# Patient Record
Sex: Male | Born: 1970 | Race: White | Hispanic: No | Marital: Married | Smoking: Current every day smoker
Health system: Southern US, Community
[De-identification: ages and names within clinical notes are randomized; demographics above are authoritative.]

## PROBLEM LIST (undated history)

## (undated) DIAGNOSIS — F32A Depression, unspecified: Secondary | ICD-10-CM

## (undated) DIAGNOSIS — T1491XA Suicide attempt, initial encounter: Secondary | ICD-10-CM

## (undated) DIAGNOSIS — Z789 Other specified health status: Secondary | ICD-10-CM

## (undated) DIAGNOSIS — M25569 Pain in unspecified knee: Secondary | ICD-10-CM

## (undated) DIAGNOSIS — J449 Chronic obstructive pulmonary disease, unspecified: Secondary | ICD-10-CM

## (undated) DIAGNOSIS — I499 Cardiac arrhythmia, unspecified: Secondary | ICD-10-CM

## (undated) DIAGNOSIS — M4800 Spinal stenosis, site unspecified: Secondary | ICD-10-CM

## (undated) DIAGNOSIS — F319 Bipolar disorder, unspecified: Secondary | ICD-10-CM

## (undated) DIAGNOSIS — J45909 Unspecified asthma, uncomplicated: Secondary | ICD-10-CM

## (undated) DIAGNOSIS — R001 Bradycardia, unspecified: Secondary | ICD-10-CM

## (undated) DIAGNOSIS — F419 Anxiety disorder, unspecified: Secondary | ICD-10-CM

## (undated) DIAGNOSIS — I82409 Acute embolism and thrombosis of unspecified deep veins of unspecified lower extremity: Secondary | ICD-10-CM

## (undated) DIAGNOSIS — M1990 Unspecified osteoarthritis, unspecified site: Secondary | ICD-10-CM

## (undated) DIAGNOSIS — G4730 Sleep apnea, unspecified: Secondary | ICD-10-CM

## (undated) DIAGNOSIS — I77810 Thoracic aortic ectasia: Secondary | ICD-10-CM

## (undated) DIAGNOSIS — I10 Essential (primary) hypertension: Secondary | ICD-10-CM

## (undated) DIAGNOSIS — G4700 Insomnia, unspecified: Secondary | ICD-10-CM

## (undated) DIAGNOSIS — G8929 Other chronic pain: Secondary | ICD-10-CM

## (undated) DIAGNOSIS — Z87442 Personal history of urinary calculi: Secondary | ICD-10-CM

## (undated) HISTORY — DX: Bradycardia, unspecified: R00.1

## (undated) HISTORY — PX: MULTIPLE TOOTH EXTRACTIONS: SHX2053

## (undated) HISTORY — DX: Insomnia, unspecified: G47.00

## (undated) HISTORY — DX: Thoracic aortic ectasia: I77.810

## (undated) HISTORY — PX: FOOT SURGERY: SHX648

---

## 1999-02-14 ENCOUNTER — Emergency Department (HOSPITAL_COMMUNITY): Admission: EM | Admit: 1999-02-14 | Discharge: 1999-02-14 | Payer: Self-pay | Admitting: Emergency Medicine

## 1999-03-02 ENCOUNTER — Emergency Department (HOSPITAL_COMMUNITY): Admission: EM | Admit: 1999-03-02 | Discharge: 1999-03-02 | Payer: Self-pay | Admitting: Internal Medicine

## 1999-03-02 ENCOUNTER — Encounter: Payer: Self-pay | Admitting: Internal Medicine

## 1999-03-04 ENCOUNTER — Emergency Department (HOSPITAL_COMMUNITY): Admission: EM | Admit: 1999-03-04 | Discharge: 1999-03-04 | Payer: Self-pay | Admitting: Emergency Medicine

## 1999-03-07 ENCOUNTER — Emergency Department (HOSPITAL_COMMUNITY): Admission: EM | Admit: 1999-03-07 | Discharge: 1999-03-07 | Payer: Self-pay | Admitting: Emergency Medicine

## 2001-09-08 ENCOUNTER — Emergency Department (HOSPITAL_COMMUNITY): Admission: EM | Admit: 2001-09-08 | Discharge: 2001-09-08 | Payer: Self-pay | Admitting: Emergency Medicine

## 2002-03-27 ENCOUNTER — Encounter: Payer: Self-pay | Admitting: *Deleted

## 2002-03-27 ENCOUNTER — Emergency Department (HOSPITAL_COMMUNITY): Admission: EM | Admit: 2002-03-27 | Discharge: 2002-03-27 | Payer: Self-pay | Admitting: *Deleted

## 2002-04-27 ENCOUNTER — Emergency Department (HOSPITAL_COMMUNITY): Admission: EM | Admit: 2002-04-27 | Discharge: 2002-04-27 | Payer: Self-pay | Admitting: Emergency Medicine

## 2002-12-03 ENCOUNTER — Emergency Department (HOSPITAL_COMMUNITY): Admission: EM | Admit: 2002-12-03 | Discharge: 2002-12-03 | Payer: Self-pay | Admitting: Emergency Medicine

## 2002-12-22 ENCOUNTER — Emergency Department (HOSPITAL_COMMUNITY): Admission: EM | Admit: 2002-12-22 | Discharge: 2002-12-22 | Payer: Self-pay | Admitting: Emergency Medicine

## 2003-01-05 ENCOUNTER — Encounter: Payer: Self-pay | Admitting: *Deleted

## 2003-01-05 ENCOUNTER — Emergency Department (HOSPITAL_COMMUNITY): Admission: EM | Admit: 2003-01-05 | Discharge: 2003-01-06 | Payer: Self-pay | Admitting: *Deleted

## 2003-03-27 ENCOUNTER — Emergency Department (HOSPITAL_COMMUNITY): Admission: EM | Admit: 2003-03-27 | Discharge: 2003-03-27 | Payer: Self-pay | Admitting: Emergency Medicine

## 2003-04-26 ENCOUNTER — Encounter: Payer: Self-pay | Admitting: *Deleted

## 2003-04-26 ENCOUNTER — Emergency Department (HOSPITAL_COMMUNITY): Admission: EM | Admit: 2003-04-26 | Discharge: 2003-04-26 | Payer: Self-pay | Admitting: *Deleted

## 2003-08-04 ENCOUNTER — Emergency Department (HOSPITAL_COMMUNITY): Admission: EM | Admit: 2003-08-04 | Discharge: 2003-08-04 | Payer: Self-pay | Admitting: Emergency Medicine

## 2003-10-27 ENCOUNTER — Emergency Department (HOSPITAL_COMMUNITY): Admission: EM | Admit: 2003-10-27 | Discharge: 2003-10-27 | Payer: Self-pay | Admitting: Emergency Medicine

## 2003-10-31 ENCOUNTER — Emergency Department (HOSPITAL_COMMUNITY): Admission: EM | Admit: 2003-10-31 | Discharge: 2003-10-31 | Payer: Self-pay | Admitting: Emergency Medicine

## 2004-09-03 ENCOUNTER — Emergency Department (HOSPITAL_COMMUNITY): Admission: EM | Admit: 2004-09-03 | Discharge: 2004-09-03 | Payer: Self-pay | Admitting: Emergency Medicine

## 2005-06-26 ENCOUNTER — Emergency Department (HOSPITAL_COMMUNITY): Admission: EM | Admit: 2005-06-26 | Discharge: 2005-06-26 | Payer: Self-pay | Admitting: Emergency Medicine

## 2005-09-30 ENCOUNTER — Emergency Department (HOSPITAL_COMMUNITY): Admission: EM | Admit: 2005-09-30 | Discharge: 2005-09-30 | Payer: Self-pay | Admitting: Emergency Medicine

## 2005-10-06 ENCOUNTER — Emergency Department (HOSPITAL_COMMUNITY): Admission: EM | Admit: 2005-10-06 | Discharge: 2005-10-06 | Payer: Self-pay | Admitting: Emergency Medicine

## 2008-06-03 ENCOUNTER — Emergency Department (HOSPITAL_COMMUNITY): Admission: EM | Admit: 2008-06-03 | Discharge: 2008-06-03 | Payer: Self-pay | Admitting: Emergency Medicine

## 2008-07-13 ENCOUNTER — Emergency Department (HOSPITAL_COMMUNITY): Admission: EM | Admit: 2008-07-13 | Discharge: 2008-07-13 | Payer: Self-pay | Admitting: Internal Medicine

## 2008-10-28 ENCOUNTER — Emergency Department (HOSPITAL_COMMUNITY): Admission: EM | Admit: 2008-10-28 | Discharge: 2008-10-28 | Payer: Self-pay | Admitting: Emergency Medicine

## 2008-10-29 ENCOUNTER — Inpatient Hospital Stay (HOSPITAL_COMMUNITY): Admission: EM | Admit: 2008-10-29 | Discharge: 2008-11-09 | Payer: Self-pay | Admitting: Emergency Medicine

## 2009-03-17 ENCOUNTER — Emergency Department (HOSPITAL_COMMUNITY): Admission: EM | Admit: 2009-03-17 | Discharge: 2009-03-17 | Payer: Self-pay | Admitting: Emergency Medicine

## 2009-07-16 ENCOUNTER — Emergency Department (HOSPITAL_COMMUNITY): Admission: EM | Admit: 2009-07-16 | Discharge: 2009-07-16 | Payer: Self-pay | Admitting: Emergency Medicine

## 2009-08-01 ENCOUNTER — Encounter (HOSPITAL_COMMUNITY): Admission: RE | Admit: 2009-08-01 | Discharge: 2009-08-31 | Payer: Self-pay | Admitting: Orthopaedic Surgery

## 2009-10-03 ENCOUNTER — Encounter (HOSPITAL_COMMUNITY): Admission: RE | Admit: 2009-10-03 | Discharge: 2009-10-18 | Payer: Self-pay | Admitting: Orthopaedic Surgery

## 2009-12-31 ENCOUNTER — Emergency Department (HOSPITAL_COMMUNITY): Admission: EM | Admit: 2009-12-31 | Discharge: 2009-12-31 | Payer: Self-pay | Admitting: Emergency Medicine

## 2010-01-16 ENCOUNTER — Encounter (HOSPITAL_COMMUNITY): Admission: RE | Admit: 2010-01-16 | Discharge: 2010-02-15 | Payer: Self-pay | Admitting: Orthopaedic Surgery

## 2010-04-05 ENCOUNTER — Ambulatory Visit: Payer: Self-pay | Admitting: Orthopedic Surgery

## 2010-04-05 ENCOUNTER — Ambulatory Visit (HOSPITAL_COMMUNITY): Admission: EM | Admit: 2010-04-05 | Discharge: 2010-04-05 | Payer: Self-pay | Admitting: Emergency Medicine

## 2010-04-17 ENCOUNTER — Encounter (HOSPITAL_COMMUNITY): Admission: RE | Admit: 2010-04-17 | Discharge: 2010-05-17 | Payer: Self-pay | Admitting: Orthopaedic Surgery

## 2010-04-17 ENCOUNTER — Ambulatory Visit: Payer: Self-pay | Admitting: Orthopedic Surgery

## 2010-04-17 DIAGNOSIS — IMO0002 Reserved for concepts with insufficient information to code with codable children: Secondary | ICD-10-CM | POA: Insufficient documentation

## 2010-05-01 ENCOUNTER — Emergency Department (HOSPITAL_COMMUNITY): Admission: EM | Admit: 2010-05-01 | Discharge: 2010-05-01 | Payer: Self-pay | Admitting: Emergency Medicine

## 2010-05-07 ENCOUNTER — Ambulatory Visit: Payer: Self-pay | Admitting: Orthopedic Surgery

## 2010-05-28 ENCOUNTER — Emergency Department (HOSPITAL_COMMUNITY): Admission: EM | Admit: 2010-05-28 | Discharge: 2010-05-28 | Payer: Self-pay | Admitting: Emergency Medicine

## 2010-05-30 ENCOUNTER — Emergency Department (HOSPITAL_COMMUNITY)
Admission: EM | Admit: 2010-05-30 | Discharge: 2010-05-30 | Payer: Self-pay | Source: Home / Self Care | Admitting: Emergency Medicine

## 2010-05-31 ENCOUNTER — Encounter: Payer: Self-pay | Admitting: Orthopedic Surgery

## 2010-05-31 ENCOUNTER — Emergency Department (HOSPITAL_COMMUNITY): Admission: EM | Admit: 2010-05-31 | Discharge: 2010-05-31 | Payer: Self-pay | Admitting: Internal Medicine

## 2010-06-05 ENCOUNTER — Ambulatory Visit: Payer: Self-pay | Admitting: Orthopedic Surgery

## 2010-06-05 DIAGNOSIS — S9030XA Contusion of unspecified foot, initial encounter: Secondary | ICD-10-CM | POA: Insufficient documentation

## 2010-06-07 ENCOUNTER — Telehealth (INDEPENDENT_AMBULATORY_CARE_PROVIDER_SITE_OTHER): Payer: Self-pay | Admitting: *Deleted

## 2010-06-08 ENCOUNTER — Telehealth: Payer: Self-pay | Admitting: Orthopedic Surgery

## 2010-06-08 ENCOUNTER — Emergency Department (HOSPITAL_COMMUNITY): Admission: EM | Admit: 2010-06-08 | Discharge: 2010-06-08 | Payer: Self-pay | Admitting: Emergency Medicine

## 2010-06-21 ENCOUNTER — Telehealth: Payer: Self-pay | Admitting: Orthopedic Surgery

## 2010-06-21 ENCOUNTER — Ambulatory Visit: Payer: Self-pay | Admitting: Orthopedic Surgery

## 2010-06-21 DIAGNOSIS — G5600 Carpal tunnel syndrome, unspecified upper limb: Secondary | ICD-10-CM | POA: Insufficient documentation

## 2010-06-28 ENCOUNTER — Emergency Department (HOSPITAL_COMMUNITY): Admission: EM | Admit: 2010-06-28 | Discharge: 2010-06-28 | Payer: Self-pay | Admitting: Emergency Medicine

## 2010-06-28 ENCOUNTER — Telehealth: Payer: Self-pay | Admitting: Orthopedic Surgery

## 2010-09-07 ENCOUNTER — Emergency Department (HOSPITAL_COMMUNITY): Admission: EM | Admit: 2010-09-07 | Discharge: 2010-09-07 | Payer: Self-pay | Admitting: Emergency Medicine

## 2010-11-22 NOTE — Assessment & Plan Note (Signed)
Summary: left foot still hurting some/post op 04/05/10/medicaid/bsf   Visit Type:  post op Referring Provider:  ap er fu  CC:  post op left foot.  History of Present Illness: I saw Lance Cohen in the office today for a followup visit.  He is a 40 years old man with the complaint of:  continued pain in his LEFT foot after a foreign body was removed.  post op  removal of foreign body left foot, a pin.  DOS 04/05/10.  Clindamycin 300mg  q 6 hrs , he has finished the meds.  Today is in for recheck.  He is still having pain.  Physical Exam  Extremities:  Charod has tenderness over the incision with a positive Tinel's and numbness and tingling in the first webspace.  There no signs of infection.   Impression & Recommendations:  Problem # 1:  AFTERCARE FOLLOW SURGERY MUSCULOSKEL SYSTEM NEC (ICD-V58.78) Assessment Comment Only I think he has a neuroma I put him on gabapentin and gave him Norco 7.5 for pain to take for 6 weeks.  I next step if he continues to have symptoms with the injection  Problem # 2:  FOREIGN BODY, FOOT (ICD-917.6) Assessment: Improved  Orders: Est. Patient Level II (14782)  Medications Added to Medication List This Visit: 1)  Gabapentin 100 Mg Caps (Gabapentin) .Marland Kitchen.. 1 po tid 2)  Norco 7.5-325 Mg Tabs (Hydrocodone-acetaminophen) .Marland Kitchen.. 1 q 4 as needed pain  Patient Instructions: 1)  Take the 2 medication for 6 weeks  2)  Follow as needed  Prescriptions: NORCO 7.5-325 MG TABS (HYDROCODONE-ACETAMINOPHEN) 1 q 4 as needed pain  #84 x 2   Entered and Authorized by:   Fuller Canada MD   Signed by:   Fuller Canada MD on 05/07/2010   Method used:   Print then Give to Patient   RxID:   [id number] GABAPENTIN 100 MG CAPS (GABAPENTIN) 1 po tid  #60 x 1   Entered and Authorized by:   Fuller Canada MD   Signed by:   Fuller Canada MD on 05/07/2010   Method used:   Print then Give to Patient   RxID:   [id number]

## 2010-11-22 NOTE — Assessment & Plan Note (Signed)
Summary: POST OP 1 SUTURE REMOVAL LEFT FOOT/BSF   Visit Type:  Follow-up Referring Provider:  ap er fu  CC:  post op 1.  History of Present Illness: I saw Lance Cohen in the office today for a followup visit.  He is a 40 years old man with the complaint of:  post op 1 from er removal of foreign body left foot, a pin.  DOS 04/05/10.  POD 12.  Clindamycin 300mg  q 6 hrs for 10 days, has not finished yet, should have been finished.  Today is in for recheck.  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  See no need for any long-term problems should resolve over a month mostly likely he has a neuropraxia        Impression & Recommendations:  Problem # 1:  AFTERCARE FOLLOW SURGERY MUSCULOSKEL SYSTEM NEC (ICD-V58.78) Assessment Improved  Orders: Post-Op Check (91478)  Problem # 2:  FOREIGN BODY, FOOT (ICD-917.6) Assessment: Improved  Orders: Post-Op Check (29562)  Medications Added to Medication List This Visit: 1)  Percocet 5-325 Mg Tabs (Oxycodone-acetaminophen) .Marland Kitchen.. 1 q 4 as needed pain  Patient Instructions: 1)  take antibiotic until finished  2)  take percocet for pain  3)  follow up as needed  4)  numbness should stop in a month  Prescriptions: PERCOCET 5-325 MG TABS (OXYCODONE-ACETAMINOPHEN) 1 q 4 as needed pain  #90 x 0   Entered and Authorized by:   Fuller Canada MD   Signed by:   Fuller Canada MD on 04/17/2010   Method used:   Print then Give to Patient   RxID:   [id number]

## 2010-11-22 NOTE — Progress Notes (Signed)
Summary: call from patient   Phone Note Call from Patient   Caller: Patient Summary of Call: Patient has requested copy of his records, per phone 06/27/10, "for own purposes, due to someone shutting his foot in a door."  Said he still is following up here.  Came in 06/27/10 and signed authorization for record pickup. Called back today to check on referral to Dr Gerilyn Pilgrim.  Advised referral has been faxed. He gave his best contact ph again, Cell [phone]. Initial call taken by: Cammie Sickle,  June 28, 2010 8:49 AM

## 2010-11-22 NOTE — Assessment & Plan Note (Signed)
Summary: ER/foot injury/ xrays ap/frs   Visit Type:  Follow-up Referring Provider:  ap er fu  CC:  left foot pain.  History of Present Illness: I saw Lance Cohen in the office today for a followup visit.  He is a 40 years old man with the complaint of:  Severe left foot pain.  recently had a foreign body removed from his LEFT foot with a dorsal sensory nerve palsy  Presented back today because of a foot injury same foot sustained on August 10.  X-rays were negative.  Complains of dorsal and plantar foot pain.  X-rays show some soft tissue swelling dorsally no fracture  He now complains of severe pain which is unrelieved by Norco 5 mg.  He is in a postop shoe on crutches.  There is some bruising in the plantar portion of the midfoot   he denies any neurologic symptoms at this time other than the residual dorsal sensory hyperesthesias noted after the foreign body removal  Physical Exam  Additional Exam:  Lance Cohen comes in slightly with poor hygiene but that is his usual state, has normal perfusion to the foot area his skin excision from his farm body removal has healed.  He has hyperesthesias there and tenderness.  Has swelling in the midfoot plantarly with tenderness there.  Is not ambulating except with crutches.  I'll see her bruise other than the soft tissue swelling in the plantar portion of the foot.     Impression & Recommendations:  Problem # 1:  CONTUSION, LEFT FOOT (ICD-924.20) Assessment New reviewed x-ray report and x-ray and ER note for data reviewed.   application posterior splint patient advising her be nonweightbearing for 2 weeks and come back for recheck because he was in such severe pain we gave him Percocet for 2 week  Orders: Est. Patient Level III (04540)  Patient Instructions: 1)  return in 2 weeks 2)  no weight on left foot Prescriptions: PERCOCET 5-325 MG TABS (OXYCODONE-ACETAMINOPHEN) 1 q 4 as needed pain  #84 x 0   Entered and Authorized by:    Fuller Canada MD   Signed by:   Fuller Canada MD on 06/05/2010   Method used:   Print then Give to Patient   RxID:   [id number]

## 2010-11-22 NOTE — Progress Notes (Signed)
Summary: questions about patient's instructions  Phone Note Call from Patient   Caller: Patient Summary of Call: Patient called to re-check on instructions from his visit 06/05/10 re: weight-bearing.  Is there another option to crutches or "anything that will make it stop hurting?"  cell ph# [phone] Initial call taken by: Cammie Sickle,  June 07, 2010 8:49 AM  Follow-up for Phone Call        called and advises Percocet to last for 2 weeks, add ibuprofen 800mg  three times a day and no weightbearing, use crutches or walker. Follow-up by: Ether Griffins,  June 07, 2010 9:31 AM

## 2010-11-22 NOTE — Progress Notes (Signed)
Summary: NCS referral.  Phone Note Outgoing Call   Call placed by: Waldon Reining,  June 21, 2010 2:50 PM Call placed to: Specialist Action Taken: Information Sent Summary of Call: I faxed a referral for this patient to Dr. Gerilyn Pilgrim for a NCS of his upper extremities.

## 2010-11-22 NOTE — Assessment & Plan Note (Signed)
Summary: 2 WK RE-CK WOUND/FOOT/[place] MED[place]/CAF   Visit Type:  Follow-up Referring Provider:  ap er fu  CC:  recheck foot.  History of Present Illness: I saw Lance Cohen in the office today for a followup visit.  He is a 40 years old man with the complaint of:  Severe left foot pain.  recently had a foreign body removed from his LEFT foot with a dorsal sensory nerve palsy  Foot feels better, had splint on, has been walking on it.   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  recommend carpal tunnel testing RIGHT upper extremity for carpal tunnel syndrome.  As far as his foot goes still has a Tinel's over the wound. The dorsum of the foot. He has decreased swelling in the foot, but her range of motion decrease tenderness.  He can ambulate as tolerated. He can stop using, crutches. We'll see him after he has his carpal tunnel test.  Allergies (verified): 1)  ! Penicillin   Other Orders: Est. Patient Level III (16109)  Patient Instructions: 1)  Nerve conduction study [MCAID] THEN RETURN 2 WEEKS AFTER THE STUDY IS DONE WITHT HE REPORT ON THE CHART

## 2010-11-22 NOTE — Progress Notes (Signed)
Summary: call from Lance Cohen  Phone Note Call from Patient   Summary of Call: Lance Cohen called again asking  how he is supposed to get around, says he cannot use crutches or a walker because he has carpal tunnel and it hurts his hands.  Asked him if a wheel chair will help and he said he cannot use one due to where he lives.  [place] Spaniel he is not worried about "pill's"  just wants to know how he is supposed to get around with no weightbearing.  Before hanging up he said he is just going to the ER because his stomach and foot is killing him. Initial call taken by: Jacklynn Ganong,  June 08, 2010 8:41 AM

## 2011-01-06 LAB — COMPREHENSIVE METABOLIC PANEL
ALT: 14 U/L (ref 0–53)
AST: 18 U/L (ref 0–37)
Albumin: 3.8 g/dL (ref 3.5–5.2)
Alkaline Phosphatase: 82 U/L (ref 39–117)
GFR calc Af Amer: >60 mL/min (ref >60)
Potassium: 4 mEq/L (ref 3.5–5.1)
Sodium: 138 mEq/L (ref 135–145)
Total Protein: 7 g/dL (ref 6.0–8.3)

## 2011-01-06 LAB — DIFFERENTIAL
Basophils Relative: 1 % (ref 0–1)
Eosinophils Absolute: 0.1 10*3/uL (ref 0.0–0.7)
Lymphs Abs: 2 10*3/uL (ref 0.7–4.0)
Monocytes Absolute: 0.5 10*3/uL (ref 0.1–1.0)
Monocytes Relative: 5 % (ref 3–12)

## 2011-01-06 LAB — CBC
Hemoglobin: 16 g/dL (ref 13.0–17.0)
Platelets: 221 10*3/uL (ref 150–400)
RDW: 14.1 % (ref 11.5–15.5)

## 2011-01-25 LAB — RAPID URINE DRUG SCREEN, HOSP PERFORMED
Amphetamines: NOT DETECTED
Tetrahydrocannabinol: POSITIVE — AB

## 2011-01-25 LAB — URINALYSIS, ROUTINE W REFLEX MICROSCOPIC
Ketones, ur: NEGATIVE mg/dL
Nitrite: NEGATIVE
Specific Gravity, Urine: >1.03 — ABNORMAL HIGH (ref 1.005–1.030)
Urobilinogen, UA: 0.2 mg/dL (ref 0.0–1.0)
pH: 6 (ref 5.0–8.0)

## 2011-01-29 LAB — BASIC METABOLIC PANEL
BUN: 9 mg/dL (ref 6–23)
Calcium: 8.6 mg/dL (ref 8.4–10.5)
GFR calc non Af Amer: 50 mL/min — ABNORMAL LOW (ref >60)
Glucose, Bld: 91 mg/dL (ref 70–99)
Potassium: 4.7 mEq/L (ref 3.5–5.1)
Sodium: 140 mEq/L (ref 135–145)

## 2011-01-29 LAB — DIFFERENTIAL
Basophils Absolute: 0 10*3/uL (ref 0.0–0.1)
Eosinophils Relative: 2 % (ref 0–5)
Lymphocytes Relative: 28 % (ref 12–46)
Lymphs Abs: 1.7 10*3/uL (ref 0.7–4.0)
Neutro Abs: 3.9 10*3/uL (ref 1.7–7.7)

## 2011-01-29 LAB — CBC
HCT: 41.6 % (ref 39.0–52.0)
Platelets: 234 10*3/uL (ref 150–400)
RDW: 12.9 % (ref 11.5–15.5)
WBC: 6.3 10*3/uL (ref 4.0–10.5)

## 2011-01-29 LAB — RAPID URINE DRUG SCREEN, HOSP PERFORMED
Amphetamines: NOT DETECTED
Barbiturates: NOT DETECTED
Benzodiazepines: NOT DETECTED
Cocaine: POSITIVE — AB
Opiates: NOT DETECTED

## 2011-01-29 LAB — LITHIUM LEVEL: Lithium Lvl: 0.31 mEq/L — ABNORMAL LOW (ref 0.80–1.40)

## 2011-01-29 LAB — POCT CARDIAC MARKERS: CKMB, poc: <1 ng/mL — ABNORMAL LOW (ref 1.0–8.0)

## 2011-02-04 LAB — DIFFERENTIAL
Basophils Relative: 0 % (ref 0–1)
Eosinophils Absolute: 0 10*3/uL (ref 0.0–0.7)
Eosinophils Absolute: 0.5 10*3/uL (ref 0.0–0.7)
Eosinophils Relative: 6 % — ABNORMAL HIGH (ref 0–5)
Lymphocytes Relative: 23 % (ref 12–46)
Lymphs Abs: 1.9 10*3/uL (ref 0.7–4.0)
Monocytes Relative: 2 % — ABNORMAL LOW (ref 3–12)
Monocytes Relative: 8 % (ref 3–12)
Neutrophils Relative %: 73 % (ref 43–77)

## 2011-02-04 LAB — CBC
MCHC: 34.4 g/dL (ref 30.0–36.0)
MCHC: 34.8 g/dL (ref 30.0–36.0)
MCV: 100.7 fL — ABNORMAL HIGH (ref 78.0–100.0)
MCV: 101 fL — ABNORMAL HIGH (ref 78.0–100.0)
Platelets: 191 10*3/uL (ref 150–400)
RBC: 3.86 MIL/uL — ABNORMAL LOW (ref 4.22–5.81)
RBC: 4.42 MIL/uL (ref 4.22–5.81)
RDW: 13.3 % (ref 11.5–15.5)

## 2011-02-04 LAB — CK TOTAL AND CKMB (NOT AT ARMC)
Relative Index: 0.8 (ref 0.0–2.5)
Total CK: 134 U/L (ref 7–232)

## 2011-02-04 LAB — HEPATIC FUNCTION PANEL
ALT: 20 U/L (ref 0–53)
AST: 30 U/L (ref 0–37)
Albumin: 3.7 g/dL (ref 3.5–5.2)
Albumin: 3.7 g/dL (ref 3.5–5.2)
Alkaline Phosphatase: 59 U/L (ref 39–117)
Bilirubin, Direct: <0.1 mg/dL (ref 0.0–0.3)
Indirect Bilirubin: 0.7 mg/dL (ref 0.3–0.9)
Total Bilirubin: 0.5 mg/dL (ref 0.3–1.2)
Total Bilirubin: 0.8 mg/dL (ref 0.3–1.2)
Total Protein: 6.9 g/dL (ref 6.0–8.3)

## 2011-02-04 LAB — RAPID URINE DRUG SCREEN, HOSP PERFORMED
Amphetamines: NOT DETECTED
Benzodiazepines: POSITIVE — AB
Cocaine: NOT DETECTED
Cocaine: NOT DETECTED
Opiates: NOT DETECTED
Tetrahydrocannabinol: POSITIVE — AB
Tetrahydrocannabinol: POSITIVE — AB

## 2011-02-04 LAB — BASIC METABOLIC PANEL
BUN: 10 mg/dL (ref 6–23)
BUN: 20 mg/dL (ref 6–23)
CO2: 24 mEq/L (ref 19–32)
CO2: 29 mEq/L (ref 19–32)
Calcium: 9.7 mg/dL (ref 8.4–10.5)
Chloride: 110 mEq/L (ref 96–112)
Creatinine, Ser: 1.16 mg/dL (ref 0.4–1.5)
GFR calc Af Amer: >60 mL/min (ref >60)
GFR calc non Af Amer: >60 mL/min (ref >60)
Glucose, Bld: 93 mg/dL (ref 70–99)
Potassium: 3.4 mEq/L — ABNORMAL LOW (ref 3.5–5.1)
Sodium: 139 mEq/L (ref 135–145)

## 2011-02-04 LAB — COMPREHENSIVE METABOLIC PANEL
ALT: 14 U/L (ref 0–53)
AST: 20 U/L (ref 0–37)
Albumin: 3.7 g/dL (ref 3.5–5.2)
CO2: 28 mEq/L (ref 19–32)
Calcium: 9.5 mg/dL (ref 8.4–10.5)
Creatinine, Ser: 1.37 mg/dL (ref 0.4–1.5)
GFR calc Af Amer: >60 mL/min (ref >60)
Sodium: 137 mEq/L (ref 135–145)
Total Protein: 6.8 g/dL (ref 6.0–8.3)

## 2011-02-04 LAB — VALPROIC ACID LEVEL
Valproic Acid Lvl: 127.5 ug/mL — ABNORMAL HIGH (ref 50.0–100.0)
Valproic Acid Lvl: 214 ug/mL (ref 50.0–100.0)
Valproic Acid Lvl: 596 ug/mL (ref 50.0–100.0)
Valproic Acid Lvl: 606 ug/mL (ref 50.0–100.0)

## 2011-02-04 LAB — TROPONIN I: Troponin I: 0.01 ng/mL (ref 0.00–0.06)

## 2011-02-04 LAB — URINALYSIS, ROUTINE W REFLEX MICROSCOPIC
Bilirubin Urine: NEGATIVE
Glucose, UA: NEGATIVE mg/dL
Ketones, ur: 15 mg/dL — AB
Leukocytes, UA: NEGATIVE

## 2011-02-04 LAB — AMMONIA: Ammonia: 45 umol/L — ABNORMAL HIGH (ref 11–35)

## 2011-02-04 LAB — ACETAMINOPHEN LEVEL
Acetaminophen (Tylenol), Serum: <10 ug/mL — ABNORMAL LOW (ref 10–30)
Acetaminophen (Tylenol), Serum: <10 ug/mL — ABNORMAL LOW (ref 10–30)

## 2011-02-04 LAB — CARDIAC PANEL(CRET KIN+CKTOT+MB+TROPI)
CK, MB: 1.1 ng/mL (ref 0.3–4.0)
Relative Index: 0.9 (ref 0.0–2.5)
Total CK: 119 U/L (ref 7–232)
Troponin I: 0.01 ng/mL (ref 0.00–0.06)

## 2011-02-04 LAB — SALICYLATE LEVEL: Salicylate Lvl: <4 mg/dL (ref 2.8–20.0)

## 2011-03-05 NOTE — Discharge Summary (Signed)
NAMESAVON, [name] NO.:  [id number]   MEDICAL RECORD NO.:  [id number]          PATIENT TYPE:  INP   LOCATION:  A317                          FACILITY:  [facility]   PHYSICIAN:  Margaretmary Dys, M.D.DATE OF BIRTH:  [birth year]   DATE OF ADMISSION:  10/29/2008  DATE OF DISCHARGE:  01/14/2010LH                               DISCHARGE SUMMARY   DISCHARGE DIAGNOSES:  1. Intentional drug overdose with toxic levels of valproic acid.  2. History of severe bipolar disorder.  3. Major depressive episode.  4. History of chronic tobacco abuse.  5. History of severe aggressiveness and behavioral problems.  6. Parasuicide process.  7. History of polysubstance abuse.   DISPOSITION:  The patient is being transferred to a psychiatric facility  for involuntary commitment for reasons mentioned above.   DISCHARGE MEDICATIONS:  The patient is currently not taking his  medications but he is chronically on:  1. Lamictal 100 mg two tablets every morning.  2. Diazepam 10 mg two tablets at bedtime.  3. Depakote 500 mg every morning and then 1000 mg at bedtime.   ACTIVITIES:  As recommended by the accepting facility.   DIET:  Regular diet.   SPECIAL PRECAUTIONS:  The patient is very aggressive and belligerent and  may need significant restraining to be able to restrain him.   PERTINENT LABORATORY DATA:  On the day of admission white blood cell  count 8.3, hemoglobin 15.3, hematocrit 44, platelet count was 191.  Sodium 139, potassium 3.4, chloride 104, CO2 was 29, glucose was 93, BUN  of 10, creatinine was 1.288.  Valproic acid level was 214.  His urine  drug screen was positive for benzodiazepines and for cannabis.  His  valproic acid level during the course of hospitalization however went as  high as 606, last level checked 2 days ago was 49.  Urinalysis was  unremarkable.   HOSPITAL COURSE:  Lance Cohen is a 40 year old male who was brought into  the hospital on October 29, 2008,  for medical clearance.  The patient had  reported taking a nap early and he woke up and his supply of Valium was  missing.  The patient called the police to file a report.  They advised  him to come to the emergency room.  The patient goes to Dr. Betti Cruz who  had given him the prescription.  The patient denies overdosing on the  medication.  The patient was asked if he was suicidal or homicidal but  the patient became very angry and difficult.  It was then determined  that the patient had apparently been taking the medications on his own.  The patient had previously been admitted with an overdose in the past.  The patient's family noted multiples were missing and he had taken 90 of  his Depakote pills which was consistent with his drug level.  The  patient had been depressed and angry prior to this.   Physical examination was really unremarkable.  The patient was somewhat  drowsy, although arousable during exam.  His blood pressure was 122/74  with a pulse of 85,  respiration was 18, temperature 97.3.  The patient  was very belligerent and very difficult.  The patient was noted to have  multiple tattoos over the body.  The patient was subsequently admitted  to the hospital, was given IV fluid hydration, was monitored on  telemetry.  The patient did not have any significant problems including  hypertension or arrhythmias and his mild renal insufficiency improved  with hydration.  The patient is now being transferred to facility for  involuntary commitment.      Margaretmary Dys, M.D.  Electronically Signed     AM/MEDQ  D:  11/03/2008  T:  11/03/2008  Job:  [id number]

## 2011-03-05 NOTE — Group Therapy Note (Signed)
NAMEDONYE, CAMPANELLI NO.:  [id number]   MEDICAL RECORD NO.:  [id number]          PATIENT TYPE:  INP   LOCATION:  A317                          FACILITY:  [facility]   PHYSICIAN:  Margaretmary Dys, M.D.DATE OF BIRTH:  12-May-1971   DATE OF PROCEDURE:  11/05/2008  DATE OF DISCHARGE:                                 PROGRESS NOTE   SUBJECTIVE:  Patient remains about the same.  We are currently awaiting  transfer to the institution for involuntary commitment.  Patient has  been somewhat calm and appropriate thus far.  We will remove his Foley  catheter today.   OBJECTIVE:  Conscious, alert, well oriented in time, place, and person.  VITAL SIGNS:  Blood pressure is 104/59.  Pulse 37.  Respirations 20.  Temperature 98 degrees Fahrenheit.  Oxygen saturation was 97% on room  air.  HEENT EXAM:  Normocephalic, atraumatic.  Oral mucous membranes moist.  No exudates.  NECK:  Supple.  No JVD.  LUNGS:  Clear.  HEART:  S1 and S2 regular.  ABDOMEN:  Soft.  EXTREMITIES:  Show some bruising around his handcuffs.   ASSESSMENT ON LIMB MOVEMENT:  1. Intentional drug overdose with toxic level of valproic acid.  2. History of severe bipolar disorder.  3. Major depressive disorder.  4. History of polysubstance abuse.  5. History of severe aggressiveness and behavioral problems.  6. Parasuicide.   PLAN:  1. We will discontinue his Foley catheter today.  2. We will restart his Depakote at 500 mg every morning and 1000 mg at      bedtime.  3. We are waiting transfer to an institution for placement and      counseling.      Margaretmary Dys, M.D.  Electronically Signed     AM/MEDQ  D:  11/05/2008  T:  11/05/2008  Job:  [id number]

## 2011-03-05 NOTE — Group Therapy Note (Signed)
NAMESECUNDINO, ELLITHORPE NO.:  [id number]   MEDICAL RECORD NO.:  [id number]          PATIENT TYPE:  INP   LOCATION:  A317                          FACILITY:  [facility]   PHYSICIAN:  Margaretmary Dys, M.D.DATE OF BIRTH:  July 13, 1971   DATE OF PROCEDURE:  11/02/2008  DATE OF DISCHARGE:                                 PROGRESS NOTE   SUBJECTIVE:  The patient remains comfortable at this time.  The patient  was sleeping and was sedated when arrived in his room.  The patient has  been very combative.  The patient was admitted with a history of  overdose with Depakote.  His valproic acid level was greater than 600,  near-toxic range.  The patient's level was down to 49 yesterday.  The  patient has been medically cleared for transfer to a psychiatric  hospital once a bed is available.   OBJECTIVE:  The patient was sleepy, was comfortable.  VITAL SIGNS:  Blood pressure is 144/85, pulse of 86, respirations 20,  temperature 97.8, oxygen saturation was 95% on room air.  HEENT:  Normocephalic, atraumatic.  Oral mucosa was dry.  NECK:  Supple.  No JVD.  LUNGS:  Clear.  HEART:  S1-S2.  ABDOMEN:  Soft.  EXTREMITIES: No edema.   ASSESSMENT/PLAN:  1. Drug overdose with valproic acid in the toxic range.  2. History of severe bipolar disorder.  3. Major depressive episode.  4. History of chronic tobacco abuse.  5. History of severe aggressiveness and behavior problems.  6. Parasuicide  7. History of polysubstance abuse.   PLAN:  1. We are awaiting transfer to a facility for psychiatric evaluation      and counseling.  2. Will see if we need to restart the patient's Depakote by tomorrow      if he does not go to this psychiatric center.  The patient remains      under guard by the Police.      Margaretmary Dys, M.D.  Electronically Signed    AM/MEDQ  D:  11/02/2008  T:  11/02/2008  Job:  [id number]

## 2011-03-05 NOTE — Discharge Summary (Signed)
NAMEZACHARIAS, Lance Cohen NO.:  [id number]   MEDICAL RECORD NO.:  [id number]          PATIENT TYPE:  INP   LOCATION:  A317                          FACILITY:  [facility]   PHYSICIAN:  Osvaldo Shipper, MD     DATE OF BIRTH:  18-Oct-1971   DATE OF ADMISSION:  10/29/2008  DATE OF DISCHARGE:  LH                               DISCHARGE SUMMARY   Please see H and P dictated by Dr. Dorris Singh for details regarding  patient's presenting illness.   DISCHARGE DIAGNOSES:  1. Depakote toxicity.  2. Intentional drug overdose.  3. Suicide attempt.  4. History of bipolar disorder.   BRIEF HOSPITAL COURSE:  Briefly, this is a 40 year old white male who  presented to the ED after he was sent over by EMS and police for medical  clearance.  He appeared to be agitated in the ED and he admitted to  having taken 90 tablets of Depakote.  When he was brought in and  evaluated, his initial Depakote level was found to be 31 and then it was  25 and then rose to 214 with a peak of 606, and it came down to  therapeutic level a couple of days ago.  The patient was admitted to the  hospital for medical clearance.  He remained stable and was finally  thought to be medically stable for discharge.  However, a bed at [facility] where he will need psychiatric care has not opened up.  I was  told that a bed might be available tomorrow so he will be sent over  there tomorrow.  In the meantime, the patient continues to be medically  stable.  He denies any complaints today but he is just frustrated that  he has not been able to go to that facility.   His vital signs are all stable.  Examination is benign.   DISCHARGE MEDICATIONS:  1. He is currently on Depakote 500 mg in the morning and 1000 at      night.  2. Lamictal 200 mg daily.  3. Valium 20 mg q.h.s.   DIET:  Regular.   PHYSICAL ACTIVITY:  No restrictions.   Once a bed is available at [facility], he can be discharged.  He  is  medically stable.      Osvaldo Shipper, MD  Electronically Signed     GK/MEDQ  D:  11/07/2008  T:  11/07/2008  Job:  [id number]

## 2011-03-05 NOTE — Discharge Summary (Signed)
NAMEJAVARI, BUFKIN NO.:  [id number]   MEDICAL RECORD NO.:  [id number]          PATIENT TYPE:  INP   LOCATION:  A317                          FACILITY:  [facility]   PHYSICIAN:  Skeet Latch, DO    DATE OF BIRTH:  [birth year]   DATE OF ADMISSION:  10/29/2008  DATE OF DISCHARGE:  LH                               DISCHARGE SUMMARY   ADDENDUM TO DISCHARGE SUMMARY November 07, 2008:   Patient continues to be stable.   DISCHARGE DIAGNOSES:  Remain same:   1. Depakote toxicity.  2. Intentional drug overdose.  3. Suicide attempt.  4. History of bipolar disorder.   DISCHARGE MEDICATIONS:  Remain the same:   1. Depakote 500 mg in the morning, 1000 at night.  2. Lamictal 200 mg daily.  3. Valium 20 mg q.h.s.   ACTIVITY:  No restrictions.   HOSPITAL COURSE:  This was a 40 year old male, who presented to the ED  by EMS and Police for medical clearance.  He was seen in the emergency  room, he admitted to having taken 90 tablets of Depakote.  He was  brought in and evaluated.  Initial Depakote level was found to be 31 and  has slowly decreased.  Patient was admitted for medical clearance.  Patient has remained in the hospital since date of admission, which was  October 29, 2008.  However, he has remained at [facility]  because they have not found a bed for him at [facility].  A bed is  now open.  Patient will be discharged at this time.  Patient continues  to have slight agitation during his hospital stay.  He is medically  stable and, again, ready to be discharged.      Skeet Latch, DO  Electronically Signed     SM/MEDQ  D:  11/09/2008  T:  11/09/2008  Job:  [id number]

## 2011-03-05 NOTE — Group Therapy Note (Signed)
Lance Cohen, BIALAS NO.:  [id number]   MEDICAL RECORD NO.:  [id number]          PATIENT TYPE:  INP   LOCATION:  A317                          FACILITY:  [facility]   PHYSICIAN:  Margaretmary Dys, M.D.DATE OF BIRTH:  1971/04/20   DATE OF PROCEDURE:  11/06/2008  DATE OF DISCHARGE:                                 PROGRESS NOTE   SUBJECTIVE:  Patient remains about the same.  He is doing okay.  He calm  and appropriate now.  Patient's Foley catheter has been removed.  I will  discontinue his IV fluids.   OBJECTIVE:  Conscious, alert, comfortable, not in acute distress.  Well-  oriented in time, place and person.  VITAL SIGNS:  Blood pressure is 94/48, pulse 59, respirations 16,  temperature 98.2 degrees Fahrenheit.  Oxygen saturation was 98% on room  air.  HEENT EXAM:  Normocephalic, atraumatic.  Oral mucosa was moist with no  exudates.  NECK:  Supple.  No JVD, no lymphadenopathy.  LUNGS:  Clear.  HEART:  S1-S2 regular.  ABDOMEN:  Soft.  EXTREMITIES:  Show some bruising around his left forearm from handcuff  trauma.   ASSESSMENT AND PLAN:  1. Intentional drug overdose with toxic level of valproic acid.  2. History of severe bipolar disorder.  3. Major depressive disorder.  4. History of polysubstance abuse.  5. History of severe aggressiveness and behavioral problems.  6. Parasuicide.   Plan:  1. Will discontinue the patient's IV fluids.  Will continue patient's      Depakote and Lamictal.  2. We are still awaiting transfer to an institution for involuntary      commitment and counseling.      Margaretmary Dys, M.D.  Electronically Signed     AM/MEDQ  D:  11/06/2008  T:  11/06/2008  Job:  [id number]

## 2011-03-05 NOTE — H&P (Signed)
[name], Cohen NO.:  [id number]   MEDICAL RECORD NO.:  [id number]          PATIENT TYPE:  INP   LOCATION:  IC01                          FACILITY:  [facility]   PHYSICIAN:  Dorris Singh, DO    DATE OF BIRTH:  [birth year]   DATE OF ADMISSION:  10/29/2008  DATE OF DISCHARGE:  LH                              HISTORY & PHYSICAL   HISTORY OF PRESENT ILLNESS:  The patient is a 40 year old male who  apparently was seen in the emergency room on October 28, 2008.  He came  for medical clearance.  He states that he had taken a nap earlier and he  woke up and his bottle of Valium was missing.  He called the police to  file a report and they advised him to come to the emergency room.  The  patient apparently goes to see Dr. Betti Cruz and he was given this  prescription.  He reported that he had been taking them as prescribed  and denies overdosing on the medication.  When he was asked if he was  suicidal or homicidal,  he stated I am pissed and I will take care of  this.  That is when it was determined that medical clearance would  start on that day.  It was determined that the patient was seen by the  ACT team and he was discharged to home.  He  was brought in again today,  on October 29, 2008, with an overdose of Depakote.  He was brought in via  EMS.  He has a level 5 caveat because of uncooperativeness and sedation.  The family had noticed that he had multiple pills missing and believe  that he had taken up to 90 of his Depakote pills.  The ingestion took  place prior to the family finding him and it was a known suicide attempt  per family.  The patient had been depressed and angry prior to this.   REVIEW OF SYSTEMS:  Are unable to achieve because of his level  5  caveat.   PAST MEDICAL HISTORY:  Apparently is significant for bipolar disorder.   FAMILY HISTORY:  He has cancer and CAD in his family history.   PAST SURGICAL HISTORY:  He denies any surgical history.   SOCIAL HISTORY:  He is a smoker; however, unsure of how much he smokes.   ALLERGIES:  He does have allergies to PENICILLIN.   CURRENT MEDICATIONS:  He is on:  1. Lamictal 100 mg 2 tablets every morning.  2. Diazepam 10 mg 2 tablets at bedtime.  3. Depakote 500 mg every morning and then 1000 mg at bedtime.   PHYSICAL EXAM:  GENERAL:  the patient was seen sleeping, however, he was  arousable during exam.  VITALS:  Were as follows:  Blood pressure 122/74, pulse rate 85,  respirations 18, temperature 97.3.  HEAD:  Normocephalic, atraumatic.  SKIN:  Multiple tattoos all over his body.  HEART:  Regular rate and rhythm.  LUNGS:  Clear to auscultation bilaterally.  ABDOMEN:  Soft, nontender, distended.  EXTREMITIES:  Positive pulses.  During interview, the patient is waking up.  He is not following  commands and belligerent.   LABS:  Are as follows:  His white count is 8.3, hemoglobin 15.3,  hematocrit 44.0 and platelet count of 191.  His sodium is 139, potassium  3.4, chloride 104, CO2 of 29 and glucose 93, BUN 10 and creatinine  1.288.  His Valproic acid is 214.  He is  positive for benzodiazepines  and for cannabis as well.   ASSESSMENT/PLAN:  1. Drug overdose with Depakote.  The plan with that is he will not      peak until 18 hours.  However, during the meantime he is      belligerent and combative so we will keep him as sedated as      possible.  We have also started involuntary commitment papers on      him and have law enforcement here to help [place].  We will recheck his      Valproic level as well over the next several hours to monitor him      and will continue with IV fluid hydration and deep venous      thrombosis prophylaxis.  2. Major depression bipolar episode.  Once he is alert and oriented,      we will have the ACT team come see him and assess him as to which      facility he will go to.  3. Tobacco abuse.  We will place a nicotine patch when the patient is      alert  and oriented.      Dorris Singh, DO  Electronically Signed     CB/MEDQ  D:  10/29/2008  T:  10/29/2008  Job:  [id number]   cc:   Daine Floras, M.D.  Fax: [phone]

## 2011-06-22 ENCOUNTER — Emergency Department (HOSPITAL_COMMUNITY)
Admission: EM | Admit: 2011-06-22 | Discharge: 2011-06-22 | Disposition: A | Discharge status: Home / Self Care | Payer: Medicaid Other | Attending: Emergency Medicine | Admitting: Emergency Medicine

## 2011-06-22 ENCOUNTER — Encounter: Payer: Self-pay | Admitting: *Deleted

## 2011-06-22 ENCOUNTER — Emergency Department (HOSPITAL_COMMUNITY): Payer: Medicaid Other

## 2011-06-22 DIAGNOSIS — F17200 Nicotine dependence, unspecified, uncomplicated: Secondary | ICD-10-CM | POA: Insufficient documentation

## 2011-06-22 DIAGNOSIS — L02619 Cutaneous abscess of unspecified foot: Secondary | ICD-10-CM | POA: Insufficient documentation

## 2011-06-22 DIAGNOSIS — L03119 Cellulitis of unspecified part of limb: Secondary | ICD-10-CM | POA: Insufficient documentation

## 2011-06-22 DIAGNOSIS — L0390 Cellulitis, unspecified: Secondary | ICD-10-CM

## 2011-06-22 DIAGNOSIS — F319 Bipolar disorder, unspecified: Secondary | ICD-10-CM | POA: Insufficient documentation

## 2011-06-22 DIAGNOSIS — L0291 Cutaneous abscess, unspecified: Secondary | ICD-10-CM

## 2011-06-22 HISTORY — DX: Bipolar disorder, unspecified: F31.9

## 2011-06-22 LAB — CBC
HCT: 45.7 % (ref 39.0–52.0)
Hemoglobin: 16.1 g/dL (ref 13.0–17.0)
MCV: 99.3 fL (ref 78.0–100.0)
RBC: 4.6 MIL/uL (ref 4.22–5.81)
WBC: 9.7 10*3/uL (ref 4.0–10.5)

## 2011-06-22 LAB — DIFFERENTIAL
Eosinophils Relative: 2 % (ref 0–5)
Lymphocytes Relative: 27 % (ref 12–46)
Lymphs Abs: 2.6 10*3/uL (ref 0.7–4.0)
Monocytes Absolute: 0.8 10*3/uL (ref 0.1–1.0)

## 2011-06-22 MED ORDER — OXYCODONE-ACETAMINOPHEN 5-325 MG PO TABS: 1.0000 | ORAL_TABLET | ORAL | Status: AC | PRN

## 2011-06-22 MED ORDER — OXYCODONE-ACETAMINOPHEN 5-325 MG PO TABS
1.0000 | ORAL_TABLET | Freq: Once | ORAL | Status: AC
  Administered 2011-06-22: 1 via ORAL
  Filled 2011-06-22: qty 1

## 2011-06-22 MED ORDER — CLINDAMYCIN HCL 300 MG PO CAPS: 300.0000 mg | ORAL_CAPSULE | Freq: Three times a day (TID) | ORAL | Status: DC

## 2011-06-22 MED ORDER — CLINDAMYCIN HCL 150 MG PO CAPS: 300.0000 mg | ORAL_CAPSULE | Freq: Once | ORAL | Status: DC

## 2011-06-22 MED ORDER — CLINDAMYCIN HCL 150 MG PO CAPS
300.0000 mg | ORAL_CAPSULE | Freq: Once | ORAL | Status: AC
  Administered 2011-06-22: 300 mg via ORAL
  Filled 2011-06-22: qty 2

## 2011-06-22 NOTE — ED Notes (Signed)
Pt c/o pain, redness and swelling to his right foot since Thursday.

## 2011-06-23 NOTE — ED Provider Notes (Signed)
History     CSN: [id number] Arrival date & time: 06/22/2011  1:00 PM  Chief Complaint  Patient presents with  . Foot Pain   HPI Comments: Patient developed foot pain,  Redness and swelling 2 days ago.  He reports having a lesion between great and 2nd toe on his right foot,  Thought to be an insect bite.  Has developed pain,  Swelling and redness to the foot over the past 2 days.  Denies injury.  Patient is a 40 y.o. male presenting with lower extremity pain. The history is provided by the patient.  Foot Pain This is a new problem. The current episode started in the past 7 days. The problem occurs constantly. Pertinent negatives include no abdominal pain, arthralgias, chest pain, chills, congestion, fever, headaches, joint swelling, nausea, neck pain, numbness, rash, sore throat or weakness. The symptoms are aggravated by bending and walking (applying pressure). He has tried acetaminophen and ice for the symptoms. The treatment provided no relief.    Past Medical History  Diagnosis Date  . Bipolar 1 disorder     Past Surgical History  Procedure Date  . Foot surgery     had peice of wire removed    History reviewed. No pertinent family history.  History  Substance Use Topics  . Smoking status: Current Everyday Smoker -- 0.5 packs/day  . Smokeless tobacco: Not on file  . Alcohol Use: Yes     occasionally      Review of Systems  Constitutional: Negative for fever and chills.  HENT: Negative for congestion, sore throat and neck pain.   Eyes: Negative.   Respiratory: Negative for chest tightness and shortness of breath.   Cardiovascular: Negative for chest pain.  Gastrointestinal: Negative for nausea and abdominal pain.  Genitourinary: Negative.   Musculoskeletal: Negative for joint swelling and arthralgias.  Skin: Positive for color change and wound. Negative for rash.  Neurological: Negative for dizziness, weakness, light-headedness, numbness and headaches.  Hematological:  Negative.   Psychiatric/Behavioral: Negative.     Physical Exam  BP 113/66  Pulse 64  Temp(Src) 97.8 F (36.6 C) (Oral)  Resp 16  Ht 5\' 11"  (1.803 m)  Wt 205 lb (92.987 kg)  BMI 28.59 kg/m2  SpO2 100%  Physical Exam  Nursing note and vitals reviewed. Constitutional: He is oriented to person, place, and time. He appears well-developed and well-nourished. No distress.  HENT:  Head: Normocephalic and atraumatic.  Eyes: Conjunctivae are normal.  Neck: Normal range of motion.  Cardiovascular: Normal rate, regular rhythm, normal heart sounds and intact distal pulses.   Pulmonary/Chest: Effort normal and breath sounds normal. He has no wheezes.  Abdominal: Soft. Bowel sounds are normal. There is no tenderness.  Musculoskeletal: Normal range of motion.  Neurological: He is alert and oriented to person, place, and time.  Skin: Skin is warm and dry. There is erythema.     Psychiatric: He has a normal mood and affect.    ED Course  Procedures  MDM Cellulitis.  Started doxy in ed.  Patient to return in 2 days for recheck,  Sooner if sx worsen.  Area of erythema marked with skin marker.      Candis Musa, PA 06/23/11 [phone]

## 2011-06-24 ENCOUNTER — Inpatient Hospital Stay (HOSPITAL_COMMUNITY)
Admission: EM | Admit: 2011-06-24 | Discharge: 2011-06-25 | DRG: 603 | Disposition: A | Discharge status: Home / Self Care | Payer: Medicaid Other | Attending: Internal Medicine | Admitting: Internal Medicine

## 2011-06-24 ENCOUNTER — Encounter (HOSPITAL_COMMUNITY): Payer: Self-pay

## 2011-06-24 DIAGNOSIS — D751 Secondary polycythemia: Secondary | ICD-10-CM | POA: Diagnosis present

## 2011-06-24 DIAGNOSIS — L03119 Cellulitis of unspecified part of limb: Secondary | ICD-10-CM | POA: Diagnosis present

## 2011-06-24 DIAGNOSIS — F319 Bipolar disorder, unspecified: Secondary | ICD-10-CM | POA: Diagnosis present

## 2011-06-24 DIAGNOSIS — L02619 Cutaneous abscess of unspecified foot: Principal | ICD-10-CM | POA: Diagnosis present

## 2011-06-24 DIAGNOSIS — B353 Tinea pedis: Secondary | ICD-10-CM | POA: Diagnosis present

## 2011-06-24 DIAGNOSIS — Z720 Tobacco use: Secondary | ICD-10-CM | POA: Diagnosis present

## 2011-06-24 DIAGNOSIS — F17200 Nicotine dependence, unspecified, uncomplicated: Secondary | ICD-10-CM | POA: Diagnosis present

## 2011-06-24 DIAGNOSIS — D45 Polycythemia vera: Secondary | ICD-10-CM | POA: Diagnosis present

## 2011-06-24 DIAGNOSIS — L0390 Cellulitis, unspecified: Secondary | ICD-10-CM

## 2011-06-24 HISTORY — DX: Suicide attempt, initial encounter: T14.91XA

## 2011-06-24 LAB — CBC
HCT: 48.3 % (ref 39.0–52.0)
MCH: 35.4 pg — ABNORMAL HIGH (ref 26.0–34.0)
MCV: 100 fL (ref 78.0–100.0)
Platelets: 251 10*3/uL (ref 150–400)
RBC: 4.83 MIL/uL (ref 4.22–5.81)
RDW: 12.9 % (ref 11.5–15.5)

## 2011-06-24 LAB — DIFFERENTIAL
Eosinophils Absolute: 0.3 10*3/uL (ref 0.0–0.7)
Eosinophils Relative: 3 % (ref 0–5)
Lymphs Abs: 2.2 10*3/uL (ref 0.7–4.0)
Monocytes Absolute: 0.9 10*3/uL (ref 0.1–1.0)

## 2011-06-24 LAB — BASIC METABOLIC PANEL
CO2: 30 mEq/L (ref 19–32)
Calcium: 9.5 mg/dL (ref 8.4–10.5)
Creatinine, Ser: 1.33 mg/dL (ref 0.50–1.35)
Glucose, Bld: 87 mg/dL (ref 70–99)

## 2011-06-24 MED ORDER — VANCOMYCIN HCL IN DEXTROSE 1-5 GM/200ML-% IV SOLN
INTRAVENOUS | Status: AC
  Filled 2011-06-24: qty 200

## 2011-06-24 MED ORDER — MORPHINE SULFATE 2 MG/ML IJ SOLN: 4.0000 mg | INTRAMUSCULAR | Status: DC | PRN

## 2011-06-24 MED ORDER — ONDANSETRON HCL 4 MG/2ML IJ SOLN
4.0000 mg | Freq: Once | INTRAMUSCULAR | Status: AC
  Administered 2011-06-24: 4 mg via INTRAVENOUS
  Filled 2011-06-24: qty 2

## 2011-06-24 MED ORDER — ALUM & MAG HYDROXIDE-SIMETH 200-200-20 MG/5ML PO SUSP: 30.0000 mL | Freq: Four times a day (QID) | ORAL | Status: DC | PRN

## 2011-06-24 MED ORDER — SODIUM CHLORIDE 0.9 % IJ SOLN
INTRAMUSCULAR | Status: AC
  Filled 2011-06-24: qty 10

## 2011-06-24 MED ORDER — LITHIUM CARBONATE 150 MG PO CAPS
300.0000 mg | ORAL_CAPSULE | Freq: Two times a day (BID) | ORAL | Status: DC
Start: 2011-06-24 — End: 2011-06-25
  Administered 2011-06-24 – 2011-06-25 (×2): 300 mg via ORAL
  Filled 2011-06-24 (×2): qty 2

## 2011-06-24 MED ORDER — POTASSIUM CHLORIDE IN NACL 20-0.9 MEQ/L-% IV SOLN
INTRAVENOUS | Status: DC
  Administered 2011-06-24 – 2011-06-25 (×2): via INTRAVENOUS

## 2011-06-24 MED ORDER — VANCOMYCIN HCL 1000 MG IV SOLR
1250.0000 mg | Freq: Two times a day (BID) | INTRAVENOUS | Status: DC
  Administered 2011-06-24 – 2011-06-25 (×2): 1250 mg via INTRAVENOUS
  Filled 2011-06-24 (×4): qty 1250

## 2011-06-24 MED ORDER — LEVOFLOXACIN IN D5W 500 MG/100ML IV SOLN
INTRAVENOUS | Status: AC
  Filled 2011-06-24: qty 100

## 2011-06-24 MED ORDER — HEPARIN SODIUM (PORCINE) 5000 UNIT/ML IJ SOLN
5000.0000 [IU] | Freq: Three times a day (TID) | INTRAMUSCULAR | Status: DC
  Administered 2011-06-24 – 2011-06-25 (×3): 5000 [IU] via SUBCUTANEOUS
  Filled 2011-06-24 (×3): qty 1

## 2011-06-24 MED ORDER — MORPHINE SULFATE 4 MG/ML IJ SOLN
4.0000 mg | Freq: Once | INTRAMUSCULAR | Status: AC
  Administered 2011-06-24: 4 mg via INTRAVENOUS
  Filled 2011-06-24: qty 1

## 2011-06-24 MED ORDER — TERBINAFINE HCL 1 % EX CREA
TOPICAL_CREAM | Freq: Every day | CUTANEOUS | Status: DC
  Filled 2011-06-24: qty 12

## 2011-06-24 MED ORDER — ONDANSETRON HCL 4 MG PO TABS: 4.0000 mg | ORAL_TABLET | Freq: Four times a day (QID) | ORAL | Status: DC | PRN

## 2011-06-24 MED ORDER — TERBINAFINE HCL 1 % EX CREA
TOPICAL_CREAM | Freq: Two times a day (BID) | CUTANEOUS | Status: DC
  Administered 2011-06-24 – 2011-06-25 (×2): via TOPICAL
  Filled 2011-06-24: qty 12

## 2011-06-24 MED ORDER — OXYCODONE HCL 5 MG PO TABS
5.0000 mg | ORAL_TABLET | ORAL | Status: DC | PRN
  Administered 2011-06-24 – 2011-06-25 (×4): 5 mg via ORAL
  Filled 2011-06-24 (×4): qty 1

## 2011-06-24 MED ORDER — LEVOFLOXACIN IN D5W 500 MG/100ML IV SOLN
500.0000 mg | INTRAVENOUS | Status: DC
  Administered 2011-06-24: 500 mg via INTRAVENOUS
  Filled 2011-06-24: qty 100

## 2011-06-24 MED ORDER — SODIUM CHLORIDE 0.9 % IV SOLN
INTRAVENOUS | Status: DC
  Filled 2011-06-24 (×3): qty 1000

## 2011-06-24 MED ORDER — LURASIDONE HCL 80 MG PO TABS: 80.0000 mg | ORAL_TABLET | Freq: Every day | ORAL | Status: DC

## 2011-06-24 MED ORDER — DOXYCYCLINE HYCLATE 100 MG PO TABS
100.0000 mg | ORAL_TABLET | Freq: Two times a day (BID) | ORAL | Status: DC
  Administered 2011-06-24 – 2011-06-25 (×2): 100 mg via ORAL
  Filled 2011-06-24 (×2): qty 1

## 2011-06-24 MED ORDER — ONDANSETRON HCL 4 MG/2ML IJ SOLN: 4.0000 mg | Freq: Four times a day (QID) | INTRAMUSCULAR | Status: DC | PRN

## 2011-06-24 MED ORDER — DOCUSATE SODIUM 100 MG PO CAPS
100.0000 mg | ORAL_CAPSULE | Freq: Every day | ORAL | Status: DC
  Administered 2011-06-24 – 2011-06-25 (×2): 100 mg via ORAL
  Filled 2011-06-24 (×2): qty 1

## 2011-06-24 MED ORDER — VANCOMYCIN HCL IN DEXTROSE 1-5 GM/200ML-% IV SOLN
1000.0000 mg | Freq: Once | INTRAVENOUS | Status: AC
  Administered 2011-06-24: 1000 mg via INTRAVENOUS
  Filled 2011-06-24: qty 200

## 2011-06-24 NOTE — ED Notes (Signed)
Pt here for re-check for infection in his rt foot.  Pt foot is red, and swollen.  Pt reports severe pain to the area.

## 2011-06-24 NOTE — Progress Notes (Signed)
ANTIBIOTIC CONSULT NOTE - INITIAL  Pharmacy Consult for Vancomycin Indication: Cellulitis  Patient Measurements: Height: 5\' 11"  (180.3 cm) Weight: 205 lb 0.1 oz (92.989 kg) IBW/kg (Calculated) : 75.3    Vital Signs: Temp: 97.2 F (36.2 C) (09/03 1400) Temp src: Oral (09/03 1400) BP: 129/78 mmHg (09/03 1400) Pulse Rate: 65  (09/03 1400) Intake/Output from previous day:   Intake/Output from this shift:    Labs:  Basename 06/24/11 1120 06/22/11 1444  WBC 8.7 9.7  HGB 17.1* 16.1  PLT 251 238  LABCREA -- --  CREATININE 1.33 --  CRCLEARANCE -- --   No results found for this basename: VANCOTROUGH:2,VANCOPEAK:2,VANCORANDOM:2,GENTTROUGH:2,GENTPEAK:2,GENTRANDOM:2,TOBRATROUGH:2,TOBRAPEAK:2,TOBRARND:2,AMIKACINPEAK:2,AMIKACINTROU:2,AMIKACIN:2, in the last 72 hours   Microbiology: No results found for this or any previous visit (from the past 720 hour(s)).  Medical History: Past Medical History  Diagnosis Date  . Bipolar 1 disorder     Medications:  Scheduled:    . docusate sodium  100 mg Oral Daily  . heparin  5,000 Units Subcutaneous Q8H  . levofloxacin (LEVAQUIN) IV  500 mg Intravenous Q24H  . lithium carbonate  300 mg Oral BID WC  . lurasidone  80 mg Oral Q breakfast  . morphine  4 mg Intravenous Once  . ondansetron  4 mg Intravenous Once  . terbinafine   Topical BID  . vancomycin  1,250 mg Intravenous Q12H  . vancomycin  1,000 mg Intravenous Once  . DISCONTD: terbinafine   Topical Daily   Assessment: Ok for protocol   Goal of Therapy:  Vancomycin trough level 10-15 mcg/ml  Plan:  Vancomycin 1250 mg IV every 12 hours Measure antibiotic drug levels at steady state  [address], Etienne Mowers [place] 06/24/2011,4:37 PM

## 2011-06-24 NOTE — ED Notes (Addendum)
Report given to [place], RN on unit 300. Patient in NAD. Respirations even and unlabored. Skin warm/dry. Patient transported to Unit 300 via wheelchair by Fredna Dow, ED tech.

## 2011-06-24 NOTE — H&P (Signed)
Lance Cohen MRN: [id number] DOB/AGE: 05-19-1971 40 y.o. Primary Care Physician:No primary provider on file. Admit date: 06/24/2011 Chief Complaint: Swollen, painful, and red right foot. HPI: The patient is a 40 year old man with a past medical history significant for bipolar disorder, who presents to the hospital today with a chief complaint of a painful, swollen, and red right foot. He presented initially to the emergency department on 10/21/2010 when his symptoms began. He was evaluated in the emergency department. He was given doxycycline and discharged from the ED on clindamycin. He states that he had been taking clindamycin as prescribed, however it did make him nauseated, but he continued to take it. He was advised to come back to the emergency department today for a recheck. He states that his foot is more painful, more swollen, and the redness has increased on the dorsum of his foot. Three days ago, when he first noticed the redness and swelling on the dorsum of his right foot, he does not recall being bitten by an insect. He has not had trauma. He acknowledges chronic "athletes feet". He had particular itching between his fourth and fifth toes. He tried to cleanse the area with alcohol. It burned a little. Otherwise, there has been no drainage. He denies subjective fever or chills. He denies diarrhea.  Past Medical History  Diagnosis Date  . Bipolar 1 disorder   . Suicide attempt     2010 Intentional overdose attempt with Depakote.    Past Surgical History  Procedure Date  . Foot surgery     had peice of wire removed    Prior to Admission medications   Medication Sig Start Date End Date Taking? Authorizing Provider  clindamycin (CLEOCIN) 300 MG capsule Take 1 capsule (300 mg total) by mouth 3 (three) times daily. 06/22/11 07/02/11 Yes Jola Baptist Idol, PA  lithium carbonate 300 MG capsule Take 300 mg by mouth 2 (two) times daily with a meal.     Yes Historical Provider, MD  lurasidone  (LATUDA) 80 MG TABS Take 80 mg by mouth daily with breakfast.     Yes Historical Provider, MD  oxyCODONE-acetaminophen (PERCOCET) 5-325 MG per tablet Take 1 tablet by mouth every 4 (four) hours as needed for pain. 06/22/11 07/02/11 Yes Candis Musa, PA    Allergies:  Allergies  Allergen Reactions  . Penicillins Other (See Comments)     chest pain.     Family history: His mother died of an unknown cancer, but it had metastasized. His father died of what was believed to be a heart attack.  Social History: He is married. He lives in [place]. He has no children. He is disabled from his bipolar disorder. He smokes a half a pack of cigarettes per day. He drinks alcohol either whiskey or beer twice weekly if he has it. He denies illicit drug use.     ROS: Otherwise negative.  PHYSICAL EXAM: Blood pressure 129/78, pulse 65, temperature 97.2 F (36.2 C), temperature source Oral, resp. rate 19, height 5\' 11"  (1.803 m), weight 92.989 kg (205 lb 0.1 oz), SpO2 97.00%. General: The patient is sitting up in bed, in no acute distress. HEENT: Head is normocephalic, nontraumatic. Pupils are equal, round, and reactive to light. Extraocular movements are intact. Conjunctivae are clear. Sclerae are white. Oropharynx reveals multiple missing teeth. Mucous membranes are mildly dry. No posterior exudates or erythema. Neck: Supple, no adenopathy, no thyromegaly, no JVD. Lungs: Clear to auscultation bilaterally. Heart: S1, S2, no murmurs  rubs or gallops. Abdomen: Positive bowel sounds, nontender, nondistended. GU and rectal deferred. Extremities: The right foot is globally edematous, 2+ nonpitting edema, the dorsum is erythematous, with more erythema over the fourth and fifth metatarsal area. There is some streakiness at the lower pretibial area. The fifth toe is more swollen than the other toes. There is scaly erythema on the soles of both feet, consistent with a fungal infection. There is a  silvery coloration and erythema between the fourth and fifth digits on the right foot. Moderately tender over the right foot, no calf tenderness. Left foot without edema. Pedal pulses 2+ bilaterally.  Neurologic/psychological: Alert and oriented x3. Cranial nerves II through XII are intact. Speech is clear. Affect is pleasant. He is cooperative.     Basic Metabolic Panel:  Basename 06/24/11 1120  NA 140  K 4.6  CL 102  CO2 30  GLUCOSE 87  BUN 14  CREATININE 1.33  CALCIUM 9.5  MG --  PHOS --   Liver Function Tests: No results found for this basename: AST:2,ALT:2,ALKPHOS:2,BILITOT:2,PROT:2,ALBUMIN:2 in the last 72 hours No results found for this basename: LIPASE:2,AMYLASE:2 in the last 72 hours No results found for this basename: AMMONIA:2 in the last 72 hours CBC:  Basename 06/24/11 1120 06/22/11 1444  WBC 8.7 9.7  NEUTROABS 5.3 6.0  HGB 17.1* 16.1  HCT 48.3 45.7  MCV 100.0 99.3  PLT 251 238   Cardiac Enzymes: No results found for this basename: CKTOTAL:3,CKMB:3,CKMBINDEX:3,TROPONINI:3 in the last 72 hours BNP: No results found for this basename: POCBNP:3 in the last 72 hours D-Dimer: No results found for this basename: DDIMER:2 in the last 72 hours CBG: No results found for this basename: GLUCAP:6 in the last 72 hours Hemoglobin A1C: No results found for this basename: HGBA1C in the last 72 hours Fasting Lipid Panel: No results found for this basename: CHOL,HDL,LDLCALC,TRIG,CHOLHDL,LDLDIRECT in the last 72 hours Thyroid Function Tests: No results found for this basename: TSH,T4TOTAL,FREET4,T3FREE,THYROIDAB in the last 72 hours Anemia Panel: No results found for this basename: VITAMINB12,FOLATE,FERRITIN,TIBC,IRON,RETICCTPCT in the last 72 hours    No results found for this or any previous visit (from the past 240 hour(s)).   Results for orders placed during the hospital encounter of 06/24/11 (from the past 48 hour(s))  BASIC METABOLIC PANEL     Status: Abnormal     Collection Time   06/24/11 11:20 AM      Component Value Range Comment   Sodium 140  135 - 145 (mEq/L)    Potassium 4.6  3.5 - 5.1 (mEq/L)    Chloride 102  96 - 112 (mEq/L)    CO2 30  19 - 32 (mEq/L)    Glucose, Bld 87  70 - 99 (mg/dL)    BUN 14  6 - 23 (mg/dL)    Creatinine, Ser 9.60  0.50 - 1.35 (mg/dL)    Calcium 9.5  8.4 - 10.5 (mg/dL)    GFR calc non Af Amer 60 (*) >60 (mL/min)    GFR calc Af Amer >60  >60 (mL/min)   CBC     Status: Abnormal   Collection Time   06/24/11 11:20 AM      Component Value Range Comment   WBC 8.7  4.0 - 10.5 (K/uL)    RBC 4.83  4.22 - 5.81 (MIL/uL)    Hemoglobin 17.1 (*) 13.0 - 17.0 (g/dL)    HCT 45.4  09.8 - 11.9 (%)    MCV 100.0  78.0 - 100.0 (fL)  MCH 35.4 (*) 26.0 - 34.0 (pg)    MCHC 35.4  30.0 - 36.0 (g/dL)    RDW 91.4  78.2 - 95.6 (%)    Platelets 251  150 - 400 (K/uL)   DIFFERENTIAL     Status: Normal   Collection Time   06/24/11 11:20 AM      Component Value Range Comment   Neutrophils Relative 61  43 - 77 (%)    Neutro Abs 5.3  1.7 - 7.7 (K/uL)    Lymphocytes Relative 25  12 - 46 (%)    Lymphs Abs 2.2  0.7 - 4.0 (K/uL)    Monocytes Relative 10  3 - 12 (%)    Monocytes Absolute 0.9  0.1 - 1.0 (K/uL)    Eosinophils Relative 3  0 - 5 (%)    Eosinophils Absolute 0.3  0.0 - 0.7 (K/uL)    Basophils Relative 1  0 - 1 (%)    Basophils Absolute 0.1  0.0 - 0.1 (K/uL)     Dg Foot Complete Right  06/22/2011  *RADIOLOGY REPORT*  Clinical Data: Pain and swelling, no known injury  RIGHT FOOT COMPLETE - 3+ VIEW  Comparison: None.  Findings: Three views of the right foot submitted.  No acute fracture or subluxation.  No periosteal reaction or bony erosion. No radiopaque foreign body.  IMPRESSION: No acute fracture or subluxation.  Original Report Authenticated By: Natasha Mead, M.D.    Impression:  1. Right foot cellulitis, with failed outpatient treatment. 2. Concomitant tinea pedis. 3. Mild polycythemia with a hemoglobin of 17.1. Possibly  secondary to mild volume depletion. 4. Bipolar disorder, stable. 5. Tobacco abuse.    Plan:  1. Vancomycin was given in the emergency department. We will continue vancomycin per pharmacy. We will add intravenous Levaquin. 2.IV fluid hydration with 1 L of normal saline over the next 24 hours. 3. Tobacco cessation counseling. The patient was advised to stop smoking. 4. Continue chronic medications, except clindamycin and Percocet which were prescribed 2 days ago will be discontinued.       Areya Lemmerman 06/24/2011, 4:39 PM

## 2011-06-24 NOTE — ED Provider Notes (Addendum)
History     CSN: [id number] Arrival date & time: 06/24/2011 10:26 AM  Chief Complaint  Patient presents with  . Cellulitis   Patient is a 40 y.o. male presenting with wound check. The history is provided by the patient.  Wound Check  He was treated in the ED 2 to 3 days ago. Prior ED Treatment: Patient with no known injury to right foot,  but redness,  swelling and increased pain diagnosed with cellulitis. Treatments since wound repair include oral antibiotics (He was treated with clindamycin PO and has had 4 doses to date,  with increased pain and redness of his right foot.). His temperature was unmeasured prior to arrival. There has been no drainage from the wound. The redness has worsened. The swelling has worsened. The pain has worsened. There is difficulty moving the extremity or digit due to pain.    Past Medical History  Diagnosis Date  . Bipolar 1 disorder     Past Surgical History  Procedure Date  . Foot surgery     had peice of wire removed    No family history on file.  History  Substance Use Topics  . Smoking status: Current Everyday Smoker -- 0.5 packs/day  . Smokeless tobacco: Not on file  . Alcohol Use: Yes     occasionally      Review of Systems  All other systems reviewed and are negative.    Physical Exam  BP 132/84  Pulse 60  Temp(Src) 97.8 F (36.6 C) (Oral)  Resp 18  Ht 5\' 11"  (1.803 m)  Wt 205 lb (92.987 kg)  BMI 28.59 kg/m2  SpO2 100%  Physical Exam  Nursing note and vitals reviewed. Constitutional: He is oriented to person, place, and time. He appears well-developed and well-nourished. No distress.       Looks uncomfortable.  HENT:  Head: Normocephalic and atraumatic.  Eyes: Conjunctivae are normal.  Neck: Normal range of motion.  Cardiovascular: Normal rate, regular rhythm, normal heart sounds and intact distal pulses.   Pulmonary/Chest: Effort normal and breath sounds normal. He has no wheezes.  Abdominal: Soft. Bowel sounds are  normal. There is no tenderness.  Musculoskeletal: Normal range of motion.  Neurological: He is alert and oriented to person, place, and time.  Skin: Skin is warm and dry. There is erythema.     Psychiatric: He has a normal mood and affect.    ED Course  Procedures  MDM Discussed with Dr. Sherrie Mustache who will admit for IV abx.  Orders given - cellulitis not responding to outpt tx.    06/24/2011- 11:09 AM Benny Lennert, MD to bedside to evaluate patient.   Patient with significant area of erythema to right foot which has extended past marked borders previously drawn. Patient notes he was evaluated in ED 2 days ago for same complaint and dx with cellulitis and given prescription for Clindamycin. Patient reports he has been compliant with Clindamycin but infection continues to worsen.   Medical screening examination/treatment/procedure(s) were conducted as a shared visit with non-physician practitioner(s) and myself.  I personally evaluated the patient during the encounter Benny Lennert, MD     Candis Musa, PA 06/24/11 1256                       pe swollen tender foot  Benny Lennert, MD 07/05/11 [phone]

## 2011-06-24 NOTE — Progress Notes (Signed)
The RN Arline Asp, reported that the patient c/o itching and burning at the IV site while Levaquin was infusing. There were no urticarial lesions or a rash or difficulty breathing. Will d/c Levaquin and start oral Doxycycline and continue IV Vancomycin.

## 2011-06-24 NOTE — ED Notes (Signed)
Patient ambulatory to restroom with steady gait. Returned to room without complications. Hospital socks provided to patient per request.

## 2011-06-25 LAB — COMPREHENSIVE METABOLIC PANEL
AST: 18 U/L (ref 0–37)
Albumin: 3 g/dL — ABNORMAL LOW (ref 3.5–5.2)
Calcium: 8.2 mg/dL — ABNORMAL LOW (ref 8.4–10.5)
Creatinine, Ser: 1.26 mg/dL (ref 0.50–1.35)
Sodium: 139 mEq/L (ref 135–145)
Total Protein: 6.1 g/dL (ref 6.0–8.3)

## 2011-06-25 LAB — CBC
HCT: 43.3 % (ref 39.0–52.0)
Hemoglobin: 15.2 g/dL (ref 13.0–17.0)
MCH: 34.7 pg — ABNORMAL HIGH (ref 26.0–34.0)
MCHC: 35.1 g/dL (ref 30.0–36.0)
MCV: 98.9 fL (ref 78.0–100.0)
Platelets: 214 10*3/uL (ref 150–400)
RBC: 4.38 MIL/uL (ref 4.22–5.81)
RDW: 12.8 % (ref 11.5–15.5)

## 2011-06-25 MED ORDER — TERBINAFINE HCL 1 % EX CREA: TOPICAL_CREAM | Freq: Two times a day (BID) | CUTANEOUS | Status: AC

## 2011-06-25 MED ORDER — DOXYCYCLINE HYCLATE 100 MG PO TABS: 100.0000 mg | ORAL_TABLET | Freq: Two times a day (BID) | ORAL | Status: AC

## 2011-06-25 NOTE — Progress Notes (Signed)
ANTIBIOTIC CONSULT NOTE -   Pharmacy Consult for Vancomycin Indication: Cellulitis  Patient Measurements: Height: 5\' 11"  (180.3 cm) Weight: 205 lb 0.1 oz (92.989 kg) IBW/kg (Calculated) : 75.3    Vital Signs:  Afebrile.    Labs:  [facility] 06/25/11 0612 06/24/11 1120 06/22/11 1444  WBC 6.8 8.7 9.7  HGB 15.2 17.1* 16.1  PLT 214 251 238  LABCREA -- -- --  CREATININE 1.26 1.33 --  CRCLEARANCE -- -- --      Microbiology:  None.    Medications:  Scheduled:     . docusate sodium  100 mg Oral Daily  . doxycycline  100 mg Oral Q12H  . heparin  5,000 Units Subcutaneous Q8H  . lithium carbonate  300 mg Oral BID WC  . lurasidone  80 mg Oral Q breakfast  . morphine  4 mg Intravenous Once  . ondansetron  4 mg Intravenous Once  . sodium chloride      . terbinafine   Topical BID  . vancomycin  1,250 mg Intravenous Q12H  . vancomycin  1,000 mg Intravenous Once  . DISCONTD: levofloxacin (LEVAQUIN) IV  500 mg Intravenous Q24H  . DISCONTD: terbinafine   Topical Daily   Assessment: Ok for protocol   Goal of Therapy:  Vancomycin trough level 10-15 mcg/ml  Plan:  Vancomycin 1250 mg IV every 12 hours Vancomycin trough on 06/26/11 at 900am  [place], [place] J 06/25/2011,8:00 AM

## 2011-06-25 NOTE — ED Provider Notes (Signed)
Medical screening examination/treatment/procedure(s) were performed by non-physician practitioner and as supervising physician I was immediately available for consultation/collaboration.   Luke Falero L Shaniquia Brafford, MD 06/25/11 1313 

## 2011-06-25 NOTE — Progress Notes (Signed)
Pt discharged home via family; Pt and family given and explained all discharge instructions, carenotes, and prescriptions; pt and family stated understanding and denied questions/concerns; all f/u appointments in place; IV removed without complicaitons; pt stable at time of discharge  

## 2011-06-25 NOTE — ED Provider Notes (Deleted)
Medical screening examination/treatment/procedure(s) were performed by non-physician practitioner and as supervising physician I was immediately available for consultation/collaboration.   Rebecah Dangerfield L Wanita Derenzo, MD 06/25/11 1319 

## 2011-06-25 NOTE — Discharge Summary (Signed)
Physician Discharge Summary  Patient ID: Lance Cohen MRN: [id number] DOB/AGE: 1971-01-22 40 y.o.  Admit date: 06/24/2011 Discharge date: 06/25/2011  Discharge Diagnoses:  Active Problems:  Cellulitis of foot  Bipolar 1 disorder  Tobacco abuse  Polycythemia  Tinea pedis   Current Discharge Medication List    START taking these medications   Details  doxycycline (VIBRA-TABS) 100 MG tablet Take 1 tablet (100 mg total) by mouth 2 (two) times daily. Qty: 14 tablet, Refills: 0    terbinafine (LAMISIL) 1 % cream Apply topically 2 (two) times daily. Qty: 30 g      CONTINUE these medications which have NOT CHANGED   Details  lithium carbonate 300 MG capsule Take 300 mg by mouth 2 (two) times daily with a meal.      lurasidone (LATUDA) 80 MG TABS Take 80 mg by mouth daily with breakfast.      oxyCODONE-acetaminophen (PERCOCET) 5-325 MG per tablet Take 1 tablet by mouth every 4 (four) hours as needed for pain. Qty: 15 tablet, Refills: 0      STOP taking these medications     clindamycin (CLEOCIN) 300 MG capsule         Discharge Orders    Future Orders Please Complete By Expires   Increase activity slowly      Discharge instructions      Comments:   Keep foot elevated while at rest or in bed      Follow-up Information    Follow up with [facility] health Department. (If symptoms worsen)          Disposition: Home or Self Care  Discharged Condition: Stable  Consults:   none  Labs:   Results for orders placed during the hospital encounter of 06/24/11 (from the past 48 hour(s))  BASIC METABOLIC PANEL     Status: Abnormal   Collection Time   06/24/11 11:20 AM      Component Value Range Comment   Sodium 140  135 - 145 (mEq/L)    Potassium 4.6  3.5 - 5.1 (mEq/L)    Chloride 102  96 - 112 (mEq/L)    CO2 30  19 - 32 (mEq/L)    Glucose, Bld 87  70 - 99 (mg/dL)    BUN 14  6 - 23 (mg/dL)    Creatinine, Ser 4.09  0.50 - 1.35 (mg/dL)    Calcium 9.5  8.4 -  10.5 (mg/dL)    GFR calc non Af Amer 60 (*) >60 (mL/min)    GFR calc Af Amer >60  >60 (mL/min)   CBC     Status: Abnormal   Collection Time   06/24/11 11:20 AM      Component Value Range Comment   WBC 8.7  4.0 - 10.5 (K/uL)    RBC 4.83  4.22 - 5.81 (MIL/uL)    Hemoglobin 17.1 (*) 13.0 - 17.0 (g/dL)    HCT 81.1  91.4 - 78.2 (%)    MCV 100.0  78.0 - 100.0 (fL)    MCH 35.4 (*) 26.0 - 34.0 (pg)    MCHC 35.4  30.0 - 36.0 (g/dL)    RDW 95.6  21.3 - 08.6 (%)    Platelets 251  150 - 400 (K/uL)   DIFFERENTIAL     Status: Normal   Collection Time   06/24/11 11:20 AM      Component Value Range Comment   Neutrophils Relative 61  43 - 77 (%)    Neutro Abs 5.3  1.7 - 7.7 (K/uL)    Lymphocytes Relative 25  12 - 46 (%)    Lymphs Abs 2.2  0.7 - 4.0 (K/uL)    Monocytes Relative 10  3 - 12 (%)    Monocytes Absolute 0.9  0.1 - 1.0 (K/uL)    Eosinophils Relative 3  0 - 5 (%)    Eosinophils Absolute 0.3  0.0 - 0.7 (K/uL)    Basophils Relative 1  0 - 1 (%)    Basophils Absolute 0.1  0.0 - 0.1 (K/uL)   COMPREHENSIVE METABOLIC PANEL     Status: Abnormal   Collection Time   06/25/11  6:12 AM      Component Value Range Comment   Sodium 139  135 - 145 (mEq/L)    Potassium 3.9  3.5 - 5.1 (mEq/L)    Chloride 102  96 - 112 (mEq/L)    CO2 30  19 - 32 (mEq/L)    Glucose, Bld 109 (*) 70 - 99 (mg/dL)    BUN 11  6 - 23 (mg/dL)    Creatinine, Ser 1.61  0.50 - 1.35 (mg/dL)    Calcium 8.2 (*) 8.4 - 10.5 (mg/dL)    Total Protein 6.1  6.0 - 8.3 (g/dL)    Albumin 3.0 (*) 3.5 - 5.2 (g/dL)    AST 18  0 - 37 (U/L)    ALT 16  0 - 53 (U/L)    Alkaline Phosphatase 96  39 - 117 (U/L)    Total Bilirubin 0.4  0.3 - 1.2 (mg/dL)    GFR calc non Af Amer >60  >60 (mL/min)    GFR calc Af Amer >60  >60 (mL/min)   CBC     Status: Abnormal   Collection Time   06/25/11  6:12 AM      Component Value Range Comment   WBC 6.8  4.0 - 10.5 (K/uL)    RBC 4.38  4.22 - 5.81 (MIL/uL)    Hemoglobin 15.2  13.0 - 17.0 (g/dL)    HCT 09.6   04.5 - 40.9 (%)    MCV 98.9  78.0 - 100.0 (fL)    MCH 34.7 (*) 26.0 - 34.0 (pg)    MCHC 35.1  30.0 - 36.0 (g/dL)    RDW 81.1  91.4 - 78.2 (%)    Platelets 214  150 - 400 (K/uL)     Diagnostics:  Dg Foot Complete Right  06/22/2011  *RADIOLOGY REPORT*  Clinical Data: Pain and swelling, no known injury  RIGHT FOOT COMPLETE - 3+ VIEW  Comparison: None.  Findings: Three views of the right foot submitted.  No acute fracture or subluxation.  No periosteal reaction or bony erosion. No radiopaque foreign body.  IMPRESSION: No acute fracture or subluxation.  Original Report Authenticated By: Natasha Mead, M.D.    Full Code   Hospital Course: Please see H&P for complete admission details. The patient is a 40 year old white male who was being treated as an outpatient with clindamycin for cellulitis of the foot. He also has tinea pedis. He has recently had a lot of flea bites as well. His cellulitis was worsening and he thought he might be having an allergic reaction to the clindamycin. He reported severe itching of his face. On presentation, he was afebrile. He had a normal white blood cell count, but he had cellulitis involving the dorsum of the right foot from the toes to above the ankle. He was started on vancomycin and levofloxacin. He had itching  and pain around the infusion site when he received the levofloxacin. This was stopped. Today, his cellulitis is nearly resolved except for a 2 cm area between the fourth and fifth toes. His pain is much improved. He was started on doxycycline last night and has tolerated it well. He is not diabetic. He sometimes goes to the [facility] [employer]. He can return there if he runs into problems but at this point is stable for discharge. I will give him a last dose of vancomycin which is due right now then he can be discharged. Total time on the day of discharge is greater than 30 minutes.  Discharge Exam: Blood pressure 135/73, pulse 81, temperature 97.6  F (36.4 C), temperature source Oral, resp. rate 20, height 5\' 11"  (1.803 m), weight 92.989 kg (205 lb 0.1 oz), SpO2 92.00%.  General: Nontoxic comfortable Right foot with about a 2 cm area of intense erythema and tenderness between the fourth and fifth toes the rest of the cellulitis has essentially resolved  Signed: Wajiha Versteeg L 06/25/2011, 9:58 AM

## 2012-10-28 ENCOUNTER — Encounter (HOSPITAL_COMMUNITY): Payer: Self-pay

## 2012-10-28 ENCOUNTER — Other Ambulatory Visit (HOSPITAL_COMMUNITY): Payer: Self-pay | Admitting: Nurse Practitioner

## 2012-10-28 ENCOUNTER — Ambulatory Visit (HOSPITAL_COMMUNITY)
Admission: RE | Admit: 2012-10-28 | Discharge: 2012-10-28 | Disposition: A | Discharge status: Home / Self Care | Payer: Medicaid Other | Source: Ambulatory Visit | Attending: Nurse Practitioner | Admitting: Nurse Practitioner

## 2012-10-28 DIAGNOSIS — R05 Cough: Secondary | ICD-10-CM | POA: Insufficient documentation

## 2012-10-28 DIAGNOSIS — R059 Cough, unspecified: Secondary | ICD-10-CM | POA: Insufficient documentation

## 2012-10-28 HISTORY — DX: Unspecified asthma, uncomplicated: J45.909

## 2013-08-23 ENCOUNTER — Encounter (HOSPITAL_COMMUNITY): Payer: Self-pay | Admitting: Emergency Medicine

## 2013-08-23 ENCOUNTER — Emergency Department (HOSPITAL_COMMUNITY)
Admission: EM | Admit: 2013-08-23 | Discharge: 2013-08-23 | Disposition: A | Discharge status: Home / Self Care | Payer: Medicaid Other | Attending: Emergency Medicine | Admitting: Emergency Medicine

## 2013-08-23 ENCOUNTER — Emergency Department (HOSPITAL_COMMUNITY): Payer: Medicaid Other

## 2013-08-23 DIAGNOSIS — Z79899 Other long term (current) drug therapy: Secondary | ICD-10-CM | POA: Insufficient documentation

## 2013-08-23 DIAGNOSIS — G8929 Other chronic pain: Secondary | ICD-10-CM

## 2013-08-23 DIAGNOSIS — J45909 Unspecified asthma, uncomplicated: Secondary | ICD-10-CM | POA: Insufficient documentation

## 2013-08-23 DIAGNOSIS — M25569 Pain in unspecified knee: Secondary | ICD-10-CM | POA: Insufficient documentation

## 2013-08-23 DIAGNOSIS — F17200 Nicotine dependence, unspecified, uncomplicated: Secondary | ICD-10-CM | POA: Insufficient documentation

## 2013-08-23 DIAGNOSIS — F319 Bipolar disorder, unspecified: Secondary | ICD-10-CM | POA: Insufficient documentation

## 2013-08-23 DIAGNOSIS — Z880 Allergy status to penicillin: Secondary | ICD-10-CM | POA: Insufficient documentation

## 2013-08-23 HISTORY — DX: Pain in unspecified knee: M25.569

## 2013-08-23 HISTORY — DX: Other chronic pain: G89.29

## 2013-08-23 MED ORDER — DICLOFENAC SODIUM 75 MG PO TBEC: 75.0000 mg | DELAYED_RELEASE_TABLET | Freq: Two times a day (BID) | ORAL | Status: DC

## 2013-08-23 NOTE — ED Provider Notes (Signed)
CSN: [id number]     Arrival date & time 08/23/13  1345 History   First MD Initiated Contact with Patient 08/23/13 1426     Chief Complaint  Patient presents with  . Knee Pain   (Consider location/radiation/quality/duration/timing/severity/associated sxs/prior Treatment) Patient is a 42 y.o. male presenting with knee pain. The history is provided by the patient.  Knee Pain Location:  Knee Time since incident:  1 week Injury: no   Knee location:  R knee Pain details:    Quality:  Aching and throbbing   Radiates to:  Does not radiate   Severity:  Moderate   Onset quality:  Gradual   Duration: hx of chronic righ tknee pain but worsening pain for 1 week.   Timing:  Constant   Progression:  Worsening Chronicity:  Chronic Dislocation: no   Foreign body present:  No foreign bodies Prior injury to area:  No Relieved by:  Nothing Worsened by:  Activity, bearing weight and flexion Ineffective treatments:  None tried Associated symptoms: no back pain, no decreased ROM, no fever, no neck pain, no numbness, no swelling and no tingling     Past Medical History  Diagnosis Date  . Bipolar 1 disorder   . Suicide attempt     2010 Intentional overdose attempt with Depakote.  . Asthma   . Chronic knee pain    Past Surgical History  Procedure Laterality Date  . Foot surgery      had peice of wire removed   History reviewed. No pertinent family history. History  Substance Use Topics  . Smoking status: Current Every Day Smoker -- 0.50 packs/day  . Smokeless tobacco: Not on file  . Alcohol Use: Yes     Comment: occasionally    Review of Systems  Constitutional: Negative for fever and chills.  Genitourinary: Negative for dysuria and difficulty urinating.  Musculoskeletal: Positive for arthralgias. Negative for back pain, joint swelling and neck pain.       Right knee pain  Skin: Negative for color change and wound.  All other systems reviewed and are negative.    Allergies   Penicillins  Home Medications   Current Outpatient Rx  Name  Route  Sig  Dispense  Refill  . lurasidone (LATUDA) 80 MG TABS   Oral   Take 80 mg by mouth daily with breakfast.            BP 128/97  Pulse 87  Temp(Src) 98.4 F (36.9 C) (Oral)  Resp 19  SpO2 95% Physical Exam  Nursing note and vitals reviewed. Constitutional: He is oriented to person, place, and time. He appears well-developed and well-nourished. No distress.  Cardiovascular: Normal rate, regular rhythm, normal heart sounds and intact distal pulses.   Pulmonary/Chest: Effort normal and breath sounds normal. No respiratory distress.  Musculoskeletal: Normal range of motion. He exhibits tenderness. He exhibits no edema.  Diffuse ttp of the anterior right knee.  Mild crepitus.  No effusion, erythema or step off deformity.  DP pulse brisk, sensation intact. No calf pain or edema.    Neurological: He is alert and oriented to person, place, and time. He exhibits normal muscle tone. Coordination normal.  Skin: Skin is warm and dry. No erythema.    ED Course  Procedures (including critical care time) Labs Review Labs Reviewed - No data to display Imaging Review Dg Knee Complete 4 Views Right  08/23/2013   CLINICAL DATA:  Pain  EXAM: RIGHT KNEE - COMPLETE 4+ VIEW  COMPARISON:  July 16, 2009  FINDINGS: Frontal, lateral, and bilateral oblique views were obtained. There is no fracture, dislocation, or effusion. There is slight narrowing medially with spurring medially and arising posterior patella. No erosive change.  IMPRESSION: Mild osteoarthritic change. No fracture or effusion.   Electronically Signed   By: Bretta Bang M.D.   On: 08/23/2013 14:30    EKG Interpretation   None       MDM    X-ray results discussed with patient.  No concerning sx's for septic joint. Compartments are soft.    Pt has full ROM of the right knee.  Agrees to f/u with Dr. Hilda Lias.    Quantavis Obryant L. Trisha Mangle, PA-C 08/25/13 2130

## 2013-08-23 NOTE — ED Notes (Signed)
Pt states he fell and landed on right knee few days ago and has had pain since fall, able to ambulate but worse with steps and feels as if knee will give out

## 2013-08-23 NOTE — ED Notes (Signed)
Pt c/o chronic right knee pain. This episode started x 1 week. Nad. Ambulating well. Has taken nothing for the pain. No new injury

## 2013-08-26 NOTE — ED Provider Notes (Signed)
Medical screening examination/treatment/procedure(s) were performed by non-physician practitioner and as supervising physician I was immediately available for consultation/collaboration.  EKG Interpretation   None         Anihya Tuma W. Ajah Vanhoose, MD 08/26/13 0720 

## 2013-11-13 ENCOUNTER — Emergency Department (HOSPITAL_COMMUNITY)
Admission: EM | Admit: 2013-11-13 | Discharge: 2013-11-13 | Disposition: A | Discharge status: Home / Self Care | Payer: Medicaid Other | Attending: Emergency Medicine | Admitting: Emergency Medicine

## 2013-11-13 ENCOUNTER — Encounter (HOSPITAL_COMMUNITY): Payer: Self-pay | Admitting: Emergency Medicine

## 2013-11-13 DIAGNOSIS — R109 Unspecified abdominal pain: Secondary | ICD-10-CM | POA: Insufficient documentation

## 2013-11-13 DIAGNOSIS — N289 Disorder of kidney and ureter, unspecified: Secondary | ICD-10-CM

## 2013-11-13 DIAGNOSIS — R112 Nausea with vomiting, unspecified: Secondary | ICD-10-CM | POA: Insufficient documentation

## 2013-11-13 DIAGNOSIS — G8929 Other chronic pain: Secondary | ICD-10-CM | POA: Insufficient documentation

## 2013-11-13 DIAGNOSIS — N390 Urinary tract infection, site not specified: Secondary | ICD-10-CM | POA: Insufficient documentation

## 2013-11-13 DIAGNOSIS — Z79899 Other long term (current) drug therapy: Secondary | ICD-10-CM | POA: Insufficient documentation

## 2013-11-13 DIAGNOSIS — Z880 Allergy status to penicillin: Secondary | ICD-10-CM | POA: Insufficient documentation

## 2013-11-13 DIAGNOSIS — J45909 Unspecified asthma, uncomplicated: Secondary | ICD-10-CM | POA: Insufficient documentation

## 2013-11-13 DIAGNOSIS — F319 Bipolar disorder, unspecified: Secondary | ICD-10-CM | POA: Insufficient documentation

## 2013-11-13 DIAGNOSIS — R509 Fever, unspecified: Secondary | ICD-10-CM | POA: Insufficient documentation

## 2013-11-13 DIAGNOSIS — F17200 Nicotine dependence, unspecified, uncomplicated: Secondary | ICD-10-CM | POA: Insufficient documentation

## 2013-11-13 LAB — CBC WITH DIFFERENTIAL/PLATELET
BASOS PCT: 1 % (ref 0–1)
Basophils Absolute: 0.1 10*3/uL (ref 0.0–0.1)
EOS ABS: 0.1 10*3/uL (ref 0.0–0.7)
EOS PCT: 1 % (ref 0–5)
HCT: 50.8 % (ref 39.0–52.0)
Hemoglobin: 18.8 g/dL — ABNORMAL HIGH (ref 13.0–17.0)
Lymphocytes Relative: 27 % (ref 12–46)
Lymphs Abs: 2.7 10*3/uL (ref 0.7–4.0)
MCH: 36.2 pg — AB (ref 26.0–34.0)
MCHC: 37 g/dL — AB (ref 30.0–36.0)
MCV: 97.9 fL (ref 78.0–100.0)
Monocytes Absolute: 0.8 10*3/uL (ref 0.1–1.0)
Monocytes Relative: 7 % (ref 3–12)
NEUTROS PCT: 65 % (ref 43–77)
Neutro Abs: 6.6 10*3/uL (ref 1.7–7.7)
PLATELETS: 258 10*3/uL (ref 150–400)
RBC: 5.19 MIL/uL (ref 4.22–5.81)
RDW: 13.7 % (ref 11.5–15.5)
WBC: 10.2 10*3/uL (ref 4.0–10.5)

## 2013-11-13 LAB — URINALYSIS, ROUTINE W REFLEX MICROSCOPIC
Glucose, UA: 100 mg/dL — AB
Hgb urine dipstick: NEGATIVE
NITRITE: POSITIVE — AB
PROTEIN: 100 mg/dL — AB
SPECIFIC GRAVITY, URINE: 1.015 (ref 1.005–1.030)
pH: 7 (ref 5.0–8.0)

## 2013-11-13 LAB — BASIC METABOLIC PANEL
BUN: 14 mg/dL (ref 6–23)
CALCIUM: 9.3 mg/dL (ref 8.4–10.5)
CO2: 26 mEq/L (ref 19–32)
Chloride: 96 mEq/L (ref 96–112)
Creatinine, Ser: 1.6 mg/dL — ABNORMAL HIGH (ref 0.50–1.35)
GFR, EST AFRICAN AMERICAN: 60 mL/min — AB (ref >90)
GFR, EST NON AFRICAN AMERICAN: 52 mL/min — AB (ref >90)
Glucose, Bld: 106 mg/dL — ABNORMAL HIGH (ref 70–99)
POTASSIUM: 3.4 meq/L — AB (ref 3.7–5.3)
SODIUM: 135 meq/L — AB (ref 137–147)

## 2013-11-13 LAB — URINE MICROSCOPIC-ADD ON

## 2013-11-13 MED ORDER — DEXTROSE 5 % IV SOLN
1.0000 g | Freq: Once | INTRAVENOUS | Status: AC
  Administered 2013-11-13: 1 g via INTRAVENOUS
  Filled 2013-11-13: qty 10

## 2013-11-13 MED ORDER — CEPHALEXIN 500 MG PO CAPS: ORAL_CAPSULE | ORAL | Status: DC

## 2013-11-13 MED ORDER — OXYCODONE-ACETAMINOPHEN 5-325 MG PO TABS: 2.0000 | ORAL_TABLET | ORAL | Status: DC | PRN

## 2013-11-13 MED ORDER — ONDANSETRON HCL 4 MG/2ML IJ SOLN
4.0000 mg | Freq: Once | INTRAMUSCULAR | Status: AC
  Administered 2013-11-13: 4 mg via INTRAVENOUS
  Filled 2013-11-13: qty 2

## 2013-11-13 MED ORDER — ONDANSETRON 8 MG PO TBDP: ORAL_TABLET | ORAL | Status: DC

## 2013-11-13 NOTE — ED Notes (Signed)
Pt reports abd pain, n/v, and hematuria x 1 week.

## 2013-11-13 NOTE — ED Provider Notes (Signed)
CSN: [id number]     Arrival date & time 11/13/13  1048 History   First MD Initiated Contact with Patient 11/13/13 1205     Chief Complaint  Patient presents with  . Hematuria  . Emesis    The history is provided by the patient. No language interpreter was used.    HPI Comments: Lance Cohen is a 43 y.o. male with history of bipolar 1 disorder and chronic knee pain presents to the Emergency Department complaining of constant hematuria and mild constant lower abdominal pain onset 1 week ago. He has also had 2-3 episodes of vomiting daily with nausea for the past 1 week. There is associated fever and chills for the past 2-3 days. He denies dizziness, lightheadedness, groin pain, testicular pain, or back pain. He is currently treated for bipolar 1 disorder with Latuda, Zyprexa and Neurontin.    Past Medical History  Diagnosis Date  . Bipolar 1 disorder   . Suicide attempt     2010 Intentional overdose attempt with Depakote.  . Asthma   . Chronic knee pain    Past Surgical History  Procedure Laterality Date  . Foot surgery      had peice of wire removed   No family history on file. History  Substance Use Topics  . Smoking status: Current Every Day Smoker -- 0.50 packs/day  . Smokeless tobacco: Not on file  . Alcohol Use: Yes     Comment: occasionally    Review of Systems 10 Systems reviewed and all are negative for acute change except as noted in the HPI.  Allergies  Penicillins and Sulfa antibiotics  Home Medications   Current Outpatient Rx  Name  Route  Sig  Dispense  Refill  . gabapentin (NEURONTIN) 100 MG capsule   Oral   Take 100 mg by mouth 4 (four) times daily.         Marland Kitchen lurasidone (LATUDA) 80 MG TABS   Oral   Take 80 mg by mouth daily with breakfast.           . OLANZapine (ZYPREXA) 20 MG tablet   Oral   Take 20 mg by mouth daily.         . cephALEXin (KEFLEX) 500 MG capsule      2 caps po bid x 7 days   28 capsule   0   . ondansetron  (ZOFRAN ODT) 8 MG disintegrating tablet      8mg  ODT q4 hours prn nausea   4 tablet   0   . oxyCODONE-acetaminophen (PERCOCET) 5-325 MG per tablet   Oral   Take 2 tablets by mouth every 4 (four) hours as needed.   10 tablet   0    Triage Vitals: BP 130/92  Pulse 87  Temp(Src) 99.3 F (37.4 C) (Oral)  Resp 18  Ht 5\' 10"  (1.778 m)  Wt 270 lb (122.471 kg)  BMI 38.74 kg/m2  SpO2 97% Physical Exam  Nursing note and vitals reviewed. Constitutional:  Awake, alert, nontoxic appearance.  HENT:  Head: Atraumatic.  Eyes: Right eye exhibits no discharge. Left eye exhibits no discharge.  Neck: Neck supple.  Cardiovascular: Normal rate, regular rhythm and normal heart sounds.   No murmur heard. Pulmonary/Chest: Effort normal. He exhibits no tenderness.  Abdominal: Soft. Bowel sounds are normal. There is tenderness. There is no rebound.  No CVA tenderness. Mild diffuse tenderness.   Genitourinary:  Testicles non tender. No hernia palpated.  Musculoskeletal: He exhibits no tenderness.  Baseline ROM, no obvious new focal weakness. Back non tender.  Neurological:  Mental status and motor strength appears baseline for patient and situation.  Skin: No rash noted.  Psychiatric: He has a normal mood and affect.    ED Course  Procedures (including critical care time) DIAGNOSTIC STUDIES: Oxygen Saturation is 97% on room air, adequate by my interpretation.    COORDINATION OF CARE: 12:12 PM- Patient / Family understand and agree with initial ED impression and plan with expectations set for ED visit.     Labs Review Labs Reviewed  CBC WITH DIFFERENTIAL - Abnormal; Notable for the following:    Hemoglobin 18.8 (*)    MCH 36.2 (*)    MCHC 37.0 (*)    All other components within normal limits  BASIC METABOLIC PANEL - Abnormal; Notable for the following:    Sodium 135 (*)    Potassium 3.4 (*)    Glucose, Bld 106 (*)    Creatinine, Ser 1.60 (*)    GFR calc non Af Amer 52 (*)     GFR calc Af Amer 60 (*)    All other components within normal limits  URINALYSIS, ROUTINE W REFLEX MICROSCOPIC - Abnormal; Notable for the following:    Color, Urine ORANGE (*)    APPearance HAZY (*)    Glucose, UA 100 (*)    Bilirubin Urine LARGE (*)    Ketones, ur TRACE (*)    Protein, ur 100 (*)    Urobilinogen, UA >8.0 (*)    Nitrite POSITIVE (*)    Leukocytes, UA SMALL (*)    All other components within normal limits  URINE MICROSCOPIC-ADD ON - Abnormal; Notable for the following:    Squamous Epithelial / LPF FEW (*)    Bacteria, UA FEW (*)    All other components within normal limits  URINE CULTURE   Imaging Review No results found.  EKG Interpretation   None       MDM   1. UTI (urinary tract infection)   2. Renal insufficiency    I doubt any other EMC precluding discharge at this time including, but not necessarily limited to the following:sepsis, renal colic.  I personally performed the services described in this documentation, which was scribed in my presence. The recorded information has been reviewed and is accurate.   Babette Relic, MD 11/13/13 [phone]

## 2013-11-13 NOTE — ED Notes (Addendum)
Pt c/o lower abd pain with n/v, blood in his urine, chills, burning and pain with urination for the past week,

## 2013-11-14 LAB — URINE CULTURE

## 2013-12-08 ENCOUNTER — Emergency Department (HOSPITAL_COMMUNITY): Payer: Medicaid Other

## 2013-12-08 ENCOUNTER — Emergency Department (HOSPITAL_COMMUNITY)
Admission: EM | Admit: 2013-12-08 | Discharge: 2013-12-08 | Disposition: A | Discharge status: Home / Self Care | Payer: Medicaid Other | Attending: Emergency Medicine | Admitting: Emergency Medicine

## 2013-12-08 ENCOUNTER — Encounter (HOSPITAL_COMMUNITY): Payer: Self-pay | Admitting: Emergency Medicine

## 2013-12-08 DIAGNOSIS — J45909 Unspecified asthma, uncomplicated: Secondary | ICD-10-CM | POA: Insufficient documentation

## 2013-12-08 DIAGNOSIS — F319 Bipolar disorder, unspecified: Secondary | ICD-10-CM | POA: Insufficient documentation

## 2013-12-08 DIAGNOSIS — Z79899 Other long term (current) drug therapy: Secondary | ICD-10-CM | POA: Insufficient documentation

## 2013-12-08 DIAGNOSIS — G8929 Other chronic pain: Secondary | ICD-10-CM | POA: Insufficient documentation

## 2013-12-08 DIAGNOSIS — F17200 Nicotine dependence, unspecified, uncomplicated: Secondary | ICD-10-CM | POA: Insufficient documentation

## 2013-12-08 DIAGNOSIS — Z880 Allergy status to penicillin: Secondary | ICD-10-CM | POA: Insufficient documentation

## 2013-12-08 DIAGNOSIS — R110 Nausea: Secondary | ICD-10-CM | POA: Insufficient documentation

## 2013-12-08 DIAGNOSIS — R634 Abnormal weight loss: Secondary | ICD-10-CM

## 2013-12-08 LAB — URINALYSIS, ROUTINE W REFLEX MICROSCOPIC
Bilirubin Urine: NEGATIVE
Glucose, UA: NEGATIVE mg/dL
Hgb urine dipstick: NEGATIVE
Ketones, ur: NEGATIVE mg/dL
LEUKOCYTES UA: NEGATIVE
NITRITE: NEGATIVE
PROTEIN: NEGATIVE mg/dL
SPECIFIC GRAVITY, URINE: 1.01 (ref 1.005–1.030)
UROBILINOGEN UA: 0.2 mg/dL (ref 0.0–1.0)
pH: 5.5 (ref 5.0–8.0)

## 2013-12-08 LAB — CBC WITH DIFFERENTIAL/PLATELET
BASOS ABS: 0 10*3/uL (ref 0.0–0.1)
BASOS PCT: 0 % (ref 0–1)
EOS ABS: 0.1 10*3/uL (ref 0.0–0.7)
Eosinophils Relative: 1 % (ref 0–5)
HCT: 51.4 % (ref 39.0–52.0)
HEMOGLOBIN: 18.5 g/dL — AB (ref 13.0–17.0)
Lymphocytes Relative: 25 % (ref 12–46)
Lymphs Abs: 2.4 10*3/uL (ref 0.7–4.0)
MCH: 36.1 pg — AB (ref 26.0–34.0)
MCHC: 36 g/dL (ref 30.0–36.0)
MCV: 100.2 fL — ABNORMAL HIGH (ref 78.0–100.0)
MONOS PCT: 7 % (ref 3–12)
Monocytes Absolute: 0.6 10*3/uL (ref 0.1–1.0)
NEUTROS ABS: 6.5 10*3/uL (ref 1.7–7.7)
NEUTROS PCT: 68 % (ref 43–77)
PLATELETS: 240 10*3/uL (ref 150–400)
RBC: 5.13 MIL/uL (ref 4.22–5.81)
RDW: 13.8 % (ref 11.5–15.5)
WBC: 9.6 10*3/uL (ref 4.0–10.5)

## 2013-12-08 LAB — COMPREHENSIVE METABOLIC PANEL
ALBUMIN: 3.8 g/dL (ref 3.5–5.2)
ALK PHOS: 95 U/L (ref 39–117)
ALT: 27 U/L (ref 0–53)
AST: 23 U/L (ref 0–37)
BUN: 8 mg/dL (ref 6–23)
CALCIUM: 9.3 mg/dL (ref 8.4–10.5)
CO2: 25 mEq/L (ref 19–32)
Chloride: 99 mEq/L (ref 96–112)
Creatinine, Ser: 1.32 mg/dL (ref 0.50–1.35)
GFR calc non Af Amer: 66 mL/min — ABNORMAL LOW (ref >90)
GFR, EST AFRICAN AMERICAN: 76 mL/min — AB (ref >90)
GLUCOSE: 90 mg/dL (ref 70–99)
POTASSIUM: 4 meq/L (ref 3.7–5.3)
SODIUM: 137 meq/L (ref 137–147)
TOTAL PROTEIN: 7.5 g/dL (ref 6.0–8.3)
Total Bilirubin: 0.9 mg/dL (ref 0.3–1.2)

## 2013-12-08 LAB — LIPASE, BLOOD: Lipase: 20 U/L (ref 11–59)

## 2013-12-08 MED ORDER — IOHEXOL 300 MG/ML  SOLN
100.0000 mL | Freq: Once | INTRAMUSCULAR | Status: AC | PRN
  Administered 2013-12-08: 100 mL via INTRAVENOUS

## 2013-12-08 NOTE — Discharge Instructions (Signed)
Tests showed no obvious reason for your weight loss. Followup your primary care Dr.

## 2013-12-08 NOTE — ED Provider Notes (Signed)
CSN: [id number]     Arrival date & time 12/08/13  1339 History  This chart was scribed for Nat Christen, MD by Ludger Nutting, ED Scribe. This patient was seen in room APA03/APA03 and the patient's care was started 3:43 PM.    Chief Complaint  Patient presents with  . Weight Loss      The history is provided by the patient. No language interpreter was used.    HPI Comments: Lance Cohen is a 43 y.o. male who presents to the Emergency Department complaining of intermittent nausea and weight loss for the past few weeks. Pt states he has an appetite but becomes nauseous each time he eats. He was seen here on 11/13/13 for hematuria and abdominal pain. He was diagnosed with a UTI and discharged home with antibiotics. He reports losing 20 lbs in 1 week. He denies fever, chills, hematuria, dizziness.   PCP [facility]  Past Medical History  Diagnosis Date  . Bipolar 1 disorder   . Suicide attempt     2010 Intentional overdose attempt with Depakote.  . Asthma   . Chronic knee pain    Past Surgical History  Procedure Laterality Date  . Foot surgery      had peice of wire removed   No family history on file. History  Substance Use Topics  . Smoking status: Current Every Day Smoker -- 0.50 packs/day  . Smokeless tobacco: Not on file  . Alcohol Use: Yes     Comment: occasionally    Review of Systems  A complete 10 system review of systems was obtained and all systems are negative except as noted in the HPI and PMH.    Allergies  Bee venom; Penicillins; and Sulfa antibiotics  Home Medications   Current Outpatient Rx  Name  Route  Sig  Dispense  Refill  . gabapentin (NEURONTIN) 100 MG capsule   Oral   Take 100 mg by mouth 4 (four) times daily.         Marland Kitchen lurasidone (LATUDA) 80 MG TABS   Oral   Take 80 mg by mouth daily with breakfast.           . OLANZapine (ZYPREXA) 20 MG tablet   Oral   Take 20 mg by mouth daily.          BP 134/89  Pulse 69   Temp(Src) 98 F (36.7 C) (Oral)  Resp 18  Ht 5\' 10"  (1.778 m)  Wt 266 lb (120.657 kg)  BMI 38.17 kg/m2  SpO2 100% Physical Exam  Nursing note and vitals reviewed. Constitutional: He is oriented to person, place, and time. He appears well-developed and well-nourished.  HENT:  Head: Normocephalic and atraumatic.  Eyes: Conjunctivae and EOM are normal. Pupils are equal, round, and reactive to light.  Neck: Normal range of motion. Neck supple.  Cardiovascular: Normal rate, regular rhythm and normal heart sounds.   Pulmonary/Chest: Effort normal and breath sounds normal.  Abdominal: Soft. Bowel sounds are normal. There is no tenderness.  Slightly obese   Musculoskeletal: Normal range of motion.  Neurological: He is alert and oriented to person, place, and time.  Skin: Skin is warm and dry.  Psychiatric: He has a normal mood and affect. His behavior is normal.    ED Course  Procedures (including critical care time)  DIAGNOSTIC STUDIES: Oxygen Saturation is 98% on RA, normal by my interpretation.    COORDINATION OF CARE: 3:45 PM Will order abdominal CT, lab work,  and UA. Discussed treatment plan with pt at bedside and pt agreed to plan.   Labs Review Labs Reviewed  CBC WITH DIFFERENTIAL - Abnormal; Notable for the following:    Hemoglobin 18.5 (*)    MCV 100.2 (*)    MCH 36.1 (*)    All other components within normal limits  COMPREHENSIVE METABOLIC PANEL - Abnormal; Notable for the following:    GFR calc non Af Amer 66 (*)    GFR calc Af Amer 76 (*)    All other components within normal limits  URINALYSIS, ROUTINE W REFLEX MICROSCOPIC  LIPASE, BLOOD   Imaging Review Dg Chest 2 View  12/08/2013   CLINICAL DATA:  Weight loss, lack of appetite and intermittent nausea.  EXAM: CHEST - 2 VIEW  COMPARISON:  DG CHEST 2 VIEW dated 10/28/2012; DG CHEST 2 VIEW dated 09/07/2010  FINDINGS: The heart size and mediastinal contours are within normal limits. There is no evidence of pulmonary  edema, consolidation, pneumothorax, nodule or pleural fluid. The visualized skeletal structures are unremarkable.  IMPRESSION: No active disease.   Electronically Signed   By: Aletta Edouard M.D.   On: 12/08/2013 17:30   Ct Abdomen Pelvis W Contrast  12/08/2013   CLINICAL DATA:  Nausea, hematuria  EXAM: CT ABDOMEN AND PELVIS WITH CONTRAST  TECHNIQUE: Multidetector CT imaging of the abdomen and pelvis was performed using the standard protocol following bolus administration of intravenous contrast.  CONTRAST:  151mL OMNIPAQUE IOHEXOL 300 MG/ML  SOLN  COMPARISON:  None.  FINDINGS: Visualized lung bases clear. Unremarkable liver, nondilated gallbladder, spleen, adrenal glands, kidneys, pancreas, aorta, portal vein. Stomach, small bowel, and colon nondilated. Normal appendix. Urinary bladder physiologically distended. Left pelvic phleboliths. No ascites. No free air. No adenopathy. No hydronephrosis. Degenerative disc disease L4-5, L5-S1.  IMPRESSION: 1. No acute abdominal process. 2. Degenerative disc disease L4-5, L5-S1.   Electronically Signed   By: Arne Cleveland M.D.   On: 12/08/2013 17:23    EKG Interpretation   None       MDM   Final diagnoses:  Weight loss    Patient is in no acute distress. Screening labs and x-rays were negative for acute findings.  He is ambulatory  I personally performed the services described in this documentation, which was scribed in my presence. The recorded information has been reviewed and is accurate.   Nat Christen, MD 12/08/13 [place]

## 2013-12-08 NOTE — ED Notes (Signed)
Complain  Of unable to eat and loosing weight for a month

## 2014-05-28 ENCOUNTER — Emergency Department (HOSPITAL_COMMUNITY)
Admission: EM | Admit: 2014-05-28 | Discharge: 2014-05-29 | Disposition: A | Discharge status: Home / Self Care | Payer: Medicaid Other | Attending: Emergency Medicine | Admitting: Emergency Medicine

## 2014-05-28 ENCOUNTER — Encounter (HOSPITAL_COMMUNITY): Payer: Self-pay | Admitting: Emergency Medicine

## 2014-05-28 DIAGNOSIS — F17200 Nicotine dependence, unspecified, uncomplicated: Secondary | ICD-10-CM | POA: Insufficient documentation

## 2014-05-28 DIAGNOSIS — Z79899 Other long term (current) drug therapy: Secondary | ICD-10-CM | POA: Diagnosis not present

## 2014-05-28 DIAGNOSIS — Z880 Allergy status to penicillin: Secondary | ICD-10-CM | POA: Diagnosis not present

## 2014-05-28 DIAGNOSIS — F319 Bipolar disorder, unspecified: Secondary | ICD-10-CM | POA: Insufficient documentation

## 2014-05-28 DIAGNOSIS — G8929 Other chronic pain: Secondary | ICD-10-CM | POA: Diagnosis not present

## 2014-05-28 DIAGNOSIS — J45909 Unspecified asthma, uncomplicated: Secondary | ICD-10-CM | POA: Diagnosis not present

## 2014-05-28 DIAGNOSIS — M25561 Pain in right knee: Secondary | ICD-10-CM

## 2014-05-28 DIAGNOSIS — M1710 Unilateral primary osteoarthritis, unspecified knee: Secondary | ICD-10-CM | POA: Diagnosis not present

## 2014-05-28 DIAGNOSIS — M1711 Unilateral primary osteoarthritis, right knee: Secondary | ICD-10-CM

## 2014-05-28 DIAGNOSIS — M25569 Pain in unspecified knee: Secondary | ICD-10-CM | POA: Insufficient documentation

## 2014-05-28 NOTE — ED Notes (Signed)
Pt reports right knee pain & swelling. Bruising noted to knee but pt denies any injury.

## 2014-05-28 NOTE — ED Provider Notes (Signed)
CSN: [id number]     Arrival date & time 05/28/14  2201 History   First MD Initiated Contact with Patient 05/28/14 2337     Chief Complaint  Patient presents with  . Knee Pain     (Consider location/radiation/quality/duration/timing/severity/associated sxs/prior Treatment) HPI Comments: Patient is a 43 year old male who presents to the emergency department with complaint of right knee pain. The patient states that he has had problems with his knees for quite some time. He's been evaluated by orthopedics and told that he had" near bone on bone". He was supposed to go back to orthopedics for another evaluation, but has to go through the health department to get the referral. He presents to the emergency apartment tonight because he has severe pain involving the right knee. His been no recent injury or trauma to the knee. The patient has not had a hot knee reported. There's been no fever or chills reported. He has not measured any high temperature elevations.  The history is provided by the patient.    Past Medical History  Diagnosis Date  . Bipolar 1 disorder   . Suicide attempt     2010 Intentional overdose attempt with Depakote.  . Asthma   . Chronic knee pain    Past Surgical History  Procedure Laterality Date  . Foot surgery      had peice of wire removed   History reviewed. No pertinent family history. History  Substance Use Topics  . Smoking status: Current Every Day Smoker -- 0.50 packs/day  . Smokeless tobacco: Not on file  . Alcohol Use: Yes     Comment: occasionally    Review of Systems  Constitutional: Negative for activity change.       All ROS Neg except as noted in HPI  HENT: Negative for nosebleeds.   Eyes: Negative for photophobia and discharge.  Respiratory: Negative for cough, shortness of breath and wheezing.   Cardiovascular: Negative for chest pain and palpitations.  Gastrointestinal: Negative for abdominal pain and blood in stool.  Genitourinary:  Negative for dysuria, frequency and hematuria.  Musculoskeletal: Positive for arthralgias. Negative for back pain and neck pain.  Skin: Negative.   Neurological: Negative for dizziness, seizures and speech difficulty.  Psychiatric/Behavioral: Negative for hallucinations and confusion.      Allergies  Bee venom; Penicillins; and Sulfa antibiotics  Home Medications   Prior to Admission medications   Medication Sig Start Date End Date Taking? Authorizing Provider  gabapentin (NEURONTIN) 100 MG capsule Take 100 mg by mouth 4 (four) times daily.    Historical Provider, MD  lurasidone (LATUDA) 80 MG TABS Take 80 mg by mouth daily with breakfast.      Historical Provider, MD  OLANZapine (ZYPREXA) 20 MG tablet Take 20 mg by mouth daily.    Historical Provider, MD   BP 124/80  Pulse 78  Temp(Src) 98.2 F (36.8 C) (Oral)  Resp 18  Ht 5\' 11"  (1.803 m)  Wt 270 lb (122.471 kg)  BMI 37.67 kg/m2  SpO2 96% Physical Exam  Nursing note and vitals reviewed. Constitutional: He is oriented to person, place, and time. He appears well-developed and well-nourished.  Non-toxic appearance.  HENT:  Head: Normocephalic.  Right Ear: Tympanic membrane and external ear normal.  Left Ear: Tympanic membrane and external ear normal.  Eyes: EOM and lids are normal. Pupils are equal, round, and reactive to light.  Neck: Normal range of motion. Neck supple. Carotid bruit is not present.  Cardiovascular: Normal rate, regular rhythm,  normal heart sounds, intact distal pulses and normal pulses.   Pulmonary/Chest: Breath sounds normal. No respiratory distress.  Abdominal: Soft. Bowel sounds are normal. There is no tenderness. There is no guarding.  Musculoskeletal: Normal range of motion.  There is good range of motion of the right hip. There is mild effusion of the right knee. There is no deformity of the quadricep area, or the anterior tibial tuberosity. The knee is not hot or red. There is no posterior mass  appreciated. There is pain to palpation of the knee, and even more pain with attempted movement of the right knee. There's no deformity of the tibia area. The Achilles tendon is intact. The dorsalis pedis pulses 2+.  Lymphadenopathy:       Head (right side): No submandibular adenopathy present.       Head (left side): No submandibular adenopathy present.    He has no cervical adenopathy.  Neurological: He is alert and oriented to person, place, and time. He has normal strength. No cranial nerve deficit or sensory deficit.  Skin: Skin is warm and dry.  Psychiatric: He has a normal mood and affect. His speech is normal.    ED Course  Procedures (including critical care time) Labs Review Labs Reviewed - No data to display  Imaging Review No results found.   EKG Interpretation None      MDM Patient has history of chronic knee pain. I have reviewed the previous x-rays of the knee which revealed advancing degenerative joint disease changes. I find no evidence for septic joint at this time. There's certainly no dislocation.  Patient is fitted with a knee immobilizer. Prescription given for dexamethasone, and diclofenac. Patient also encouraged to use Tylenol extra strength in between doses if needed for pain. Patient encouraged to see the physicians at the health department to obtain the proper referral to orthopedics for additional evaluation of this knee problem.    Final diagnoses:  None    *I have reviewed nursing notes, vital signs, and all appropriate lab and imaging results for this patient.Lenox Ahr, PA-C 05/29/14 0009

## 2014-05-28 NOTE — ED Notes (Signed)
Right knee pain and swelling, no known injury per pt.

## 2014-05-29 MED ORDER — PREDNISONE 50 MG PO TABS
60.0000 mg | ORAL_TABLET | Freq: Once | ORAL | Status: AC
  Administered 2014-05-29: 60 mg via ORAL
  Filled 2014-05-29 (×2): qty 1

## 2014-05-29 MED ORDER — DEXAMETHASONE 6 MG PO TABS: ORAL_TABLET | ORAL | Status: DC

## 2014-05-29 MED ORDER — KETOROLAC TROMETHAMINE 10 MG PO TABS
10.0000 mg | ORAL_TABLET | Freq: Once | ORAL | Status: AC
  Administered 2014-05-29: 10 mg via ORAL
  Filled 2014-05-29: qty 1

## 2014-05-29 MED ORDER — DICLOFENAC SODIUM 75 MG PO TBEC: 75.0000 mg | DELAYED_RELEASE_TABLET | Freq: Two times a day (BID) | ORAL | Status: DC

## 2014-05-29 MED ORDER — ACETAMINOPHEN 325 MG PO TABS
650.0000 mg | ORAL_TABLET | Freq: Once | ORAL | Status: AC
  Administered 2014-05-29: 650 mg via ORAL
  Filled 2014-05-29: qty 2

## 2014-05-29 NOTE — Discharge Instructions (Signed)
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 Please use the knee immobilizer for assistance with getting around and your discomfort. Please use Decadron and dalteparin 2 times daily with food. May use Tylenol Extra Strength in between doses if needed for additional pain. It is important that you see the orthopedic specialist for evaluation and management of this particular problem. There is no evidence of a septic joint or emergent changes on tonight's examination. Arthritis, Nonspecific Arthritis is pain, redness, warmth, or puffiness (inflammation) of a joint. The joint may be stiff or hurt when you move it. One or more joints may be affected. There are many types of arthritis. Your doctor may not know what type you have right away. The most common cause of arthritis is wear and tear on the joint (osteoarthritis). HOME CARE   Only take medicine as told by your doctor.  Rest the joint as much as possible.  Raise (elevate) your joint if it is puffy.  Use crutches if the painful joint is in your leg.  Drink enough fluids to keep your pee (urine) clear or pale yellow.  Follow your doctor's diet instructions.  Use cold packs for very bad joint pain for 10 to 15 minutes every hour. Ask your doctor if it is okay for you to use hot packs.  Exercise as told by your doctor.  Take a warm shower if you have stiffness in the morning.  Move your sore joints throughout the day. GET HELP RIGHT AWAY IF:   You have a fever.  You have very bad joint pain, puffiness, or redness.  You have many joints that are painful and puffy.  You are not getting better with treatment.  You have very bad back pain or leg weakness.  You cannot control when you poop (bowel movement) or pee (urinate).  You do not feel better in 24 hours or are getting worse.  You are having side effects from your medicine. MAKE SURE YOU:   Understand these instructions.  Will  watch your condition.  Will get help right away if you are not doing well or get worse. Document Released: 01/01/2010 Document Revised: 04/07/2012 Document Reviewed: 01/01/2010 [facility] Patient Information 2015 [place], [place]. This information is not intended to replace advice given to you by your health care provider. Make sure you discuss any questions you have with your health care provider.

## 2014-05-29 NOTE — ED Provider Notes (Signed)
Medical screening examination/treatment/procedure(s) were performed by non-physician practitioner and as supervising physician I was immediately available for consultation/collaboration.   EKG Interpretation None      Rolland Porter, MD, Abram Sander   Janice Norrie, MD 05/29/14 [phone]

## 2015-04-26 ENCOUNTER — Encounter (HOSPITAL_COMMUNITY): Payer: Self-pay | Admitting: Emergency Medicine

## 2015-04-26 ENCOUNTER — Emergency Department (HOSPITAL_COMMUNITY)
Admission: EM | Admit: 2015-04-26 | Discharge: 2015-04-26 | Disposition: A | Discharge status: Home / Self Care | Payer: Medicaid Other | Attending: Emergency Medicine | Admitting: Emergency Medicine

## 2015-04-26 DIAGNOSIS — L03119 Cellulitis of unspecified part of limb: Secondary | ICD-10-CM

## 2015-04-26 DIAGNOSIS — Z880 Allergy status to penicillin: Secondary | ICD-10-CM | POA: Insufficient documentation

## 2015-04-26 DIAGNOSIS — Z720 Tobacco use: Secondary | ICD-10-CM | POA: Diagnosis not present

## 2015-04-26 DIAGNOSIS — Z79899 Other long term (current) drug therapy: Secondary | ICD-10-CM | POA: Insufficient documentation

## 2015-04-26 DIAGNOSIS — L03116 Cellulitis of left lower limb: Secondary | ICD-10-CM | POA: Diagnosis present

## 2015-04-26 DIAGNOSIS — Z9889 Other specified postprocedural states: Secondary | ICD-10-CM | POA: Insufficient documentation

## 2015-04-26 DIAGNOSIS — Z791 Long term (current) use of non-steroidal anti-inflammatories (NSAID): Secondary | ICD-10-CM | POA: Insufficient documentation

## 2015-04-26 DIAGNOSIS — J45909 Unspecified asthma, uncomplicated: Secondary | ICD-10-CM | POA: Insufficient documentation

## 2015-04-26 DIAGNOSIS — F319 Bipolar disorder, unspecified: Secondary | ICD-10-CM | POA: Insufficient documentation

## 2015-04-26 DIAGNOSIS — G8929 Other chronic pain: Secondary | ICD-10-CM | POA: Insufficient documentation

## 2015-04-26 MED ORDER — IBUPROFEN 800 MG PO TABS: 800.0000 mg | ORAL_TABLET | Freq: Three times a day (TID) | ORAL | Status: DC

## 2015-04-26 MED ORDER — CLINDAMYCIN HCL 150 MG PO CAPS: 300.0000 mg | ORAL_CAPSULE | Freq: Four times a day (QID) | ORAL | Status: DC

## 2015-04-26 NOTE — Discharge Instructions (Signed)

## 2015-04-26 NOTE — ED Provider Notes (Signed)
CSN: [id number]     Arrival date & time 04/26/15  1137 History  This chart was scribed for Lance Greek, MD by Starleen Arms, ED Scribe. This patient was seen in room APA16A/APA16A and the patient's care was started at 12:18 PM.   Chief Complaint  Patient presents with  . Cellulitis   The history is provided by the patient. No language interpreter was used.   HPI Comments: HUBER MATHERS is a 44 y.o. male who presents to the Emergency Department complaining of left foot pain, redness, and warmth onset 3 days ago.  He denies history of prior similar episodes.   He denies injury, abrasion, laceration, fever.  Patient is allergic to penicillins and sulfa antibiotics.    Past Medical History  Diagnosis Date  . Bipolar 1 disorder   . Suicide attempt     2010 Intentional overdose attempt with Depakote.  . Asthma   . Chronic knee pain    Past Surgical History  Procedure Laterality Date  . Foot surgery      had peice of wire removed   No family history on file. History  Substance Use Topics  . Smoking status: Current Every Day Smoker -- 0.50 packs/day  . Smokeless tobacco: Not on file  . Alcohol Use: Yes     Comment: occasionally    Review of Systems  Skin: Positive for color change.  All other systems reviewed and are negative.     Allergies  Bee venom; Penicillins; and Sulfa antibiotics  Home Medications   Prior to Admission medications   Medication Sig Start Date End Date Taking? Authorizing Provider  dexamethasone (DECADRON) 6 MG tablet 1 po bid with food 05/29/14   Lily Kocher, PA-C  diclofenac (VOLTAREN) 75 MG EC tablet Take 1 tablet (75 mg total) by mouth 2 (two) times daily. 05/29/14   Lily Kocher, PA-C  gabapentin (NEURONTIN) 100 MG capsule Take 100 mg by mouth 4 (four) times daily.    Historical Provider, MD  lurasidone (LATUDA) 80 MG TABS Take 80 mg by mouth daily with breakfast.      Historical Provider, MD  OLANZapine (ZYPREXA) 20 MG tablet Take 20 mg  by mouth daily.    Historical Provider, MD   BP 133/87 mmHg  Pulse 69  Temp(Src) 98.6 F (37 C) (Oral)  Resp 18  Ht 5\' 11"  (1.803 m)  Wt 245 lb (111.131 kg)  BMI 34.19 kg/m2  SpO2 99% Physical Exam  Constitutional: He is oriented to person, place, and time. He appears well-developed and well-nourished. No distress.  HENT:  Head: Normocephalic and atraumatic.  Right Ear: Hearing normal.  Left Ear: Hearing normal.  Nose: Nose normal.  Mouth/Throat: Oropharynx is clear and moist and mucous membranes are normal.  Eyes: Conjunctivae and EOM are normal. Pupils are equal, round, and reactive to light.  Neck: Normal range of motion. Neck supple.  Cardiovascular: Regular rhythm, S1 normal and S2 normal.  Exam reveals no gallop and no friction rub.   No murmur heard. Strong DP pulse.   Pulmonary/Chest: Effort normal and breath sounds normal. No respiratory distress. He exhibits no tenderness.  Abdominal: Soft. Normal appearance and bowel sounds are normal. There is no hepatosplenomegaly. There is no tenderness. There is no rebound, no guarding, no tenderness at McBurney's point and negative Murphy's sign. No hernia.  Musculoskeletal: Normal range of motion.  Neurological: He is alert and oriented to person, place, and time. He has normal strength. No cranial nerve deficit or  sensory deficit. Coordination normal. GCS eye subscore is 4. GCS verbal subscore is 5. GCS motor subscore is 6.  Skin: Skin is warm, dry and intact. No rash noted. No cyanosis.  Erythema and warmth on dorsal aspct of left foot.  No induration or fluctuance.    Psychiatric: He has a normal mood and affect. His speech is normal and behavior is normal. Thought content normal.  Nursing note and vitals reviewed.   ED Course  Procedures (including critical care time)  DIAGNOSTIC STUDIES: Oxygen Saturation is 99% on RA, normal by my interpretation.    COORDINATION OF CARE:  12:23 PM Discussed treatment plan with patient  at bedside.  Patient acknowledges and agrees with plan.    Labs Review Labs Reviewed - No data to display  Imaging Review No results found.   EKG Interpretation None      MDM   Final diagnoses:  None   cellulitis  Patient presents to the ER for evaluation of pain and swelling of the left foot. Examination reveals mild erythema with increased warmth. There is no abscess formation. Examination is consistent with cellulitis. Patient is otherwise well-appearing. There is no fever, vital signs are normal. No concern for sepsis. Patient will be initiated on clindamycin. Return if symptoms worsen.  I personally performed the services described in this documentation, which was scribed in my presence. The recorded information has been reviewed and is accurate.     Lance Greek, MD 04/26/15 [phone]

## 2015-04-26 NOTE — ED Notes (Signed)
Pt states he woke up 3 days ago with left foot pain, getting worse,, red swollen, warm to touch

## 2016-10-18 ENCOUNTER — Emergency Department: Payer: Medicaid Other

## 2016-10-18 ENCOUNTER — Inpatient Hospital Stay
Admission: EM | Admit: 2016-10-18 | Discharge: 2016-10-23 | DRG: 270 | Disposition: A | Discharge status: Home / Self Care | Payer: Medicaid Other | Attending: Internal Medicine | Admitting: Internal Medicine

## 2016-10-18 ENCOUNTER — Inpatient Hospital Stay: Payer: Medicaid Other

## 2016-10-18 ENCOUNTER — Encounter: Payer: Self-pay | Admitting: Emergency Medicine

## 2016-10-18 DIAGNOSIS — Z8249 Family history of ischemic heart disease and other diseases of the circulatory system: Secondary | ICD-10-CM

## 2016-10-18 DIAGNOSIS — I10 Essential (primary) hypertension: Secondary | ICD-10-CM | POA: Diagnosis present

## 2016-10-18 DIAGNOSIS — D751 Secondary polycythemia: Secondary | ICD-10-CM | POA: Diagnosis present

## 2016-10-18 DIAGNOSIS — R0602 Shortness of breath: Secondary | ICD-10-CM

## 2016-10-18 DIAGNOSIS — Z915 Personal history of self-harm: Secondary | ICD-10-CM | POA: Diagnosis not present

## 2016-10-18 DIAGNOSIS — I82401 Acute embolism and thrombosis of unspecified deep veins of right lower extremity: Secondary | ICD-10-CM | POA: Diagnosis not present

## 2016-10-18 DIAGNOSIS — Z716 Tobacco abuse counseling: Secondary | ICD-10-CM

## 2016-10-18 DIAGNOSIS — Z86711 Personal history of pulmonary embolism: Secondary | ICD-10-CM

## 2016-10-18 DIAGNOSIS — Z880 Allergy status to penicillin: Secondary | ICD-10-CM

## 2016-10-18 DIAGNOSIS — I82409 Acute embolism and thrombosis of unspecified deep veins of unspecified lower extremity: Secondary | ICD-10-CM | POA: Diagnosis not present

## 2016-10-18 DIAGNOSIS — I82411 Acute embolism and thrombosis of right femoral vein: Principal | ICD-10-CM | POA: Diagnosis present

## 2016-10-18 DIAGNOSIS — J45909 Unspecified asthma, uncomplicated: Secondary | ICD-10-CM | POA: Diagnosis present

## 2016-10-18 DIAGNOSIS — F17210 Nicotine dependence, cigarettes, uncomplicated: Secondary | ICD-10-CM | POA: Diagnosis present

## 2016-10-18 DIAGNOSIS — Z882 Allergy status to sulfonamides status: Secondary | ICD-10-CM

## 2016-10-18 DIAGNOSIS — F319 Bipolar disorder, unspecified: Secondary | ICD-10-CM | POA: Diagnosis present

## 2016-10-18 DIAGNOSIS — I2699 Other pulmonary embolism without acute cor pulmonale: Secondary | ICD-10-CM | POA: Diagnosis present

## 2016-10-18 DIAGNOSIS — Z91030 Bee allergy status: Secondary | ICD-10-CM | POA: Diagnosis not present

## 2016-10-18 DIAGNOSIS — Z79899 Other long term (current) drug therapy: Secondary | ICD-10-CM | POA: Diagnosis not present

## 2016-10-18 DIAGNOSIS — I82509 Chronic embolism and thrombosis of unspecified deep veins of unspecified lower extremity: Secondary | ICD-10-CM | POA: Diagnosis present

## 2016-10-18 DIAGNOSIS — R112 Nausea with vomiting, unspecified: Secondary | ICD-10-CM

## 2016-10-18 DIAGNOSIS — R0902 Hypoxemia: Secondary | ICD-10-CM | POA: Diagnosis present

## 2016-10-18 DIAGNOSIS — Z95828 Presence of other vascular implants and grafts: Secondary | ICD-10-CM

## 2016-10-18 HISTORY — DX: Essential (primary) hypertension: I10

## 2016-10-18 LAB — BASIC METABOLIC PANEL
Anion gap: 8 (ref 5–15)
BUN: 11 mg/dL (ref 6–20)
CO2: 27 mmol/L (ref 22–32)
Calcium: 9.1 mg/dL (ref 8.9–10.3)
Chloride: 98 mmol/L — ABNORMAL LOW (ref 101–111)
Creatinine, Ser: 1.12 mg/dL (ref 0.61–1.24)
GFR calc Af Amer: >60 mL/min (ref >60)
GFR calc non Af Amer: >60 mL/min (ref >60)
Glucose, Bld: 92 mg/dL (ref 65–99)
Potassium: 4.1 mmol/L (ref 3.5–5.1)
Sodium: 133 mmol/L — ABNORMAL LOW (ref 135–145)

## 2016-10-18 LAB — CBC WITH DIFFERENTIAL/PLATELET
Basophils Absolute: 0.1 10*3/uL (ref 0–0.1)
Basophils Relative: 1 %
Eosinophils Absolute: 0.2 10*3/uL (ref 0–0.7)
Eosinophils Relative: 1 %
HCT: 56 % — ABNORMAL HIGH (ref 40.0–52.0)
Hemoglobin: 19.1 g/dL — ABNORMAL HIGH (ref 13.0–18.0)
Lymphocytes Relative: 16 %
Lymphs Abs: 1.9 10*3/uL (ref 1.0–3.6)
MCH: 35.8 pg — ABNORMAL HIGH (ref 26.0–34.0)
MCHC: 34.1 g/dL (ref 32.0–36.0)
MCV: 105 fL — ABNORMAL HIGH (ref 80.0–100.0)
Monocytes Absolute: 0.9 10*3/uL (ref 0.2–1.0)
Monocytes Relative: 8 %
Neutro Abs: 8.8 10*3/uL — ABNORMAL HIGH (ref 1.4–6.5)
Neutrophils Relative %: 74 %
Platelets: 292 10*3/uL (ref 150–440)
RBC: 5.33 MIL/uL (ref 4.40–5.90)
RDW: 14.9 % — ABNORMAL HIGH (ref 11.5–14.5)
WBC: 11.8 10*3/uL — ABNORMAL HIGH (ref 3.8–10.6)

## 2016-10-18 LAB — HEPARIN LEVEL (UNFRACTIONATED): HEPARIN UNFRACTIONATED: 0.36 [IU]/mL (ref 0.30–0.70)

## 2016-10-18 LAB — PROTIME-INR
INR: 1.06
Prothrombin Time: 13.8 seconds (ref 11.4–15.2)

## 2016-10-18 LAB — ANTITHROMBIN III: ANTITHROMB III FUNC: 90 % (ref 75–120)

## 2016-10-18 LAB — APTT: aPTT: 32 seconds (ref 24–36)

## 2016-10-18 MED ORDER — SODIUM CHLORIDE 0.9% FLUSH: 3.0000 mL | INTRAVENOUS | Status: DC | PRN

## 2016-10-18 MED ORDER — SODIUM CHLORIDE 0.9 % IV SOLN
250.0000 mL | INTRAVENOUS | Status: DC | PRN
Start: 2016-10-18 — End: 2016-10-23

## 2016-10-18 MED ORDER — IBUPROFEN 400 MG PO TABS
400.0000 mg | ORAL_TABLET | Freq: Four times a day (QID) | ORAL | Status: DC | PRN
  Administered 2016-10-18 – 2016-10-21 (×7): 400 mg via ORAL
  Filled 2016-10-18 (×7): qty 1

## 2016-10-18 MED ORDER — ACETAMINOPHEN 650 MG RE SUPP: 650.0000 mg | Freq: Four times a day (QID) | RECTAL | Status: DC | PRN

## 2016-10-18 MED ORDER — POLYETHYLENE GLYCOL 3350 17 G PO PACK: 17.0000 g | PACK | Freq: Every day | ORAL | Status: DC | PRN

## 2016-10-18 MED ORDER — SODIUM CHLORIDE 0.9 % IV SOLN
INTRAVENOUS | Status: AC
  Administered 2016-10-18 – 2016-10-19 (×2): via INTRAVENOUS

## 2016-10-18 MED ORDER — IOPAMIDOL (ISOVUE-370) INJECTION 76%
75.0000 mL | Freq: Once | INTRAVENOUS | Status: AC | PRN
  Administered 2016-10-18: 75 mL via INTRAVENOUS
  Filled 2016-10-18: qty 75

## 2016-10-18 MED ORDER — ONDANSETRON HCL 4 MG/2ML IJ SOLN
4.0000 mg | Freq: Four times a day (QID) | INTRAMUSCULAR | Status: DC | PRN
  Administered 2016-10-19 – 2016-10-23 (×2): 4 mg via INTRAVENOUS
  Filled 2016-10-18 (×3): qty 2

## 2016-10-18 MED ORDER — HEPARIN BOLUS VIA INFUSION
5000.0000 [IU] | Freq: Once | INTRAVENOUS | Status: AC
  Administered 2016-10-18: 5000 [IU] via INTRAVENOUS
  Filled 2016-10-18: qty 5000

## 2016-10-18 MED ORDER — ONDANSETRON HCL 4 MG PO TABS: 4.0000 mg | ORAL_TABLET | Freq: Four times a day (QID) | ORAL | Status: DC | PRN

## 2016-10-18 MED ORDER — OXYCODONE-ACETAMINOPHEN 7.5-325 MG PO TABS
1.0000 | ORAL_TABLET | ORAL | Status: DC | PRN
  Administered 2016-10-18 – 2016-10-23 (×23): 1 via ORAL
  Filled 2016-10-18 (×23): qty 1

## 2016-10-18 MED ORDER — AMLODIPINE BESYLATE 5 MG PO TABS
5.0000 mg | ORAL_TABLET | Freq: Every day | ORAL | Status: DC
  Administered 2016-10-18 – 2016-10-22 (×5): 5 mg via ORAL
  Filled 2016-10-18 (×5): qty 1

## 2016-10-18 MED ORDER — DOCUSATE SODIUM 100 MG PO CAPS
100.0000 mg | ORAL_CAPSULE | Freq: Two times a day (BID) | ORAL | Status: DC
  Administered 2016-10-18 – 2016-10-23 (×10): 100 mg via ORAL
  Filled 2016-10-18 (×10): qty 1

## 2016-10-18 MED ORDER — ALBUTEROL SULFATE (2.5 MG/3ML) 0.083% IN NEBU: 2.5000 mg | INHALATION_SOLUTION | RESPIRATORY_TRACT | Status: DC | PRN

## 2016-10-18 MED ORDER — SODIUM CHLORIDE 0.9% FLUSH
3.0000 mL | Freq: Two times a day (BID) | INTRAVENOUS | Status: DC
  Administered 2016-10-18 – 2016-10-23 (×7): 3 mL via INTRAVENOUS

## 2016-10-18 MED ORDER — HEPARIN (PORCINE) IN NACL 100-0.45 UNIT/ML-% IJ SOLN
1700.0000 [IU]/h | INTRAMUSCULAR | Status: DC
  Administered 2016-10-18 – 2016-10-21 (×6): 1700 [IU]/h via INTRAVENOUS
  Filled 2016-10-18 (×12): qty 250

## 2016-10-18 MED ORDER — ACETAMINOPHEN 325 MG PO TABS
650.0000 mg | ORAL_TABLET | Freq: Four times a day (QID) | ORAL | Status: DC | PRN
  Administered 2016-10-22: 650 mg via ORAL
  Filled 2016-10-18: qty 2

## 2016-10-18 NOTE — ED Notes (Signed)
Admitting MD at bedside.

## 2016-10-18 NOTE — ED Triage Notes (Signed)
States he developed right lower leg pain which radiates posteriorly behid knee and into groin area  Denies any injury

## 2016-10-18 NOTE — H&P (Signed)
[facility] [place] NAME: Lance Cohen    MR#:  [id number]  DATE OF BIRTH:  September 04, 1971  DATE OF ADMISSION:  10/18/2016  PRIMARY CARE PHYSICIAN: No PCP Per Patient   REQUESTING/REFERRING PHYSICIAN: Dr. Corky Downs  CHIEF COMPLAINT:   Chief Complaint  Patient presents with  . Leg Pain    HISTORY OF PRESENT ILLNESS:  Lance Cohen  is a 45 y.o. male with a known history of Hypertension, bipolar disorder, tobacco use presents to the emergency room complaining of 2 weeks of right lower extremity pain extending from thigh to his toes. He has also noticed swelling of his right lower extremity. Today in the emergency room an ultrasound venous Doppler was checked which showed extensive DVT involving all the veins. He has not had any recent flight journey [place] trips. No recent hospitalization or surgeries. No history of cancer. He had left-sided upper back pain and shortness of breath 1 day back which has resolved at this time.  PAST MEDICAL HISTORY:   Past Medical History:  Diagnosis Date  . Asthma   . Bipolar 1 disorder ([place])   . Chronic knee pain   . Hypertension   . Suicide attempt    2010 Intentional overdose attempt with Depakote.    PAST SURGICAL HISTORY:   Past Surgical History:  Procedure Laterality Date  . FOOT SURGERY     had peice of wire removed    SOCIAL HISTORY:   Social History  Substance Use Topics  . Smoking status: Current Every Day Smoker    Packs/day: 0.50  . Smokeless tobacco: Never Used  . Alcohol use Yes     Comment: occasionally    FAMILY HISTORY:   Family History  Problem Relation Age of Onset  . Cancer Mother   . Heart attack Father     DRUG ALLERGIES:   Allergies  Allergen Reactions  . Bee Venom Swelling  . Penicillins Other (See Comments)    PT states that he had symptoms that felt like he was having a heart attack Has patient had a PCN reaction causing immediate rash, facial/tongue/throat  swelling, SOB or lightheadedness with hypotension: No Has patient had a PCN reaction causing severe rash involving mucus membranes or skin necrosis: No Has patient had a PCN reaction that required hospitalization No Has patient had a PCN reaction occurring within the last 10 years: No If all of the above answers are "NO", then may proceed with Cephal  . Sulfa Antibiotics Itching    REVIEW OF SYSTEMS:   Review of Systems  Constitutional: Positive for malaise/fatigue. Negative for chills, fever and weight loss.  HENT: Negative for hearing loss and nosebleeds.   Eyes: Negative for blurred vision, double vision and pain.  Respiratory: Positive for shortness of breath. Negative for cough, hemoptysis, sputum production and wheezing.   Cardiovascular: Positive for chest pain. Negative for palpitations, orthopnea and leg swelling.  Gastrointestinal: Negative for abdominal pain, constipation, diarrhea, nausea and vomiting.  Genitourinary: Negative for dysuria and hematuria.  Musculoskeletal: Positive for joint pain. Negative for back pain, falls and myalgias.  Skin: Negative for rash.  Neurological: Negative for dizziness, tremors, sensory change, speech change, focal weakness, seizures and headaches.  Endo/Heme/Allergies: Does not bruise/bleed easily.  Psychiatric/Behavioral: Negative for depression and memory loss. The patient is not nervous/anxious.     MEDICATIONS AT HOME:   Prior to Admission medications   Medication Sig Start Date End Date Taking? Authorizing Provider  clindamycin (CLEOCIN)  150 MG capsule Take 2 capsules (300 mg total) by mouth 4 (four) times daily. 04/26/15   Orpah Greek, MD  ibuprofen (ADVIL,MOTRIN) 800 MG tablet Take 1 tablet (800 mg total) by mouth 3 (three) times daily. 04/26/15   Orpah Greek, MD     VITAL SIGNS:  Blood pressure 140/80, pulse 80, temperature 97.6 F (36.4 C), temperature source Oral, resp. rate 16, height 5\' 11"  (1.803 m), weight  108.9 kg (240 lb), SpO2 97 %.  PHYSICAL EXAMINATION:  Physical Exam  GENERAL:  45 y.o.-year-old patient lying in the bed with no acute distress.  EYES: Pupils equal, round, reactive to light and accommodation. No scleral icterus. Extraocular muscles intact.  HEENT: Head atraumatic, normocephalic. Oropharynx and nasopharynx clear. No oropharyngeal erythema, moist oral mucosa  NECK:  Supple, no jugular venous distention. No thyroid enlargement, no tenderness.  LUNGS: Normal breath sounds bilaterally, no wheezing, rales, rhonchi. No use of accessory muscles of respiration.  CARDIOVASCULAR: S1, S2 normal. No murmurs, rubs, or gallops.  ABDOMEN: Soft, nontender, nondistended. Bowel sounds present. No organomegaly or mass.  EXTREMITIES: Right lower extremity edema and tenderness. NEUROLOGIC: Cranial nerves II through XII are intact. No focal Motor or sensory deficits appreciated b/l PSYCHIATRIC: The patient is alert and oriented x 3. Good affect.  SKIN: No obvious rash, lesion, or ulcer.   LABORATORY PANEL:   CBC  Recent Labs Lab 10/18/16 1444  WBC 11.8*  HGB 19.1*  HCT 56.0*  PLT 292   ------------------------------------------------------------------------------------------------------------------  Chemistries   Recent Labs Lab 10/18/16 1444  NA 133*  K 4.1  CL 98*  CO2 27  GLUCOSE 92  BUN 11  CREATININE 1.12  CALCIUM 9.1   ------------------------------------------------------------------------------------------------------------------  Cardiac Enzymes No results for input(s): TROPONINI in the last 168 hours. ------------------------------------------------------------------------------------------------------------------  RADIOLOGY:  US Venous Img Lower Unilateral Right  Result Date: 10/18/2016 CLINICAL DATA:  Right calf pain and edema for the past 3 weeks. History of smoking. Evaluate for DVT. EXAM: RIGHT LOWER EXTREMITY VENOUS DOPPLER ULTRASOUND TECHNIQUE:  Gray-scale sonography with graded compression, as well as color Doppler and duplex ultrasound were performed to evaluate the lower extremity deep venous systems from the level of the common femoral vein and including the common femoral, femoral, profunda femoral, popliteal and calf veins including the posterior tibial, peroneal and gastrocnemius veins when visible. The superficial great saphenous vein was also interrogated. Spectral Doppler was utilized to evaluate flow at rest and with distal augmentation maneuvers in the common femoral, femoral and popliteal veins. COMPARISON:  None. FINDINGS: Contralateral Common Femoral Vein: Respiratory phasicity is normal and symmetric with the symptomatic side. No evidence of thrombus. Normal compressibility. There is mixed echogenic near occlusive thrombus within the right common femoral vein (images 9 and 10). The saphenofemoral junction appears patent. There is mixed echogenic occlusive thrombus within the imaged portions of the deep femoral vein (image 17) extending from the proximal superficial femoral vein (image 19) through its distal most aspect (image 28), and extending through the right popliteal vein (images 20 and 32) into the imaged portions of the right posterior tibial and peroneal veins. Other Findings:  None. IMPRESSION: Examination is positive for extensive, largely occlusive mixed echogenic thrombus extending from the right common femoral vein through all the imaged venous segments of the right lower extremity. Critical Value/emergent results were called by telephone at the time of interpretation on 10/18/2016 at 2:38 pm to Dr. Corky Downs, who verbally acknowledged these results. Electronically Signed   By: Sandi Mariscal  M.D.   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 Start heparin drip. Pharmacy to dose. Patient will be nothing by  mouth. Ordered hypercoagulable panel. He did have upper back pain with shortness of breath 1 day back that seems to have resolved at this time. Due to extensive DVT will get a CTA of the chest to rule out pulmonary embolism.  * Hypertension Not on medications in many years. We'll start him on low-dose Norvasc.   All the records are reviewed and case discussed with ED provider. Management plans discussed with the patient, family and they are in agreement.  CODE STATUS: FULL CODE  TOTAL TIME TAKING CARE OF THIS PATIENT: 40 minutes.   Hillary Bow R M.D on 10/18/2016 at 3:38 PM  Between 7am to 6pm - Pager - [phone]  After 6pm go to [email or web address] - password [email or web address] [facility]  Office  [phone]  CC: Primary care physician; No PCP Per Patient  Note: This dictation was prepared with Dragon dictation along with smaller phrase technology. Any transcriptional errors that result from this process are unintentional.

## 2016-10-18 NOTE — ED Provider Notes (Signed)
[facility] Emergency Department Provider Note  ____________________________________________  Time seen: Approximately 12:58 PM  I have reviewed the triage vital signs and the nursing notes.   HISTORY  Chief Complaint Leg Pain    HPI Lance Cohen is a 45 y.o. male presenting to the emergency department with right calf pain for the past 3 weeks. Patient describes pain as 10 out of 10 in intensity and sharp. Pain radiates to patient's posterior knee. Patient is unable to source an identifiable trigger for pain. He denies falls or traumas of any kind.  Patient ambulates at baseline.  He denies recreational drug use. He is an every day smoker. He denies recent travel, prolonged immobilization or recent surgery. No prior surgeries to the right lower extremity. Patient has used heat, which has provided minimal relief.   Past Medical History:  Diagnosis Date  . Asthma   . Bipolar 1 disorder ([place])   . Chronic knee pain   . Suicide attempt    2010 Intentional overdose attempt with Depakote.    Patient Active Problem List   Diagnosis Date Noted  . Cellulitis of foot 06/24/2011  . Bipolar 1 disorder ([place]) 06/24/2011  . Tobacco abuse 06/24/2011  . Polycythemia 06/24/2011  . Tinea pedis 06/24/2011  . CARPAL TUNNEL SYNDROME, RIGHT 06/21/2010  . CONTUSION, LEFT FOOT 06/05/2010  . FOREIGN BODY, FOOT 04/17/2010    Past Surgical History:  Procedure Laterality Date  . FOOT SURGERY     had peice of wire removed    Prior to Admission medications   Medication Sig Start Date End Date Taking? Authorizing Provider  clindamycin (CLEOCIN) 150 MG capsule Take 2 capsules (300 mg total) by mouth 4 (four) times daily. 04/26/15   Orpah Greek, MD  gabapentin (NEURONTIN) 100 MG capsule Take 100 mg by mouth 4 (four) times daily.    Historical Provider, MD  ibuprofen (ADVIL,MOTRIN) 800 MG tablet Take 1 tablet (800 mg total) by mouth 3 (three) times daily. 04/26/15    Orpah Greek, MD  lurasidone (LATUDA) 80 MG TABS Take 80 mg by mouth daily with breakfast.      Historical Provider, MD  OLANZapine (ZYPREXA) 20 MG tablet Take 20 mg by mouth daily.    Historical Provider, MD    Allergies Bee venom; Penicillins; and Sulfa antibiotics  No family history on file.  Social History Social History  Substance Use Topics  . Smoking status: Current Every Day Smoker    Packs/day: 0.50  . Smokeless tobacco: Never Used  . Alcohol use Yes     Comment: occasionally     Review of Systems  Constitutional: No fever/chills ENT: No upper respiratory complaints. Cardiovascular: no chest pain. Respiratory: no cough. No SOB. Gastrointestinal: No abdominal pain.   Musculoskeletal: Patient has right calf pain.  Skin: No erythema of the skin overlying the right calf. Neurological: Negative for focal weakness or numbness. 10-point ROS otherwise negative.  ____________________________________________   PHYSICAL EXAM:  VITAL SIGNS: ED Triage Vitals [10/18/16 1235]  Enc Vitals Group     BP (!) 147/86     Pulse Rate 89     Resp 16     Temp 97.6 F (36.4 C)     Temp Source Oral     SpO2 97 %     Weight 240 lb (108.9 kg)     Height 5\' 11"  (1.803 m)     Head Circumference      Peak Flow  Pain Score 10     Pain Loc      Pain Edu?      Excl. in [place]?      Constitutional: Alert and oriented. Patient is in a wheelchair. Head: Atraumatic. Neck: FROM.  Cardiovascular: Normal rate, regular rhythm. Normal S1 and S2.  Good peripheral circulation. Respiratory: Normal respiratory effort without tachypnea or retractions. Lungs CTAB. Good air entry to the bases with no decreased or absent breath sounds. Musculoskeletal: Patient has 5 out of 5 strength in the lower extremities bilaterally. Patient has full range of motion at the right hip, right knee and right ankle. However, pain is elicited with dorsiflexion of the right ankle. Patient has tenderness to  palpation diffusely over right calf. Palpable dorsalis pedis pulse, right.  Neurologic:  Normal speech and language. No gross focal neurologic deficits are appreciated. Reflexes are 2+ and symmetric in the lower extremities bilaterally. Skin:  No erythema of the skin overlying the right lower extremity. Psychiatric: Mood and affect are normal. Speech and behavior are normal. Patient exhibits appropriate insight and judgement.   ____________________________________________   LABS (all labs ordered are listed, but only abnormal results are displayed)  Labs Reviewed  CBC WITH DIFFERENTIAL/PLATELET - Abnormal; Notable for the following:       Result Value   WBC 11.8 (*)    Hemoglobin 19.1 (*)    HCT 56.0 (*)    MCV 105.0 (*)    MCH 35.8 (*)    RDW 14.9 (*)    Neutro Abs 8.8 (*)    All other components within normal limits  BASIC METABOLIC PANEL  APTT  PROTIME-INR   ____________________________________________  EKG   ____________________________________________  RADIOLOGY Unk Pinto, personally viewed and evaluated these images as part of my medical decision making, as well as reviewing the written report by the radiologist.  US Venous Img Lower Unilateral Right  Result Date: 10/18/2016 CLINICAL DATA:  Right calf pain and edema for the past 3 weeks. History of smoking. Evaluate for DVT. EXAM: RIGHT LOWER EXTREMITY VENOUS DOPPLER ULTRASOUND TECHNIQUE: Gray-scale sonography with graded compression, as well as color Doppler and duplex ultrasound were performed to evaluate the lower extremity deep venous systems from the level of the common femoral vein and including the common femoral, femoral, profunda femoral, popliteal and calf veins including the posterior tibial, peroneal and gastrocnemius veins when visible. The superficial great saphenous vein was also interrogated. Spectral Doppler was utilized to evaluate flow at rest and with distal augmentation maneuvers in the common  femoral, femoral and popliteal veins. COMPARISON:  None. FINDINGS: Contralateral Common Femoral Vein: Respiratory phasicity is normal and symmetric with the symptomatic side. No evidence of thrombus. Normal compressibility. There is mixed echogenic near occlusive thrombus within the right common femoral vein (images 9 and 10). The saphenofemoral junction appears patent. There is mixed echogenic occlusive thrombus within the imaged portions of the deep femoral vein (image 17) extending from the proximal superficial femoral vein (image 19) through its distal most aspect (image 28), and extending through the right popliteal vein (images 20 and 32) into the imaged portions of the right posterior tibial and peroneal veins. Other Findings:  None. IMPRESSION: Examination is positive for extensive, largely occlusive mixed echogenic thrombus extending from the right common femoral vein through all the imaged venous segments of the right lower extremity. Critical Value/emergent results were called by telephone at the time of interpretation on 10/18/2016 at 2:38 pm to Dr. Corky Downs, who verbally acknowledged these  results. Electronically Signed   By: Sandi Mariscal M.D.   On: 10/18/2016 14:39   ____________________________________________    PROCEDURES  Procedure(s) performed:    Procedures   Medications  heparin bolus via infusion 5,000 Units (not administered)  heparin ADULT infusion 100 units/mL (25000 units/256mL sodium chloride 0.45%) (not administered)     ____________________________________________   INITIAL IMPRESSION / ASSESSMENT AND PLAN / ED COURSE  Pertinent labs & imaging results that were available during my care of the patient were reviewed by me and considered in my medical decision making (see chart for details).  Review of the [place] CSRS was performed in accordance of the [place] prior to dispensing any controlled drugs.  Clinical Course     Assessment and plan:  Patient presents with a  10/10 right calf pain for the past 3 weeks. Patient is a daily smoker. He denies recent travel, recent surgery, history of DVT or prolonged immobilization. Patient ambulates at baseline. No erythema of the skin overlying the right calf. However, patient's right calf is diffusely tender to palpation. Ultrasound of the right lower extremity reveals large thrombus extending from the right common femoral vein. Patient was started on heparin per pharmacy. My supervising physician, Dr. Lavonia Drafts was consulted and he agrees with current plan of care. Hospitalist team was consulted for possible admission and further workup.   ____________________________________________  FINAL CLINICAL IMPRESSION(S) / ED DIAGNOSES  Final diagnoses:  Acute deep vein thrombosis (DVT) of femoral vein of right lower extremity ([place])      NEW MEDICATIONS STARTED DURING THIS VISIT:  New Prescriptions   No medications on file        This chart was dictated using voice recognition software/Dragon. Despite best efforts to proofread, errors can occur which can change the meaning. Any change was purely unintentional.    Lannie Fields, PA-C 10/18/16 1522    Lavonia Drafts, MD 10/18/16 [phone]

## 2016-10-18 NOTE — Progress Notes (Signed)
ANTICOAGULATION CONSULT NOTE - Initial Consult  Pharmacy Consult for Heparin drip Indication: DVT  Allergies  Allergen Reactions  . Bee Venom Swelling  . Penicillins Other (See Comments)    Heart attack symptoms  . Sulfa Antibiotics Itching    Patient Measurements: Height: 5\' 11"  (180.3 cm) Weight: 240 lb (108.9 kg) IBW/kg (Calculated) : 75.3 Heparin Dosing Weight: 98.5 kg  Vital Signs: Temp: 97.6 F (36.4 C) (12/29 1235) Temp Source: Oral (12/29 1235) BP: 140/80 (12/29 1507) Pulse Rate: 80 (12/29 1507)  Labs:  Recent Labs  10/18/16 1444  HGB 19.1*  HCT 56.0*  PLT 292    CrCl cannot be calculated (Patient's most recent lab result is older than the maximum 21 days allowed.).   Medical History: Past Medical History:  Diagnosis Date  . Asthma   . Bipolar 1 disorder ([place])   . Chronic knee pain   . Suicide attempt    2010 Intentional overdose attempt with Depakote.    Assessment: 45 y/o M with a h/o asthma and bipolar disorder with calf pain x 3 weeks to begin IV heparin infusion for DVT. Per patient, no anticoagulants PTA.   Goal of Therapy:  Heparin level 0.3-0.7 units/ml Monitor platelets by anticoagulation protocol: Yes   Plan:  Give 5000 units bolus x 1 Start heparin infusion at 1700 units/hr Check anti-Xa level in 6 hours and daily while on heparin Continue to monitor H&H and platelets  Ulice Dash D 10/18/2016,3:12 PM

## 2016-10-18 NOTE — Progress Notes (Addendum)
ANTICOAGULATION CONSULT NOTE - Initial Consult  Pharmacy Consult for Heparin drip Indication: DVT  Allergies  Allergen Reactions  . Bee Venom Swelling  . Penicillins Other (See Comments)    PT states that he had symptoms that felt like he was having a heart attack Has patient had a PCN reaction causing immediate rash, facial/tongue/throat swelling, SOB or lightheadedness with hypotension: No Has patient had a PCN reaction causing severe rash involving mucus membranes or skin necrosis: No Has patient had a PCN reaction that required hospitalization No Has patient had a PCN reaction occurring within the last 10 years: No If all of the above answers are "NO", then may proceed with Cephal  . Sulfa Antibiotics Itching    Patient Measurements: Height: 5\' 11"  (180.3 cm) Weight: 240 lb (108.9 kg) IBW/kg (Calculated) : 75.3 Heparin Dosing Weight: 98.5 kg  Vital Signs: Temp: 98.2 F (36.8 C) (12/29 2046) Temp Source: Oral (12/29 2046) BP: 119/86 (12/29 2046) Pulse Rate: 81 (12/29 2046)  Labs:  Recent Labs  10/18/16 1444 10/18/16 2141  HGB 19.1*  --   HCT 56.0*  --   PLT 292  --   APTT 32  --   LABPROT 13.8  --   INR 1.06  --   HEPARINUNFRC  --  0.36  CREATININE 1.12  --     Estimated Creatinine Clearance: 105.6 mL/min (by C-G formula based on SCr of 1.12 mg/dL).   Medical History: Past Medical History:  Diagnosis Date  . Asthma   . Bipolar 1 disorder ([place])   . Chronic knee pain   . Hypertension   . Suicide attempt    2010 Intentional overdose attempt with Depakote.    Assessment: 45 y/o M with a h/o asthma and bipolar disorder with calf pain x 3 weeks to begin IV heparin infusion for DVT. Per patient, no anticoagulants PTA.   Goal of Therapy:  Heparin level 0.3-0.7 units/ml Monitor platelets by anticoagulation protocol: Yes   Plan:  Give 5000 units bolus x 1 Start heparin infusion at 1700 units/hr Check anti-Xa level in 6 hours and daily while on  heparin Continue to monitor H&H and platelets   12/29:  HL @ 22:00 = 0.36 Will continue this pt on current rate of 1700 units/hr and recheck HL on 12/30 @ 0400.   12/30 AM heparin level 0.44. Continue current regimen. Recheck heparin level and CBC tomorrow AM.  Violeta Gelinas D 10/18/2016,10:32 PM

## 2016-10-18 NOTE — Care Management (Signed)
Patient has Medicaid which covers most Brand Name Medications including Lovenox and Xarelto.

## 2016-10-19 DIAGNOSIS — I82409 Acute embolism and thrombosis of unspecified deep veins of unspecified lower extremity: Secondary | ICD-10-CM

## 2016-10-19 LAB — CBC
HCT: 49.6 % (ref 40.0–52.0)
Hemoglobin: 17.2 g/dL (ref 13.0–18.0)
MCH: 36.5 pg — ABNORMAL HIGH (ref 26.0–34.0)
MCHC: 34.6 g/dL (ref 32.0–36.0)
MCV: 105.5 fL — AB (ref 80.0–100.0)
PLATELETS: 269 10*3/uL (ref 150–440)
RBC: 4.71 MIL/uL (ref 4.40–5.90)
RDW: 15.1 % — ABNORMAL HIGH (ref 11.5–14.5)
WBC: 9.4 10*3/uL (ref 3.8–10.6)

## 2016-10-19 LAB — PROTEIN C ACTIVITY: Protein C Activity: 69 % — ABNORMAL LOW (ref 73–180)

## 2016-10-19 LAB — PROTEIN S ACTIVITY: Protein S Activity: 141 % — ABNORMAL HIGH (ref 63–140)

## 2016-10-19 LAB — BASIC METABOLIC PANEL
Anion gap: 6 (ref 5–15)
BUN: 12 mg/dL (ref 6–20)
CHLORIDE: 105 mmol/L (ref 101–111)
CO2: 23 mmol/L (ref 22–32)
CREATININE: 1.07 mg/dL (ref 0.61–1.24)
Calcium: 7.9 mg/dL — ABNORMAL LOW (ref 8.9–10.3)
GFR calc non Af Amer: >60 mL/min (ref >60)
Glucose, Bld: 108 mg/dL — ABNORMAL HIGH (ref 65–99)
POTASSIUM: 3.5 mmol/L (ref 3.5–5.1)
SODIUM: 134 mmol/L — AB (ref 135–145)

## 2016-10-19 LAB — LUPUS ANTICOAGULANT PANEL
DRVVT: 62.7 s — ABNORMAL HIGH (ref 0.0–47.0)
PTT LA: 45.4 s (ref 0.0–51.9)

## 2016-10-19 LAB — PROTEIN S, TOTAL: Protein S Ag, Total: 91 % (ref 60–150)

## 2016-10-19 LAB — HEPARIN LEVEL (UNFRACTIONATED): Heparin Unfractionated: 0.44 IU/mL (ref 0.30–0.70)

## 2016-10-19 LAB — DRVVT MIX: DRVVT MIX: 45.3 s (ref 0.0–47.0)

## 2016-10-19 MED ORDER — ALUM & MAG HYDROXIDE-SIMETH 200-200-20 MG/5ML PO SUSP
30.0000 mL | Freq: Four times a day (QID) | ORAL | Status: DC | PRN
  Administered 2016-10-19: 30 mL via ORAL
  Filled 2016-10-19: qty 30

## 2016-10-19 MED ORDER — NICOTINE 21 MG/24HR TD PT24
21.0000 mg | MEDICATED_PATCH | Freq: Every day | TRANSDERMAL | Status: DC
  Administered 2016-10-19 – 2016-10-20 (×2): 21 mg via TRANSDERMAL
  Filled 2016-10-19 (×2): qty 1

## 2016-10-19 NOTE — Progress Notes (Signed)
[facility] [place] NAME: Lance Cohen    MRN#:  [id number]  DATE OF BIRTH:  1971/09/22  SUBJECTIVE:  Hospital Day: 1 day Emerik Cohen is a 45 y.o. male presenting with Leg Pain .   Overnight events: No overnight events Interval Events: No complaints other than "I want to smoke"  REVIEW OF SYSTEMS:  CONSTITUTIONAL: No fever, fatigue or weakness.  EYES: No blurred or double vision.  EARS, NOSE, AND THROAT: No tinnitus or ear pain.  RESPIRATORY: No cough, shortness of breath, wheezing or hemoptysis.  CARDIOVASCULAR: No chest pain, orthopnea, edema.  GASTROINTESTINAL: No nausea, vomiting, diarrhea or abdominal pain.  GENITOURINARY: No dysuria, hematuria.  ENDOCRINE: No polyuria, nocturia,  HEMATOLOGY: No anemia, easy bruising or bleeding SKIN: No rash or lesion. MUSCULOSKELETAL: No joint pain or arthritis.   NEUROLOGIC: No tingling, numbness, weakness.  PSYCHIATRY: No anxiety or depression.   DRUG ALLERGIES:   Allergies  Allergen Reactions  . Bee Venom Swelling  . Penicillins Other (See Comments)    PT states that he had symptoms that felt like he was having a heart attack Has patient had a PCN reaction causing immediate rash, facial/tongue/throat swelling, SOB or lightheadedness with hypotension: No Has patient had a PCN reaction causing severe rash involving mucus membranes or skin necrosis: No Has patient had a PCN reaction that required hospitalization No Has patient had a PCN reaction occurring within the last 10 years: No If all of the above answers are "NO", then may proceed with Cephal  . Sulfa Antibiotics Itching    VITALS:  Blood pressure 116/77, pulse 73, temperature 97.7 F (36.5 C), temperature source Oral, resp. rate 18, height 5\' 11"  (1.803 m), weight 108.9 kg (240 lb 1.6 oz), SpO2 95 %.  PHYSICAL EXAMINATION:  VITAL SIGNS: Vitals:   10/19/16 0842 10/19/16 1234  BP: 121/79 116/77  Pulse: 72 73   Resp: 18   Temp: 97.4 F (36.3 C) 97.7 F (36.5 C)   GENERAL:45 y.o.male currently in no acute distress.  HEAD: Normocephalic, atraumatic.  EYES: Pupils equal, round, reactive to light. Extraocular muscles intact. No scleral icterus.  MOUTH: Moist mucosal membrane. Dentition intact. No abscess noted.  EAR, NOSE, THROAT: Clear without exudates. No external lesions.  NECK: Supple. No thyromegaly. No nodules. No JVD.  PULMONARY: Clear to ascultation, without wheeze rails or rhonci. No use of accessory muscles, Good respiratory effort. good air entry bilaterally CHEST: Nontender to palpation.  CARDIOVASCULAR: S1 and S2. Regular rate and rhythm. No murmurs, rubs, or gallops. No edema. Pedal pulses 2+ bilaterally.  GASTROINTESTINAL: Soft, nontender, nondistended. No masses. Positive bowel sounds. No hepatosplenomegaly.  MUSCULOSKELETAL: No swelling, clubbing, or edema. Range of motion full in all extremities.  NEUROLOGIC: Cranial nerves II through XII are intact. No gross focal neurological deficits. Sensation intact. Reflexes intact.  SKIN: No ulceration, lesions, rashes, or cyanosis. Skin warm and dry. Turgor intact.  PSYCHIATRIC: Mood, affect within normal limits. The patient is awake, alert and oriented x 3. Insight, judgment intact.      LABORATORY PANEL:   CBC  Recent Labs Lab 10/19/16 0437  WBC 9.4  HGB 17.2  HCT 49.6  PLT 269   ------------------------------------------------------------------------------------------------------------------  Chemistries   Recent Labs Lab 10/19/16 0437  NA 134*  K 3.5  CL 105  CO2 23  GLUCOSE 108*  BUN 12  CREATININE 1.07  CALCIUM 7.9*   ------------------------------------------------------------------------------------------------------------------  Cardiac Enzymes No results for input(s): TROPONINI in the  last 168  hours. ------------------------------------------------------------------------------------------------------------------  RADIOLOGY:  Ct Angio Chest Pe W Or Wo Contrast  Result Date: 10/18/2016 CLINICAL DATA:  Shortness of Breath, extensive DVT right lower extremity EXAM: CT ANGIOGRAPHY CHEST WITH CONTRAST TECHNIQUE: Multidetector CT imaging of the chest was performed using the standard protocol during bolus administration of intravenous contrast. Multiplanar CT image reconstructions and MIPs were obtained to evaluate the vascular anatomy. CONTRAST:  75 cc Isovue COMPARISON:  None. FINDINGS: Cardiovascular: Heart size is within normal limits. No pericardial effusion. No aortic aneurysm or aortic dissection. The study is of excellent technical quality. There is pulmonary embolus noted in left lower lobe branch. Thrombus is extending in at least 2 segmental branches in left lower lobe please see axial image 47. There is thrombus also noted in lingular branch axial image 44. Thrombus is noted in 1 of 2 segmental branches in right lower lobe. Findings are consistent with bilateral pulmonary emboli. The right ventricle over left ventricle ratio is 0.99 borderline for right heart strain. Small linear thrombus is noted in right lower lobe arterial branch axial image 142. Mediastinum/Nodes: No mediastinal hematoma or adenopathy. Central airways are patent. No hilar adenopathy. Lungs/Pleura: Images of the lung parenchyma shows no pulmonary edema. Small infiltrate or peripheral infarcted lung noted superior segment of the left lower lobe axial image 38. Mild atelectasis or infiltrate noted left base posteriorly. Upper Abdomen: Visualized upper abdomen shows no adrenal gland mass. Visualized pancreas spleen and liver is unremarkable. Musculoskeletal: No destructive bony lesions are noted. Sagittal images of the spine shows mild degenerative changes mid and lower thoracic spine. Review of the MIP images confirms the  above findings. IMPRESSION: 1. Bilateral pulmonary emboli as described above. The right ventricle over left ventricle ratio is 0.99 borderline for right heart strain. 2. Small left pleural effusion left base posterior atelectasis or infiltrate. There is small infiltrate or peripheral infarcted lung in superior segment of the left lower lobe posteriorly. 3. No mediastinal hematoma or adenopathy. 4. No pulmonary edema. 5. Mild degenerative changes mid and lower thoracic spine. Critical Value/emergent results were called by telephone at the time of interpretation on 10/18/2016 at 4:47 pm to Dr. Hillary Bow , who verbally acknowledged these results. Electronically Signed   By: Lahoma Crocker M.D.   On: 10/18/2016 16:49   US Venous Img Lower Unilateral Right  Result Date: 10/18/2016 CLINICAL DATA:  Right calf pain and edema for the past 3 weeks. History of smoking. Evaluate for DVT. EXAM: RIGHT LOWER EXTREMITY VENOUS DOPPLER ULTRASOUND TECHNIQUE: Gray-scale sonography with graded compression, as well as color Doppler and duplex ultrasound were performed to evaluate the lower extremity deep venous systems from the level of the common femoral vein and including the common femoral, femoral, profunda femoral, popliteal and calf veins including the posterior tibial, peroneal and gastrocnemius veins when visible. The superficial great saphenous vein was also interrogated. Spectral Doppler was utilized to evaluate flow at rest and with distal augmentation maneuvers in the common femoral, femoral and popliteal veins. COMPARISON:  None. FINDINGS: Contralateral Common Femoral Vein: Respiratory phasicity is normal and symmetric with the symptomatic side. No evidence of thrombus. Normal compressibility. There is mixed echogenic near occlusive thrombus within the right common femoral vein (images 9 and 10). The saphenofemoral junction appears patent. There is mixed echogenic occlusive thrombus within the imaged portions of the  deep femoral vein (image 17) extending from the proximal superficial femoral vein (image 19) through its distal most aspect (image 28), and extending through the  right popliteal vein (images 20 and 32) into the imaged portions of the right posterior tibial and peroneal veins. Other Findings:  None. IMPRESSION: Examination is positive for extensive, largely occlusive mixed echogenic thrombus extending from the right common femoral vein through all the imaged venous segments of the right lower extremity. Critical Value/emergent results were called by telephone at the time of interpretation on 10/18/2016 at 2:38 pm to Dr. Corky Downs, who verbally acknowledged these results. Electronically Signed   By: Sandi Mariscal M.D.   On: 10/18/2016 14:39    EKG:  No orders found for this or any previous visit.  ASSESSMENT AND PLAN:   Lance Cohen is a 45 y.o. male presenting with Leg Pain . Admitted 10/18/2016 : Day #: 1 day 1. Acute DVT/PE: On heparin, and case discussed with vascular surgery planned for thrombolysis 10/22/16: Okay to discharge on therapeutic Lovenox 2. Tobacco abuse: Time spent counseling 3 minutes. Nicotine patch  Disposition: Case management consult for Lovenox if we can get this approved to be discharged however I doubt it can be given his insurance   All the records are reviewed and case discussed with Care Management/Social Workerr. Management plans discussed with the patient, family and they are in agreement.  CODE STATUS: full TOTAL TIME TAKING CARE OF THIS PATIENT: 28 minutes.   POSSIBLE D/C IN 1DAYS, DEPENDING ON CLINICAL CONDITION.   Belva Koziel,  Karenann Cai.D on 10/19/2016 at 3:05 PM  Between 7am to 6pm - Pager - [phone]  After 6pm: House Pager: - [phone]  [facility]  Office  [phone]  CC: Primary care physician; No PCP Per Patient

## 2016-10-19 NOTE — Consult Note (Signed)
Consult Note  Patient name: GEVORG UTRERA MRN: [id number] DOB: 18-Jul-1971 Sex: male  Consulting Physician:  Dr. Darvin Neighbours  Reason for Consult:  Chief Complaint  Patient presents with  . Leg Pain    HISTORY OF PRESENT ILLNESS: This is a 45 y.o. male with a known history of Hypertension, bipolar disorder, tobacco use presents to the emergency room complaining of 2 weeks of right lower extremity pain extending from thigh to his toes. He has also noticed swelling of his right lower extremity.  He had an episode of chest and back pain about 2 weeks ago.  He denies current shortness of breath.  U/S in the ER showed extensive DVT in the right leg.  CTA was positive for PE  The patient denies a family history of clotting disorders.  He has never had a DVT.  He denies recent travel or long periods of immobility.    Past Medical History:  Diagnosis Date  . Asthma   . Bipolar 1 disorder ([place])   . Chronic knee pain   . Hypertension   . Suicide attempt    2010 Intentional overdose attempt with Depakote.    Past Surgical History:  Procedure Laterality Date  . FOOT SURGERY     had peice of wire removed    Social History   Social History  . Marital status: Married    Spouse name: N/A  . Number of children: N/A  . Years of education: N/A   Occupational History  . Not on file.   Social History Main Topics  . Smoking status: Current Every Day Smoker    Packs/day: 0.50  . Smokeless tobacco: Never Used  . Alcohol use Yes     Comment: occasionally  . Drug use: No  . Sexual activity: Not on file   Other Topics Concern  . Not on file   Social History Narrative  . No narrative on file    Family History  Problem Relation Age of Onset  . Cancer Mother   . Heart attack Father     Allergies as of 10/18/2016 - Review Complete 10/18/2016  Allergen Reaction Noted  . Bee venom Swelling 12/08/2013  . Penicillins Other (See Comments)   . Sulfa antibiotics Itching 11/13/2013     No current facility-administered medications on file prior to encounter.    Current Outpatient Prescriptions on File Prior to Encounter  Medication Sig Dispense Refill  . clindamycin (CLEOCIN) 150 MG capsule Take 2 capsules (300 mg total) by mouth 4 (four) times daily. 80 capsule 0  . ibuprofen (ADVIL,MOTRIN) 800 MG tablet Take 1 tablet (800 mg total) by mouth 3 (three) times daily. 21 tablet 0     REVIEW OF SYSTEMS: Cardiovascular: No chest pain, chest pressure, palpitations, orthopnea, or dyspnea on exertion. No claudication or rest pain,  No history of DVT or phlebitis. Pulmonary: No productive cough, asthma or wheezing. Neurologic: No weakness, paresthesias, aphasia, or amaurosis. No dizziness. Hematologic: No bleeding problems or clotting disorders. Musculoskeletal: No joint pain or joint swelling. Gastrointestinal: No blood in stool or hematemesis Genitourinary: No dysuria or hematuria. Psychiatric:: No history of major depression. Integumentary: No rashes or ulcers. Constitutional: No fever or chills.  PHYSICAL EXAMINATION: General: The patient appears their stated age.  Vital signs are BP 116/77 (BP Location: Right Arm)   Pulse 73   Temp 97.7 F (36.5 C) (Oral)   Resp 18   Ht 5\' 11"  (1.803 m)  Wt 240 lb 1.6 oz (108.9 kg)   SpO2 95%   BMI 33.49 kg/m  Pulmonary: Respirations are non-labored HEENT:  No gross abnormalities Abdomen: Soft and non-tender  Musculoskeletal: There are no major deformities.   Neurologic: No focal weakness or paresthesias are detected, Skin: There are no ulcer or rashes noted. Psychiatric: The patient has normal affect. Cardiovascular: There is a regular rate and rhythm.  Right leg with significant edema.  Palpable pedal pules  Diagnostic Studies: I have reviewed his u/s study with the following findings: Examination is positive for extensive, largely occlusive mixed echogenic thrombus extending from the right common femoral  vein through all the imaged venous segments of the right lower extremity.  CTA: 1. Bilateral pulmonary emboli as described above. The right ventricle over left ventricle ratio is 0.99 borderline for right heart strain. 2. Small left pleural effusion left base posterior atelectasis or infiltrate. There is small infiltrate or peripheral infarcted lung in superior segment of the left lower lobe posteriorly. 3. No mediastinal hematoma or adenopathy. 4. No pulmonary edema. 5. Mild degenerative changes mid and lower thoracic spine   Assessment:  Right leg DVT and PE Plan: I discussed with the patient our treatment options including Anti-coagulation vs thrombolysis.  We have decided to proceed with thrombolysis.  This can not be done until Tuesday.  The patient can remain an inpatient on IV heparin or be discharged on Lovenox.  He is not sure he can administer the shots, and probably can not afford them, unless it is covered by Medicaid.  He wants to go out and smoke.  I recommended a nicotene patch     V. Leia Alf, M.D. [facility] Office: [phone] Pager:  [phone]

## 2016-10-20 LAB — CBC
HEMATOCRIT: 47.6 % (ref 40.0–52.0)
Hemoglobin: 16.7 g/dL (ref 13.0–18.0)
MCH: 36.3 pg — AB (ref 26.0–34.0)
MCHC: 35 g/dL (ref 32.0–36.0)
MCV: 103.7 fL — ABNORMAL HIGH (ref 80.0–100.0)
Platelets: 268 10*3/uL (ref 150–440)
RBC: 4.59 MIL/uL (ref 4.40–5.90)
RDW: 14.9 % — AB (ref 11.5–14.5)
WBC: 7.6 10*3/uL (ref 3.8–10.6)

## 2016-10-20 LAB — CARDIOLIPIN ANTIBODIES, IGG, IGM, IGA: Anticardiolipin IgM: 33 MPL U/mL — ABNORMAL HIGH (ref 0–12)

## 2016-10-20 LAB — HEPARIN LEVEL (UNFRACTIONATED): HEPARIN UNFRACTIONATED: 0.35 [IU]/mL (ref 0.30–0.70)

## 2016-10-20 MED ORDER — ZOLPIDEM TARTRATE 5 MG PO TABS
5.0000 mg | ORAL_TABLET | Freq: Every evening | ORAL | Status: DC | PRN
  Administered 2016-10-20 – 2016-10-22 (×3): 5 mg via ORAL
  Filled 2016-10-20 (×3): qty 1

## 2016-10-20 MED ORDER — NICOTINE 14 MG/24HR TD PT24
14.0000 mg | MEDICATED_PATCH | Freq: Every day | TRANSDERMAL | Status: DC
  Administered 2016-10-21 – 2016-10-23 (×2): 14 mg via TRANSDERMAL
  Filled 2016-10-20 (×3): qty 1

## 2016-10-20 NOTE — Progress Notes (Signed)
ANTICOAGULATION CONSULT NOTE - Initial Consult  Pharmacy Consult for Heparin drip Indication: DVT  Allergies  Allergen Reactions  . Bee Venom Swelling  . Penicillins Other (See Comments)    PT states that he had symptoms that felt like he was having a heart attack Has patient had a PCN reaction causing immediate rash, facial/tongue/throat swelling, SOB or lightheadedness with hypotension: No Has patient had a PCN reaction causing severe rash involving mucus membranes or skin necrosis: No Has patient had a PCN reaction that required hospitalization No Has patient had a PCN reaction occurring within the last 10 years: No If all of the above answers are "NO", then may proceed with Cephal  . Sulfa Antibiotics Itching    Patient Measurements: Height: 5\' 11"  (180.3 cm) Weight: 240 lb 1.6 oz (108.9 kg) IBW/kg (Calculated) : 75.3 Heparin Dosing Weight: 98.5 kg  Vital Signs: Temp: 97.5 F (36.4 C) (12/31 0454) Temp Source: Oral (12/31 0454) BP: 129/71 (12/31 0454) Pulse Rate: 76 (12/31 0454)  Labs:  Recent Labs  10/18/16 1444 10/18/16 2141 10/19/16 0437 10/20/16 0628  HGB 19.1*  --  17.2 16.7  HCT 56.0*  --  49.6 47.6  PLT 292  --  269 268  APTT 32  --   --   --   LABPROT 13.8  --   --   --   INR 1.06  --   --   --   HEPARINUNFRC  --  0.36 0.44 0.35  CREATININE 1.12  --  1.07  --     Estimated Creatinine Clearance: 110.5 mL/min (by C-G formula based on SCr of 1.07 mg/dL).   Medical History: Past Medical History:  Diagnosis Date  . Asthma   . Bipolar 1 disorder ([place])   . Chronic knee pain   . Hypertension   . Suicide attempt    2010 Intentional overdose attempt with Depakote.    Assessment: 45 y/o M with a h/o asthma and bipolar disorder with calf pain x 3 weeks to begin IV heparin infusion for DVT. Per patient, no anticoagulants PTA.   Goal of Therapy:  Heparin level 0.3-0.7 units/ml Monitor platelets by anticoagulation protocol: Yes   Plan:  Give 5000  units bolus x 1 Start heparin infusion at 1700 units/hr Check anti-Xa level in 6 hours and daily while on heparin Continue to monitor H&H and platelets   12/29:  HL @ 22:00 = 0.36 Will continue this pt on current rate of 1700 units/hr and recheck HL on 12/30 @ 0400.   12/30 AM heparin level 0.44. Continue current regimen. Recheck heparin level and CBC tomorrow AM.  12/31 AM heparin level 0.35. Continue current regimen.  CBC and heparin level with tomorrow AM labs.  Shey Yott,Yaw S 10/20/2016,7:10 AM

## 2016-10-20 NOTE — Progress Notes (Signed)
[facility] [place] NAME: Lance Cohen    MRN#:  [id number]  DATE OF BIRTH:  [birth year]  SUBJECTIVE:  Hospital Day: 2 days Lance Cohen is a 45 y.o. male presenting with Leg Pain .   Overnight events: No overnight events Interval Events: States he had bad nightmares but leg pain improved  REVIEW OF SYSTEMS:  CONSTITUTIONAL: No fever, fatigue or weakness.  EYES: No blurred or double vision.  EARS, NOSE, AND THROAT: No tinnitus or ear pain.  RESPIRATORY: No cough, shortness of breath, wheezing or hemoptysis.  CARDIOVASCULAR: No chest pain, orthopnea, edema.  GASTROINTESTINAL: No nausea, vomiting, diarrhea or abdominal pain.  GENITOURINARY: No dysuria, hematuria.  ENDOCRINE: No polyuria, nocturia,  HEMATOLOGY: No anemia, easy bruising or bleeding SKIN: No rash or lesion. MUSCULOSKELETAL: No joint pain or arthritis.   NEUROLOGIC: No tingling, numbness, weakness.  PSYCHIATRY: No anxiety or depression.   DRUG ALLERGIES:   Allergies  Allergen Reactions  . Bee Venom Swelling  . Penicillins Other (See Comments)    PT states that he had symptoms that felt like he was having a heart attack Has patient had a PCN reaction causing immediate rash, facial/tongue/throat swelling, SOB or lightheadedness with hypotension: No Has patient had a PCN reaction causing severe rash involving mucus membranes or skin necrosis: No Has patient had a PCN reaction that required hospitalization No Has patient had a PCN reaction occurring within the last 10 years: No If all of the above answers are "NO", then may proceed with Cephal  . Sulfa Antibiotics Itching    VITALS:  Blood pressure 112/71, pulse (!) 59, temperature 97.7 F (36.5 C), temperature source Oral, resp. rate 18, height 5\' 11"  (1.803 m), weight 108.9 kg (240 lb 1.6 oz), SpO2 96 %.  PHYSICAL EXAMINATION:  VITAL SIGNS: Vitals:   10/20/16 0454 10/20/16 0740  BP: 129/71 112/71   Pulse: 76 (!) 59  Resp: 16 18  Temp: 97.5 F (36.4 C) 97.7 F (36.5 C)   GENERAL:45 y.o.male currently in no acute distress.  HEAD: Normocephalic, atraumatic.  EYES: Pupils equal, round, reactive to light. Extraocular muscles intact. No scleral icterus.  MOUTH: Moist mucosal membrane. Dentition intact. No abscess noted.  EAR, NOSE, THROAT: Clear without exudates. No external lesions.  NECK: Supple. No thyromegaly. No nodules. No JVD.  PULMONARY: Clear to ascultation, without wheeze rails or rhonci. No use of accessory muscles, Good respiratory effort. good air entry bilaterally CHEST: Nontender to palpation.  CARDIOVASCULAR: S1 and S2. Regular rate and rhythm. No murmurs, rubs, or gallops. No edema. Pedal pulses 2+ bilaterally.  GASTROINTESTINAL: Soft, nontender, nondistended. No masses. Positive bowel sounds. No hepatosplenomegaly.  MUSCULOSKELETAL: No swelling, clubbing, or edema. Range of motion full in all extremities.  NEUROLOGIC: Cranial nerves II through XII are intact. No gross focal neurological deficits. Sensation intact. Reflexes intact.  SKIN: No ulceration, lesions, rashes, or cyanosis. Skin warm and dry. Turgor intact.  PSYCHIATRIC: Mood, affect within normal limits. The patient is awake, alert and oriented x 3. Insight, judgment intact.      LABORATORY PANEL:   CBC  Recent Labs Lab 10/20/16 0628  WBC 7.6  HGB 16.7  HCT 47.6  PLT 268   ------------------------------------------------------------------------------------------------------------------  Chemistries   Recent Labs Lab 10/19/16 0437  NA 134*  K 3.5  CL 105  CO2 23  GLUCOSE 108*  BUN 12  CREATININE 1.07  CALCIUM 7.9*   ------------------------------------------------------------------------------------------------------------------  Cardiac Enzymes No results for input(s):  TROPONINI in the last 168  hours. ------------------------------------------------------------------------------------------------------------------  RADIOLOGY:  Ct Angio Chest Pe W Or Wo Contrast  Result Date: 10/18/2016 CLINICAL DATA:  Shortness of Breath, extensive DVT right lower extremity EXAM: CT ANGIOGRAPHY CHEST WITH CONTRAST TECHNIQUE: Multidetector CT imaging of the chest was performed using the standard protocol during bolus administration of intravenous contrast. Multiplanar CT image reconstructions and MIPs were obtained to evaluate the vascular anatomy. CONTRAST:  75 cc Isovue COMPARISON:  None. FINDINGS: Cardiovascular: Heart size is within normal limits. No pericardial effusion. No aortic aneurysm or aortic dissection. The study is of excellent technical quality. There is pulmonary embolus noted in left lower lobe branch. Thrombus is extending in at least 2 segmental branches in left lower lobe please see axial image 47. There is thrombus also noted in lingular branch axial image 44. Thrombus is noted in 1 of 2 segmental branches in right lower lobe. Findings are consistent with bilateral pulmonary emboli. The right ventricle over left ventricle ratio is 0.99 borderline for right heart strain. Small linear thrombus is noted in right lower lobe arterial branch axial image 142. Mediastinum/Nodes: No mediastinal hematoma or adenopathy. Central airways are patent. No hilar adenopathy. Lungs/Pleura: Images of the lung parenchyma shows no pulmonary edema. Small infiltrate or peripheral infarcted lung noted superior segment of the left lower lobe axial image 38. Mild atelectasis or infiltrate noted left base posteriorly. Upper Abdomen: Visualized upper abdomen shows no adrenal gland mass. Visualized pancreas spleen and liver is unremarkable. Musculoskeletal: No destructive bony lesions are noted. Sagittal images of the spine shows mild degenerative changes mid and lower thoracic spine. Review of the MIP images confirms the  above findings. IMPRESSION: 1. Bilateral pulmonary emboli as described above. The right ventricle over left ventricle ratio is 0.99 borderline for right heart strain. 2. Small left pleural effusion left base posterior atelectasis or infiltrate. There is small infiltrate or peripheral infarcted lung in superior segment of the left lower lobe posteriorly. 3. No mediastinal hematoma or adenopathy. 4. No pulmonary edema. 5. Mild degenerative changes mid and lower thoracic spine. Critical Value/emergent results were called by telephone at the time of interpretation on 10/18/2016 at 4:47 pm to Dr. Hillary Bow , who verbally acknowledged these results. Electronically Signed   By: Lahoma Crocker M.D.   On: 10/18/2016 16:49   US Venous Img Lower Unilateral Right  Result Date: 10/18/2016 CLINICAL DATA:  Right calf pain and edema for the past 3 weeks. History of smoking. Evaluate for DVT. EXAM: RIGHT LOWER EXTREMITY VENOUS DOPPLER ULTRASOUND TECHNIQUE: Gray-scale sonography with graded compression, as well as color Doppler and duplex ultrasound were performed to evaluate the lower extremity deep venous systems from the level of the common femoral vein and including the common femoral, femoral, profunda femoral, popliteal and calf veins including the posterior tibial, peroneal and gastrocnemius veins when visible. The superficial great saphenous vein was also interrogated. Spectral Doppler was utilized to evaluate flow at rest and with distal augmentation maneuvers in the common femoral, femoral and popliteal veins. COMPARISON:  None. FINDINGS: Contralateral Common Femoral Vein: Respiratory phasicity is normal and symmetric with the symptomatic side. No evidence of thrombus. Normal compressibility. There is mixed echogenic near occlusive thrombus within the right common femoral vein (images 9 and 10). The saphenofemoral junction appears patent. There is mixed echogenic occlusive thrombus within the imaged portions of the  deep femoral vein (image 17) extending from the proximal superficial femoral vein (image 19) through its distal most aspect (image 28), and  extending through the right popliteal vein (images 20 and 32) into the imaged portions of the right posterior tibial and peroneal veins. Other Findings:  None. IMPRESSION: Examination is positive for extensive, largely occlusive mixed echogenic thrombus extending from the right common femoral vein through all the imaged venous segments of the right lower extremity. Critical Value/emergent results were called by telephone at the time of interpretation on 10/18/2016 at 2:38 pm to Dr. Corky Downs, who verbally acknowledged these results. Electronically Signed   By: Sandi Mariscal M.D.   On: 10/18/2016 14:39    EKG:  No orders found for this or any previous visit.  ASSESSMENT AND PLAN:   Lance Cohen is a 45 y.o. male presenting with Leg Pain . Admitted 10/18/2016 : Day #: 2 days 1. Acute DVT/PE: On heparin, and case discussed with vascular surgery planned for thrombolysis 10/22/16:  2. Tobacco abuse: Time spent counseling 3 minutes. Decrease dose of Nicotine patch  Disposition: Case management consult for Lovenox if we can get this approved to be discharged however I doubt it can be   All the records are reviewed and case discussed with Care Management/Social Workerr. Management plans discussed with the patient, family and they are in agreement.  CODE STATUS: full TOTAL TIME TAKING CARE OF THIS PATIENT: 28 minutes.   POSSIBLE D/C IN 2-3DAYS, DEPENDING ON CLINICAL CONDITION.   Isidora Laham,  Karenann Cai.D on 10/20/2016 at 11:37 AM  Between 7am to 6pm - Pager - [phone]  After 6pm: House Pager: - [phone]  [facility]  Office  [phone]  CC: Primary care physician; No PCP Per Patient

## 2016-10-21 LAB — HEPARIN LEVEL (UNFRACTIONATED): Heparin Unfractionated: 0.56 IU/mL (ref 0.30–0.70)

## 2016-10-21 LAB — CBC
HCT: 49.5 % (ref 40.0–52.0)
Hemoglobin: 17.4 g/dL (ref 13.0–18.0)
MCH: 36.9 pg — AB (ref 26.0–34.0)
MCHC: 35.2 g/dL (ref 32.0–36.0)
MCV: 104.8 fL — AB (ref 80.0–100.0)
PLATELETS: 292 10*3/uL (ref 150–440)
RBC: 4.72 MIL/uL (ref 4.40–5.90)
RDW: 14.9 % — ABNORMAL HIGH (ref 11.5–14.5)
WBC: 7.2 10*3/uL (ref 3.8–10.6)

## 2016-10-21 MED ORDER — ALUM & MAG HYDROXIDE-SIMETH 200-200-20 MG/5ML PO SUSP
30.0000 mL | Freq: Four times a day (QID) | ORAL | Status: DC | PRN
  Administered 2016-10-21: 30 mL via ORAL
  Filled 2016-10-21: qty 30

## 2016-10-21 NOTE — Progress Notes (Signed)
[facility] [place] NAME: Lance Cohen    MR#:  [id number]  DATE OF BIRTH:  04/20/1971  SUBJECTIVE:   Patient here due to right lower extremity swelling and pain and noted to have an extensive right lower extremity DVT. Plan for thrombolysis tomorrow. Continues to have significant dependent lower extremities. Remains on a heparin drip. No hemoptysis, shortness of breath.  REVIEW OF SYSTEMS:    Review of Systems  Constitutional: Negative for chills and fever.  HENT: Negative for congestion and tinnitus.   Eyes: Negative for blurred vision and double vision.  Respiratory: Negative for cough, shortness of breath and wheezing.   Cardiovascular: Positive for leg swelling. Negative for chest pain, orthopnea and PND.  Gastrointestinal: Negative for abdominal pain, diarrhea, nausea and vomiting.  Genitourinary: Negative for dysuria and hematuria.  Neurological: Negative for dizziness, sensory change and focal weakness.  All other systems reviewed and are negative.   Nutrition: Regular Tolerating Diet: Yes Tolerating PT: Await Eval.   DRUG ALLERGIES:   Allergies  Allergen Reactions  . Bee Venom Swelling  . Penicillins Other (See Comments)    PT states that he had symptoms that felt like he was having a heart attack Has patient had a PCN reaction causing immediate rash, facial/tongue/throat swelling, SOB or lightheadedness with hypotension: No Has patient had a PCN reaction causing severe rash involving mucus membranes or skin necrosis: No Has patient had a PCN reaction that required hospitalization No Has patient had a PCN reaction occurring within the last 10 years: No If all of the above answers are "NO", then may proceed with Cephal  . Sulfa Antibiotics Itching    VITALS:  Blood pressure 122/78, pulse 61, temperature 97.4 F (36.3 C), temperature source Oral, resp. rate 20, height 5\' 11"  (1.803 m), weight 110 kg (242 lb 8 oz), SpO2  100 %.  PHYSICAL EXAMINATION:   Physical Exam  GENERAL:  46 y.o.-year-old patient lying in the bed in no acute distress.  EYES: Pupils equal, round, reactive to light and accommodation. No scleral icterus. Extraocular muscles intact.  HEENT: Head atraumatic, normocephalic. Oropharynx and nasopharynx clear.  NECK:  Supple, no jugular venous distention. No thyroid enlargement, no tenderness.  LUNGS: Normal breath sounds bilaterally, no wheezing, rales, rhonchi. No use of accessory muscles of respiration.  CARDIOVASCULAR: S1, S2 normal. No murmurs, rubs, or gallops.  ABDOMEN: Soft, nontender, nondistended. Bowel sounds present. No organomegaly or mass.  EXTREMITIES: No cyanosis, clubbing or edema b/l.    NEUROLOGIC: Cranial nerves II through XII are intact. No focal Motor or sensory deficits b/l.  PSYCHIATRIC: The patient is alert and oriented x 3.  SKIN: No obvious rash, lesion, or ulcer.    LABORATORY PANEL:   CBC  Recent Labs Lab 10/21/16 0609  WBC 7.2  HGB 17.4  HCT 49.5  PLT 292   ------------------------------------------------------------------------------------------------------------------  Chemistries   Recent Labs Lab 10/19/16 0437  NA 134*  K 3.5  CL 105  CO2 23  GLUCOSE 108*  BUN 12  CREATININE 1.07  CALCIUM 7.9*   ------------------------------------------------------------------------------------------------------------------  Cardiac Enzymes No results for input(s): TROPONINI in the last 168 hours. ------------------------------------------------------------------------------------------------------------------  RADIOLOGY:  No results found.   ASSESSMENT AND PLAN:   46 year old male with past medical history of hypertension who presented to the hospital with right lower extremity hand swelling and noted to have an extensive DVT.  1. Right lower extremity extensive DVT/pulmonary embolism-unlikely unprovoked thromboembolism as he has no risk  factors. -Thrombophilia workup so far has been negative. -Continue heparin nomogram. Plan for Thrombolysis as per Vascular tomorrow.   2. Essential HTN - cont. Norvasc.   3. Tobacco abuse - cont. Nicotine patch.     All the records are reviewed and case discussed with Care Management/Social Worker. Management plans discussed with the patient, family and they are in agreement.  CODE STATUS: Full Code  DVT Prophylaxis: Heparin gtt  TOTAL TIME TAKING CARE OF THIS PATIENT: 30 minutes.   POSSIBLE D/C IN 2-3 DAYS, DEPENDING ON CLINICAL CONDITION.   Henreitta Leber M.D on 10/21/2016 at 3:25 PM  Between 7am to 6pm - Pager - [phone]  After 6pm go to [email or web address] - [facility]  Office  [phone]  CC: Primary care physician; No PCP Per Patient

## 2016-10-21 NOTE — Progress Notes (Signed)
For thrombolysis tomorrow NPO after midnight   Lance Cohen

## 2016-10-22 ENCOUNTER — Encounter
Admission: EM | Disposition: A | Discharge status: Home / Self Care | Payer: Self-pay | Source: Home / Self Care | Attending: Internal Medicine

## 2016-10-22 DIAGNOSIS — I82401 Acute embolism and thrombosis of unspecified deep veins of right lower extremity: Secondary | ICD-10-CM

## 2016-10-22 DIAGNOSIS — I2699 Other pulmonary embolism without acute cor pulmonale: Secondary | ICD-10-CM

## 2016-10-22 HISTORY — PX: PERIPHERAL VASCULAR CATHETERIZATION: SHX172C

## 2016-10-22 LAB — BASIC METABOLIC PANEL
ANION GAP: 10 (ref 5–15)
BUN: 7 mg/dL (ref 6–20)
CHLORIDE: 101 mmol/L (ref 101–111)
CO2: 24 mmol/L (ref 22–32)
CREATININE: 1.11 mg/dL (ref 0.61–1.24)
Calcium: 9.1 mg/dL (ref 8.9–10.3)
GFR calc non Af Amer: >60 mL/min (ref >60)
Glucose, Bld: 100 mg/dL — ABNORMAL HIGH (ref 65–99)
POTASSIUM: 4 mmol/L (ref 3.5–5.1)
SODIUM: 135 mmol/L (ref 135–145)

## 2016-10-22 LAB — PROTIME-INR
INR: 1.11
Prothrombin Time: 14.4 seconds (ref 11.4–15.2)

## 2016-10-22 LAB — CBC
HCT: 48.8 % (ref 40.0–52.0)
HEMATOCRIT: 54.4 % — AB (ref 40.0–52.0)
HEMOGLOBIN: 17.2 g/dL (ref 13.0–18.0)
HEMOGLOBIN: 19 g/dL — AB (ref 13.0–18.0)
MCH: 36.8 pg — AB (ref 26.0–34.0)
MCH: 36.3 pg — ABNORMAL HIGH (ref 26.0–34.0)
MCHC: 35.2 g/dL (ref 32.0–36.0)
MCHC: 34.8 g/dL (ref 32.0–36.0)
MCV: 104.5 fL — AB (ref 80.0–100.0)
MCV: 104.1 fL — AB (ref 80.0–100.0)
PLATELETS: 284 10*3/uL (ref 150–440)
Platelets: 296 10*3/uL (ref 150–440)
RBC: 4.67 MIL/uL (ref 4.40–5.90)
RBC: 5.23 MIL/uL (ref 4.40–5.90)
RDW: 14.5 % (ref 11.5–14.5)
RDW: 14.9 % — ABNORMAL HIGH (ref 11.5–14.5)
WBC: 7.1 10*3/uL (ref 3.8–10.6)
WBC: 8.3 10*3/uL (ref 3.8–10.6)

## 2016-10-22 LAB — HEPARIN LEVEL (UNFRACTIONATED)
Heparin Unfractionated: 0.37 IU/mL (ref 0.30–0.70)
Heparin Unfractionated: 0.46 IU/mL (ref 0.30–0.70)
Heparin Unfractionated: 0.56 IU/mL (ref 0.30–0.70)

## 2016-10-22 LAB — HOMOCYSTEINE: Homocysteine: >250 umol/L — ABNORMAL HIGH (ref 0.0–15.0)

## 2016-10-22 LAB — BETA-2-GLYCOPROTEIN I ABS, IGG/M/A
Beta-2 Glyco I IgG: <9 GPI IgG units (ref 0–20)
Beta-2-Glycoprotein I IgA: <9 GPI IgA units (ref 0–25)

## 2016-10-22 LAB — PROTEIN C, TOTAL: Protein C, Total: 63 % (ref 60–150)

## 2016-10-22 SURGERY — THROMBECTOMY
Anesthesia: Moderate Sedation | Laterality: Right

## 2016-10-22 MED ORDER — HEPARIN SODIUM (PORCINE) 1000 UNIT/ML IJ SOLN
INTRAMUSCULAR | Status: DC | PRN
  Administered 2016-10-22: 4000 [IU] via INTRAVENOUS

## 2016-10-22 MED ORDER — MIDAZOLAM HCL 2 MG/2ML IJ SOLN
INTRAMUSCULAR | Status: DC | PRN
  Administered 2016-10-22 (×2): 1 mg via INTRAVENOUS
  Administered 2016-10-22: 2 mg via INTRAVENOUS
  Administered 2016-10-22 (×2): 1 mg via INTRAVENOUS

## 2016-10-22 MED ORDER — DIPHENHYDRAMINE HCL 50 MG/ML IJ SOLN
50.0000 mg | Freq: Once | INTRAMUSCULAR | Status: AC
  Administered 2016-10-22: 50 mg via INTRAVENOUS

## 2016-10-22 MED ORDER — DIPHENHYDRAMINE HCL 50 MG/ML IJ SOLN
INTRAMUSCULAR | Status: AC
  Filled 2016-10-22: qty 1

## 2016-10-22 MED ORDER — CLINDAMYCIN PHOSPHATE 300 MG/50ML IV SOLN
INTRAVENOUS | Status: AC
  Administered 2016-10-22: 300 mg via INTRAVENOUS
  Filled 2016-10-22: qty 50

## 2016-10-22 MED ORDER — FENTANYL CITRATE (PF) 100 MCG/2ML IJ SOLN
INTRAMUSCULAR | Status: AC
  Filled 2016-10-22: qty 2

## 2016-10-22 MED ORDER — MIDAZOLAM HCL 2 MG/2ML IJ SOLN
INTRAMUSCULAR | Status: AC
  Filled 2016-10-22: qty 2

## 2016-10-22 MED ORDER — ALTEPLASE 2 MG IJ SOLR
INTRAMUSCULAR | Status: DC | PRN
Start: 2016-10-22 — End: 2016-10-22
  Administered 2016-10-22: 14 mg

## 2016-10-22 MED ORDER — ALTEPLASE 2 MG IJ SOLR
INTRAMUSCULAR | Status: AC
  Filled 2016-10-22: qty 14

## 2016-10-22 MED ORDER — HEPARIN SODIUM (PORCINE) 1000 UNIT/ML IJ SOLN
INTRAMUSCULAR | Status: AC
  Filled 2016-10-22: qty 1

## 2016-10-22 MED ORDER — CLINDAMYCIN PHOSPHATE 300 MG/50ML IV SOLN
300.0000 mg | Freq: Once | INTRAVENOUS | Status: AC
  Administered 2016-10-22: 300 mg via INTRAVENOUS

## 2016-10-22 MED ORDER — FENTANYL CITRATE (PF) 100 MCG/2ML IJ SOLN
INTRAMUSCULAR | Status: DC | PRN
  Administered 2016-10-22 (×4): 50 ug via INTRAVENOUS
  Administered 2016-10-22: 100 ug via INTRAVENOUS

## 2016-10-22 MED ORDER — HEPARIN (PORCINE) IN NACL 100-0.45 UNIT/ML-% IJ SOLN
1700.0000 [IU]/h | INTRAMUSCULAR | Status: AC
  Administered 2016-10-22 – 2016-10-23 (×2): 1700 [IU]/h via INTRAVENOUS
  Filled 2016-10-22 (×4): qty 250

## 2016-10-22 SURGICAL SUPPLY — 17 items
BALLN DORADO 7X200X80 (BALLOONS) ×3
BALLOON DORADO 7X200X80 (BALLOONS) ×2 IMPLANT
CANISTER PENUMBRA MAX (MISCELLANEOUS) ×3 IMPLANT
CATH INDIGO 8 XTORQ TIP 115CM (CATHETERS) ×3 IMPLANT
CATH INDIGO SEP 8 (CATHETERS) ×3 IMPLANT
DEVICE PRESTO INFLATION (MISCELLANEOUS) ×3 IMPLANT
DRAPE BRACHIAL (DRAPES) ×3 IMPLANT
DRAPE TABLE BACK 80X90 (DRAPES) ×3 IMPLANT
FILTER VC CELECT-FEMORAL (Filter) ×3 IMPLANT
NEEDLE ENTRY 21GA 7CM ECHOTIP (NEEDLE) ×3 IMPLANT
PACK ANGIOGRAPHY (CUSTOM PROCEDURE TRAY) ×3 IMPLANT
SET INTRO CAPELLA COAXIAL (SET/KITS/TRAYS/PACK) ×3 IMPLANT
SHEATH BRITE TIP 5FRX11 (SHEATH) ×3 IMPLANT
SHEATH BRITE TIP 8FRX11 (SHEATH) ×3 IMPLANT
TOWEL OR 17X26 4PK STRL BLUE (TOWEL DISPOSABLE) ×3 IMPLANT
TUBING ASPIRATION INDIGO (MISCELLANEOUS) ×3 IMPLANT
WIRE MAGIC TOR.035 180C (WIRE) ×3 IMPLANT

## 2016-10-22 NOTE — Progress Notes (Addendum)
ANTICOAGULATION CONSULT NOTE - follow up [place] for Heparin drip Indication: DVT/PE  Allergies  Allergen Reactions  . Bee Venom Swelling  . Penicillins Other (See Comments)    PT states that he had symptoms that felt like he was having a heart attack Has patient had a PCN reaction causing immediate rash, facial/tongue/throat swelling, SOB or lightheadedness with hypotension: No Has patient had a PCN reaction causing severe rash involving mucus membranes or skin necrosis: No Has patient had a PCN reaction that required hospitalization No Has patient had a PCN reaction occurring within the last 10 years: No If all of the above answers are "NO", then may proceed with Cephal  . Sulfa Antibiotics Itching    Patient Measurements: Height: 5\' 11"  (180.3 cm) Weight: 240 lb (108.9 kg) IBW/kg (Calculated) : 75.3 Heparin Dosing Weight: 98.5 kg  Vital Signs: Temp: 98 F (36.7 C) (01/02 1207) Temp Source: Oral (01/02 1207) BP: 135/88 (01/02 1207) Pulse Rate: 56 (01/02 1207)  Labs:  Recent Labs  10/20/16 0628 10/21/16 0609 10/22/16 0440  HGB 16.7 17.4 17.2  HCT 47.6 49.5 48.8  PLT 268 292 296  LABPROT  --   --  14.4  INR  --   --  1.11  HEPARINUNFRC 0.35 0.56 0.37    Estimated Creatinine Clearance: 110.5 mL/min (by C-G formula based on SCr of 1.07 mg/dL).   Medical History: Past Medical History:  Diagnosis Date  . Asthma   . Bipolar 1 disorder ([place])   . Chronic knee pain   . Hypertension   . Suicide attempt    2010 Intentional overdose attempt with Depakote.    Assessment: 46 y/o M with a h/o asthma and bipolar disorder with calf pain x 3 weeks to begin IV heparin infusion for DVT. Per patient, no anticoagulants PTA.   Goal of Therapy:  Heparin level 0.3-0.7 units/ml Monitor platelets by anticoagulation protocol: Yes   Plan:  Give 5000 units bolus x 1 Start heparin infusion at 1700 units/hr Check anti-Xa level in 6 hours and daily while on  heparin Continue to monitor H&H and platelets   12/29:  HL @ 22:00 = 0.36 Will continue this pt on current rate of 1700 units/hr and recheck HL on 12/30 @ 0400.   12/30 AM heparin level 0.44. Continue current regimen. Recheck heparin level and CBC tomorrow AM.  12/31 AM heparin level 0.35. Continue current regimen.  CBC and heparin level with tomorrow AM labs.  1/1  0609 HL therapeutic at 0.56 Continue current regimen. Recheck with AM labs.   1/2 0440 HL remains therapeutic at 0.37. Continue current rate 1700units/hr. Will recheck HL and CBC with AM labs.   1/2: Had Thrombolysis procedure today(alteplase given). Heparin drip restarted at previous rate of 1700 units/hr after procedure. Patient returned to floor with Heparin drip running. See Dr. Delana Meyer consult order. Check Heparin level in 6 hrs. Goal for first 24 hrs= 0.3-0.5, then 0.3-0.7.    Noralee Space, PharmD, BCPS Clinical Pharmacist 10/22/2016 12:12 PM

## 2016-10-22 NOTE — Op Note (Signed)
[facility]                                                                   OPERATIVE NOTE    PRE-OPERATIVE DIAGNOSIS: Symptomatic right leg DVT; pulmonary embolism;   POST-OPERATIVE DIAGNOSIS: Same  PROCEDURE: 1. Ultrasound guidance for vascular access to the left common femoral vein and right popliteal vein 2. Catheter placement into the inferior vena cava for placement of IVC filter 3. Inferior venacavogram 4. Placement of a Ceslect IVC filter infrarenal 5. Introduction catheter into venous system second order catheter placement right popliteal approach 6. Infusion thrombolysis with 14 mg of TPA 7. Mechanical thrombectomy of the popliteal, SFV common femoral vein using the Penumbra catheter 8.  Percutaneous transluminal angioplasty to 7 mm right superficial femoral vein and common femoral vein  SURGEON: Hortencia Pilar  ASSISTANT(S): None  ANESTHESIA: local/sedation  ESTIMATED BLOOD LOSS: minimal  Contrast: 35 cc  Fluoroscopy time: 8.6 minutes  FINDING(S): 1. Patent IVC; thrombus within the right popliteal, superficial femoral vein and common femoral vein  SPECIMEN(S): none  INDICATIONS:  Lance Cohen is a 46 y.o. year old male who presents with massive swelling of the right leg that is quite painful in association with pleuritic chest pains and hypoxia as well as shortness of breath. Inferior vena cava filter and venous thrombolysis is indicated for this reason. Risks and benefits including filter thrombosis, migration, fracture, bleeding, and infection were all discussed. We discussed that all IVC filters that we place can be removed if desired from the patient once the need for the filter has passed.   DESCRIPTION: After obtaining full informed written consent, the patient was brought back to the vascular suite. The skin was sterilely prepped and draped in a sterile  surgical field was created. The left common femoral vein was accessed under direct ultrasound guidance without difficulty with a Seldinger needle and a J-wire was then placed. The dilator is passed over the wire and the delivery sheath was placed into the inferior vena cava. Inferior venacavogram was performed. This demonstrated a patent IVC with the level of the renal veins at L1-L2. The filter was then deployed into the inferior vena cava at the level of inferior margin of L2 just below the renal veins. The delivery sheath was then removed. Pressure was held. Sterile dressings were placed. The patient tolerated the procedure well.  He was then repositioned to the prone and his popliteal fossa of the right leg was prepped and draped in a sterile fashion. Ultrasound was placed in a sterile sleeve. Ultrasound is utilized to gain access to the popliteal vein. Vein is known to have thrombus within it by previous ultrasound. It is therefore identified by its enlarged size with echolucent thrombus and non-compressibility. 1% lidocaine is infiltrated in soft tissues and subsequent microneedle is inserted into the popliteal vein without difficulty. Images were recorded for the permanent record and the puncture is made under real-time visualization. Microwire is then advanced under fluoroscopic guidance and noted  to follow the path of the superficial femoral vein. Therefore the micro-sheath was placed followed by a Magic torque wire and subsequently an 8 [place] sheath.  KMP catheter together with the Magic torque wire then advanced up to the level of the common iliac and hand injection contrast is utilized to demonstrate that the common iliac vein and the external iliac vein on the right is patent the inferior vena cava is known to be patent from the above filter placement. Catheter is then repositioned to the common femoral level and hand injection contrast demonstrates that there is a near occlusive thrombus within  the common femoral however the external iliac vein on the left appears to be patent. Repositioning of the KMP catheter then demonstrated occlusive thrombus within the superficial femoral vein and popliteal vein.  Penumbra catheter is then prepped on the field and 14 mg of TPA is reconstituted 100 cc. This is then laced throughout the common femoral superficial femoral and popliteal veins. The TPA is then allowed to dwell. Subsequently, the penumbra catheter is engaged in the aspiration mode and aspiration of the common femoral as well as the popliteal and superficial femoral veins is performed multiple passes are made with the volumes recorded in the procedure notes.  Follow-up imaging now demonstrates that large portion of the thrombus is been eliminated from the proximal popliteal as well as the superficial femoral vein. There does appear to be  abnormality of the vein such as a stricture or stenosis. Within the superficial femoral and common femoral vein there is a high-grade residual narrowing and therefore a 7 x 200 mm [place] balloon is inflated to 16 atm for 1 minute. Several inflations were performed to cover the entire length of the vein Follow-up imaging demonstrates there is some residual thrombus but a marked improvement and therefore the penumbra is advanced to this level and multiple short passes are made through this region. After this maneuver follow-up imaging demonstrates near total resolution within the common femoral vein. Iliac veins are unchanged and are magnified image of the IVC and filter demonstrates that the IVC and the filter remained widely patent with no evidence of thrombus within the filter. Given that the patient now is widely patent with minimal residual thrombus from the distal popliteal to the IVC the procedure is terminated. The wires removed the sheath is removed pressures held and a pressure dressing with Coban and is then applied. The patient is then returned to the supine  position  Interpretation: Initial images demonstrated normal vena cava filter is placed without difficulty. Initial images the left lower extremity then demonstrated extensive thrombus throughout the popliteal and femoral veins however the iliac veins remained widely patent. Following intervention described above there is near-total resolution of thrombus throughout.  COMPLICATIONS: None  CONDITION: Stable  Hortencia Pilar  10/22/2016,10:45 AM

## 2016-10-22 NOTE — H&P (Signed)
[facility] History & Physical Update  The patient was interviewed and re-examined.  The patient's previous History and Physical has been reviewed and is unchanged.  There is no change in the plan of care. We plan to proceed with the scheduled procedure.  Hortencia Pilar, MD  10/22/2016, 8:06 AM

## 2016-10-22 NOTE — Progress Notes (Signed)
Patient handoff report given to special procedures. Patient transported off unit 0830. VSS.

## 2016-10-22 NOTE — Progress Notes (Signed)
[facility] [place] NAME: Lance Cohen    MR#:  [id number]  DATE OF BIRTH:  [birth year]  SUBJECTIVE:   Patient here due to right lower extremity swelling and pain and noted to have an extensive right lower extremity DVT. The patient underwent inferior venal filter placement, thrombolysis with TPA, mechanical thrombectomy of popliteal as SFV, common femoral vein, percutaneous transluminal angioplasty of right superficial femoral vein and common femoral vein. Continues to have some pain and swelling, no has Kerlix wrapped all around his right lower extremity. Remains on a heparin drip. No hemoptysis, shortness of breath. Complains of being unhappy being in the hospital, wants to go home, feels miserable, requests IV lines to be discontinued  REVIEW OF SYSTEMS:    Review of Systems  Constitutional: Negative for chills and fever.  HENT: Negative for congestion and tinnitus.   Eyes: Negative for blurred vision and double vision.  Respiratory: Negative for cough, shortness of breath and wheezing.   Cardiovascular: Positive for leg swelling. Negative for chest pain, orthopnea and PND.  Gastrointestinal: Negative for abdominal pain, diarrhea, nausea and vomiting.  Genitourinary: Negative for dysuria and hematuria.  Neurological: Negative for dizziness, sensory change and focal weakness.  All other systems reviewed and are negative.   Nutrition: Regular Tolerating Diet: Yes Tolerating PT: Await Eval.   DRUG ALLERGIES:   Allergies  Allergen Reactions  . Bee Venom Swelling  . Penicillins Other (See Comments)    PT states that he had symptoms that felt like he was having a heart attack Has patient had a PCN reaction causing immediate rash, facial/tongue/throat swelling, SOB or lightheadedness with hypotension: No Has patient had a PCN reaction causing severe rash involving mucus membranes or skin necrosis: No Has patient had a PCN reaction that  required hospitalization No Has patient had a PCN reaction occurring within the last 10 years: No If all of the above answers are "NO", then may proceed with Cephal  . Sulfa Antibiotics Itching    VITALS:  Blood pressure 135/88, pulse (!) 56, temperature 98 F (36.7 C), temperature source Oral, resp. rate 16, height 5\' 11"  (1.803 m), weight 108.9 kg (240 lb), SpO2 96 %.  PHYSICAL EXAMINATION:   Physical Exam  GENERAL:  46 y.o.-year-old patient lying in the bed in no acute distress, poor eye contact.  EYES: Pupils equal, round, reactive to light and accommodation. No scleral icterus. Extraocular muscles intact.  HEENT: Head atraumatic, normocephalic. Oropharynx and nasopharynx clear.  NECK:  Supple, no jugular venous distention. No thyroid enlargement, no tenderness.  LUNGS: Normal breath sounds bilaterally, no wheezing, rales, rhonchi. No use of accessory muscles of respiration.  CARDIOVASCULAR: S1, S2 normal. No murmurs, rubs, or gallops.  ABDOMEN: Soft, nontender, nondistended. Bowel sounds present. No organomegaly or mass.  EXTREMITIES: No cyanosis, clubbing or edema b/l.   Right lower extremity is wrapped with Kerlix up to lower thigh, some swelling and pain on palpation of thigh on the right, no pain on the left, no swelling NEUROLOGIC: Cranial nerves II through XII are intact. No focal Motor or sensory deficits b/l.  PSYCHIATRIC: The patient is alert and oriented x 3.  SKIN: No obvious rash, lesion, or ulcer.    LABORATORY PANEL:   CBC  Recent Labs Lab 10/22/16 1243  WBC 8.3  HGB 19.0*  HCT 54.4*  PLT 284   ------------------------------------------------------------------------------------------------------------------  Chemistries   Recent Labs Lab 10/22/16 1243  NA 135  K 4.0  CL  101  CO2 24  GLUCOSE 100*  BUN 7  CREATININE 1.11  CALCIUM 9.1    ------------------------------------------------------------------------------------------------------------------  Cardiac Enzymes No results for input(s): TROPONINI in the last 168 hours. ------------------------------------------------------------------------------------------------------------------  RADIOLOGY:  No results found.   ASSESSMENT AND PLAN:   46 year old male with past medical history of hypertension who presented to the hospital with right lower extremity hand swelling and noted to have an extensive DVT.  1. Right lower extremity extensive DVT/pulmonary embolism-unlikely unprovoked thromboembolism as he has no risk factors. -Thrombophilia workup so far has been negative. -Continue heparin nomogram. Status post nferior venal filter placement, thrombolysis with TPA, mechanical thrombectomy of popliteal as SFV, common femoral vein, percutaneous transluminal angioplasty of right superficial femoral vein and common femoral vein by Dr. Delana Meyer 10/22/2016. Initiate patient on liquids on Xarelto tomorrow morning, per vascular surgery  2. Essential HTN - cont. Norvasc.   3. Tobacco abuse - cont. Nicotine patch.   4. Polycythemia, questionable secondary or primary, patient may benefit from further evaluation as outpatient  All the records are reviewed and case discussed with Care Management/Social Worker. Management plans discussed with the patient, family and they are in agreement.  CODE STATUS: Full Code  DVT Prophylaxis: Heparin gtt  TOTAL TIME TAKING CARE OF THIS PATIENT: 30 minutes.   POSSIBLE D/C IN 2-3 DAYS, DEPENDING ON CLINICAL CONDITION.   Theodoro Grist M.D on 10/22/2016 at 2:09 PM  Between 7am to 6pm - Pager - [phone]  After 6pm go to [email or web address] - [employer] [facility]  Office  [phone]  CC: Primary care physician; No PCP Per Patient

## 2016-10-22 NOTE — Progress Notes (Signed)
Report given to [facility] on the floor. All pertainent info relayed to Rn. All questions answered. Pt has PAD device that is dry and intact on left groin. No signs of hematoma or drainage or  Bleeding. Pt alert and oriented pink warm and dry resp regular and unlabored. NSR on monitor. Right leg coban dressing dry and intact. Pedal pulses strong. Unable to assess tibial due to dressing. Pt readied to go to floor.

## 2016-10-22 NOTE — Progress Notes (Addendum)
ANTICOAGULATION CONSULT NOTE - follow up [facility] [place] for Heparin drip Indication: DVT/PE  Allergies  Allergen Reactions  . Bee Venom Swelling  . Penicillins Other (See Comments)    PT states that he had symptoms that felt like he was having a heart attack Has patient had a PCN reaction causing immediate rash, facial/tongue/throat swelling, SOB or lightheadedness with hypotension: No Has patient had a PCN reaction causing severe rash involving mucus membranes or skin necrosis: No Has patient had a PCN reaction that required hospitalization No Has patient had a PCN reaction occurring within the last 10 years: No If all of the above answers are "NO", then may proceed with Cephal  . Sulfa Antibiotics Itching    Patient Measurements: Height: 5\' 11"  (180.3 cm) Weight: 240 lb (108.9 kg) IBW/kg (Calculated) : 75.3 Heparin Dosing Weight: 98.5 kg  Vital Signs: Temp: 98.5 F (36.9 C) (01/02 2037) Temp Source: Oral (01/02 2037) BP: 118/77 (01/02 2037) Pulse Rate: 66 (01/02 2037)  Labs:  Recent Labs  10/21/16 0609 10/22/16 0440 10/22/16 1243 10/22/16 1615  HGB 17.4 17.2 19.0*  --   HCT 49.5 48.8 54.4*  --   PLT 292 296 284  --   LABPROT  --  14.4  --   --   INR  --  1.11  --   --   HEPARINUNFRC 0.56 0.37  --  0.46  CREATININE  --   --  1.11  --     Estimated Creatinine Clearance: 106.5 mL/min (by C-G formula based on SCr of 1.11 mg/dL).   Medical History: Past Medical History:  Diagnosis Date  . Asthma   . Bipolar 1 disorder ([place])   . Chronic knee pain   . Hypertension   . Suicide attempt    2010 Intentional overdose attempt with Depakote.    Assessment: 46 y/o M with a h/o asthma and bipolar disorder with calf pain x 3 weeks to begin IV heparin infusion for DVT. Per patient, no anticoagulants PTA.   Goal of Therapy:  Heparin level 0.3-0.7 units/ml Monitor platelets by anticoagulation protocol: Yes   Plan:  Give 5000 units bolus x 1 Start  heparin infusion at 1700 units/hr Check anti-Xa level in 6 hours and daily while on heparin Continue to monitor H&H and platelets   12/29:  HL @ 22:00 = 0.36 Will continue this pt on current rate of 1700 units/hr and recheck HL on 12/30 @ 0400.   12/30 AM heparin level 0.44. Continue current regimen. Recheck heparin level and CBC tomorrow AM.  12/31 AM heparin level 0.35. Continue current regimen.  CBC and heparin level with tomorrow AM labs.  1/1  0609 HL therapeutic at 0.56 Continue current regimen. Recheck with AM labs.   1/2 0440 HL remains therapeutic at 0.37. Continue current rate 1700units/hr. Will recheck HL and CBC with AM labs.   1/2: Had Thrombolysis procedure today(alteplase given). Heparin drip restarted at previous rate of 1700 units/hr after procedure. Patient returned to floor with Heparin drip running. See Dr. Delana Meyer consult order. Check Heparin level in 6 hrs. Goal for first 24 hrs= 0.3-0.5, then 0.3-0.7.  1/2 1615 HL therapeutic x 1. Continue current rate. Will recheck HL in 6 hours.  1/2 2224 HL therapeutic x 2. Continue current rate. Pharmacy will continue to monitor level daily.  Laural Benes, PharmD, BCPS Clinical Pharmacist 10/22/2016 9:45 PM

## 2016-10-22 NOTE — Progress Notes (Signed)
ANTICOAGULATION CONSULT NOTE - Initial Consult  Pharmacy Consult for Heparin drip Indication: DVT  Allergies  Allergen Reactions  . Bee Venom Swelling  . Penicillins Other (See Comments)    PT states that he had symptoms that felt like he was having a heart attack Has patient had a PCN reaction causing immediate rash, facial/tongue/throat swelling, SOB or lightheadedness with hypotension: No Has patient had a PCN reaction causing severe rash involving mucus membranes or skin necrosis: No Has patient had a PCN reaction that required hospitalization No Has patient had a PCN reaction occurring within the last 10 years: No If all of the above answers are "NO", then may proceed with Cephal  . Sulfa Antibiotics Itching    Patient Measurements: Height: 5\' 11"  (180.3 cm) Weight: 242 lb 8 oz (110 kg) IBW/kg (Calculated) : 75.3 Heparin Dosing Weight: 98.5 kg  Vital Signs: Temp: 97.9 F (36.6 C) (01/02 0528) Temp Source: Oral (01/02 0528) BP: 125/74 (01/02 0528) Pulse Rate: 70 (01/02 0528)  Labs:  Recent Labs  10/20/16 0628 10/21/16 0609 10/22/16 0440  HGB 16.7 17.4 17.2  HCT 47.6 49.5 48.8  PLT 268 292 296  LABPROT  --   --  14.4  INR  --   --  1.11  HEPARINUNFRC 0.35 0.56 0.37    Estimated Creatinine Clearance: 111.2 mL/min (by C-G formula based on SCr of 1.07 mg/dL).   Medical History: Past Medical History:  Diagnosis Date  . Asthma   . Bipolar 1 disorder ([place])   . Chronic knee pain   . Hypertension   . Suicide attempt    2010 Intentional overdose attempt with Depakote.    Assessment: 46 y/o M with a h/o asthma and bipolar disorder with calf pain x 3 weeks to begin IV heparin infusion for DVT. Per patient, no anticoagulants PTA.   Goal of Therapy:  Heparin level 0.3-0.7 units/ml Monitor platelets by anticoagulation protocol: Yes   Plan:  Give 5000 units bolus x 1 Start heparin infusion at 1700 units/hr Check anti-Xa level in 6 hours and daily while on  heparin Continue to monitor H&H and platelets   12/29:  HL @ 22:00 = 0.36 Will continue this pt on current rate of 1700 units/hr and recheck HL on 12/30 @ 0400.   12/30 AM heparin level 0.44. Continue current regimen. Recheck heparin level and CBC tomorrow AM.  12/31 AM heparin level 0.35. Continue current regimen.  CBC and heparin level with tomorrow AM labs.  1/1  0609 HL therapeutic at 0.56 Continue current regimen. Recheck with AM labs.   1/2 0440 HL remains therapeutic at 0.37. Continue current rate 1700units/hr. Will recheck HL and CBC with AM labs.   Pernell Dupre, PharmD, BCPS Clinical Pharmacist 10/22/2016 5:36 AM

## 2016-10-23 ENCOUNTER — Encounter: Payer: Self-pay | Admitting: Vascular Surgery

## 2016-10-23 DIAGNOSIS — Z716 Tobacco abuse counseling: Secondary | ICD-10-CM

## 2016-10-23 DIAGNOSIS — R112 Nausea with vomiting, unspecified: Secondary | ICD-10-CM

## 2016-10-23 DIAGNOSIS — I2699 Other pulmonary embolism without acute cor pulmonale: Secondary | ICD-10-CM

## 2016-10-23 DIAGNOSIS — Z95828 Presence of other vascular implants and grafts: Secondary | ICD-10-CM

## 2016-10-23 DIAGNOSIS — Z86711 Personal history of pulmonary embolism: Secondary | ICD-10-CM

## 2016-10-23 LAB — PROTHROMBIN GENE MUTATION

## 2016-10-23 LAB — CBC
HCT: 50.9 % (ref 40.0–52.0)
HEMOGLOBIN: 17.7 g/dL (ref 13.0–18.0)
MCH: 36.6 pg — AB (ref 26.0–34.0)
MCHC: 34.7 g/dL (ref 32.0–36.0)
MCV: 105.3 fL — ABNORMAL HIGH (ref 80.0–100.0)
PLATELETS: 246 10*3/uL (ref 150–440)
RBC: 4.84 MIL/uL (ref 4.40–5.90)
RDW: 14.9 % — ABNORMAL HIGH (ref 11.5–14.5)
WBC: 10.1 10*3/uL (ref 3.8–10.6)

## 2016-10-23 MED ORDER — DOCUSATE SODIUM 100 MG PO CAPS: 100.0000 mg | ORAL_CAPSULE | Freq: Two times a day (BID) | ORAL | 0 refills | Status: DC

## 2016-10-23 MED ORDER — APIXABAN 5 MG PO TABS
10.0000 mg | ORAL_TABLET | Freq: Two times a day (BID) | ORAL | Status: DC
  Administered 2016-10-23: 10 mg via ORAL
  Filled 2016-10-23 (×2): qty 2

## 2016-10-23 MED ORDER — PROMETHAZINE HCL 25 MG/ML IJ SOLN
12.5000 mg | INTRAMUSCULAR | Status: DC | PRN
  Administered 2016-10-23: 12.5 mg via INTRAVENOUS
  Filled 2016-10-23: qty 1

## 2016-10-23 MED ORDER — HYDROCORTISONE 1 % EX CREA
TOPICAL_CREAM | Freq: Two times a day (BID) | CUTANEOUS | Status: DC
  Administered 2016-10-23: 1 via TOPICAL
  Filled 2016-10-23: qty 28

## 2016-10-23 MED ORDER — APIXABAN 5 MG PO TABS: 5.0000 mg | ORAL_TABLET | Freq: Two times a day (BID) | ORAL | 6 refills | Status: DC

## 2016-10-23 MED ORDER — OXYCODONE-ACETAMINOPHEN 7.5-325 MG PO TABS: 1.0000 | ORAL_TABLET | ORAL | 0 refills | Status: DC | PRN

## 2016-10-23 MED ORDER — APIXABAN 5 MG PO TABS: 5.0000 mg | ORAL_TABLET | Freq: Two times a day (BID) | ORAL | Status: DC

## 2016-10-23 MED ORDER — POLYETHYLENE GLYCOL 3350 17 G PO PACK: 17.0000 g | PACK | Freq: Every day | ORAL | 0 refills | Status: DC | PRN

## 2016-10-23 MED ORDER — NICOTINE 14 MG/24HR TD PT24: 14.0000 mg | MEDICATED_PATCH | Freq: Every day | TRANSDERMAL | 0 refills | Status: DC

## 2016-10-23 MED ORDER — APIXABAN 5 MG PO TABS: 10.0000 mg | ORAL_TABLET | Freq: Two times a day (BID) | ORAL | 0 refills | Status: DC

## 2016-10-23 NOTE — Discharge Summary (Signed)
Lance Cohen at [place] NAME: Lance Cohen    MR#:  [id number]  DATE OF BIRTH:  10-Mar-1971  DATE OF ADMISSION:  10/18/2016 ADMITTING PHYSICIAN: Hillary Bow, MD  DATE OF DISCHARGE: 10/23/2016  2:55 PM  PRIMARY CARE PHYSICIAN: No PCP Per Patient     ADMISSION DIAGNOSIS:  SOB (shortness of breath) [R06.02] Acute deep vein thrombosis (DVT) of femoral vein of right lower extremity (HCC) [I82.411]  DISCHARGE DIAGNOSIS:  Principal Problem:   DVT (deep vein thrombosis) in pregnancy (Lance Cohen) Active Problems:   Acute pulmonary embolism (HCC)   S/P IVC filter   Tobacco abuse counseling   Nausea and vomiting   SECONDARY DIAGNOSIS:   Past Medical History:  Diagnosis Date  . Asthma   . Bipolar 1 disorder ([place])   . Chronic knee pain   . Hypertension   . Suicide attempt    2010 Intentional overdose attempt with Depakote.    .pro HOSPITAL COURSE:   The patient is 46 year old Caucasian male with past history significant for history of bipolar disorder, asthma, hypertension, depression, suicide attempt, ongoing tobacco abuse, who presents to the hospital with complaints of Right lower extremity pain and swelling, worsening over the past 2 weeks. Ultrasound in emergency room revealed extensive DVT. He also admitted of having some shortness of breath and upper back pain. CT angiogram revealed bilateral pulmonary emboli. Patient was initiated on heparin IV drip and vascular surgery consultation was obtained. Vessel surgery recommended procedure, which was performed 10/22/2016 by Dr. Delana Meyer. Post procedure patient did well, he was initiated at a liquids and recommended to continue loading dose as outpatient. He was felt to be stable to be discharged home today. He is to follow-up with vascular surgery and primary care physician, to establish primary care physician as outpatient in about one week after discharge. Discussion by problem: #1 Right  lower extremity DVT/pulmonary embolism, unprovoked pulmonary embolism with no significant risk factors except of smoking, thrombophilia workup was performed and was negative, sensitivities were pending, patient was treated with heparin intravenously while in the hospital, he underwent inferior vena cava filter placement,thrombolysis with TPA, mechanical thrombectomy of popliteal as SFV, common femoral vein, percutaneous transluminal angioplasty of right superficial femoral vein and common femoral vein by Dr. Delana Meyer 10/22/2016. The patient was initiated on Eliquis loading dose, which he is to continue for the next 7 days, then continue maintenance doses. He is to follow-up with his primary care physician and vascular surgeon within one week after discharge for further recommendations #2. Essential hypertension, patient is to continue Norvasc, blood pressure was well controlled #3. Tobacco abuse counseling, nicotine replacement therapy was initiated, discussed this patient for at least 3-4 minutes while in the hospital #4. Polycythemia, secondary versus primary, patient would need to have further evaluation as outpatient , possibly related to tobacco abuse.  DISCHARGE CONDITIONS:   stable  CONSULTS OBTAINED:  Treatment Team:  [name], MD  DRUG ALLERGIES:   Allergies  Allergen Reactions  . Bee Venom Swelling  . Penicillins Other (See Comments)    PT states that he had symptoms that felt like he was having a heart attack Has patient had a PCN reaction causing immediate rash, facial/tongue/throat swelling, SOB or lightheadedness with hypotension: No Has patient had a PCN reaction causing severe rash involving mucus membranes or skin necrosis: No Has patient had a PCN reaction that required hospitalization No Has patient had a PCN reaction occurring within the last 10 years:  No If all of the above answers are "NO", then may proceed with Cephal  . Sulfa Antibiotics Itching    DISCHARGE  MEDICATIONS:   Discharge Medication List as of 10/23/2016  2:35 PM    START taking these medications   Details  !! apixaban (ELIQUIS) 5 MG TABS tablet Take 2 tablets (10 mg total) by mouth 2 (two) times daily., Starting Wed 10/23/2016, Normal    !! apixaban (ELIQUIS) 5 MG TABS tablet Take 1 tablet (5 mg total) by mouth 2 (two) times daily., Starting Wed 10/30/2016, Normal    docusate sodium (COLACE) 100 MG capsule Take 1 capsule (100 mg total) by mouth 2 (two) times daily., Starting Wed 10/23/2016, Normal    nicotine (NICODERM CQ - DOSED IN MG/24 HOURS) 14 mg/24hr patch Place 1 patch (14 mg total) onto the skin daily., Starting Thu 10/24/2016, Normal    oxyCODONE-acetaminophen (PERCOCET) 7.5-325 MG tablet Take 1 tablet by mouth every 4 (four) hours as needed for severe pain., Starting Wed 10/23/2016, Print    polyethylene glycol (MIRALAX / GLYCOLAX) packet Take 17 g by mouth daily as needed for mild constipation., Starting Wed 10/23/2016, Normal     !! - Potential duplicate medications found. Please discuss with provider.    CONTINUE these medications which have NOT CHANGED   Details  ibuprofen (ADVIL,MOTRIN) 800 MG tablet Take 1 tablet (800 mg total) by mouth 3 (three) times daily., Starting 04/26/2015, Until Discontinued, Print      STOP taking these medications     clindamycin (CLEOCIN) 150 MG capsule          DISCHARGE INSTRUCTIONS:    Patient is to follow-up with primary care physician, vascular surgeon within one week after discharge  If you experience worsening of your admission symptoms, develop shortness of breath, life threatening emergency, suicidal or homicidal thoughts you must seek medical attention immediately by calling 911 or calling your MD immediately  if symptoms less severe.  You Must read complete instructions/literature along with all the possible adverse reactions/side effects for all the Medicines you take and that have been prescribed to you. Take any new Medicines  after you have completely understood and accept all the possible adverse reactions/side effects.   Please note  You were cared for by a hospitalist during your hospital stay. If you have any questions about your discharge medications or the care you received while you were in the hospital after you are discharged, you can call the unit and asked to speak with the hospitalist on call if the hospitalist that took care of you is not available. Once you are discharged, your primary care physician will handle any further medical issues. Please note that NO REFILLS for any discharge medications will be authorized once you are discharged, as it is imperative that you return to your primary care physician (or establish a relationship with a primary care physician if you do not have one) for your aftercare needs so that they can reassess your need for medications and monitor your lab values.    Today   CHIEF COMPLAINT:   Chief Complaint  Patient presents with  . Leg Pain    HISTORY OF PRESENT ILLNESS:  Cu[place]ut[place]oc Tumey  is a 46 y.o. male with a known history of bipolar disorder, asthma, hypertension, depression, suicide attempt, ongoing tobacco abuse, who presents to the hospital with complaints of Right lower extremity pain and swelling, worsening over the past 2 weeks. Ultrasound in emergency room revealed extensive DVT. He also  admitted of having some shortness of breath and upper back pain. CT angiogram revealed bilateral pulmonary emboli. Patient was initiated on heparin IV drip and vascular surgery consultation was obtained. Vessel surgery recommended procedure, which was performed 10/22/2016 by Dr. Delana Meyer. Post procedure patient did well, he was initiated at a liquids and recommended to continue loading dose as outpatient. He was felt to be stable to be discharged home today. He is to follow-up with vascular surgery and primary care physician, to establish primary care physician as outpatient in about  one week after discharge. Discussion by problem: #1 Right lower extremity DVT/pulmonary embolism, unprovoked pulmonary embolism with no significant risk factors except of smoking, thrombophilia workup was performed and was negative, sensitivities were pending, patient was treated with heparin intravenously while in the hospital, he underwent inferior vena cava filter placement,thrombolysis with TPA, mechanical thrombectomy of popliteal as SFV, common femoral vein, percutaneous transluminal angioplasty of right superficial femoral vein and common femoral vein by Dr. Delana Meyer 10/22/2016. The patient was initiated on Eliquis loading dose, which he is to continue for the next 7 days, then continue maintenance doses. He is to follow-up with his primary care physician and vascular surgeon within one week after discharge for further recommendations #2. Essential hypertension, patient is to continue Norvasc, blood pressure was well controlled #3. Tobacco abuse counseling, nicotine replacement therapy was initiated, discussed this patient for at least 3-4 minutes while in the hospital #4. Polycythemia, secondary versus primary, patient would need to have further evaluation as outpatient , possibly related to tobacco abuse.     VITAL SIGNS:  Blood pressure 109/67, pulse 65, temperature 98 F (36.7 C), temperature source Oral, resp. rate 16, height 5\' 11"  (1.803 m), weight 108.1 kg (238 lb 4.8 oz), SpO2 95 %.  I/O:   Intake/Output Summary (Last 24 hours) at 10/23/16 1614 Last data filed at 10/23/16 1350  Gross per 24 hour  Intake           1018.3 ml  Output                0 ml  Net           1018.3 ml    PHYSICAL EXAMINATION:  GENERAL:  46 y.o.-year-old patient lying in the bed with no acute distress.  EYES: Pupils equal, round, reactive to light and accommodation. No scleral icterus. Extraocular muscles intact.  HEENT: Head atraumatic, normocephalic. Oropharynx and nasopharynx clear.  NECK:  Supple,  no jugular venous distention. No thyroid enlargement, no tenderness.  LUNGS: Normal breath sounds bilaterally, no wheezing, rales,rhonchi or crepitation. No use of accessory muscles of respiration.  CARDIOVASCULAR: S1, S2 normal. No murmurs, rubs, or gallops.  ABDOMEN: Soft, non-tender, non-distended. Bowel sounds present. No organomegaly or mass.  EXTREMITIES: No pedal edema, cyanosis, or clubbing.  NEUROLOGIC: Cranial nerves II through XII are intact. Muscle strength 5/5 in all extremities. Sensation intact. Gait not checked.  PSYCHIATRIC: The patient is alert and oriented x 3.  SKIN: No obvious rash, lesion, or ulcer.   DATA REVIEW:   CBC  Recent Labs Lab 10/23/16 0516  WBC 10.1  HGB 17.7  HCT 50.9  PLT 246    Chemistries   Recent Labs Lab 10/22/16 1243  NA 135  K 4.0  CL 101  CO2 24  GLUCOSE 100*  BUN 7  CREATININE 1.11  CALCIUM 9.1    Cardiac Enzymes No results for input(s): TROPONINI in the last 168 hours.  Microbiology Results  Results for orders  placed or performed during the hospital encounter of 11/13/13  Urine culture     Status: None   Collection Time: 11/13/13 11:17 AM  Result Value Ref Range Status   Specimen Description URINE, CLEAN CATCH  Final   Special Requests NONE  Final   Culture  Setup Time   Final    11/13/2013 23:07 Performed at [facility]   Final    4,000 COLONIES/ML Performed at [facility]   Culture   Final    INSIGNIFICANT GROWTH Performed at [facility]   Report Status 11/14/2013 FINAL  Final    RADIOLOGY:  No results found.  EKG:  No orders found for this or any previous visit.    Management plans discussed with the patient, family and they are in agreement.  CODE STATUS:     Code Status Orders        Start     Ordered   10/18/16 1537  Full code  Continuous     10/18/16 1537    Code Status History    Date Active Date Inactive Code Status Order ID Comments User  Context   06/24/2011  4:32 PM 06/25/2011  1:46 PM Full Code [id number]  Rexene Alberts, MD Inpatient      TOTAL TIME TAKING CARE OF THIS PATIENT: 40  minutes.    Theodoro Grist M.D on 10/23/2016 at 4:14 PM  Between 7am to 6pm - Pager - [phone]  After 6pm go to [email or web address] - password [email or web address] [facility]  [facility]  Office  [phone]  CC: Primary care physician; No PCP Per Patient

## 2016-10-23 NOTE — Discharge Instructions (Signed)
Venous Thromboembolism °Venous thromboembolism (VTE) is a condition in which a blood clot (thrombus) develops in the body. A thrombus usually occurs in a deep vein in the leg or the pelvis, but it can also occur in the arm. Sometimes, pieces of a thrombus can break off from its original place of development and travel through the bloodstream to other parts of the body. When that happens, the thrombus is called an embolism. An embolism can block the blood flow in the blood vessels of other organs. °There are two serious types of VTE: °· Deep vein thrombosis (DVT). A DVT is a thrombus that usually occurs in a deep, larger vein of the lower leg or the pelvis, or in an upper extremity such as the arm. °· Pulmonary embolism (PE). A PE occurs when an embolism has formed and traveled to the lungs. A PE can block or decrease the blood flow in one lung or both lungs. °VTE is a serious health condition that can cause disability or death. It is very important to get help right away and to not ignore symptoms. °What are the causes? °VTE is caused by the formation of a blood clot in your leg, pelvis, or arm. Usually, several things contribute to the formation of blood clots. A clot may develop when: °· Your blood flow slows down. °· Your vein becomes damaged in some way. °· You have a condition that makes your blood clot more easily. °What increases the risk? °A VTE is more likely to develop in: °· People who are older, especially over 60 years of age. °· People who are overweight (obese). °· People who sit or lie still for a long time, such as during long-distance travel (over 4 hours), bed rest, hospitalization, or during recovery from certain medical conditions like a stroke. °· People who do not engage in much physical activity (sedentary lifestyle). °· People who have chronic breathing disorders. °· People who have a personal or family history of blood clots or blood clotting disease. °· People who have peripheral vascular  disease (PVD), diabetes, or some types of cancer. °· People who have heart disease, especially if the person had a recent heart attack or has congestive heart failure. °· People who have neurological diseases that affect the legs (leg paresis). °· People who have had a traumatic injury, such as breaking a hip or leg. °· People who have recently had major or lengthy surgery, especially on the hip, knee, or abdomen. °· People who have had a central line placed inside a large vein. °· People who take medicines that contain the hormone estrogen. These include birth control pills and hormone replacement therapy. °· Pregnancy or during childbirth or the postpartum period. °· Long plane flights (over 8 hours). °What are the signs or symptoms? °Symptoms of VTE can depend on where the clot is located and whether the clot breaks off and travels to another organ. Sometimes, there may be no symptoms. °Symptoms of a DVT can include: °· Swelling of your leg or arm, especially if one side is much worse. °· Warmth and redness of your leg or arm, especially if one side is much worse. °· Pain in your arm or leg. If the clot is in your leg, symptoms may be more noticeable or worse when you stand or walk. °· A feeling of pins and needles if the clot is in the arm. °The symptoms of a PE usually start suddenly and include: °· Shortness of breath while active or at rest. °·   Coughing or coughing up blood or blood-tinged mucus. °· Chest pain that is often worse with deep breaths. °· Rapid or irregular heartbeat. °· Feeling light-headed or dizzy. °· Fainting. °· Feeling anxious. °· Sweating. °There may also be pain and swelling in a leg if that is where the blood clot started. °These symptoms may represent a serious problem that is an emergency. Do not wait to see if the symptoms will go away. Get medical help right away. Call your local emergency services (911 in the [place] Do not drive yourself to the hospital.  °How is this  diagnosed? °Your health care provider will take a medical history and perform a physical exam. You may also have other tests, including: °· Blood tests to assess the clotting properties of your blood. °· Imaging tests, such as CT, ultrasound, MRI, X-ray, and other tests to see if you have clots anywhere in your body. °· An electrocardiogram (ECG) to look for heart strain from blood clots in the lungs. °· An echocardiogram. °How is this treated? °After a VTE is identified, it can be treated. The main goals of treatment are: °· To stop a blood clot from growing larger. °· To stop new blood clots from forming. °· To stop a blood clot from traveling to the lungs (pulmonary embolism). °The type of treatment that you receive depends on many factors, such as the cause of your VTE, your risk for bleeding or developing more clots, and other medical conditions that you have. Sometimes, a combination of treatments is necessary. Treatment options may be combined and include: °· Monitoring the blood clot with ultrasound. °· Taking medicines by mouth, such as newer blood thinners (anticoagulants), thrombolytics, or warfarin. °· Taking anticoagulant medicine by injection or through an IV tube. °· Wearing compression stockings or using different types of devices. °· Surgery (rare) to remove the blood clot or to place a filter in your abdomen to stop the blood clot from traveling to your lungs. °Treatments for VTE are often divided into immediate treatment and long-term treatment (up to 3 months after VTE). You can work with your health care provider to choose the treatment program that is best for you. °Follow these instructions at home: °If you are taking a newer oral anticoagulant: °· Take the medicine every single day at the same time each day. °· Understand what foods and drugs interact with this medicine. °· Understand that there are no regular blood tests required when using this medicine. °· Understand the side effects of  this medicine, including excessive bruising or bleeding. Ask your health care provider or pharmacist about other possible side effects. °If you are taking warfarin: °· Understand how to take warfarin and know which foods can affect how warfarin works in your body. °· Understand that it is dangerous to take too much or too little warfarin. Too much warfarin increases the risk of bleeding. Too little warfarin continues to allow the risk for blood clots. °· Follow your PT and INR blood testing schedule. The PT and INR results allow your health care provider to adjust your dose of warfarin. It is very important that you have your PT and INR tested as often as told by your health care provider. °· Avoid major changes in your diet, or tell your health care provider before you change your diet. Arrange a visit with a registered dietitian to answer your questions. Many foods, especially foods that are high in vitamin K, can interfere with warfarin and affect the PT and INR   results. Eat a consistent amount of foods that are high in vitamin K, such as: °¨ Spinach, kale, broccoli, cabbage, collard greens, turnip greens, Brussels sprouts, peas, cauliflower, seaweed, and parsley. °¨ Beef liver and pork liver. °¨ Green tea. °¨ Soybean oil. °· Tell your health care provider about any and all medicines, vitamins, and supplements that you take, including aspirin and other over-the-counter anti-inflammatory medicines. Be especially cautious with aspirin and anti-inflammatory medicines. Do not take those before you ask your health care provider if it is safe to do so. This is important because many medicines can interfere with warfarin and affect the PT and INR results. °· Do not start or stop taking any over-the-counter or prescription medicine unless your health care provider or pharmacist tells you to do so. °If you take warfarin, you will also need to do these things: °· Hold pressure over cuts for longer than usual. °· Tell your  dentist and other health care providers that you are taking warfarin before you have any procedures in which bleeding may occur. °· Avoid alcohol or drink very small amounts. Tell your health care provider if you change your alcohol intake. °· Do not use tobacco products, including cigarettes, chewing tobacco, and e-cigarettes. If you need help quitting, ask your health care provider. °· Avoid contact sports. °General instructions °· Take over-the-counter and prescription medicines only as told by your health care provider. Anticoagulant medicines can have side effects, including easy bruising and difficulty stopping bleeding. If you are prescribed an anticoagulant, you will also need to do these things: °¨ Hold pressure over cuts for longer than usual. °¨ Tell your dentist and other health care providers that you are taking anticoagulants before you have any procedures in which bleeding may occur. °¨ Avoid contact sports. °· Wear a medical alert bracelet or carry a medical alert card that says you have had a PE. °· Ask your health care provider how soon you can go back to your normal activities. Stay active to prevent new blood clots from forming. °· Make sure to exercise while traveling or when you have been sitting or standing for a long period of time. It is very important to exercise. Exercise your legs by walking or by tightening and relaxing your leg muscles often. Take frequent walks. °· Wear compression stockings as told by your health care provider to help prevent more blood clots from forming. °· Do not use tobacco products, including cigarettes, chewing tobacco, and e-cigarettes. If you need help quitting, ask your health care provider. °· Keep all follow-up appointments with your health care provider. This is important. °How is this prevented? °Take these actions to decrease your risk of developing another VTE: °· Exercise regularly. For at least 30 minutes every day, engage in: °¨ Activity that  involves moving your arms and legs. °¨ Activity that encourages good blood flow through your body by increasing your heart rate. °· Exercise your arms and legs every hour during long-distance travel (over 4 hours). Drink plenty of water and avoid drinking alcohol while traveling. °· Avoid sitting or lying in bed for long periods of time without moving your legs. °· Maintain a weight that is appropriate for your height. Ask your health care provider what weight is healthy for you. °· If you are a woman who is over 35 years of age, avoid unnecessary use of medicines that contain estrogen. These include birth control pills. °· Do not smoke, especially if you take estrogen medicines. If you need help   quitting, ask your health care provider. °If you are hospitalized, prevention measures may include: °· Early walking after surgery, as soon as your health care provider says that it is safe. °· Receiving anticoagulants to prevent blood clots. If you cannot take anticoagulants, other options may be available, such as wearing compression stockings or using different types of devices. °Get help right away if: °· You have new or increased pain, swelling, or redness in an arm or leg. °· You have numbness or tingling in an arm or leg. °· You have shortness of breath while active or at rest. °· You have chest pain. °· You have a rapid or irregular heartbeat. °· You feel light-headed or dizzy. °· You cough up blood. °· You notice blood in your vomit, bowel movement, or urine. °These symptoms may represent a serious problem that is an emergency. Do not wait to see if the symptoms will go away. Get medical help right away. Call your local emergency services (911 in the [place] Do not drive yourself to the hospital.  °This information is not intended to replace advice given to you by your health care provider. Make sure you discuss any questions you have with your health care provider. °Document Released: 08/04/2009 Document Revised:  03/20/2016 Document Reviewed: 02/01/2015 °[employer] Interactive Patient Education © 2017 [employer] ° °

## 2016-10-23 NOTE — Progress Notes (Signed)
Patient A&O, VSS.  Medicated x2 for pain with good relief.  Medicated for nausea x2 with good relief.  Discharge instructions reviewed in detail with patient.  Prescription provided.  Coupon for Eliquis provided.  IVs removed without complications.  Patient discharged home ambulatory in stable condition, refusing RN escort.

## 2016-10-23 NOTE — Care Management (Signed)
30 day eliquis coupon provided prior to discharge

## 2016-10-24 LAB — FACTOR 5 LEIDEN

## 2016-10-29 ENCOUNTER — Ambulatory Visit (INDEPENDENT_AMBULATORY_CARE_PROVIDER_SITE_OTHER): Payer: Medicaid Other | Admitting: Vascular Surgery

## 2016-10-29 ENCOUNTER — Encounter (INDEPENDENT_AMBULATORY_CARE_PROVIDER_SITE_OTHER): Payer: Self-pay | Admitting: Vascular Surgery

## 2016-10-29 VITALS — BP 129/81 | HR 74 | Resp 17 | Ht 71.0 in | Wt 237.0 lb

## 2016-10-29 DIAGNOSIS — O2230 Deep phlebothrombosis in pregnancy, unspecified trimester: Secondary | ICD-10-CM

## 2016-10-29 DIAGNOSIS — I2699 Other pulmonary embolism without acute cor pulmonale: Secondary | ICD-10-CM | POA: Diagnosis not present

## 2016-10-29 DIAGNOSIS — I82409 Acute embolism and thrombosis of unspecified deep veins of unspecified lower extremity: Secondary | ICD-10-CM

## 2016-10-29 NOTE — Progress Notes (Signed)
MRN : [id number]  Lance Cohen is a 46 y.o. (1971-02-02) male who presents with chief complaint of  Chief Complaint  Patient presents with  . New Patient (Initial Visit)  .  History of Present Illness: Patient returns today in follow up of his DVT and PE.  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 His leg is doing much better and only has mild swelling today. He says his pain is much better as well. He has tolerated anticoagulation so far.  Current Outpatient Prescriptions  Medication Sig Dispense Refill  . apixaban (ELIQUIS) 5 MG TABS tablet Take 2 tablets (10 mg total) by mouth 2 (two) times daily. 28 tablet 0  . [START ON 10/30/2016] apixaban (ELIQUIS) 5 MG TABS tablet Take 1 tablet (5 mg total) by mouth 2 (two) times daily. 60 tablet 6  . docusate sodium (COLACE) 100 MG capsule Take 1 capsule (100 mg total) by mouth 2 (two) times daily. 10 capsule 0  . ibuprofen (ADVIL,MOTRIN) 800 MG tablet Take 1 tablet (800 mg total) by mouth 3 (three) times daily. 21 tablet 0  . nicotine (NICODERM CQ - DOSED IN MG/24 HOURS) 14 mg/24hr patch Place 1 patch (14 mg total) onto the skin daily. 28 patch 0  . oxyCODONE-acetaminophen (PERCOCET) 7.5-325 MG tablet Take 1 tablet by mouth every 4 (four) hours as needed for severe pain. 30 tablet 0  . polyethylene glycol (MIRALAX / GLYCOLAX) packet Take 17 g by mouth daily as needed for mild constipation. 14 each 0   No current facility-administered medications for this visit.     Past Medical History:  Diagnosis Date  . Asthma   . Bipolar 1 disorder ([place])   . Chronic knee pain   . Hypertension   . Suicide attempt    2010 Intentional overdose attempt with Depakote.    Past Surgical History:  Procedure Laterality Date  . FOOT SURGERY     had peice of wire removed  . PERIPHERAL VASCULAR CATHETERIZATION Right 10/22/2016   Procedure: Thrombectomy, thrombolyisis;   Surgeon: [name], MD;  Location: [facility];  Service: Cardiovascular;  Laterality: Right;  . PERIPHERAL VASCULAR CATHETERIZATION N/A 10/22/2016   Procedure: IVC Filter Insertion;  Surgeon: [name], MD;  Location: [facility];  Service: Cardiovascular;  Laterality: N/A;    Social History Social History  Substance Use Topics  . Smoking status: Current Every Day Smoker    Packs/day: 0.50  . Smokeless tobacco: Never Used  . Alcohol use Yes     Comment: occasionally    Family History Family History  Problem Relation Age of Onset  . Cancer Mother   . Heart attack Father     Allergies  Allergen Reactions  . Bee Venom Swelling  . Penicillins Other (See Comments)    PT states that he had symptoms that felt like he was having a heart attack Has patient had a PCN reaction causing immediate rash, facial/tongue/throat swelling, SOB or lightheadedness with hypotension: No Has patient had a PCN reaction causing severe rash involving mucus membranes or skin necrosis: No Has patient had a PCN reaction that required hospitalization No Has patient had a PCN reaction occurring within the last 10 years: No If all of the above answers are "NO", then may proceed with Cephal  . Sulfa Antibiotics Itching     REVIEW OF SYSTEMS (Negative unless checked)  Constitutional: _0 Weight loss  _1 Fever  _2 Chills Cardiac: _3 Chest pain   _4 Chest pressure   _5 Palpitations   _6 Shortness of breath when laying flat   _7 Shortness of breath at rest   _8 Shortness of breath with exertion. Vascular:  _9 Pain in legs with walking   _10 Pain in legs at rest   _11 Pain in legs when laying flat   _12 Claudication   _13 Pain in feet when walking  _14 Pain in feet at rest  _15 Pain in feet when laying flat   _16 History of DVT   _17 Phlebitis   _18 Swelling in legs   _19 Varicose veins   _20 Non-healing ulcers Pulmonary:   _21 Uses home oxygen   _22 Productive cough   _23 Hemoptysis   _24 Wheeze  _25 COPD    _26 Asthma Neurologic:  _27 Dizziness  _28 Blackouts   _29 Seizures   _30 History of stroke   _31 History of TIA  _32 Aphasia   _33 Temporary blindness   _34 Dysphagia   _35 Weakness or numbness in arms   _36 Weakness or numbness in legs Musculoskeletal:  _37 Arthritis   _38 Joint swelling   _39 Joint pain   _40 Low back pain Hematologic:  _41 Easy bruising  _42 Easy bleeding   _43 Hypercoagulable state   _44 Anemic   Gastrointestinal:  _45 Blood in stool   _46 Vomiting blood  _47 Gastroesophageal reflux/heartburn   _48 Abdominal pain Genitourinary:  _49 Chronic kidney disease   _50 Difficult urination  _51 Frequent urination  _52 Burning with urination   _53 Hematuria Skin:  _54 Rashes   _55 Ulcers   _56 Wounds Psychological:  _57 History of anxiety   _58  History of major depression.  Physical Examination  BP 129/81   Pulse 74   Resp 17   Ht _59  (1.803 m)   Wt 237 lb (107.5 kg)   BMI 33.05 kg/m  Gen:  WD/WN, NAD Head: [place]/AT, No temporalis wasting. Ear/Nose/Throat: Hearing grossly intact, nares w/o erythema or drainage, trachea midline. Dentition poor. Eyes: Conjunctiva clear. Sclera non-icteric Neck: Supple.  No JVD.  Pulmonary:  Good air movement, no use of accessory muscles.  Cardiac: RRR, normal S1, S2 Vascular:  Vessel Right Left  Radial Palpable Palpable  Ulnar Palpable Palpable  Brachial Palpable Palpable  Carotid Palpable, without bruit Palpable, without bruit  Aorta Not palpable N/A  Femoral Palpable Palpable  Popliteal Palpable Palpable  PT Palpable Palpable  DP Palpable Palpable   Gastrointestinal: soft, non-tender/non-distended. No guarding/reflex.  Musculoskeletal: M/S 5/5 throughout.  No deformity or atrophy. 1+ RLE edema. Neurologic: Sensation grossly intact in extremities.  Symmetrical.  Speech is fluent.  Psychiatric: Judgment intact, Mood & affect appropriate for pt's clinical situation. Dermatologic: No rashes or ulcers noted.  No cellulitis or open wounds. Lymph : No Cervical, Axillary, or Inguinal  lymphadenopathy.      Labs Recent Results (from the past 2160 hour(s))  CBC with Differential     Status: Abnormal   Collection Time: 10/18/16  2:44 PM  Result Value Ref Range   WBC 11.8 (H) 3.8 - 10.6 K/uL   RBC 5.33 4.40 - 5.90 MIL/uL   Hemoglobin 19.1 (H) 13.0 - 18.0 g/dL   HCT 56.0 (H) 40.0 - 52.0 %   MCV 105.0 (H) 80.0 - 100.0 fL   MCH 35.8 (H) 26.0 - 34.0 pg   MCHC 34.1 32.0 - 36.0 g/dL   RDW 14.9 (H) 11.5 - 14.5 %   Platelets 292 150 - 440 K/uL   Neutrophils Relative % 74 %   Neutro Abs 8.8 (H) 1.4 - 6.5 K/uL   Lymphocytes Relative 16 %   Lymphs Abs 1.9 1.0 - 3.6 K/uL   Monocytes Relative  8 %   Monocytes Absolute 0.9 0.2 - 1.0 K/uL   Eosinophils Relative 1 %   Eosinophils Absolute 0.2 0 - 0.7 K/uL   Basophils Relative 1 %   Basophils Absolute 0.1 0 - 0.1 K/uL  Basic metabolic panel     Status: Abnormal   Collection Time: 10/18/16  2:44 PM  Result Value Ref Range   Sodium 133 (L) 135 - 145 mmol/L   Potassium 4.1 3.5 - 5.1 mmol/L   Chloride 98 (L) 101 - 111 mmol/L   CO2 27 22 - 32 mmol/L   Glucose, Bld 92 65 - 99 mg/dL   BUN 11 6 - 20 mg/dL   Creatinine, Ser 1.12 0.61 - 1.24 mg/dL   Calcium 9.1 8.9 - 10.3 mg/dL   GFR calc non Af Amer >60 >60 mL/min   GFR calc Af Amer >60 >60 mL/min    Comment: (NOTE) The eGFR has been calculated using the CKD EPI equation. This calculation has not been validated in all clinical situations. eGFR's persistently <60 mL/min signify possible Chronic Kidney Disease.    Anion gap 8 5 - 15  APTT     Status: None   Collection Time: 10/18/16  2:44 PM  Result Value Ref Range   aPTT 32 24 - 36 seconds  Protime-INR     Status: None   Collection Time: 10/18/16  2:44 PM  Result Value Ref Range   Prothrombin Time 13.8 11.4 - 15.2 seconds   INR 1.06   Antithrombin III     Status: None   Collection Time: 10/18/16  4:34 PM  Result Value Ref Range   AntiThromb III Func 90 75 - 120 %    Comment: Performed at [facility] C activity     Status: Abnormal   Collection Time: 10/18/16  4:34 PM  Result Value Ref Range   Protein C Activity 69 (L) 73 - 180 %    Comment: (NOTE) A deficiency of protein C (PC), either congenital or acquired, increases the risk of thromboembolism. Acquired PC deficiency occurs more frequently than congenital deficiency. PC levels can be transiently diminished after a thrombotic event or surgery or in the presence of certain anticoagulants. Heparin, direct Xa inhibitor, or thrombotic inhibitor therapy does not alter PC levels physiologically and does not interfere with this assay because it is chromogenic and clot-based. Vitamin K antagonist therapy may decrease plasma levels of functional protein C (PC) as PC is a vitamin K- dependent protein. Vitamin K deficiency, due to dietary insufficiency or malabsorption will also lead to reduced PC levels. Acquired deficiency can be found in individuals with disseminated intravascular coagulation (DIC) and sepsis. Severe hepatic disorders (hepatitis, cirrhosis, etc.), nephrotic syndrome, malignancy and inflammatory bowel disease can lead to diminished PC levels. Drug  therapy with L-asparaginse or fluorouracil can also reduce PC levels. Levels may be decreased in patients with polycythemia vera, sickle cell disease and essential thrombocythemia. Repeat evaluation on a new plasma sample to confirm or refute this result should be considered, after ruling out acquired causes, depending on the clinical scenario. Performed At: [facility] [place], [place] [id number] Lindon Romp MD KN:[id number]   Protein C, total     Status: None   Collection Time: 10/18/16  4:34 PM  Result Value Ref Range   Protein C, Total 63 60 - 150 %    Comment: (NOTE) Performed At: [facility] [address] [place], [place] [id number] Darrel Hoover  F MD XN:[id number]   Protein S activity     Status: Abnormal    Collection Time: 10/18/16  4:34 PM  Result Value Ref Range   Protein S Activity 141 (H) 63 - 140 %    Comment: (NOTE) An elevated protein S activity is of no known clinical significance. Protein S activity may be falsely increased (masking an abnormal, low result) in patients receiving direct Xa inhibitor (e.g., rivaroxaban, apixaban, edoxaban) or a direct thrombin inhibitor (e.g., dabigatran) anticoagulant treatment due to assay interference by these drugs. Performed At: [facility] [place], [place] [id number] Lindon Romp MD GY:[id number]   Protein S, total     Status: None   Collection Time: 10/18/16  4:34 PM  Result Value Ref Range   Protein S Ag, Total 91 60 - 150 %    Comment: (NOTE) This test was developed and its performance characteristics determined by [facility]. It has not been cleared or approved by the Food and Drug Administration. Performed At: [facility] [place], [place] [id number] Lindon Romp MD QZ:[id number]   Lupus anticoagulant panel     Status: Abnormal   Collection Time: 10/18/16  4:34 PM  Result Value Ref Range   PTT Lupus Anticoagulant 45.4 0.0 - 51.9 sec    Comment: (NOTE) Additional testing confirms the presence of heparin in the test sample. Results obtained after heparin neutralization.    DRVVT 62.7 (H) 0.0 - 47.0 sec   Lupus Anticoag Interp Comment:     Comment: (NOTE) No lupus anticoagulant was detected. An extended dRVVT that corrects on mixing with normal plasma can be caused by a deficiency of one of the common pathway factors (X, V, II or fibrinogen). Performed At: [facility] [place], [place] [id number] Lindon Romp MD SL:[id number]   Beta-2-glycoprotein i abs, IgG/M/A     Status: None   Collection Time: 10/18/16  4:34 PM  Result Value Ref Range   Beta-2 Glyco I IgG <9 0 - 20 GPI IgG units    Comment: (NOTE) The reference interval reflects a 3SD or  99th percentile interval, which is thought to represent a potentially clinically significant result in accordance with the International Consensus Statement on the classification criteria for definitive antiphospholipid syndrome (APS). J Thromb Haem 2006;4:295-306.    Beta-2-Glycoprotein I IgM <9 0 - 32 GPI IgM units    Comment: (NOTE) The reference interval reflects a 3SD or 99th percentile interval, which is thought to represent a potentially clinically significant result in accordance with the International Consensus Statement on the classification criteria for definitive antiphospholipid syndrome (APS). J Thromb Haem 2006;4:295-306. Performed At: [facility] [place], [place] [id number] Lindon Romp MD DH:[id number]    Beta-2-Glycoprotein I IgA <9 0 - 25 GPI IgA units    Comment: (NOTE) The reference interval reflects a 3SD or 99th percentile interval, which is thought to represent a potentially clinically significant result in accordance with the International Consensus Statement on the classification criteria for definitive antiphospholipid syndrome (APS). J Thromb Haem 2006;4:295-306.   Homocysteine, serum     Status: Abnormal   Collection Time: 10/18/16  4:34 PM  Result Value Ref Range   Homocysteine >250.0 (H) 0.0 - 15.0 umol/L    Comment: (NOTE) Results confirmed on dilution. Performed At: [facility] [place], [place] [id number] Lindon Romp MD QM:[id number]   Factor 5 leiden     Status: None  Collection Time: 10/18/16  4:34 PM  Result Value Ref Range   Recommendations-F5LEID: Comment     Comment: (NOTE) Result:  Negative (no mutation found) Factor V Leiden is a specific mutation (R506Q) in the factor V gene that is associated with an increased risk of venous thrombosis. Factor V Leiden is more resistant to inactivation by activated protein C.  As a result, factor V persists in the circulation leading to a  mild hyper- coagulable state.  The Leiden mutation accounts for 90% - 95% of APC resistance.  Factor V Leiden has been reported in patients with deep vein thrombosis, pulmonary embolus, central retinal vein occlusion, cerebral sinus thrombosis and hepatic vein thrombosis. Other risk factors to be considered in the workup for venous thrombosis include the G20210A mutation in the factor II (prothrombin) gene, protein S and C deficiency, and antithrombin deficiencies. Anticardiolipin antibody and lupus anticoagulant analysis may be appropriate for certain patients, as well as homocysteine levels. Contact your local [facility] for information on how to order additi onal testing if desired.    Comment Comment     Comment: (NOTE) **Genetic counselors are available for health care providers to**  discuss results at [phone] [phone]). Methodology: DNA analysis of the Factor V gene was performed by allele-specific PCR. The diagnostic sensitivity and specificity is >99% for both. Molecular-based testing is highly accurate, but as in any laboratory test, diagnostic errors may occur. All test results must be combined with clinical information for the most accurate interpretation. This test was developed and its performance characteristics determined by [facility]. It has not been cleared or approved by the Food and Drug Administration. References: Voelkerding K (1996).  Clin Lab Med [phone]. Allison Quarry, PhD, [facility] Ruben Reason, PhD, [facility] Jens Som, PhD, [facility] Annetta Maw, M.S., PhD, [facility] Alfredo Bach, PhD, [facility] Norva Riffle, PhD, [facility] Earlean Polka PhD, [facility] Performed At: [facility] [place], [place] [id number] Nelda Marseille EY:[id number]   Prothrombin gene mutation     Status: None   Collection Time: 10/18/16  4:34 PM  Result Value Ref Range   Recommendations-PTGENE: Comment     Comment: (NOTE) NEGATIVE No mutation  identified. Comment: A point mutation (G20210A) in the factor II (prothrombin) gene is the second most common cause of inherited thrombophilia. The incidence of this mutation in the [place] Caucasian population is about 2% and in the [place] American population it is approximately 0.5%. This mutation is rare in the [place] and Native American population. Being heterozygous for a prothrombin mutation increases the risk for developing venous thrombosis about 2 to 3 times above the general population risk. Being homozygous for the prothrombin gene mutation increases the relative risk for venous thrombosis further, although it is not yet known how much further the risk is increased. In women heterozygous for the prothrombin gene mutation, the use of estrogen containing oral contraceptives increases the relative risk of venous thrombosis about 16 times and the risk of developing cerebral thrombosis is also significantly increased. In pregnancy the pr othrombin gene mutation increases risk for venous thrombosis and may increase risk for stillbirth, placental abruption, pre-eclampsia and fetal growth restriction. If the patient possesses two or more congenital or acquired thrombophilic risk factors, the risk for thrombosis may rise to more than the sum of the risk ratios for the individual mutations. This assay detects only the prothrombin G20210A mutation and does not measure genetic abnormalities elsewhere in the genome. Other thrombotic risk factors may  be pursued through systematic clinical laboratory analysis. These factors include the R506Q (Leiden) mutation in the Factor V gene, plasma homocysteine levels, as well as testing for deficiencies of antithrombin III, protein C and protein S.    Additional Information Comment     Comment: (NOTE) Genetic Counselors are available for health care providers to discuss results at [phone] [phone]). Methodology: DNA analysis of the Factor II  gene was performed by PCR amplification followed by restriction analysis. The diagnostic sensitivity is >99% for both. All the tests must be combined with clinical information for the most accurate interpretation. Molecular-based testing is highly accurate, but as in any laboratory test, diagnostic errors may occur. This test was developed and its performance characteristics determined by [facility]. It has not been cleared or approved by the Food and Drug Administration. Poort SR, et al. Blood. 1996; 44:8185-6314. Varga EA. Circulation. 2004; 970:Y63-Z85. Mervin Hack, et [place]; 19:700-703. Allison Quarry, PhD, [facility] Ruben Reason, PhD, [facility] Jens Som, PhD, [facility] Annetta Maw, M.S., PhD, [facility] Alfredo Bach, PhD, [facility] Norva Riffle, PhD, [facility] Earlean Polka,  PhD, [facility] Performed At: [facility] [address] [place], [place] [id number] Nechama Guard MD OI:[id number]   Cardiolipin antibodies, IgG, IgM, IgA     Status: Abnormal   Collection Time: 10/18/16  4:34 PM  Result Value Ref Range   Anticardiolipin IgG <9 0 - 14 GPL U/mL    Comment: (NOTE)                          Negative:              <15                          Indeterminate:     15 - 20                          Low-Med Positive: >20 - 80                          High Positive:         >80    Anticardiolipin IgM 33 (H) 0 - 12 MPL U/mL    Comment: (NOTE)                          Negative:              <13                          Indeterminate:     13 - 20                          Low-Med Positive: >20 - 80                          High Positive:         >80    Anticardiolipin IgA <9 0 - 11 APL U/mL    Comment: (NOTE)                          Negative:              <  12                          Indeterminate:     12 - 20                          Low-Med Positive: >20 - 80                          High Positive:         >80 Performed At: [facility] [place], [place] [id number] Lindon Romp MD GY:[id number]   dRVVT Mix     Status: None   Collection Time: 10/18/16  4:34 PM  Result Value Ref Range   dRVVT Mix 45.3 0.0 - 47.0 sec    Comment: (NOTE) Performed At: [facility] [place], [place] [id number] Lindon Romp MD OY:[id number]   Heparin level (unfractionated)     Status: None   Collection Time: 10/18/16  9:41 PM  Result Value Ref Range   Heparin Unfractionated 0.36 0.30 - 0.70 IU/mL    Comment:        IF HEPARIN RESULTS ARE BELOW EXPECTED VALUES, AND PATIENT DOSAGE HAS BEEN CONFIRMED, SUGGEST FOLLOW UP TESTING OF ANTITHROMBIN III LEVELS.   Basic metabolic panel     Status: Abnormal   Collection Time: 10/19/16  4:37 AM  Result Value Ref Range   Sodium 134 (L) 135 - 145 mmol/L   Potassium 3.5 3.5 - 5.1 mmol/L   Chloride 105 101 - 111 mmol/L   CO2 23 22 - 32 mmol/L   Glucose, Bld 108 (H) 65 - 99 mg/dL   BUN 12 6 - 20 mg/dL   Creatinine, Ser 1.07 0.61 - 1.24 mg/dL   Calcium 7.9 (L) 8.9 - 10.3 mg/dL   GFR calc non Af Amer >60 >60 mL/min   GFR calc Af Amer >60 >60 mL/min    Comment: (NOTE) The eGFR has been calculated using the CKD EPI equation. This calculation has not been validated in all clinical situations. eGFR's persistently <60 mL/min signify possible Chronic Kidney Disease.    Anion gap 6 5 - 15  CBC     Status: Abnormal   Collection Time: 10/19/16  4:37 AM  Result Value Ref Range   WBC 9.4 3.8 - 10.6 K/uL   RBC 4.71 4.40 - 5.90 MIL/uL   Hemoglobin 17.2 13.0 - 18.0 g/dL   HCT 49.6 40.0 - 52.0 %   MCV 105.5 (H) 80.0 - 100.0 fL   MCH 36.5 (H) 26.0 - 34.0 pg   MCHC 34.6 32.0 - 36.0 g/dL   RDW 15.1 (H) 11.5 - 14.5 %   Platelets 269 150 - 440 K/uL  Heparin level (unfractionated)     Status: None   Collection Time: 10/19/16  4:37 AM  Result Value Ref Range   Heparin Unfractionated 0.44 0.30 - 0.70 IU/mL    Comment:        IF HEPARIN RESULTS ARE  BELOW EXPECTED VALUES, AND PATIENT DOSAGE HAS BEEN CONFIRMED, SUGGEST FOLLOW UP TESTING OF ANTITHROMBIN III LEVELS.   Heparin level (unfractionated)     Status: None   Collection Time: 10/20/16  6:28 AM  Result Value Ref Range   Heparin Unfractionated 0.35 0.30 - 0.70 IU/mL    Comment:        IF HEPARIN RESULTS ARE BELOW  EXPECTED VALUES, AND PATIENT DOSAGE HAS BEEN CONFIRMED, SUGGEST FOLLOW UP TESTING OF ANTITHROMBIN III LEVELS.   CBC     Status: Abnormal   Collection Time: 10/20/16  6:28 AM  Result Value Ref Range   WBC 7.6 3.8 - 10.6 K/uL   RBC 4.59 4.40 - 5.90 MIL/uL   Hemoglobin 16.7 13.0 - 18.0 g/dL   HCT 47.6 40.0 - 52.0 %   MCV 103.7 (H) 80.0 - 100.0 fL   MCH 36.3 (H) 26.0 - 34.0 pg   MCHC 35.0 32.0 - 36.0 g/dL   RDW 14.9 (H) 11.5 - 14.5 %   Platelets 268 150 - 440 K/uL  CBC     Status: Abnormal   Collection Time: 10/21/16  6:09 AM  Result Value Ref Range   WBC 7.2 3.8 - 10.6 K/uL   RBC 4.72 4.40 - 5.90 MIL/uL   Hemoglobin 17.4 13.0 - 18.0 g/dL   HCT 49.5 40.0 - 52.0 %   MCV 104.8 (H) 80.0 - 100.0 fL   MCH 36.9 (H) 26.0 - 34.0 pg   MCHC 35.2 32.0 - 36.0 g/dL   RDW 14.9 (H) 11.5 - 14.5 %   Platelets 292 150 - 440 K/uL  Heparin level (unfractionated)     Status: None   Collection Time: 10/21/16  6:09 AM  Result Value Ref Range   Heparin Unfractionated 0.56 0.30 - 0.70 IU/mL    Comment:        IF HEPARIN RESULTS ARE BELOW EXPECTED VALUES, AND PATIENT DOSAGE HAS BEEN CONFIRMED, SUGGEST FOLLOW UP TESTING OF ANTITHROMBIN III LEVELS.   CBC     Status: Abnormal   Collection Time: 10/22/16  4:40 AM  Result Value Ref Range   WBC 7.1 3.8 - 10.6 K/uL   RBC 4.67 4.40 - 5.90 MIL/uL   Hemoglobin 17.2 13.0 - 18.0 g/dL   HCT 48.8 40.0 - 52.0 %   MCV 104.5 (H) 80.0 - 100.0 fL   MCH 36.8 (H) 26.0 - 34.0 pg   MCHC 35.2 32.0 - 36.0 g/dL   RDW 14.5 11.5 - 14.5 %   Platelets 296 150 - 440 K/uL  Heparin level (unfractionated)     Status: None   Collection Time:  10/22/16  4:40 AM  Result Value Ref Range   Heparin Unfractionated 0.37 0.30 - 0.70 IU/mL    Comment:        IF HEPARIN RESULTS ARE BELOW EXPECTED VALUES, AND PATIENT DOSAGE HAS BEEN CONFIRMED, SUGGEST FOLLOW UP TESTING OF ANTITHROMBIN III LEVELS.   Protime-INR     Status: None   Collection Time: 10/22/16  4:40 AM  Result Value Ref Range   Prothrombin Time 14.4 11.4 - 15.2 seconds   INR 5.36   Basic metabolic panel     Status: Abnormal   Collection Time: 10/22/16 12:43 PM  Result Value Ref Range   Sodium 135 135 - 145 mmol/L   Potassium 4.0 3.5 - 5.1 mmol/L   Chloride 101 101 - 111 mmol/L   CO2 24 22 - 32 mmol/L   Glucose, Bld 100 (H) 65 - 99 mg/dL   BUN 7 6 - 20 mg/dL   Creatinine, Ser 1.11 0.61 - 1.24 mg/dL   Calcium 9.1 8.9 - 10.3 mg/dL   GFR calc non Af Amer >60 >60 mL/min   GFR calc Af Amer >60 >60 mL/min    Comment: (NOTE) The eGFR has been calculated using the CKD EPI equation. This calculation has not been validated in  all clinical situations. eGFR's persistently <60 mL/min signify possible Chronic Kidney Disease.    Anion gap 10 5 - 15  CBC     Status: Abnormal   Collection Time: 10/22/16 12:43 PM  Result Value Ref Range   WBC 8.3 3.8 - 10.6 K/uL   RBC 5.23 4.40 - 5.90 MIL/uL   Hemoglobin 19.0 (H) 13.0 - 18.0 g/dL   HCT 54.4 (H) 40.0 - 52.0 %   MCV 104.1 (H) 80.0 - 100.0 fL   MCH 36.3 (H) 26.0 - 34.0 pg   MCHC 34.8 32.0 - 36.0 g/dL   RDW 14.9 (H) 11.5 - 14.5 %   Platelets 284 150 - 440 K/uL  Heparin level (unfractionated)     Status: None   Collection Time: 10/22/16  4:15 PM  Result Value Ref Range   Heparin Unfractionated 0.46 0.30 - 0.70 IU/mL    Comment:        IF HEPARIN RESULTS ARE BELOW EXPECTED VALUES, AND PATIENT DOSAGE HAS BEEN CONFIRMED, SUGGEST FOLLOW UP TESTING OF ANTITHROMBIN III LEVELS.   Heparin level (unfractionated)     Status: None   Collection Time: 10/22/16 10:24 PM  Result Value Ref Range   Heparin Unfractionated 0.56 0.30 -  0.70 IU/mL    Comment:        IF HEPARIN RESULTS ARE BELOW EXPECTED VALUES, AND PATIENT DOSAGE HAS BEEN CONFIRMED, SUGGEST FOLLOW UP TESTING OF ANTITHROMBIN III LEVELS.   CBC     Status: Abnormal   Collection Time: 10/23/16  5:16 AM  Result Value Ref Range   WBC 10.1 3.8 - 10.6 K/uL   RBC 4.84 4.40 - 5.90 MIL/uL   Hemoglobin 17.7 13.0 - 18.0 g/dL   HCT 50.9 40.0 - 52.0 %   MCV 105.3 (H) 80.0 - 100.0 fL   MCH 36.6 (H) 26.0 - 34.0 pg   MCHC 34.7 32.0 - 36.0 g/dL   RDW 14.9 (H) 11.5 - 14.5 %   Platelets 246 150 - 440 K/uL    Radiology Ct Angio Chest Pe W Or Wo Contrast  Result Date: 10/18/2016 CLINICAL DATA:  Shortness of Breath, extensive DVT right lower extremity EXAM: CT ANGIOGRAPHY CHEST WITH CONTRAST TECHNIQUE: Multidetector CT imaging of the chest was performed using the standard protocol during bolus administration of intravenous contrast. Multiplanar CT image reconstructions and MIPs were obtained to evaluate the vascular anatomy. CONTRAST:  75 cc Isovue COMPARISON:  None. FINDINGS: Cardiovascular: Heart size is within normal limits. No pericardial effusion. No aortic aneurysm or aortic dissection. The study is of excellent technical quality. There is pulmonary embolus noted in left lower lobe branch. Thrombus is extending in at least 2 segmental branches in left lower lobe please see axial image 47. There is thrombus also noted in lingular branch axial image 44. Thrombus is noted in 1 of 2 segmental branches in right lower lobe. Findings are consistent with bilateral pulmonary emboli. The right ventricle over left ventricle ratio is 0.99 borderline for right heart strain. Small linear thrombus is noted in right lower lobe arterial branch axial image 142. Mediastinum/Nodes: No mediastinal hematoma or adenopathy. Central airways are patent. No hilar adenopathy. Lungs/Pleura: Images of the lung parenchyma shows no pulmonary edema. Small infiltrate or peripheral infarcted lung noted  superior segment of the left lower lobe axial image 38. Mild atelectasis or infiltrate noted left base posteriorly. Upper Abdomen: Visualized upper abdomen shows no adrenal gland mass. Visualized pancreas spleen and liver is unremarkable. Musculoskeletal: No destructive bony lesions are  noted. Sagittal images of the spine shows mild degenerative changes mid and lower thoracic spine. Review of the MIP images confirms the above findings. IMPRESSION: 1. Bilateral pulmonary emboli as described above. The right ventricle over left ventricle ratio is 0.99 borderline for right heart strain. 2. Small left pleural effusion left base posterior atelectasis or infiltrate. There is small infiltrate or peripheral infarcted lung in superior segment of the left lower lobe posteriorly. 3. No mediastinal hematoma or adenopathy. 4. No pulmonary edema. 5. Mild degenerative changes mid and lower thoracic spine. Critical Value/emergent results were called by telephone at the time of interpretation on 10/18/2016 at 4:47 pm to Dr. Hillary Bow , who verbally acknowledged these results. Electronically Signed   By: Lahoma Crocker M.D.   On: 10/18/2016 16:49   US Venous Img Lower Unilateral Right  Result Date: 10/18/2016 CLINICAL DATA:  Right calf pain and edema for the past 3 weeks. History of smoking. Evaluate for DVT. EXAM: RIGHT LOWER EXTREMITY VENOUS DOPPLER ULTRASOUND TECHNIQUE: Gray-scale sonography with graded compression, as well as color Doppler and duplex ultrasound were performed to evaluate the lower extremity deep venous systems from the level of the common femoral vein and including the common femoral, femoral, profunda femoral, popliteal and calf veins including the posterior tibial, peroneal and gastrocnemius veins when visible. The superficial great saphenous vein was also interrogated. Spectral Doppler was utilized to evaluate flow at rest and with distal augmentation maneuvers in the common femoral, femoral and popliteal  veins. COMPARISON:  None. FINDINGS: Contralateral Common Femoral Vein: Respiratory phasicity is normal and symmetric with the symptomatic side. No evidence of thrombus. Normal compressibility. There is mixed echogenic near occlusive thrombus within the right common femoral vein (images 9 and 10). The saphenofemoral junction appears patent. There is mixed echogenic occlusive thrombus within the imaged portions of the deep femoral vein (image 17) extending from the proximal superficial femoral vein (image 19) through its distal most aspect (image 28), and extending through the right popliteal vein (images 20 and 32) into the imaged portions of the right posterior tibial and peroneal veins. Other Findings:  None. IMPRESSION: Examination is positive for extensive, largely occlusive mixed echogenic thrombus extending from the right common femoral vein through all the imaged venous segments of the right lower extremity. Critical Value/emergent results were called by telephone at the time of interpretation on 10/18/2016 at 2:38 pm to Dr. Corky Downs, who verbally acknowledged these results. Electronically Signed   By: Sandi Mariscal M.D.   On: 10/18/2016 14:39     Assessment/Plan  Acute pulmonary embolism (HCC) Status post IVC filter and lower extremity venous lysis. Continue anticoagulation.  DVT (deep vein thrombosis) in pregnancy [facility]) Continue anticoagulation. Status post venous lysis and IVC filter placement. We will plan to recheck an ultrasound in about 3 months and is long as no acute DVT is present at that time, removal of the IVC filter would be planned.    Leotis Pain, MD  10/29/2016 10:18 AM    This note was created with Dragon medical transcription system.  Any errors from dictation are purely unintentional

## 2016-10-29 NOTE — Assessment & Plan Note (Signed)
Continue anticoagulation. Status post venous lysis and IVC filter placement. We will plan to recheck an ultrasound in about 3 months and is long as no acute DVT is present at that time, removal of the IVC filter would be planned.

## 2016-10-29 NOTE — Assessment & Plan Note (Signed)
Status post IVC filter and lower extremity venous lysis. Continue anticoagulation.

## 2016-11-08 ENCOUNTER — Telehealth (INDEPENDENT_AMBULATORY_CARE_PROVIDER_SITE_OTHER): Payer: Self-pay

## 2016-11-08 NOTE — Telephone Encounter (Signed)
Patient called wanting to know if he has to take the blood thinner he was prescribed for a long time. I explained that if a patient has been diagnosed with a DVT that usually they have to take the blood thinner for at least 6 months.

## 2016-12-10 ENCOUNTER — Emergency Department (HOSPITAL_COMMUNITY)
Admission: EM | Admit: 2016-12-10 | Discharge: 2016-12-10 | Disposition: A | Discharge status: Home / Self Care | Payer: Medicaid Other | Attending: Emergency Medicine | Admitting: Emergency Medicine

## 2016-12-10 DIAGNOSIS — Z79899 Other long term (current) drug therapy: Secondary | ICD-10-CM | POA: Insufficient documentation

## 2016-12-10 DIAGNOSIS — I10 Essential (primary) hypertension: Secondary | ICD-10-CM | POA: Diagnosis not present

## 2016-12-10 DIAGNOSIS — R1031 Right lower quadrant pain: Secondary | ICD-10-CM | POA: Diagnosis present

## 2016-12-10 DIAGNOSIS — F17200 Nicotine dependence, unspecified, uncomplicated: Secondary | ICD-10-CM | POA: Insufficient documentation

## 2016-12-10 DIAGNOSIS — R1032 Left lower quadrant pain: Secondary | ICD-10-CM | POA: Diagnosis not present

## 2016-12-10 LAB — BASIC METABOLIC PANEL
Anion gap: 6 (ref 5–15)
BUN: 13 mg/dL (ref 6–20)
CALCIUM: 9.1 mg/dL (ref 8.9–10.3)
CO2: 28 mmol/L (ref 22–32)
CREATININE: 1.18 mg/dL (ref 0.61–1.24)
Chloride: 100 mmol/L — ABNORMAL LOW (ref 101–111)
GFR calc non Af Amer: >60 mL/min (ref >60)
Glucose, Bld: 91 mg/dL (ref 65–99)
Potassium: 4.5 mmol/L (ref 3.5–5.1)
SODIUM: 134 mmol/L — AB (ref 135–145)

## 2016-12-10 LAB — URINALYSIS, ROUTINE W REFLEX MICROSCOPIC
Bacteria, UA: NONE SEEN
Bilirubin Urine: NEGATIVE
GLUCOSE, UA: NEGATIVE mg/dL
Hgb urine dipstick: NEGATIVE
KETONES UR: NEGATIVE mg/dL
Nitrite: NEGATIVE
PROTEIN: NEGATIVE mg/dL
Specific Gravity, Urine: 1.018 (ref 1.005–1.030)
pH: 6 (ref 5.0–8.0)

## 2016-12-10 LAB — CBC
HCT: 51.1 % (ref 39.0–52.0)
HEMOGLOBIN: 18.2 g/dL — AB (ref 13.0–17.0)
MCH: 35.9 pg — ABNORMAL HIGH (ref 26.0–34.0)
MCHC: 35.6 g/dL (ref 30.0–36.0)
MCV: 100.8 fL — ABNORMAL HIGH (ref 78.0–100.0)
Platelets: 240 10*3/uL (ref 150–400)
RBC: 5.07 MIL/uL (ref 4.22–5.81)
RDW: 13.7 % (ref 11.5–15.5)
WBC: 8 10*3/uL (ref 4.0–10.5)

## 2016-12-10 MED ORDER — IBUPROFEN 800 MG PO TABS
800.0000 mg | ORAL_TABLET | Freq: Once | ORAL | Status: AC
  Administered 2016-12-10: 800 mg via ORAL
  Filled 2016-12-10: qty 1

## 2016-12-10 MED ORDER — IBUPROFEN 600 MG PO TABS: 600.0000 mg | ORAL_TABLET | Freq: Four times a day (QID) | ORAL | 0 refills | Status: DC | PRN

## 2016-12-10 NOTE — ED Provider Notes (Signed)
[place] DEPT Provider Note   CSN: [id number] Arrival date & time: 12/10/16  [id number]  By signing my name below, I, Lance Cohen, attest that this documentation has been prepared under the direction and in the presence of Lance Chapel, MD. Electronically Signed: Collene Cohen, Scribe. 12/10/16. 11:07 AM.   History   Chief Complaint Chief Complaint  Patient presents with  . Groin Pain    HPI Comments: Lance Cohen is a 46 y.o. male with a hx of bipolar and an acute pulmonary embolism, who presents to the Emergency Department complaining of a sudden-onset, constant right groin pain that began a few days ago. Patient reports having an IVC filter placed on January 1st for a blood clot from the right knee up to his arm. Patient is on eliquis twice a day, patient has been taking his medication regularly. Patient reports going ot his PCP for this problem, in which he was given medication that made him "ill-tempered". Patient has an appointment at the health department in two days. Patient has a scheduled appointment on the 28th for his blood clot. Patient is a tobacco user, but denies alcohol use. Patient reports taking ibuprofen for pain. Patient denies any other symptoms.   The history is provided by the patient. No language interpreter was used.    Past Medical History:  Diagnosis Date  . Asthma   . Bipolar 1 disorder ([place])   . Chronic knee pain   . Hypertension   . Suicide attempt    2010 Intentional overdose attempt with Depakote.    Patient Active Problem List   Diagnosis Date Noted  . Acute pulmonary embolism (Lance Cohen) 10/23/2016  . Nausea and vomiting 10/23/2016  . S/P IVC filter 10/23/2016  . Tobacco abuse counseling 10/23/2016  . DVT (deep vein thrombosis) in pregnancy ([place]) 10/18/2016  . Cellulitis of foot 06/24/2011  . Bipolar 1 disorder ([place]) 06/24/2011  . Tobacco abuse 06/24/2011  . Polycythemia 06/24/2011  . Tinea pedis 06/24/2011  . CARPAL TUNNEL SYNDROME, RIGHT  06/21/2010  . CONTUSION, LEFT FOOT 06/05/2010  . FOREIGN BODY, FOOT 04/17/2010    Past Surgical History:  Procedure Laterality Date  . FOOT SURGERY     had peice of wire removed  . PERIPHERAL VASCULAR CATHETERIZATION Right 10/22/2016   Procedure: Thrombectomy, thrombolyisis;  Surgeon: [name], MD;  Location: [place] [facility];  Service: Cardiovascular;  Laterality: Right;  . PERIPHERAL VASCULAR CATHETERIZATION N/A 10/22/2016   Procedure: IVC Filter Insertion;  Surgeon: [name], MD;  Location: [place] [facility];  Service: Cardiovascular;  Laterality: N/A;       Home Medications    Prior to Admission medications   Medication Sig Start Date End Date Taking? Authorizing Provider  apixaban (ELIQUIS) 5 MG TABS tablet Take 1 tablet (5 mg total) by mouth 2 (two) times daily. 10/30/16  Yes Theodoro Grist, MD  docusate sodium (COLACE) 100 MG capsule Take 1 capsule (100 mg total) by mouth 2 (two) times daily. 10/23/16  Yes Theodoro Grist, MD  gabapentin (NEURONTIN) 300 MG capsule Take 300 mg by mouth 4 (four) times daily.   Yes Historical Provider, MD  OLANZapine (ZYPREXA) 20 MG tablet Take 20 mg by mouth at bedtime.   Yes Historical Provider, MD  polyethylene glycol (MIRALAX / GLYCOLAX) packet Take 17 g by mouth daily as needed for mild constipation. 10/23/16  Yes Theodoro Grist, MD  ibuprofen (ADVIL,MOTRIN) 600 MG tablet Take 1 tablet (600 mg total) by mouth every 6 (six)  hours as needed. 12/10/16   Lance Chapel, MD    Family History Family History  Problem Relation Age of Onset  . Cancer Mother   . Heart attack Father     Social History Social History  Substance Use Topics  . Smoking status: Current Every Day Smoker    Packs/day: 0.50  . Smokeless tobacco: Never Used  . Alcohol use Yes     Comment: occasionally     Allergies   Bee venom; Penicillins; and Sulfa antibiotics   Review of Systems Review of Systems  Musculoskeletal: Positive for arthralgias (right  groin).  All other systems reviewed and are negative.    Physical Exam Updated Vital Signs BP 129/98 (BP Location: Left Arm)   Pulse 73   Temp 97.9 F (36.6 C) (Oral)   Resp 16   Ht 5\' 11"  (1.803 m)   Wt 240 lb (108.9 kg)   SpO2 100%   BMI 33.47 kg/m   Physical Exam  Constitutional: He is oriented to person, place, and time. He appears well-developed.  HENT:  Head: Normocephalic and atraumatic.  Mouth/Throat: Oropharynx is clear and moist.  Eyes: Conjunctivae and EOM are normal. Pupils are equal, round, and reactive to light.  Neck: Normal range of motion. Neck supple.  Cardiovascular: Normal rate, regular rhythm and normal heart sounds.  Exam reveals no gallop and no friction rub.   No murmur heard. Pulmonary/Chest: Effort normal and breath sounds normal. No respiratory distress. He has no wheezes.  Abdominal: Soft. Bowel sounds are normal. He exhibits no distension. There is no tenderness.  Musculoskeletal: He exhibits no edema.  Strong DP pulses bilaterally. Minimal pain with leg forced into flexion of the hip. Tenderness in the left inguinal region without masses or bleeding.   Neurological: He is alert and oriented to person, place, and time.  Skin: Skin is warm and dry.  Psychiatric: He has a normal mood and affect.     ED Treatments / Results  DIAGNOSTIC STUDIES: Oxygen Saturation is 100% on RA, normal by my interpretation.    COORDINATION OF CARE: 11:06 AM Discussed treatment plan with pt at bedside and pt agreed to plan, which includes imaging.   Labs (all labs ordered are listed, but only abnormal results are displayed) Labs Reviewed  URINALYSIS, ROUTINE W REFLEX MICROSCOPIC - Abnormal; Notable for the following:       Result Value   APPearance HAZY (*)    Leukocytes, UA MODERATE (*)    All other components within normal limits  CBC - Abnormal; Notable for the following:    Hemoglobin 18.2 (*)    MCV 100.8 (*)    MCH 35.9 (*)    All other components  within normal limits  BASIC METABOLIC PANEL - Abnormal; Notable for the following:    Sodium 134 (*)    Chloride 100 (*)    All other components within normal limits  URINE CULTURE    Radiology No results found.  Procedures Procedures (including critical care time)  Medications Ordered in ED Medications  ibuprofen (ADVIL,MOTRIN) tablet 800 mg (800 mg Oral Given 12/10/16 1114)     Initial Impression / Assessment and Plan / ED Course  I have reviewed the triage vital signs and the nursing notes.  Pertinent labs & imaging results that were available during my care of the patient were reviewed by me and considered in my medical decision making (see chart for details).     No skin changes of concern No masses  No active bleeding or swelling Hgb baseline Chronic pain - states worse since ran out of oxy7.5 Home on motrin  Final Clinical Impressions(s) / ED Diagnoses   Final diagnoses:  Groin pain, left    New Prescriptions New Prescriptions   IBUPROFEN (ADVIL,MOTRIN) 600 MG TABLET    Take 1 tablet (600 mg total) by mouth every 6 (six) hours as needed.   I personally performed the services described in this documentation, which was scribed in my presence. The recorded information has been reviewed and is accurate.        Lance Chapel, MD 12/10/16 [phone]

## 2016-12-10 NOTE — Discharge Instructions (Signed)

## 2016-12-10 NOTE — ED Triage Notes (Signed)
Pt reports had IVC filter placed in right groin for blood clot in right thigh on January 1st. Pt reports right groin pain ever since. Pt reports was given pain medication prescription and reports once medication ran out pain has persisted. Pt denies any known injury, weakness, numbness in RLE.

## 2016-12-12 LAB — URINE CULTURE

## 2016-12-18 ENCOUNTER — Ambulatory Visit (INDEPENDENT_AMBULATORY_CARE_PROVIDER_SITE_OTHER): Payer: Medicaid Other | Admitting: Vascular Surgery

## 2016-12-18 ENCOUNTER — Ambulatory Visit (INDEPENDENT_AMBULATORY_CARE_PROVIDER_SITE_OTHER): Payer: Medicaid Other

## 2016-12-18 DIAGNOSIS — O2230 Deep phlebothrombosis in pregnancy, unspecified trimester: Secondary | ICD-10-CM

## 2016-12-18 DIAGNOSIS — I82409 Acute embolism and thrombosis of unspecified deep veins of unspecified lower extremity: Secondary | ICD-10-CM

## 2016-12-24 ENCOUNTER — Ambulatory Visit (INDEPENDENT_AMBULATORY_CARE_PROVIDER_SITE_OTHER): Payer: Medicaid Other | Admitting: Vascular Surgery

## 2016-12-27 ENCOUNTER — Ambulatory Visit (INDEPENDENT_AMBULATORY_CARE_PROVIDER_SITE_OTHER): Payer: Medicaid Other | Admitting: Vascular Surgery

## 2016-12-27 ENCOUNTER — Encounter (INDEPENDENT_AMBULATORY_CARE_PROVIDER_SITE_OTHER): Payer: Self-pay | Admitting: Vascular Surgery

## 2016-12-27 VITALS — BP 129/77 | HR 77 | Resp 17 | Wt 243.0 lb

## 2016-12-27 DIAGNOSIS — M79609 Pain in unspecified limb: Secondary | ICD-10-CM | POA: Insufficient documentation

## 2016-12-27 DIAGNOSIS — M79604 Pain in right leg: Secondary | ICD-10-CM | POA: Diagnosis not present

## 2016-12-27 DIAGNOSIS — I2699 Other pulmonary embolism without acute cor pulmonale: Secondary | ICD-10-CM | POA: Diagnosis not present

## 2016-12-27 DIAGNOSIS — I82411 Acute embolism and thrombosis of right femoral vein: Secondary | ICD-10-CM

## 2016-12-27 NOTE — Assessment & Plan Note (Signed)
The patient now has partially occlusive thrombus in the right popliteal vein up through the femoral vein. This is of uncertain chronicity, it is likely subacute to becoming chronic. He has been getting treatment for a little over 2 months now. I think we will hold on removing his filter for another couple of months. Continue anticoagulation. Return to clinic in about 6-8 weeks in follow-up to discuss filter removal.

## 2016-12-27 NOTE — Progress Notes (Signed)
MRN : [id number]  Lance Cohen is a 46 y.o. (1971/09/17) male who presents with chief complaint of  Chief Complaint  Patient presents with  . Follow-up  .  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 He is still having some pain and swelling in that right leg. He has not been wearing compression stockings as he did not know he was supposed to. He has been on anticoagulation. He has no new chest pain or shortness of breath. 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 This is unclear and its chronicity, but is likely subacute to chronic.         Current Outpatient Prescriptions  Medication Sig Dispense Refill  . apixaban (ELIQUIS) 5 MG TABS tablet Take 2 tablets (10 mg total) by mouth 2 (two) times daily. 28 tablet 0  . [START ON 10/30/2016] apixaban (ELIQUIS) 5 MG TABS tablet Take 1 tablet (5 mg total) by mouth 2 (two) times daily. 60 tablet 6  . docusate sodium (COLACE) 100 MG capsule Take 1 capsule (100 mg total) by mouth 2 (two) times daily. 10 capsule 0  . ibuprofen (ADVIL,MOTRIN) 800 MG tablet Take 1 tablet (800 mg total) by mouth 3 (three) times daily. 21 tablet 0  . nicotine (NICODERM CQ - DOSED IN MG/24 HOURS) 14 mg/24hr patch Place 1 patch (14 mg total) onto the skin daily. 28 patch 0  . oxyCODONE-acetaminophen (PERCOCET) 7.5-325 MG tablet Take 1 tablet by mouth every 4 (four) hours as needed for severe pain. 30 tablet 0  . polyethylene glycol (MIRALAX / GLYCOLAX) packet Take 17 g by mouth daily as needed for mild constipation. 14 each 0   No current facility-administered medications for this visit.         Past Medical History:  Diagnosis Date  . Asthma   . Bipolar 1 disorder ([place])   . Chronic knee pain   . Hypertension   . Suicide attempt    2010 Intentional overdose attempt with Depakote.         Past Surgical History:  Procedure Laterality Date  .  FOOT SURGERY     had peice of wire removed  . PERIPHERAL VASCULAR CATHETERIZATION Right 10/22/2016   Procedure: Thrombectomy, thrombolyisis;  Surgeon: [name], MD;  Location: [facility];  Service: Cardiovascular;  Laterality: Right;  . PERIPHERAL VASCULAR CATHETERIZATION N/A 10/22/2016   Procedure: IVC Filter Insertion;  Surgeon: [name], MD;  Location: [facility];  Service: Cardiovascular;  Laterality: N/A;    Social History        Social History  Substance Use Topics  . Smoking status: Current Every Day Smoker    Packs/day: 0.50  . Smokeless tobacco: Never Used  . Alcohol use Yes      Comment: occasionally    Family History Family History  Problem Relation Age of Onset  . Cancer Mother   . Heart attack Father          Allergies  Allergen Reactions  . Bee Venom Swelling  . Penicillins Other (See Comments)    PT states that he had symptoms that felt like he was having a heart attack Has patient had a PCN reaction causing immediate rash, facial/tongue/throat swelling, SOB or lightheadedness with hypotension: No Has patient had a PCN reaction causing severe rash involving mucus membranes or skin necrosis: No Has patient had a  PCN reaction that required hospitalization No Has patient had a PCN reaction occurring within the last 10 years: No If all of the above answers are "NO", then may proceed with Cephal  . Sulfa Antibiotics Itching     REVIEW OF SYSTEMS (Negative unless checked)  Constitutional: [] Weight loss  [] Fever  [] Chills Cardiac: [] Chest pain   [] Chest pressure   [] Palpitations   [] Shortness of breath when laying flat   [] Shortness of breath at rest   [] Shortness of breath with exertion. Vascular:  [] Pain in legs with walking   [] Pain in legs at rest   [] Pain in legs when laying flat   [] Claudication   [] Pain in feet when walking  [] Pain in feet at rest  [] Pain in feet when laying flat   [x] History of DVT    [x] Phlebitis   [x] Swelling in legs   [] Varicose veins   [] Non-healing ulcers Pulmonary:   [] Uses home oxygen   [] Productive cough   [] Hemoptysis   [] Wheeze  [] COPD   [] Asthma Neurologic:  [] Dizziness  [] Blackouts   [] Seizures   [] History of stroke   [] History of TIA  [] Aphasia   [] Temporary blindness   [] Dysphagia   [] Weakness or numbness in arms   [] Weakness or numbness in legs Musculoskeletal:  [] Arthritis   [] Joint swelling   [] Joint pain   [] Low back pain Hematologic:  [] Easy bruising  [] Easy bleeding   [] Hypercoagulable state   [] Anemic   Gastrointestinal:  [] Blood in stool   [] Vomiting blood  [] Gastroesophageal reflux/heartburn   [] Abdominal pain Genitourinary:  [] Chronic kidney disease   [] Difficult urination  [] Frequent urination  [] Burning with urination   [] Hematuria Skin:  [] Rashes   [] Ulcers   [] Wounds Psychological:  [] History of anxiety   []  History of major depression.  Physical Examination  BP 129/81   Pulse 74   Resp 17   Ht 5' 11"  (1.803 m)   Wt 237 lb (107.5 kg)   BMI 33.05 kg/m  Gen:  WD/WN, NAD Head: [place]/AT, No temporalis wasting. Ear/Nose/Throat: Hearing grossly intact, nares w/o erythema or drainage, trachea midline. Dentition very poor. Eyes: Conjunctiva clear. Sclera non-icteric Neck: Supple.  No JVD.  Pulmonary:  Good air movement, no use of accessory muscles.  Cardiac: RRR, normal S1, S2 Vascular:  Vessel Right Left  Radial Palpable Palpable  Ulnar Palpable Palpable  Brachial Palpable Palpable  Carotid Palpable, without bruit Palpable, without bruit  Aorta Not palpable N/A  Femoral Palpable Palpable  Popliteal Palpable Palpable  PT Palpable Palpable  DP Palpable Palpable   Gastrointestinal: soft, non-tender/non-distended. No guarding/reflex.  Musculoskeletal: M/S 5/5 throughout.  No deformity or atrophy. 1+ RLE edema. Neurologic: Sensation grossly intact in extremities.  Symmetrical.  Speech is fluent.  Psychiatric: Judgment intact, Mood & affect  appropriate for pt's clinical situation. Dermatologic: No rashes or ulcers noted.  No cellulitis or open wounds. Lymph : No Cervical, Axillary, or Inguinal lymphadenopathy.      Labs Recent Results (from the past 2160 hour(s))  CBC with Differential     Status: Abnormal   Collection Time: 10/18/16  2:44 PM  Result Value Ref Range   WBC 11.8 (H) 3.8 - 10.6 K/uL   RBC 5.33 4.40 - 5.90 MIL/uL   Hemoglobin 19.1 (H) 13.0 - 18.0 g/dL   HCT 56.0 (H) 40.0 - 52.0 %   MCV 105.0 (H) 80.0 - 100.0 fL   MCH 35.8 (H) 26.0 - 34.0 pg   MCHC 34.1 32.0 - 36.0 g/dL   RDW 14.9 (  H) 11.5 - 14.5 %   Platelets 292 150 - 440 K/uL   Neutrophils Relative % 74 %   Neutro Abs 8.8 (H) 1.4 - 6.5 K/uL   Lymphocytes Relative 16 %   Lymphs Abs 1.9 1.0 - 3.6 K/uL   Monocytes Relative 8 %   Monocytes Absolute 0.9 0.2 - 1.0 K/uL   Eosinophils Relative 1 %   Eosinophils Absolute 0.2 0 - 0.7 K/uL   Basophils Relative 1 %   Basophils Absolute 0.1 0 - 0.1 K/uL  Basic metabolic panel     Status: Abnormal   Collection Time: 10/18/16  2:44 PM  Result Value Ref Range   Sodium 133 (L) 135 - 145 mmol/L   Potassium 4.1 3.5 - 5.1 mmol/L   Chloride 98 (L) 101 - 111 mmol/L   CO2 27 22 - 32 mmol/L   Glucose, Bld 92 65 - 99 mg/dL   BUN 11 6 - 20 mg/dL   Creatinine, Ser 1.12 0.61 - 1.24 mg/dL   Calcium 9.1 8.9 - 10.3 mg/dL   GFR calc non Af Amer >60 >60 mL/min   GFR calc Af Amer >60 >60 mL/min    Comment: (NOTE) The eGFR has been calculated using the CKD EPI equation. This calculation has not been validated in all clinical situations. eGFR's persistently <60 mL/min signify possible Chronic Kidney Disease.    Anion gap 8 5 - 15  APTT     Status: None   Collection Time: 10/18/16  2:44 PM  Result Value Ref Range   aPTT 32 24 - 36 seconds  Protime-INR     Status: None   Collection Time: 10/18/16  2:44 PM  Result Value Ref Range   Prothrombin Time 13.8 11.4 - 15.2 seconds   INR 1.06   Antithrombin III     Status:  None   Collection Time: 10/18/16  4:34 PM  Result Value Ref Range   AntiThromb III Func 90 75 - 120 %    Comment: Performed at [facility] C activity     Status: Abnormal   Collection Time: 10/18/16  4:34 PM  Result Value Ref Range   Protein C Activity 69 (L) 73 - 180 %    Comment: (NOTE) A deficiency of protein C (PC), either congenital or acquired, increases the risk of thromboembolism. Acquired PC deficiency occurs more frequently than congenital deficiency. PC levels can be transiently diminished after a thrombotic event or surgery or in the presence of certain anticoagulants. Heparin, direct Xa inhibitor, or thrombotic inhibitor therapy does not alter PC levels physiologically and does not interfere with this assay because it is chromogenic and clot-based. Vitamin K antagonist therapy may decrease plasma levels of functional protein C (PC) as PC is a vitamin K- dependent protein. Vitamin K deficiency, due to dietary insufficiency or malabsorption will also lead to reduced PC levels. Acquired deficiency can be found in individuals with disseminated intravascular coagulation (DIC) and sepsis. Severe hepatic disorders (hepatitis, cirrhosis, etc.), nephrotic syndrome, malignancy and inflammatory bowel disease can lead to diminished PC levels. Drug  therapy with L-asparaginse or fluorouracil can also reduce PC levels. Levels may be decreased in patients with polycythemia vera, sickle cell disease and essential thrombocythemia. Repeat evaluation on a new plasma sample to confirm or refute this result should be considered, after ruling out acquired causes, depending on the clinical scenario. Performed At: [facility] [place], [place] [id number] Lindon Romp MD [id number]   Protein  C, total     Status: None   Collection Time: 10/18/16  4:34 PM  Result Value Ref Range   Protein C, Total 63 60 - 150 %    Comment: (NOTE) Performed At:  [facility] [place], [place] [id number] Lindon Romp MD [id number]   Protein S activity     Status: Abnormal   Collection Time: 10/18/16  4:34 PM  Result Value Ref Range   Protein S Activity 141 (H) 63 - 140 %    Comment: (NOTE) An elevated protein S activity is of no known clinical significance. Protein S activity may be falsely increased (masking an abnormal, low result) in patients receiving direct Xa inhibitor (e.g., rivaroxaban, apixaban, edoxaban) or a direct thrombin inhibitor (e.g., dabigatran) anticoagulant treatment due to assay interference by these drugs. Performed At: [facility] [place], [place] [id number] Lindon Romp MD [id number]:[id number]   Protein S, total     Status: None   Collection Time: 10/18/16  4:34 PM  Result Value Ref Range   Protein S Ag, Total 91 60 - 150 %    Comment: (NOTE) This test was developed and its performance characteristics determined by [facility]. It has not been cleared or approved by the Food and Drug Administration. Performed At: [facility] [place], [place] [id number] Lindon Romp MD IR:[id number]   Lupus anticoagulant panel     Status: Abnormal   Collection Time: 10/18/16  4:34 PM  Result Value Ref Range   PTT Lupus Anticoagulant 45.4 0.0 - 51.9 sec    Comment: (NOTE) Additional testing confirms the presence of heparin in the test sample. Results obtained after heparin neutralization.    DRVVT 62.7 (H) 0.0 - 47.0 sec   Lupus Anticoag Interp Comment:     Comment: (NOTE) No lupus anticoagulant was detected. An extended dRVVT that corrects on mixing with normal plasma can be caused by a deficiency of one of the common pathway factors (X, V, II or fibrinogen). Performed At: [facility] [place], [place] [id number] Lindon Romp MD AT:[id number]   Beta-2-glycoprotein i abs, IgG/M/A     Status: None   Collection Time:  10/18/16  4:34 PM  Result Value Ref Range   Beta-2 Glyco I IgG <9 0 - 20 GPI IgG units    Comment: (NOTE) The reference interval reflects a 3SD or 99th percentile interval, which is thought to represent a potentially clinically significant result in accordance with the International Consensus Statement on the classification criteria for definitive antiphospholipid syndrome (APS). J Thromb Haem 2006;4:295-306.    Beta-2-Glycoprotein I IgM <9 0 - 32 GPI IgM units    Comment: (NOTE) The reference interval reflects a 3SD or 99th percentile interval, which is thought to represent a potentially clinically significant result in accordance with the International Consensus Statement on the classification criteria for definitive antiphospholipid syndrome (APS). J Thromb Haem 2006;4:295-306. Performed At: [facility] [place], [place] [id number] Lindon Romp MD [id number]    Beta-2-Glycoprotein I IgA <9 0 - 25 GPI IgA units    Comment: (NOTE) The reference interval reflects a 3SD or 99th percentile interval, which is thought to represent a potentially clinically significant result in accordance with the International Consensus Statement on the classification criteria for definitive antiphospholipid syndrome (APS). J Thromb Haem 2006;4:295-306.   Homocysteine, serum     Status: Abnormal   Collection Time: 10/18/16  4:34 PM  Result  Value Ref Range   Homocysteine >250.0 (H) 0.0 - 15.0 umol/L    Comment: (NOTE) Results confirmed on dilution. Performed At: [facility] [place], [place] [id number] Lindon Romp MD [id number]   Factor 5 leiden     Status: None   Collection Time: 10/18/16  4:34 PM  Result Value Ref Range   Recommendations-F5LEID: Comment     Comment: (NOTE) Result:  Negative (no mutation found) Factor V Leiden is a specific mutation (R506Q) in the factor V gene that is associated with an increased risk of  venous thrombosis. Factor V Leiden is more resistant to inactivation by activated protein C.  As a result, factor V persists in the circulation leading to a mild hyper- coagulable state.  The Leiden mutation accounts for 90% - 95% of APC resistance.  Factor V Leiden has been reported in patients with deep vein thrombosis, pulmonary embolus, central retinal vein occlusion, cerebral sinus thrombosis and hepatic vein thrombosis. Other risk factors to be considered in the workup for venous thrombosis include the G20210A mutation in the factor II (prothrombin) gene, protein S and C deficiency, and antithrombin deficiencies. Anticardiolipin antibody and lupus anticoagulant analysis may be appropriate for certain patients, as well as homocysteine levels. Contact your local [facility] for information on how to order additi onal testing if desired.    Comment Comment     Comment: (NOTE) **Genetic counselors are available for health care providers to**  discuss results at [phone]). Methodology: DNA analysis of the Factor V gene was performed by allele-specific PCR. The diagnostic sensitivity and specificity is >99% for both. Molecular-based testing is highly accurate, but as in any laboratory test, diagnostic errors may occur. All test results must be combined with clinical information for the most accurate interpretation. This test was developed and its performance characteristics determined by [facility]. It has not been cleared or approved by the Food and Drug Administration. References: Voelkerding K (1996).  Clin Lab Med [phone]. Allison Quarry, PhD, [facility] Ruben Reason, PhD, [facility] Jens Som, PhD, [facility] Annetta Maw, M.S., PhD, [facility] Alfredo Bach, PhD, [facility] Norva Riffle, PhD, [facility] Earlean Polka PhD, [facility] Performed At: [facility] [place], [place] [id number] Nelda Marseille JQ:[id number]   Prothrombin gene mutation      Status: None   Collection Time: 10/18/16  4:34 PM  Result Value Ref Range   Recommendations-PTGENE: Comment     Comment: (NOTE) NEGATIVE No mutation identified. Comment: A point mutation (G20210A) in the factor II (prothrombin) gene is the second most common cause of inherited thrombophilia. The incidence of this mutation in the [place] Caucasian population is about 2% and in the [place] American population it is approximately 0.5%. This mutation is rare in the [place] and Native American population. Being heterozygous for a prothrombin mutation increases the risk for developing venous thrombosis about 2 to 3 times above the general population risk. Being homozygous for the prothrombin gene mutation increases the relative risk for venous thrombosis further, although it is not yet known how much further the risk is increased. In women heterozygous for the prothrombin gene mutation, the use of estrogen containing oral contraceptives increases the relative risk of venous thrombosis about 16 times and the risk of developing cerebral thrombosis is also significantly increased. In pregnancy the pr othrombin gene mutation increases risk for venous thrombosis and may increase risk for stillbirth, placental abruption, pre-eclampsia and fetal growth restriction. If the patient possesses two  or more congenital or acquired thrombophilic risk factors, the risk for thrombosis may rise to more than the sum of the risk ratios for the individual mutations. This assay detects only the prothrombin G20210A mutation and does not measure genetic abnormalities elsewhere in the genome. Other thrombotic risk factors may be pursued through systematic clinical laboratory analysis. These factors include the R506Q (Leiden) mutation in the Factor V gene, plasma homocysteine levels, as well as testing for deficiencies of antithrombin III, protein C and protein S.    Additional Information Comment     Comment:  (NOTE) Genetic Counselors are available for health care providers to discuss results at [phone]). Methodology: DNA analysis of the Factor II gene was performed by PCR amplification followed by restriction analysis. The diagnostic sensitivity is >99% for both. All the tests must be combined with clinical information for the most accurate interpretation. Molecular-based testing is highly accurate, but as in any laboratory test, diagnostic errors may occur. This test was developed and its performance characteristics determined by [facility]. It has not been cleared or approved by the Food and Drug Administration. Poort SR, et al. Blood. 1996; 01:4103-0131. Varga EA. Circulation. 2004; 438:O87-N79. Mervin Hack, et [place]; 19:700-703. Allison Quarry, PhD, [facility] Ruben Reason, PhD, [facility] Jens Som, PhD, [facility] Annetta Maw, M.S., PhD, [facility] Alfredo Bach, PhD, [facility] Norva Riffle, PhD, [facility] Earlean Polka,  PhD, [facility] Performed At: [facility] [address] [place], [place] [id number] Nechama Guard MD [id number]   Cardiolipin antibodies, IgG, IgM, IgA     Status: Abnormal   Collection Time: 10/18/16  4:34 PM  Result Value Ref Range   Anticardiolipin IgG <9 0 - 14 GPL U/mL    Comment: (NOTE)                          Negative:              <15                          Indeterminate:     15 - 20                          Low-Med Positive: >20 - 80                          High Positive:         >80    Anticardiolipin IgM 33 (H) 0 - 12 MPL U/mL    Comment: (NOTE)                          Negative:              <13                          Indeterminate:     13 - 20                          Low-Med Positive: >20 - 80                          High Positive:         >80  Anticardiolipin IgA <9 0 - 11 APL U/mL    Comment: (NOTE)                          Negative:              <12                           Indeterminate:     12 - 20                          Low-Med Positive: >20 - 80                          High Positive:         >80 Performed At: [facility] [place], [place] [id number] Lindon Romp MD [id number]   dRVVT Mix     Status: None   Collection Time: 10/18/16  4:34 PM  Result Value Ref Range   dRVVT Mix 45.3 0.0 - 47.0 sec    Comment: (NOTE) Performed At: [facility] [place], [place] [id number] Lindon Romp MD [id number]   Heparin level (unfractionated)     Status: None   Collection Time: 10/18/16  9:41 PM  Result Value Ref Range   Heparin Unfractionated 0.36 0.30 - 0.70 IU/mL    Comment:        IF HEPARIN RESULTS ARE BELOW EXPECTED VALUES, AND PATIENT DOSAGE HAS BEEN CONFIRMED, SUGGEST FOLLOW UP TESTING OF ANTITHROMBIN III LEVELS.   Basic metabolic panel     Status: Abnormal   Collection Time: 10/19/16  4:37 AM  Result Value Ref Range   Sodium 134 (L) 135 - 145 mmol/L   Potassium 3.5 3.5 - 5.1 mmol/L   Chloride 105 101 - 111 mmol/L   CO2 23 22 - 32 mmol/L   Glucose, Bld 108 (H) 65 - 99 mg/dL   BUN 12 6 - 20 mg/dL   Creatinine, Ser 1.07 0.61 - 1.24 mg/dL   Calcium 7.9 (L) 8.9 - 10.3 mg/dL   GFR calc non Af Amer >60 >60 mL/min   GFR calc Af Amer >60 >60 mL/min    Comment: (NOTE) The eGFR has been calculated using the CKD EPI equation. This calculation has not been validated in all clinical situations. eGFR's persistently <60 mL/min signify possible Chronic Kidney Disease.    Anion gap 6 5 - 15  CBC     Status: Abnormal   Collection Time: 10/19/16  4:37 AM  Result Value Ref Range   WBC 9.4 3.8 - 10.6 K/uL   RBC 4.71 4.40 - 5.90 MIL/uL   Hemoglobin 17.2 13.0 - 18.0 g/dL   HCT 49.6 40.0 - 52.0 %   MCV 105.5 (H) 80.0 - 100.0 fL   MCH 36.5 (H) 26.0 - 34.0 pg   MCHC 34.6 32.0 - 36.0 g/dL   RDW 15.1 (H) 11.5 - 14.5 %   Platelets 269 150 - 440 K/uL  Heparin level (unfractionated)     Status: None     Collection Time: 10/19/16  4:37 AM  Result Value Ref Range   Heparin Unfractionated 0.44 0.30 - 0.70 IU/mL    Comment:        IF HEPARIN RESULTS ARE BELOW EXPECTED VALUES, AND PATIENT DOSAGE HAS BEEN CONFIRMED, SUGGEST FOLLOW UP TESTING  OF ANTITHROMBIN III LEVELS.   Heparin level (unfractionated)     Status: None   Collection Time: 10/20/16  6:28 AM  Result Value Ref Range   Heparin Unfractionated 0.35 0.30 - 0.70 IU/mL    Comment:        IF HEPARIN RESULTS ARE BELOW EXPECTED VALUES, AND PATIENT DOSAGE HAS BEEN CONFIRMED, SUGGEST FOLLOW UP TESTING OF ANTITHROMBIN III LEVELS.   CBC     Status: Abnormal   Collection Time: 10/20/16  6:28 AM  Result Value Ref Range   WBC 7.6 3.8 - 10.6 K/uL   RBC 4.59 4.40 - 5.90 MIL/uL   Hemoglobin 16.7 13.0 - 18.0 g/dL   HCT 47.6 40.0 - 52.0 %   MCV 103.7 (H) 80.0 - 100.0 fL   MCH 36.3 (H) 26.0 - 34.0 pg   MCHC 35.0 32.0 - 36.0 g/dL   RDW 14.9 (H) 11.5 - 14.5 %   Platelets 268 150 - 440 K/uL  CBC     Status: Abnormal   Collection Time: 10/21/16  6:09 AM  Result Value Ref Range   WBC 7.2 3.8 - 10.6 K/uL   RBC 4.72 4.40 - 5.90 MIL/uL   Hemoglobin 17.4 13.0 - 18.0 g/dL   HCT 49.5 40.0 - 52.0 %   MCV 104.8 (H) 80.0 - 100.0 fL   MCH 36.9 (H) 26.0 - 34.0 pg   MCHC 35.2 32.0 - 36.0 g/dL   RDW 14.9 (H) 11.5 - 14.5 %   Platelets 292 150 - 440 K/uL  Heparin level (unfractionated)     Status: None   Collection Time: 10/21/16  6:09 AM  Result Value Ref Range   Heparin Unfractionated 0.56 0.30 - 0.70 IU/mL    Comment:        IF HEPARIN RESULTS ARE BELOW EXPECTED VALUES, AND PATIENT DOSAGE HAS BEEN CONFIRMED, SUGGEST FOLLOW UP TESTING OF ANTITHROMBIN III LEVELS.   CBC     Status: Abnormal   Collection Time: 10/22/16  4:40 AM  Result Value Ref Range   WBC 7.1 3.8 - 10.6 K/uL   RBC 4.67 4.40 - 5.90 MIL/uL   Hemoglobin 17.2 13.0 - 18.0 g/dL   HCT 48.8 40.0 - 52.0 %   MCV 104.5 (H) 80.0 - 100.0 fL   MCH 36.8 (H) 26.0 - 34.0 pg   MCHC  35.2 32.0 - 36.0 g/dL   RDW 14.5 11.5 - 14.5 %   Platelets 296 150 - 440 K/uL  Heparin level (unfractionated)     Status: None   Collection Time: 10/22/16  4:40 AM  Result Value Ref Range   Heparin Unfractionated 0.37 0.30 - 0.70 IU/mL    Comment:        IF HEPARIN RESULTS ARE BELOW EXPECTED VALUES, AND PATIENT DOSAGE HAS BEEN CONFIRMED, SUGGEST FOLLOW UP TESTING OF ANTITHROMBIN III LEVELS.   Protime-INR     Status: None   Collection Time: 10/22/16  4:40 AM  Result Value Ref Range   Prothrombin Time 14.4 11.4 - 15.2 seconds   INR 0.99   Basic metabolic panel     Status: Abnormal   Collection Time: 10/22/16 12:43 PM  Result Value Ref Range   Sodium 135 135 - 145 mmol/L   Potassium 4.0 3.5 - 5.1 mmol/L   Chloride 101 101 - 111 mmol/L   CO2 24 22 - 32 mmol/L   Glucose, Bld 100 (H) 65 - 99 mg/dL   BUN 7 6 - 20 mg/dL   Creatinine, Ser 1.11  0.61 - 1.24 mg/dL   Calcium 9.1 8.9 - 10.3 mg/dL   GFR calc non Af Amer >60 >60 mL/min   GFR calc Af Amer >60 >60 mL/min    Comment: (NOTE) The eGFR has been calculated using the CKD EPI equation. This calculation has not been validated in all clinical situations. eGFR's persistently <60 mL/min signify possible Chronic Kidney Disease.    Anion gap 10 5 - 15  CBC     Status: Abnormal   Collection Time: 10/22/16 12:43 PM  Result Value Ref Range   WBC 8.3 3.8 - 10.6 K/uL   RBC 5.23 4.40 - 5.90 MIL/uL   Hemoglobin 19.0 (H) 13.0 - 18.0 g/dL   HCT 54.4 (H) 40.0 - 52.0 %   MCV 104.1 (H) 80.0 - 100.0 fL   MCH 36.3 (H) 26.0 - 34.0 pg   MCHC 34.8 32.0 - 36.0 g/dL   RDW 14.9 (H) 11.5 - 14.5 %   Platelets 284 150 - 440 K/uL  Heparin level (unfractionated)     Status: None   Collection Time: 10/22/16  4:15 PM  Result Value Ref Range   Heparin Unfractionated 0.46 0.30 - 0.70 IU/mL    Comment:        IF HEPARIN RESULTS ARE BELOW EXPECTED VALUES, AND PATIENT DOSAGE HAS BEEN CONFIRMED, SUGGEST FOLLOW UP TESTING OF ANTITHROMBIN III LEVELS.     Heparin level (unfractionated)     Status: None   Collection Time: 10/22/16 10:24 PM  Result Value Ref Range   Heparin Unfractionated 0.56 0.30 - 0.70 IU/mL    Comment:        IF HEPARIN RESULTS ARE BELOW EXPECTED VALUES, AND PATIENT DOSAGE HAS BEEN CONFIRMED, SUGGEST FOLLOW UP TESTING OF ANTITHROMBIN III LEVELS.   CBC     Status: Abnormal   Collection Time: 10/23/16  5:16 AM  Result Value Ref Range   WBC 10.1 3.8 - 10.6 K/uL   RBC 4.84 4.40 - 5.90 MIL/uL   Hemoglobin 17.7 13.0 - 18.0 g/dL   HCT 50.9 40.0 - 52.0 %   MCV 105.3 (H) 80.0 - 100.0 fL   MCH 36.6 (H) 26.0 - 34.0 pg   MCHC 34.7 32.0 - 36.0 g/dL   RDW 14.9 (H) 11.5 - 14.5 %   Platelets 246 150 - 440 K/uL  Urinalysis, Routine w reflex microscopic     Status: Abnormal   Collection Time: 12/10/16 10:41 AM  Result Value Ref Range   Color, Urine YELLOW YELLOW   APPearance HAZY (A) CLEAR   Specific Gravity, Urine 1.018 1.005 - 1.030   pH 6.0 5.0 - 8.0   Glucose, UA NEGATIVE NEGATIVE mg/dL   Hgb urine dipstick NEGATIVE NEGATIVE   Bilirubin Urine NEGATIVE NEGATIVE   Ketones, ur NEGATIVE NEGATIVE mg/dL   Protein, ur NEGATIVE NEGATIVE mg/dL   Nitrite NEGATIVE NEGATIVE   Leukocytes, UA MODERATE (A) NEGATIVE   RBC / HPF 0-5 0 - 5 RBC/hpf   WBC, UA 6-30 0 - 5 WBC/hpf   Bacteria, UA NONE SEEN NONE SEEN   Mucous PRESENT   Urine culture     Status: Abnormal   Collection Time: 12/10/16 10:41 AM  Result Value Ref Range   Specimen Description URINE, CLEAN CATCH    Special Requests NONE    Culture MULTIPLE SPECIES PRESENT, SUGGEST RECOLLECTION (A)    Report Status 12/12/2016 FINAL   CBC     Status: Abnormal   Collection Time: 12/10/16 11:28 AM  Result Value Ref Range  WBC 8.0 4.0 - 10.5 K/uL   RBC 5.07 4.22 - 5.81 MIL/uL   Hemoglobin 18.2 (H) 13.0 - 17.0 g/dL   HCT 51.1 39.0 - 52.0 %   MCV 100.8 (H) 78.0 - 100.0 fL   MCH 35.9 (H) 26.0 - 34.0 pg   MCHC 35.6 30.0 - 36.0 g/dL   RDW 13.7 11.5 - 15.5 %   Platelets 240 150  - 400 K/uL  Basic metabolic panel     Status: Abnormal   Collection Time: 12/10/16 11:28 AM  Result Value Ref Range   Sodium 134 (L) 135 - 145 mmol/L   Potassium 4.5 3.5 - 5.1 mmol/L   Chloride 100 (L) 101 - 111 mmol/L   CO2 28 22 - 32 mmol/L   Glucose, Bld 91 65 - 99 mg/dL   BUN 13 6 - 20 mg/dL   Creatinine, Ser 1.18 0.61 - 1.24 mg/dL   Calcium 9.1 8.9 - 10.3 mg/dL   GFR calc non Af Amer >60 >60 mL/min   GFR calc Af Amer >60 >60 mL/min    Comment: (NOTE) The eGFR has been calculated using the CKD EPI equation. This calculation has not been validated in all clinical situations. eGFR's persistently <60 mL/min signify possible Chronic Kidney Disease.    Anion gap 6 5 - 15    Radiology No results found.   Assessment/Plan  Acute pulmonary embolism (HCC) Remains on anticoagulation.    Pain in limb Likely some postphlebitic symptoms. Recommended compression stockings and prescribe these today. Recommended increasing his activity and elevating his legs. He will be returning to clinic in about 8 weeks to discuss filter removal.  DVT (deep venous thrombosis) ([place]) The patient now has partially occlusive thrombus in the right popliteal vein up through the femoral vein. This is of uncertain chronicity, it is likely subacute to becoming chronic. He has been getting treatment for a little over 2 months now. I think we will hold on removing his filter for another couple of months. Continue anticoagulation. Return to clinic in about 6-8 weeks in follow-up to discuss filter removal.    Leotis Pain, MD  12/27/2016 4:37 PM    This note was created with Dragon medical transcription system.  Any errors from dictation are purely unintentional

## 2016-12-27 NOTE — Assessment & Plan Note (Signed)
Likely some postphlebitic symptoms. Recommended compression stockings and prescribe these today. Recommended increasing his activity and elevating his legs. He will be returning to clinic in about 8 weeks to discuss filter removal.

## 2016-12-27 NOTE — Assessment & Plan Note (Signed)
Remains on anticoagulation 

## 2017-01-28 ENCOUNTER — Encounter (HOSPITAL_COMMUNITY): Payer: Self-pay | Admitting: Emergency Medicine

## 2017-01-28 ENCOUNTER — Emergency Department (HOSPITAL_COMMUNITY)
Admission: EM | Admit: 2017-01-28 | Discharge: 2017-01-29 | Disposition: A | Discharge status: Home / Self Care | Payer: Medicaid Other | Attending: Emergency Medicine | Admitting: Emergency Medicine

## 2017-01-28 ENCOUNTER — Telehealth (INDEPENDENT_AMBULATORY_CARE_PROVIDER_SITE_OTHER): Payer: Self-pay | Admitting: Vascular Surgery

## 2017-01-28 DIAGNOSIS — M79651 Pain in right thigh: Secondary | ICD-10-CM | POA: Insufficient documentation

## 2017-01-28 DIAGNOSIS — J45909 Unspecified asthma, uncomplicated: Secondary | ICD-10-CM | POA: Diagnosis not present

## 2017-01-28 DIAGNOSIS — I10 Essential (primary) hypertension: Secondary | ICD-10-CM | POA: Diagnosis not present

## 2017-01-28 DIAGNOSIS — R1031 Right lower quadrant pain: Secondary | ICD-10-CM | POA: Diagnosis present

## 2017-01-28 DIAGNOSIS — N451 Epididymitis: Secondary | ICD-10-CM | POA: Insufficient documentation

## 2017-01-28 DIAGNOSIS — F17200 Nicotine dependence, unspecified, uncomplicated: Secondary | ICD-10-CM | POA: Diagnosis not present

## 2017-01-28 NOTE — Telephone Encounter (Signed)
PATIENT CALLED REPORTING THAT HE IS HAVING THE SAME SYMPTOMS AS WHEN HE HAD A DVT IN January. I OFFERED HIM TO COME IN TODAY AT 1:45 TO HAVE AN [place] AND BE SEEN. HE STATED THAT HE LIVES IN [place] AND IT WOULD TAKE HIM A WEEK TO SCHEDULE A RIDE. I ADVISED HIM HE SHOULD GO TO HIS NEAREST ER TO BE EVALUATED AND GET AN ULTRASOUND TO DETERMINE IF HE HAS A DVT.

## 2017-01-28 NOTE — ED Triage Notes (Signed)
Pt c/o right upper thigh pain x 2-3 weeks. Pt states he thinks he has a blood clot again.

## 2017-01-29 ENCOUNTER — Ambulatory Visit (HOSPITAL_COMMUNITY)
Admit: 2017-01-29 | Discharge: 2017-01-29 | Disposition: A | Discharge status: Home / Self Care | Payer: Medicaid Other | Attending: Emergency Medicine | Admitting: Emergency Medicine

## 2017-01-29 DIAGNOSIS — Z86718 Personal history of other venous thrombosis and embolism: Secondary | ICD-10-CM | POA: Diagnosis not present

## 2017-01-29 DIAGNOSIS — I82411 Acute embolism and thrombosis of right femoral vein: Secondary | ICD-10-CM | POA: Diagnosis not present

## 2017-01-29 DIAGNOSIS — Z7901 Long term (current) use of anticoagulants: Secondary | ICD-10-CM | POA: Insufficient documentation

## 2017-01-29 DIAGNOSIS — M79651 Pain in right thigh: Secondary | ICD-10-CM | POA: Diagnosis present

## 2017-01-29 LAB — URINALYSIS, ROUTINE W REFLEX MICROSCOPIC
Bilirubin Urine: NEGATIVE
Glucose, UA: NEGATIVE mg/dL
Hgb urine dipstick: NEGATIVE
KETONES UR: NEGATIVE mg/dL
Nitrite: NEGATIVE
PROTEIN: NEGATIVE mg/dL
Specific Gravity, Urine: 1.005 (ref 1.005–1.030)
pH: 6 (ref 5.0–8.0)

## 2017-01-29 MED ORDER — DOXYCYCLINE HYCLATE 100 MG PO CAPS: 100.0000 mg | ORAL_CAPSULE | Freq: Two times a day (BID) | ORAL | 0 refills | Status: DC

## 2017-01-29 MED ORDER — CIPROFLOXACIN HCL 250 MG PO TABS
500.0000 mg | ORAL_TABLET | Freq: Once | ORAL | Status: AC
  Administered 2017-01-29: 500 mg via ORAL
  Filled 2017-01-29: qty 2

## 2017-01-29 NOTE — ED Provider Notes (Signed)
[facility] DEPT Provider Note   CSN: [id number] Arrival date & time: 01/28/17  1927    By signing my name below, I, Lance Cohen, attest that this documentation has been prepared under the direction and in the presence of Lance Porter, MD. Electronically Signed: [name], ED Scribe. 01/29/17. 12:58 AM.  Time Seen: 00:48 AM  History   Chief Complaint Chief Complaint  Patient presents with  . Leg Pain   The history is provided by the patient and a relative. No language interpreter was used.   HPI Comments: Lance Cohen is a 46 y.o. male with PMHx of bipolar and DVT/PE in his right thigh in January treated at [facility], who presents to the Emergency Department complaining of gradually worsening, constant, right-sided upper leg pain accompanied by right groin pain onset three weeks ago. Per pt's chart review, pt notes having an IVC filter placed on 10/21/16 for a blood clot from right knee up to his groin.. Pt reports taking his prescribed Eliquis. Pt states "I think I have another blood clot in my leg". He states about 3 weeks ago he started noticing swelling in his right foot in any right groin. Pt notes having mild swelling to the right side of his thigh and foot. He reports associated episodic vomiting about 2 episodes a day. No alleviating factors noted. Pt denies diarrhea, chest pain, SOB, fever. He complains of pain in his right groin but denies testicular pain. He also complains of awakening every morning with an erection for couple hours. He states he had surgical removal of the blood clot from his right leg done at [facility]. He states he was last seen there on March 28 and had a repeat Doppler ultrasound done. He was told everything was doing fine. When I review the chart he was seen on March 9 by Dr. Lucky Cowboy, who notes he had a partially occlusive thrombus in the right popliteal vein up through the femoral vein. He states it is of uncertain chronicity. He  feels it is subacute and becoming chronic. He states that they decided to wait before removing his IVC filter for another couple months. He is to continue his anticoagulation and to return on April 27. Patient states he had called and they were going to see him at 1 PM today however he did not have transportation to [place].  Pt is current smoker, smoking half a pack of cigarettes a day but denies alcohol use.   PCP [facility]  Vascular Dr Lucky Cowboy  Past Medical History:  Diagnosis Date  . Asthma   . Bipolar 1 disorder ([place])   . Chronic knee pain   . Hypertension   . Suicide attempt    2010 Intentional overdose attempt with Depakote.    Patient Active Problem List   Diagnosis Date Noted  . Pain in limb 12/27/2016  . Acute pulmonary embolism ([place]) 10/23/2016  . Nausea and vomiting 10/23/2016  . S/P IVC filter 10/23/2016  . Tobacco abuse counseling 10/23/2016  . DVT (deep venous thrombosis) ([place]) 10/18/2016  . Cellulitis of foot 06/24/2011  . Bipolar 1 disorder ([place]) 06/24/2011  . Tobacco abuse 06/24/2011  . Polycythemia 06/24/2011  . Tinea pedis 06/24/2011  . CARPAL TUNNEL SYNDROME, RIGHT 06/21/2010  . CONTUSION, LEFT FOOT 06/05/2010  . FOREIGN BODY, FOOT 04/17/2010    Past Surgical History:  Procedure Laterality Date  . FOOT SURGERY     had peice of wire removed  . PERIPHERAL VASCULAR CATHETERIZATION Right  10/22/2016   Procedure: Thrombectomy, thrombolyisis;  Surgeon: [name], MD;  Location: [facility];  Service: Cardiovascular;  Laterality: Right;  . PERIPHERAL VASCULAR CATHETERIZATION N/A 10/22/2016   Procedure: IVC Filter Insertion;  Surgeon: [name], MD;  Location: [facility];  Service: Cardiovascular;  Laterality: N/A;       Home Medications    Prior to Admission medications   Medication Sig Start Date End Date Taking? Authorizing Provider  apixaban (ELIQUIS) 5 MG TABS tablet Take 1 tablet (5 mg total) by  mouth 2 (two) times daily. 10/30/16  Yes Theodoro Grist, MD  gabapentin (NEURONTIN) 300 MG capsule Take 300 mg by mouth 4 (four) times daily.   Yes Historical Provider, MD  OLANZapine (ZYPREXA) 20 MG tablet Take 20 mg by mouth at bedtime.   Yes Historical Provider, MD  doxycycline (VIBRAMYCIN) 100 MG capsule Take 1 capsule (100 mg total) by mouth 2 (two) times daily. 01/29/17   Lance Porter, MD    Family History Family History  Problem Relation Age of Onset  . Cancer Mother   . Heart attack Father     Social History Social History  Substance Use Topics  . Smoking status: Current Every Day Smoker    Packs/day: 0.50  . Smokeless tobacco: Never Used  . Alcohol use Yes     Comment: occasionally  on disability for bipolar    Allergies   Bee venom; Penicillins; and Sulfa antibiotics   Review of Systems Review of Systems  Constitutional: Negative for fever.  Respiratory: Negative for shortness of breath.   Cardiovascular: Positive for leg swelling. Negative for chest pain.  Gastrointestinal: Positive for vomiting. Negative for diarrhea.  Musculoskeletal: Positive for myalgias.  All other systems reviewed and are negative.    Physical Exam Updated Vital Signs BP 119/89   Pulse 90   Temp 97.7 F (36.5 C)   Resp 20   Ht 5\' 11"  (1.803 m)   Wt 240 lb (108.9 kg)   SpO2 95%   BMI 33.47 kg/m   Physical Exam  Constitutional: He is oriented to person, place, and time. He appears well-developed and well-nourished.  Non-toxic appearance. He does not appear ill. No distress.  HENT:  Head: Normocephalic and atraumatic.  Right Ear: External ear normal.  Left Ear: External ear normal.  Nose: Nose normal.  Mouth/Throat: Oropharynx is clear and moist.  Eyes: Conjunctivae and EOM are normal. Pupils are equal, round, and reactive to light.  Neck: Normal range of motion and full passive range of motion without pain. Neck supple.  Cardiovascular: Normal rate, regular rhythm and normal  heart sounds.  Exam reveals no gallop and no friction rub.   No murmur heard. Pulmonary/Chest: Effort normal and breath sounds normal. No respiratory distress. He has no wheezes. He has no rhonchi. He has no rales. He exhibits no tenderness and no crepitus.  Abdominal: Soft. Normal appearance and bowel sounds are normal. He exhibits no distension. There is no tenderness. There is no rebound and no guarding.  Genitourinary:     Genitourinary Comments: He is TTP over the right epididymis that reproduces his complaints of pain.    Musculoskeletal: Normal range of motion. He exhibits no edema or tenderness.       Legs: Moves all extremities well. Normal gait. No obvious swelling of right lower extremities. Tender to right proximal medial thigh. No masses felt. No redness to skin. No warmth.   Neurological: He is alert and oriented  to person, place, and time. He has normal strength. No cranial nerve deficit.  Skin: Skin is warm, dry and intact. No rash noted. No erythema. No pallor.  Psychiatric: He has a normal mood and affect. His speech is normal and behavior is normal. His mood appears not anxious.  Nursing note and vitals reviewed.    ED Treatments / Results   DIAGNOSTIC STUDIES: Oxygen Saturation is 95% on RA, adequate by my interpretation.    Labs (all labs ordered are listed, but only abnormal results are displayed) Results for orders placed or performed during the hospital encounter of 01/28/17  Urinalysis, Routine w reflex microscopic  Result Value Ref Range   Color, Urine YELLOW YELLOW   APPearance CLEAR CLEAR   Specific Gravity, Urine 1.005 1.005 - 1.030   pH 6.0 5.0 - 8.0   Glucose, UA NEGATIVE NEGATIVE mg/dL   Hgb urine dipstick NEGATIVE NEGATIVE   Bilirubin Urine NEGATIVE NEGATIVE   Ketones, ur NEGATIVE NEGATIVE mg/dL   Protein, ur NEGATIVE NEGATIVE mg/dL   Nitrite NEGATIVE NEGATIVE   Leukocytes, UA SMALL (A) NEGATIVE   RBC / HPF 0-5 0 - 5 RBC/hpf   WBC, UA 6-30 0 -  5 WBC/hpf   Bacteria, UA RARE (A) NONE SEEN   Squamous Epithelial / LPF 0-5 (A) NONE SEEN   Laboratory interpretation all normal except possible UTI    Procedures Procedures (including critical care time)  Medications Ordered in ED Medications  ciprofloxacin (CIPRO) tablet 500 mg (not administered)     Initial Impression / Assessment and Plan / ED Course  I have reviewed the triage vital signs and the nursing notes.  Pertinent labs & imaging results that were available during my care of the patient were reviewed by me and considered in my medical decision making (see chart for details).  COORDINATION OF CARE: 12:56 AM Discussed treatment plan with pt at bedside which includes UA  and pt agreed to plan.  Patient is concerned he has DVT again in his right thigh. Doppler ultrasound not available at night. Patient is willing to come back later today as an outpatient get the ultrasound study done. UA was done. I suspect patient has epididymitis as part of some his groin pain.   Final Clinical Impressions(s) / ED Diagnoses   Final diagnoses:  Epididymitis  Acute pain of right thigh    New Prescriptions New Prescriptions   DOXYCYCLINE (VIBRAMYCIN) 100 MG CAPSULE    Take 1 capsule (100 mg total) by mouth 2 (two) times daily.   Plan discharge  Lance Porter, MD, FACEP   I personally performed the services described in this documentation, which was scribed in my presence. The recorded information has been reviewed and considered.  Lance Porter, MD, Barbette Or, MD 01/29/17 [phone]

## 2017-01-29 NOTE — Discharge Instructions (Signed)
Take the antibiotic until gone. You can use heat/ice for comfort. Take acetaminophen 650 mg 4 times a day for pain. You are scheduled for an outpatient doppler ultrasound of your right leg later today at 2:30 PM. Arrive 15 minutes early and check in at Radiology for your test. You will be given the results of your test by the day ED doctor. Continue your Eliquis.  Return to the ED if you get chest pain or shortness of breath.

## 2017-02-14 ENCOUNTER — Encounter (INDEPENDENT_AMBULATORY_CARE_PROVIDER_SITE_OTHER): Payer: Self-pay

## 2017-02-14 ENCOUNTER — Encounter (INDEPENDENT_AMBULATORY_CARE_PROVIDER_SITE_OTHER): Payer: Self-pay | Admitting: Vascular Surgery

## 2017-02-14 ENCOUNTER — Ambulatory Visit (INDEPENDENT_AMBULATORY_CARE_PROVIDER_SITE_OTHER): Payer: Medicaid Other | Admitting: Vascular Surgery

## 2017-02-14 VITALS — BP 116/76 | HR 65 | Resp 17 | Wt 246.0 lb

## 2017-02-14 DIAGNOSIS — Z95828 Presence of other vascular implants and grafts: Secondary | ICD-10-CM | POA: Diagnosis not present

## 2017-02-14 DIAGNOSIS — I82411 Acute embolism and thrombosis of right femoral vein: Secondary | ICD-10-CM

## 2017-02-14 NOTE — Assessment & Plan Note (Signed)
He still continues to complain of a lot of pain. His DVT is now more chronic appearing and less extensive than previously. He has not been wearing his compression stockings and he should wear these regularly. Elevation and activity would also be helpful. Were going to remove his filter in about 2 weeks.

## 2017-02-14 NOTE — Assessment & Plan Note (Signed)
With a chronic appearing less extensive DVT, we can now remove his filter. I have discussed the risks and benefits of removal. At this point he should have his IVC filter removed and this has been scheduled for 02/24/2017.

## 2017-02-14 NOTE — Progress Notes (Signed)
MRN : [id number]  Lance Cohen is a 46 y.o. ([birth year]) male who presents with chief complaint of  Chief Complaint  Patient presents with  . Follow-up  .  History of Present Illness: Patient returns today in follow up of An extensive right lower extremity DVT. He still complains a lot of pain and swelling in that right leg. He complains of a lot of pain up in the groin area. He reports no chest pain or shortness of breath. He had an ultrasound performed the hospital about 3 weeks ago which demonstrated a chronic-appearing right lower extremity DVT less extensive than his original study.        Current Outpatient Prescriptions  Medication Sig Dispense Refill  . apixaban (ELIQUIS) 5 MG TABS tablet Take 2 tablets (10 mg total) by mouth 2 (two) times daily. 28 tablet 0  . [START ON 10/30/2016] apixaban (ELIQUIS) 5 MG TABS tablet Take 1 tablet (5 mg total) by mouth 2 (two) times daily. 60 tablet 6  . docusate sodium (COLACE) 100 MG capsule Take 1 capsule (100 mg total) by mouth 2 (two) times daily. 10 capsule 0  . ibuprofen (ADVIL,MOTRIN) 800 MG tablet Take 1 tablet (800 mg total) by mouth 3 (three) times daily. 21 tablet 0  . nicotine (NICODERM CQ - DOSED IN MG/24 HOURS) 14 mg/24hr patch Place 1 patch (14 mg total) onto the skin daily. 28 patch 0  . oxyCODONE-acetaminophen (PERCOCET) 7.5-325 MG tablet Take 1 tablet by mouth every 4 (four) hours as needed for severe pain. 30 tablet 0  . polyethylene glycol (MIRALAX / GLYCOLAX) packet Take 17 g by mouth daily as needed for mild constipation. 14 each 0   No current facility-administered medications for this visit.         Past Medical History:  Diagnosis Date  . Asthma   . Bipolar 1 disorder ([place])   . Chronic knee pain   . Hypertension   . Suicide attempt    2010 Intentional overdose attempt with Depakote.         Past Surgical History:  Procedure Laterality Date  . FOOT SURGERY     had peice of wire  removed  . PERIPHERAL VASCULAR CATHETERIZATION Right 10/22/2016   Procedure: Thrombectomy, thrombolyisis; Surgeon: [name], MD; Location: [facility]; Service: Cardiovascular; Laterality: Right;  . PERIPHERAL VASCULAR CATHETERIZATION N/A 10/22/2016   Procedure: IVC Filter Insertion; Surgeon: [name], MD; Location: [facility]; Service: Cardiovascular; Laterality: N/A;    Social History        Social History  Substance Use Topics  . Smoking status: Current Every Day Smoker    Packs/day: 0.50  . Smokeless tobacco: Never Used  . Alcohol use Yes      Comment: occasionally    Family History      Family History  Problem Relation Age of Onset  . Cancer Mother   . Heart attack Father          Allergies  Allergen Reactions  . Bee Venom Swelling  . Penicillins Other (See Comments)    PT states that he had symptoms that felt like he was having a heart attack Has patient had a PCN reaction causing immediate rash, facial/tongue/throat swelling, SOB or lightheadedness with hypotension: No Has patient had a PCN reaction causing severe rash involving mucus membranes or skin necrosis: No Has patient had a PCN reaction that required hospitalization No Has patient had a PCN reaction occurring within  the last 10 years: No If all of the above answers are "NO", then may proceed with Cephal  . Sulfa Antibiotics Itching     REVIEW OF SYSTEMS(Negative unless checked)  Constitutional: _0 Weight loss_1 Fever_2 Chills Cardiac:_3 Chest pain_4 Chest pressure_5 Palpitations _6 Shortness of breath when laying flat _7 Shortness of breath at rest _8 Shortness of breath with exertion. Vascular: _9 Pain in legs with walking_10 Pain in legsat rest_11 Pain in legs when laying flat _12 Claudication _13 Pain in feet when walking _14 Pain in feet at rest _15 Pain in feet when laying flat _16 History of DVT _17 Phlebitis  _18 Swelling in legs _19 Varicose veins _20 Non-healing ulcers Pulmonary: _21 Uses home oxygen _22 Productive cough_23 Hemoptysis _24 Wheeze _25 COPD _26 Asthma Neurologic: _27 Dizziness _28 Blackouts _29 Seizures _30 History of stroke _31 History of TIA_32 Aphasia _33 Temporary blindness_34 Dysphagia _35 Weaknessor numbness in arms _36 Weakness or numbnessin legs Musculoskeletal: _37 Arthritis _38 Joint swelling _39 Joint pain _40 Low back pain Hematologic:_41 Easy bruising_42 Easy bleeding _43 Hypercoagulable state _44 Anemic  Gastrointestinal:_45 Blood in stool_46 Vomiting blood_47 Gastroesophageal reflux/heartburn_48 Abdominal pain Genitourinary: _49 Chronic kidney disease _50 Difficulturination _51 Frequenturination _52 Burning with urination_53 Hematuria Skin: _54 Rashes _55 Ulcers _56 Wounds Psychological: _57 History of anxiety_58 History of major depression.   Physical Examination  BP 116/76   Pulse 65   Resp 17   Wt 246 lb (111.6 kg)   BMI 34.31 kg/m  Gen:  WD/WN, NAD Head: [place]/AT, No temporalis wasting.  Ear/Nose/Throat: Hearing grossly intact, dentition is very poor Eyes: Conjunctiva clear. Sclera non-icteric Neck: Supple.  No JVD.  Pulmonary:  Good air movement, no use of accessory muscles.  Cardiac: RRR, normal S1, S2 Vascular:  Vessel Right Left  Radial Palpable Palpable  Ulnar Palpable Palpable  Brachial Palpable Palpable  Carotid Palpable, without bruit Palpable, without bruit  Aorta Not palpable N/A  Femoral Palpable Palpable  Popliteal Palpable Palpable  PT Palpable Palpable  DP Palpable Palpable   Gastrointestinal: soft, non-tender/non-distended. No guarding/reflex.  Musculoskeletal: M/S 5/5 throughout.  No deformity or atrophy. 1+ right lower extremity edema. Neurologic: Sensation grossly intact in extremities.  Symmetrical.  Speech is fluent.  Psychiatric: Judgment intact, Mood & affect appropriate for pt's clinical situation. Dermatologic: No rashes  or ulcers noted.  No cellulitis or open wounds. Lymph : No Cervical, Axillary, or Inguinal lymphadenopathy.      Labs Recent Results (from the past 2160 hour(s))  Urinalysis, Routine w reflex microscopic     Status: Abnormal   Collection Time: 12/10/16 10:41 AM  Result Value Ref Range   Color, Urine YELLOW YELLOW   APPearance HAZY (A) CLEAR   Specific Gravity, Urine 1.018 1.005 - 1.030   pH 6.0 5.0 - 8.0   Glucose, UA NEGATIVE NEGATIVE mg/dL   Hgb urine dipstick NEGATIVE NEGATIVE   Bilirubin Urine NEGATIVE NEGATIVE   Ketones, ur NEGATIVE NEGATIVE mg/dL   Protein, ur NEGATIVE NEGATIVE mg/dL   Nitrite NEGATIVE NEGATIVE   Leukocytes, UA MODERATE (A) NEGATIVE   RBC / HPF 0-5 0 - 5 RBC/hpf   WBC, UA 6-30 0 - 5 WBC/hpf   Bacteria, UA NONE SEEN NONE SEEN   Mucous PRESENT   Urine culture     Status: Abnormal   Collection Time: 12/10/16 10:41 AM  Result Value Ref Range   Specimen Description URINE, CLEAN CATCH    Special Requests NONE    Culture MULTIPLE SPECIES PRESENT, SUGGEST RECOLLECTION (A)    Report Status 12/12/2016 FINAL   CBC     Status: Abnormal   Collection Time: 12/10/16 11:28 AM  Result Value Ref Range   WBC 8.0 4.0 - 10.5 K/uL   RBC 5.07 4.22 - 5.81 MIL/uL   Hemoglobin 18.2 (H) 13.0 - 17.0 g/dL   HCT 51.1 39.0 -  52.0 %   MCV 100.8 (H) 78.0 - 100.0 fL   MCH 35.9 (H) 26.0 - 34.0 pg   MCHC 35.6 30.0 - 36.0 g/dL   RDW 13.7 11.5 - 15.5 %   Platelets 240 150 - 400 K/uL  Basic metabolic panel     Status: Abnormal   Collection Time: 12/10/16 11:28 AM  Result Value Ref Range   Sodium 134 (L) 135 - 145 mmol/L   Potassium 4.5 3.5 - 5.1 mmol/L   Chloride 100 (L) 101 - 111 mmol/L   CO2 28 22 - 32 mmol/L   Glucose, Bld 91 65 - 99 mg/dL   BUN 13 6 - 20 mg/dL   Creatinine, Ser 1.18 0.61 - 1.24 mg/dL   Calcium 9.1 8.9 - 10.3 mg/dL   GFR calc non Af Amer >60 >60 mL/min   GFR calc Af Amer >60 >60 mL/min    Comment: (NOTE) The eGFR has been calculated using the CKD EPI  equation. This calculation has not been validated in all clinical situations. eGFR's persistently <60 mL/min signify possible Chronic Kidney Disease.    Anion gap 6 5 - 15  Urinalysis, Routine w reflex microscopic     Status: Abnormal   Collection Time: 01/29/17  1:00 AM  Result Value Ref Range   Color, Urine YELLOW YELLOW   APPearance CLEAR CLEAR   Specific Gravity, Urine 1.005 1.005 - 1.030   pH 6.0 5.0 - 8.0   Glucose, UA NEGATIVE NEGATIVE mg/dL   Hgb urine dipstick NEGATIVE NEGATIVE   Bilirubin Urine NEGATIVE NEGATIVE   Ketones, ur NEGATIVE NEGATIVE mg/dL   Protein, ur NEGATIVE NEGATIVE mg/dL   Nitrite NEGATIVE NEGATIVE   Leukocytes, UA SMALL (A) NEGATIVE   RBC / HPF 0-5 0 - 5 RBC/hpf   WBC, UA 6-30 0 - 5 WBC/hpf   Bacteria, UA RARE (A) NONE SEEN   Squamous Epithelial / LPF 0-5 (A) NONE SEEN    Radiology US Venous Img Lower Unilateral Right  Result Date: 01/29/2017 CLINICAL DATA:  Right thigh pain. History of DVT right leg. On blood thinner. EXAM: Right LOWER EXTREMITY VENOUS DOPPLER ULTRASOUND TECHNIQUE: Gray-scale sonography with graded compression, as well as color Doppler and duplex ultrasound were performed to evaluate the lower extremity deep venous systems from the level of the common femoral vein and including the common femoral, femoral, profunda femoral, popliteal and calf veins including the posterior tibial, peroneal and gastrocnemius veins when visible. The superficial great saphenous vein was also interrogated. Spectral Doppler was utilized to evaluate flow at rest and with distal augmentation maneuvers in the common femoral, femoral and popliteal veins. COMPARISON:  Ultrasound right leg 10/18/2016 FINDINGS: Contralateral Common Femoral Vein: Respiratory phasicity is normal and symmetric with the symptomatic side. No evidence of thrombus. Normal compressibility. Common Femoral Vein: Nonocclusive thrombosis. Saphenofemoral Junction: Occlusive thrombus Profunda Femoral  Vein: Occlusive thrombus Femoral Vein: Occlusive thrombus Popliteal Vein: No evidence of thrombus. Normal compressibility, respiratory phasicity and response to augmentation. Calf Veins: No evidence of thrombus. Normal compressibility and flow on color Doppler imaging. Superficial Great Saphenous Vein: No evidence of thrombus. Normal compressibility and flow on color Doppler imaging. Venous Reflux:  None. Other Findings:  None. IMPRESSION: Extensive DVT in the right femoral vein as above. This has improved from the prior study and may be chronic thrombus. It is not possible to accurately determine the age of the clot. Electronically Signed   By: Franchot Gallo M.D.   On: 01/29/2017 15:05  Assessment/Plan  S/P IVC filter With a chronic appearing less extensive DVT, we can now remove his filter. I have discussed the risks and benefits of removal. At this point he should have his IVC filter removed and this has been scheduled for 02/24/2017.  DVT (deep venous thrombosis) ([place]) He still continues to complain of a lot of pain. His DVT is now more chronic appearing and less extensive than previously. He has not been wearing his compression stockings and he should wear these regularly. Elevation and activity would also be helpful. Were going to remove his filter in about 2 weeks.    Leotis Pain, MD  02/14/2017 1:06 PM    This note was created with Dragon medical transcription system.  Any errors from dictation are purely unintentional

## 2017-02-17 ENCOUNTER — Other Ambulatory Visit (INDEPENDENT_AMBULATORY_CARE_PROVIDER_SITE_OTHER): Payer: Self-pay | Admitting: Vascular Surgery

## 2017-02-23 MED ORDER — CLINDAMYCIN PHOSPHATE 300 MG/50ML IV SOLN: 300.0000 mg | Freq: Once | INTRAVENOUS | Status: DC

## 2017-02-24 ENCOUNTER — Encounter: Payer: Self-pay | Admitting: *Deleted

## 2017-02-24 ENCOUNTER — Encounter
Admission: RE | Disposition: A | Discharge status: Home / Self Care | Payer: Self-pay | Source: Ambulatory Visit | Attending: Vascular Surgery

## 2017-02-24 ENCOUNTER — Ambulatory Visit
Admission: RE | Admit: 2017-02-24 | Discharge: 2017-02-24 | Disposition: A | Discharge status: Home / Self Care | Payer: Medicaid Other | Source: Ambulatory Visit | Attending: Vascular Surgery | Admitting: Vascular Surgery

## 2017-02-24 DIAGNOSIS — Z915 Personal history of self-harm: Secondary | ICD-10-CM | POA: Insufficient documentation

## 2017-02-24 DIAGNOSIS — Z7902 Long term (current) use of antithrombotics/antiplatelets: Secondary | ICD-10-CM | POA: Diagnosis not present

## 2017-02-24 DIAGNOSIS — Z8249 Family history of ischemic heart disease and other diseases of the circulatory system: Secondary | ICD-10-CM | POA: Insufficient documentation

## 2017-02-24 DIAGNOSIS — Z9889 Other specified postprocedural states: Secondary | ICD-10-CM | POA: Insufficient documentation

## 2017-02-24 DIAGNOSIS — Z880 Allergy status to penicillin: Secondary | ICD-10-CM | POA: Diagnosis not present

## 2017-02-24 DIAGNOSIS — Z882 Allergy status to sulfonamides status: Secondary | ICD-10-CM | POA: Diagnosis not present

## 2017-02-24 DIAGNOSIS — Z809 Family history of malignant neoplasm, unspecified: Secondary | ICD-10-CM | POA: Diagnosis not present

## 2017-02-24 DIAGNOSIS — I82411 Acute embolism and thrombosis of right femoral vein: Secondary | ICD-10-CM | POA: Insufficient documentation

## 2017-02-24 DIAGNOSIS — Z452 Encounter for adjustment and management of vascular access device: Secondary | ICD-10-CM | POA: Insufficient documentation

## 2017-02-24 DIAGNOSIS — F17210 Nicotine dependence, cigarettes, uncomplicated: Secondary | ICD-10-CM | POA: Insufficient documentation

## 2017-02-24 DIAGNOSIS — I10 Essential (primary) hypertension: Secondary | ICD-10-CM | POA: Insufficient documentation

## 2017-02-24 DIAGNOSIS — Z91030 Bee allergy status: Secondary | ICD-10-CM | POA: Diagnosis not present

## 2017-02-24 HISTORY — PX: IVC FILTER REMOVAL: CATH118246

## 2017-02-24 SURGERY — IVC FILTER REMOVAL
Anesthesia: Moderate Sedation

## 2017-02-24 MED ORDER — HYDROMORPHONE HCL 1 MG/ML IJ SOLN: 1.0000 mg | Freq: Once | INTRAMUSCULAR | Status: DC | PRN

## 2017-02-24 MED ORDER — LIDOCAINE-EPINEPHRINE (PF) 2 %-1:200000 IJ SOLN
INTRAMUSCULAR | Status: AC
  Filled 2017-02-24: qty 20

## 2017-02-24 MED ORDER — ACETAMINOPHEN 325 MG PO TABS: 325.0000 mg | ORAL_TABLET | ORAL | Status: DC | PRN

## 2017-02-24 MED ORDER — ONDANSETRON HCL 4 MG/2ML IJ SOLN: 4.0000 mg | Freq: Four times a day (QID) | INTRAMUSCULAR | Status: DC | PRN

## 2017-02-24 MED ORDER — ONDANSETRON HCL 4 MG/2ML IJ SOLN
4.0000 mg | Freq: Four times a day (QID) | INTRAMUSCULAR | Status: DC | PRN
Start: 2017-02-24 — End: 2017-02-25
  Administered 2017-02-24: 4 mg via INTRAVENOUS

## 2017-02-24 MED ORDER — HEPARIN (PORCINE) IN NACL 2-0.9 UNIT/ML-% IJ SOLN
INTRAMUSCULAR | Status: AC
  Filled 2017-02-24: qty 500

## 2017-02-24 MED ORDER — FENTANYL CITRATE (PF) 100 MCG/2ML IJ SOLN
INTRAMUSCULAR | Status: DC | PRN
  Administered 2017-02-24: 50 ug via INTRAVENOUS

## 2017-02-24 MED ORDER — GUAIFENESIN-DM 100-10 MG/5ML PO SYRP
15.0000 mL | ORAL_SOLUTION | ORAL | Status: DC | PRN
  Filled 2017-02-24: qty 15

## 2017-02-24 MED ORDER — SODIUM CHLORIDE 0.9 % IV SOLN: INTRAVENOUS | Status: DC

## 2017-02-24 MED ORDER — ACETAMINOPHEN 325 MG RE SUPP
325.0000 mg | RECTAL | Status: DC | PRN
Start: 2017-02-24 — End: 2017-02-25
  Filled 2017-02-24: qty 2

## 2017-02-24 MED ORDER — FENTANYL CITRATE (PF) 100 MCG/2ML IJ SOLN
INTRAMUSCULAR | Status: AC
  Filled 2017-02-24: qty 2

## 2017-02-24 MED ORDER — ALUM & MAG HYDROXIDE-SIMETH 200-200-20 MG/5ML PO SUSP
15.0000 mL | ORAL | Status: DC | PRN
Start: 2017-02-24 — End: 2017-02-25

## 2017-02-24 MED ORDER — LABETALOL HCL 5 MG/ML IV SOLN: 10.0000 mg | INTRAVENOUS | Status: DC | PRN

## 2017-02-24 MED ORDER — METOPROLOL TARTRATE 5 MG/5ML IV SOLN: 2.0000 mg | INTRAVENOUS | Status: DC | PRN

## 2017-02-24 MED ORDER — MORPHINE SULFATE (PF) 4 MG/ML IV SOLN: 2.0000 mg | INTRAVENOUS | Status: DC | PRN

## 2017-02-24 MED ORDER — METOPROLOL TARTRATE 5 MG/5ML IV SOLN
INTRAVENOUS | Status: AC
  Filled 2017-02-24: qty 5

## 2017-02-24 MED ORDER — METOPROLOL TARTRATE 5 MG/5ML IV SOLN
5.0000 mg | Freq: Once | INTRAVENOUS | Status: AC
Start: 2017-02-24 — End: 2017-02-24
  Administered 2017-02-24: 5 mg via INTRAVENOUS

## 2017-02-24 MED ORDER — MIDAZOLAM HCL 5 MG/5ML IJ SOLN
INTRAMUSCULAR | Status: AC
  Filled 2017-02-24: qty 5

## 2017-02-24 MED ORDER — IOPAMIDOL (ISOVUE-300) INJECTION 61%
INTRAVENOUS | Status: DC | PRN
  Administered 2017-02-24: 10 mL via INTRAVENOUS

## 2017-02-24 MED ORDER — PHENOL 1.4 % MT LIQD
1.0000 | OROMUCOSAL | Status: DC | PRN
  Filled 2017-02-24: qty 177

## 2017-02-24 MED ORDER — OXYCODONE-ACETAMINOPHEN 5-325 MG PO TABS: 1.0000 | ORAL_TABLET | ORAL | Status: DC | PRN

## 2017-02-24 MED ORDER — HYDRALAZINE HCL 20 MG/ML IJ SOLN: 5.0000 mg | INTRAMUSCULAR | Status: DC | PRN

## 2017-02-24 MED ORDER — SODIUM CHLORIDE 0.9 % IV SOLN: 500.0000 mL | Freq: Once | INTRAVENOUS | Status: DC | PRN

## 2017-02-24 MED ORDER — METOPROLOL TARTRATE 5 MG/5ML IV SOLN
INTRAVENOUS | Status: AC
  Administered 2017-02-24: 5 mg
  Filled 2017-02-24: qty 5

## 2017-02-24 MED ORDER — ONDANSETRON HCL 4 MG/2ML IJ SOLN
INTRAMUSCULAR | Status: AC
  Filled 2017-02-24: qty 2

## 2017-02-24 MED ORDER — MIDAZOLAM HCL 2 MG/2ML IJ SOLN
INTRAMUSCULAR | Status: DC | PRN
  Administered 2017-02-24: 2 mg via INTRAVENOUS

## 2017-02-24 MED ORDER — METOPROLOL TARTRATE 5 MG/5ML IV SOLN: 5.0000 mg | Freq: Once | INTRAVENOUS | Status: DC

## 2017-02-24 SURGICAL SUPPLY — 3 items
PACK ANGIOGRAPHY (CUSTOM PROCEDURE TRAY) ×2 IMPLANT
SET VENACAVA FILTER RETRIEVAL (MISCELLANEOUS) ×2 IMPLANT
TOWEL OR 17X26 4PK STRL BLUE (TOWEL DISPOSABLE) ×2 IMPLANT

## 2017-02-24 NOTE — H&P (Signed)
[facility] History & Physical Update  The patient was interviewed and re-examined.  The patient's previous History and Physical has been reviewed and is unchanged.  There is no change in the plan of care. We plan to proceed with the scheduled procedure.  Leotis Pain, MD  02/24/2017, 11:48 AM

## 2017-02-24 NOTE — OR Nursing (Signed)
Patient had episode of atrial fibrilation post procedure with hr 140's down to 120's metoprolol 10mg  iv total given valsalva maneuver performed.  Dr. Lucky Cowboy notified and hospitalist consult ordered. 14:56 patient converted to nsr.  Dr. Lucky Cowboy notified and orders received to discharge patient to home.  Discussed with patient need for pcp and given number to [facility] [place] [facility] since patient is medicaid recipient and he states no pcp in [place] accept medicaid.

## 2017-02-24 NOTE — Op Note (Signed)
[facility]   OPERATIVE NOTE    PRE-OPERATIVE DIAGNOSIS:  1. DVT 2. status post IVC filter placement  POST-OPERATIVE DIAGNOSIS: Same as above  PROCEDURE: 1. Ultrasound guidance for vascular access right jugular vein 2. Catheter placement into inferior vena cava from right jugular vein 3. Inferior venacavogram 4. Retrieval of Cook Celect IVC filter  SURGEON: Leotis Pain, MD  ASSISTANT(S): None  ANESTHESIA: Local with moderate conscious sedation for approximately 15 minutes using 2 mg of Versed and 50 mcg of Fentanyl  ESTIMATED BLOOD LOSS: minimal  CONTRAST:  10 cc  FLUORO TIME:  1.1 minutes  FINDING(S): 1. patent IVC  SPECIMEN(S): IVC filter  INDICATIONS:  Patient is a 46 y.o. male who presents with a previous history of IVC filter placement. Patient has tolerated anticoagulation and no longer needs this filter. The patient remains on anticoagulation. Risks and benefits were discussed, and informed consent was obtained.  DESCRIPTION: After obtaining full informed written consent, the patient was brought back to the vascular suite and placed supine upon the table.Moderate conscious sedation was administered during a face to face encounter with the patient throughout the procedure with my supervision of the RN administering medicines and monitoring the patient's vital signs, pulse oximetry, telemetry and mental status throughout from the start of the procedure until the patient was taken to the recovery room.  After obtaining adequate anesthesia, the patient was prepped and draped in the standard fashion. The right jugular vein was visualized with ultrasound and found to be widely patent. It was then accessed under direct ultrasound guidance without difficulty with the Seldinger needle and a permanent image was recorded. A J-wire was placed. After skin nick and dilatation, the retrieval sheath was placed over the wire and advanced into the  inferior vena cava. Inferior vena cava was imaged and found to be widely patent on inferior venacavogram. The filter was reasonably straight in its orientation. The retrieval snare was then placed through the sheath and the hook of the filter was snared without difficulty. The sheath was then advanced, and the filter was collapsed and brought into the sheath in its entirety. It was then removed from the body in its entirety. The retrieval sheath was then removed. Pressure was held at the access site and sterile dressing was placed. The patient was taken to the recovery room in stable condition having tolerated the procedure well.  COMPLICATIONS: None  CONDITION: Stable   Leotis Pain 02/24/2017 12:50 PM  This note was created with Dragon Medical transcription system. Any errors in dictation are purely unintentional.

## 2017-02-25 ENCOUNTER — Encounter: Payer: Self-pay | Admitting: Vascular Surgery

## 2017-04-07 ENCOUNTER — Encounter (INDEPENDENT_AMBULATORY_CARE_PROVIDER_SITE_OTHER): Payer: Medicaid Other | Admitting: Vascular Surgery

## 2017-04-14 ENCOUNTER — Encounter (INDEPENDENT_AMBULATORY_CARE_PROVIDER_SITE_OTHER): Payer: Self-pay | Admitting: Vascular Surgery

## 2017-04-14 ENCOUNTER — Ambulatory Visit (INDEPENDENT_AMBULATORY_CARE_PROVIDER_SITE_OTHER): Payer: Medicaid Other | Admitting: Vascular Surgery

## 2017-04-14 VITALS — BP 138/94 | HR 58 | Resp 16 | Wt 248.0 lb

## 2017-04-14 DIAGNOSIS — Z720 Tobacco use: Secondary | ICD-10-CM

## 2017-04-14 DIAGNOSIS — M79604 Pain in right leg: Secondary | ICD-10-CM

## 2017-04-14 DIAGNOSIS — I824Y3 Acute embolism and thrombosis of unspecified deep veins of proximal lower extremity, bilateral: Secondary | ICD-10-CM | POA: Diagnosis not present

## 2017-04-14 NOTE — Progress Notes (Signed)
Subjective:    Patient ID: Lance Cohen, male    DOB: 1971/05/14, 46 y.o.   MRN: [id number] Chief Complaint  Patient presents with  . post op follow up   Presents status post his first postprocedure follow-up. He is status post an IVC filter removal on 02/24/2017. He presents today without complaint. His post procedure course has been uneventful. His neck access site has healed. He continues to take Eliquis on a daily basis. States his lower extremity swelling and pain have improved. Denies any shortness of breath or chest pain. Denies any fever, nausea or vomiting.   Review of Systems  Constitutional: Negative.   HENT: Negative.   Eyes: Negative.   Respiratory: Negative.   Cardiovascular: Negative.   Gastrointestinal: Negative.   Endocrine: Negative.   Genitourinary: Negative.   Musculoskeletal: Negative.   Skin: Negative.   Allergic/Immunologic: Negative.   Neurological: Negative.   Hematological: Negative.   Psychiatric/Behavioral: Negative.       Objective:   Physical Exam  Constitutional: He is oriented to person, place, and time. He appears well-developed and well-nourished. No distress.  HENT:  Head: Normocephalic and atraumatic.  Eyes: Conjunctivae are normal. Pupils are equal, round, and reactive to light.  Neck: Normal range of motion.  Cardiovascular: Normal rate, regular rhythm, normal heart sounds and intact distal pulses.   Pulses:      Radial pulses are 2+ on the right side, and 2+ on the left side.       Dorsalis pedis pulses are 2+ on the right side, and 2+ on the left side.       Posterior tibial pulses are 2+ on the right side, and 2+ on the left side.  Pulmonary/Chest: Effort normal.  Musculoskeletal: Normal range of motion. He exhibits edema (Mild bilateral lower extremity edema noted).  Neurological: He is alert and oriented to person, place, and time.  Skin: Skin is warm and dry. He is not diaphoretic.     Neck access site healed  Psychiatric:  He has a normal mood and affect. His behavior is normal. Judgment and thought content normal.  Vitals reviewed.   BP (!) 138/94   Pulse (!) 58   Resp 16   Wt 248 lb (112.5 kg)   BMI 34.59 kg/m   Past Medical History:  Diagnosis Date  . Asthma   . Bipolar 1 disorder ([place])   . Chronic knee pain   . Hypertension   . Suicide attempt [facility])    2010 Intentional overdose attempt with Depakote.    Social History   Social History  . Marital status: Married    Spouse name: N/A  . Number of children: N/A  . Years of education: N/A   Occupational History  . Not on file.   Social History Main Topics  . Smoking status: Current Every Day Smoker    Packs/day: 0.50  . Smokeless tobacco: Never Used  . Alcohol use Yes     Comment: occasionally  . Drug use: No  . Sexual activity: Not on file   Other Topics Concern  . Not on file   Social History Narrative  . No narrative on file    Past Surgical History:  Procedure Laterality Date  . FOOT SURGERY     had peice of wire removed  . IVC FILTER REMOVAL N/A 02/24/2017   Procedure: IVC Filter Removal;  Surgeon: [name], MD;  Location: [facility];  Service: Cardiovascular;  Laterality: N/A;  .  PERIPHERAL VASCULAR CATHETERIZATION Right 10/22/2016   Procedure: Thrombectomy, thrombolyisis;  Surgeon: [name], MD;  Location: [facility];  Service: Cardiovascular;  Laterality: Right;  . PERIPHERAL VASCULAR CATHETERIZATION N/A 10/22/2016   Procedure: IVC Filter Insertion;  Surgeon: [name], MD;  Location: [facility];  Service: Cardiovascular;  Laterality: N/A;    Family History  Problem Relation Age of Onset  . Cancer Mother   . Heart attack Father     Allergies  Allergen Reactions  . Bee Venom Swelling  . Penicillins Other (See Comments)    PT states that he had symptoms that felt like he was having a heart attack Has patient had a PCN reaction causing immediate rash,  facial/tongue/throat swelling, SOB or lightheadedness with hypotension: No Has patient had a PCN reaction causing severe rash involving mucus membranes or skin necrosis: No Has patient had a PCN reaction that required hospitalization No Has patient had a PCN reaction occurring within the last 10 years: No If all of the above answers are "NO", then may proceed with Cephal  . Sulfa Antibiotics Itching       Assessment & Plan:  Presents status post his first postprocedure follow-up. He is status post an IVC filter removal on 02/24/2017. He presents today without complaint. His post procedure course has been uneventful. His neck access site has healed. He continues to take Eliquis on a daily basis. States his lower extremity swelling and pain have improved. Denies any shortness of breath or chest pain. Denies any fever, nausea or vomiting.  1. Deep vein thrombosis (DVT) of proximal vein of both lower extremities, unspecified chronicity ([place]) - Stable Patient with IVC filter removal on 02/24/2017. His postprocedure course has been unremarkable The patient has not had any follow-up ultrasound to assess DVT status of his lower extremity. I will order this. Patient should remain on [place] for at least 6 months or even longer due to his extensive DVT. Continue compression and elevation. If he experiences any shortness of breath or chest pain he must seek medical attention immediately Patient expresses understanding.  - VAS [place] LOWER EXTREMITY VENOUS REFLUX; Future  2. Tobacco abuse - stable I have discussed (approximately 5 minutes) with the patient the role of tobacco in the pathogenesis of atherosclerosis and its effect on the progression of the disease, impact on the durability of interventions and its limitations on the formation of collateral pathways. I have recommended absolute tobacco cessation. I have discussed various options available for assistance with tobacco cessation including  over the counter methods (Nicotine gum, patch and lozenges). We also discussed prescription options (Chantix, Nicotine Inhaler / Nasal Spray). The patient is not interested in pursuing any prescription tobacco cessation options at this time. The patient voices their understanding.   3. Pain of right lower extremity - Improved Patient with improved edema and lower extremity pain. Continue compression stockings and elevation.  Current Outpatient Prescriptions on File Prior to Visit  Medication Sig Dispense Refill  . amLODipine (NORVASC) 10 MG tablet Take 10 mg by mouth daily.    Marland Kitchen apixaban (ELIQUIS) 5 MG TABS tablet Take 1 tablet (5 mg total) by mouth 2 (two) times daily. 60 tablet 6  . gabapentin (NEURONTIN) 300 MG capsule Take 300 mg by mouth 3 (three) times daily.     . hydrOXYzine (ATARAX/VISTARIL) 50 MG tablet Take 50 mg by mouth 3 (three) times daily.    Marland Kitchen lisinopril (PRINIVIL,ZESTRIL) 10 MG tablet Take 10 mg  by mouth daily.    . mirtazapine (REMERON) 15 MG tablet Take 15 mg by mouth at bedtime.    Marland Kitchen OLANZapine (ZYPREXA) 20 MG tablet Take 20 mg by mouth at bedtime.     No current facility-administered medications on file prior to visit.     There are no Patient Instructions on file for this visit. No Follow-up on file.   Kha Hari A Argie Applegate, PA-C

## 2017-05-21 ENCOUNTER — Ambulatory Visit (INDEPENDENT_AMBULATORY_CARE_PROVIDER_SITE_OTHER): Payer: Medicaid Other

## 2017-05-21 ENCOUNTER — Other Ambulatory Visit (INDEPENDENT_AMBULATORY_CARE_PROVIDER_SITE_OTHER): Payer: Self-pay | Admitting: Vascular Surgery

## 2017-05-21 DIAGNOSIS — I824Y3 Acute embolism and thrombosis of unspecified deep veins of proximal lower extremity, bilateral: Secondary | ICD-10-CM | POA: Diagnosis not present

## 2017-05-23 ENCOUNTER — Encounter (INDEPENDENT_AMBULATORY_CARE_PROVIDER_SITE_OTHER): Payer: Self-pay | Admitting: Vascular Surgery

## 2017-10-18 ENCOUNTER — Emergency Department (HOSPITAL_COMMUNITY): Payer: Medicaid Other

## 2017-10-18 ENCOUNTER — Other Ambulatory Visit: Payer: Self-pay

## 2017-10-18 ENCOUNTER — Inpatient Hospital Stay (HOSPITAL_COMMUNITY)
Admission: EM | Admit: 2017-10-18 | Discharge: 2017-10-20 | DRG: 313 | Disposition: A | Discharge status: Home / Self Care | Payer: Medicaid Other | Attending: Internal Medicine | Admitting: Internal Medicine

## 2017-10-18 ENCOUNTER — Encounter (HOSPITAL_COMMUNITY): Payer: Self-pay | Admitting: Emergency Medicine

## 2017-10-18 DIAGNOSIS — Z720 Tobacco use: Secondary | ICD-10-CM | POA: Diagnosis present

## 2017-10-18 DIAGNOSIS — J45909 Unspecified asthma, uncomplicated: Secondary | ICD-10-CM

## 2017-10-18 DIAGNOSIS — N182 Chronic kidney disease, stage 2 (mild): Secondary | ICD-10-CM | POA: Diagnosis present

## 2017-10-18 DIAGNOSIS — I2699 Other pulmonary embolism without acute cor pulmonale: Secondary | ICD-10-CM | POA: Diagnosis present

## 2017-10-18 DIAGNOSIS — I10 Essential (primary) hypertension: Secondary | ICD-10-CM

## 2017-10-18 DIAGNOSIS — I129 Hypertensive chronic kidney disease with stage 1 through stage 4 chronic kidney disease, or unspecified chronic kidney disease: Secondary | ICD-10-CM | POA: Diagnosis present

## 2017-10-18 DIAGNOSIS — D751 Secondary polycythemia: Secondary | ICD-10-CM | POA: Diagnosis present

## 2017-10-18 DIAGNOSIS — F17210 Nicotine dependence, cigarettes, uncomplicated: Secondary | ICD-10-CM | POA: Diagnosis present

## 2017-10-18 DIAGNOSIS — R079 Chest pain, unspecified: Principal | ICD-10-CM | POA: Diagnosis present

## 2017-10-18 DIAGNOSIS — M25569 Pain in unspecified knee: Secondary | ICD-10-CM | POA: Diagnosis present

## 2017-10-18 DIAGNOSIS — Z8249 Family history of ischemic heart disease and other diseases of the circulatory system: Secondary | ICD-10-CM

## 2017-10-18 DIAGNOSIS — Z79899 Other long term (current) drug therapy: Secondary | ICD-10-CM

## 2017-10-18 DIAGNOSIS — I82509 Chronic embolism and thrombosis of unspecified deep veins of unspecified lower extremity: Secondary | ICD-10-CM | POA: Diagnosis present

## 2017-10-18 DIAGNOSIS — I82409 Acute embolism and thrombosis of unspecified deep veins of unspecified lower extremity: Secondary | ICD-10-CM | POA: Diagnosis present

## 2017-10-18 DIAGNOSIS — Z86711 Personal history of pulmonary embolism: Secondary | ICD-10-CM | POA: Diagnosis present

## 2017-10-18 DIAGNOSIS — Z915 Personal history of self-harm: Secondary | ICD-10-CM

## 2017-10-18 DIAGNOSIS — Z95828 Presence of other vascular implants and grafts: Secondary | ICD-10-CM

## 2017-10-18 DIAGNOSIS — N179 Acute kidney failure, unspecified: Secondary | ICD-10-CM | POA: Diagnosis present

## 2017-10-18 DIAGNOSIS — Z86718 Personal history of other venous thrombosis and embolism: Secondary | ICD-10-CM

## 2017-10-18 DIAGNOSIS — E860 Dehydration: Secondary | ICD-10-CM | POA: Diagnosis present

## 2017-10-18 DIAGNOSIS — E785 Hyperlipidemia, unspecified: Secondary | ICD-10-CM | POA: Diagnosis present

## 2017-10-18 DIAGNOSIS — M25561 Pain in right knee: Secondary | ICD-10-CM

## 2017-10-18 DIAGNOSIS — F419 Anxiety disorder, unspecified: Secondary | ICD-10-CM | POA: Diagnosis present

## 2017-10-18 DIAGNOSIS — F319 Bipolar disorder, unspecified: Secondary | ICD-10-CM | POA: Diagnosis present

## 2017-10-18 DIAGNOSIS — G8929 Other chronic pain: Secondary | ICD-10-CM | POA: Diagnosis present

## 2017-10-18 LAB — CBC
HCT: 48.7 % (ref 39.0–52.0)
Hemoglobin: 17.6 g/dL — ABNORMAL HIGH (ref 13.0–17.0)
MCH: 35.8 pg — ABNORMAL HIGH (ref 26.0–34.0)
MCHC: 36.1 g/dL — AB (ref 30.0–36.0)
MCV: 99 fL (ref 78.0–100.0)
PLATELETS: 312 10*3/uL (ref 150–400)
RBC: 4.92 MIL/uL (ref 4.22–5.81)
RDW: 14 % (ref 11.5–15.5)
WBC: 12.1 10*3/uL — AB (ref 4.0–10.5)

## 2017-10-18 LAB — BASIC METABOLIC PANEL
Anion gap: 14 (ref 5–15)
BUN: 10 mg/dL (ref 6–20)
CALCIUM: 9.2 mg/dL (ref 8.9–10.3)
CO2: 22 mmol/L (ref 22–32)
CREATININE: 1.63 mg/dL — AB (ref 0.61–1.24)
Chloride: 100 mmol/L — ABNORMAL LOW (ref 101–111)
GFR calc Af Amer: 57 mL/min — ABNORMAL LOW (ref >60)
GFR, EST NON AFRICAN AMERICAN: 49 mL/min — AB (ref >60)
GLUCOSE: 88 mg/dL (ref 65–99)
POTASSIUM: 3.6 mmol/L (ref 3.5–5.1)
SODIUM: 136 mmol/L (ref 135–145)

## 2017-10-18 LAB — I-STAT TROPONIN, ED
TROPONIN I, POC: 0 ng/mL (ref 0.00–0.08)
TROPONIN I, POC: 0 ng/mL (ref 0.00–0.08)

## 2017-10-18 MED ORDER — PROCHLORPERAZINE EDISYLATE 5 MG/ML IJ SOLN
5.0000 mg | Freq: Once | INTRAMUSCULAR | Status: AC
  Administered 2017-10-18: 5 mg via INTRAVENOUS
  Filled 2017-10-18: qty 2

## 2017-10-18 MED ORDER — MORPHINE SULFATE (PF) 4 MG/ML IV SOLN
4.0000 mg | Freq: Once | INTRAVENOUS | Status: AC
  Administered 2017-10-18: 4 mg via INTRAVENOUS
  Filled 2017-10-18: qty 1

## 2017-10-18 MED ORDER — NITROGLYCERIN 2 % TD OINT
0.5000 [in_us] | TOPICAL_OINTMENT | Freq: Once | TRANSDERMAL | Status: AC
  Administered 2017-10-18: 0.5 [in_us] via TOPICAL
  Filled 2017-10-18: qty 1

## 2017-10-18 MED ORDER — IOPAMIDOL (ISOVUE-370) INJECTION 76%
INTRAVENOUS | Status: AC
  Administered 2017-10-18: 100 mL via INTRAVENOUS
  Filled 2017-10-18: qty 100

## 2017-10-18 MED ORDER — DIPHENHYDRAMINE HCL 25 MG PO CAPS
25.0000 mg | ORAL_CAPSULE | Freq: Once | ORAL | Status: AC
  Administered 2017-10-18: 25 mg via ORAL
  Filled 2017-10-18: qty 1

## 2017-10-18 NOTE — ED Notes (Signed)
Patient transported to CT 

## 2017-10-18 NOTE — ED Triage Notes (Signed)
History of LE DVT 1 year ago with IVC filter placement and removal. Not currently complaining of pain in legs. No blood thinners currently.

## 2017-10-18 NOTE — ED Triage Notes (Signed)
Patient arrives with complaint chest pain and headache. Onset today after smoking cigarette around 1900. Denies history of major cardiac problems. Also states vomited PTA and that he is achy all over.

## 2017-10-18 NOTE — ED Provider Notes (Signed)
[facility] EMERGENC[facility] DEPARTMENT Provider Note   CSN: [id number] Arrival date & time: 10/18/17  1923     History   Chief Complaint Chief Complaint  Patient presents with  . Chest Pain  . Headache    HPI Lance Cohen is a 46 y.o. male a history of hypertension and DVT/PE (previous IVC filter placement with removal in March 2018 and no longer on blood thinners) presents to the ED today for chest pain and headache.  Patient states that approximately 7 PM tonight he was smoking a cigarette when he had the development of left-sided chest pain that he describes as sharp.  He notes associated diaphoresis, nausea, vomiting, shortness of breath and dyspnea on exertion associated with this.  The pain is worse with palpation and also on exertion.  He states the pain is been constant since onset.  He has not taken anything for this.  No nitroglycerin or aspirin use today. Patient has significant family history of CVD including father who passed away from heart attack.  Patient is unsure of father's age at time of death.  Patient is a 25-pack-year history.  Patient denies prior echocardiogram, stress test or cardiac catheterization.   Patient also notes that an hour after the start of chest pain he started having headache that he describes as a vice-like banding around his head.  He has not taken anything for this.  Nothing makes his symptoms better or worse. He denies neck pain, numbness/tingling/weakness of extremities, thunderclap onset, worst headache of life, numbness of face, visual changes, or any other associated symptoms.   HPI  Past Medical History:  Diagnosis Date  . Asthma   . Bipolar 1 disorder ([place])   . Chronic knee pain   . Hypertension   . Suicide attempt [facility])    2010 Intentional overdose attempt with Depakote.    Patient Active Problem List   Diagnosis Date Noted  . Pain in limb 12/27/2016  . Acute pulmonary embolism ([place]) 10/23/2016  . Nausea and  vomiting 10/23/2016  . S/P IVC filter 10/23/2016  . Tobacco abuse counseling 10/23/2016  . DVT (deep venous thrombosis) ([place]) 10/18/2016  . Cellulitis of foot 06/24/2011  . Bipolar 1 disorder ([place]) 06/24/2011  . Tobacco abuse 06/24/2011  . Polycythemia 06/24/2011  . Tinea pedis 06/24/2011  . CARPAL TUNNEL SYNDROME, RIGHT 06/21/2010  . CONTUSION, LEFT FOOT 06/05/2010  . FOREIGN BODY, FOOT 04/17/2010    Past Surgical History:  Procedure Laterality Date  . FOOT SURGERY     had peice of wire removed  . IVC FILTER REMOVAL N/A 02/24/2017   Procedure: IVC Filter Removal;  Surgeon: [name], MD;  Location: [facility];  Service: Cardiovascular;  Laterality: N/A;  . PERIPHERAL VASCULAR CATHETERIZATION Right 10/22/2016   Procedure: Thrombectomy, thrombolyisis;  Surgeon: [name], MD;  Location: [facility];  Service: Cardiovascular;  Laterality: Right;  . PERIPHERAL VASCULAR CATHETERIZATION N/A 10/22/2016   Procedure: IVC Filter Insertion;  Surgeon: [name], MD;  Location: [facility];  Service: Cardiovascular;  Laterality: N/A;       Home Medications    Prior to Admission medications   Medication Sig Start Date End Date Taking? Authorizing Provider  amLODipine (NORVASC) 10 MG tablet Take 10 mg by mouth daily.    [provider]  apixaban (ELIQUIS) 5 MG TABS tablet Take 1 tablet (5 mg total) by mouth 2 (two) times daily. 10/30/16   Theodoro Grist, MD  gabapentin (NEURONTIN) 300 MG capsule Take 300 mg by mouth 3 (three) times daily.     [provider]  hydrOXYzine (ATARAX/VISTARIL) 50 MG tablet Take 50 mg by mouth 3 (three) times daily.    [provider]  lisinopril (PRINIVIL,ZESTRIL) 10 MG tablet Take 10 mg by mouth daily.    [provider]  mirtazapine (REMERON) 15 MG tablet Take 15 mg by mouth at bedtime.    [provider]  OLANZapine (ZYPREXA) 20 MG tablet Take 20 mg by mouth at bedtime.     [provider]    Family History Family History  Problem Relation Age of Onset  . Cancer Mother   . Heart attack Father     Social History Social History   Tobacco Use  . Smoking status: Current Every Day Smoker    Packs/day: 0.50  . Smokeless tobacco: Never Used  Substance Use Topics  . Alcohol use: Yes    Comment: occasionally  . Drug use: No     Allergies   Bee venom; Penicillins; and Sulfa antibiotics   Review of Systems Review of Systems  All other systems reviewed and are negative.    Physical Exam Updated Vital Signs BP (!) 144/87   Pulse 87   Temp 99.1 F (37.3 C) (Oral)   Resp 18   SpO2 91%   Physical Exam  Constitutional: He appears well-developed and well-nourished.  HENT:  Head: Normocephalic and atraumatic.  Right Ear: External ear normal.  Left Ear: External ear normal.  Nose: Nose normal.  Mouth/Throat: Uvula is midline, oropharynx is clear and moist and mucous membranes are normal. No tonsillar exudate.  Eyes: Pupils are equal, round, and reactive to light. Right eye exhibits no discharge. Left eye exhibits no discharge. No scleral icterus.  Neck: Trachea normal. Neck supple. No spinous process tenderness present. No neck rigidity. Normal range of motion present.  Cardiovascular: Normal rate, regular rhythm and intact distal pulses.  No murmur heard. Pulses:      Radial pulses are 2+ on the right side, and 2+ on the left side.       Dorsalis pedis pulses are 2+ on the right side, and 2+ on the left side.       Posterior tibial pulses are 2+ on the right side, and 2+ on the left side.  No lower extremity swelling or edema. Calves symmetric in size bilaterally.  Pulmonary/Chest: Effort normal and breath sounds normal. He exhibits tenderness.  Abdominal: Soft. Bowel sounds are normal. There is no tenderness. There is no rebound and no guarding.  Musculoskeletal: He exhibits no edema.  Lymphadenopathy:    He has no cervical  adenopathy.  Neurological: He is alert.  Mental Status:  Alert, oriented, thought content appropriate, able to give a coherent history. Speech fluent without evidence of aphasia. Able to follow 2 step commands without difficulty.  Cranial Nerves:  II:  Peripheral visual fields grossly normal, pupils equal, round, reactive to light III,IV, VI: ptosis not present, extra-ocular motions intact bilaterally  V,VII: smile symmetric, eyebrows raise symmetric, facial light touch sensation equal VIII: hearing grossly normal to voice  X: uvula elevates symmetrically  XI: bilateral shoulder shrug symmetric and strong XII: midline tongue extension without fassiculations Motor:  Normal tone. 5/5 in upper and lower extremities bilaterally including strong and equal grip strength and dorsiflexion/plantar flexion Sensory: Sensation intact to light touch in all extremities. Negative Romberg.  Deep Tendon Reflexes: 2+ and symmetric in the biceps and patella  Cerebellar: normal finger-to-nose with bilateral upper extremities. Normal heel-to -shin balance bilaterally of the lower extremity. No pronator drift.  Gait: normal gait and balance CV: distal pulses palpable throughout   Skin: Skin is warm and dry. No rash noted. He is not diaphoretic.  Psychiatric: He has a normal mood and affect.  Nursing note and vitals reviewed.    ED Treatments / Results  Labs (all labs ordered are listed, but only abnormal results are displayed) Labs Reviewed  BASIC METABOLIC PANEL - Abnormal; Notable for the following components:      Result Value   Chloride 100 (*)    Creatinine, Ser 1.63 (*)    GFR calc non Af Amer 49 (*)    GFR calc Af Amer 57 (*)    All other components within normal limits  CBC - Abnormal; Notable for the following components:   WBC 12.1 (*)    Hemoglobin 17.6 (*)    MCH 35.8 (*)    MCHC 36.1 (*)    All other components within normal limits  I-STAT TROPONIN, ED    EKG  EKG  Interpretation  Date/Time:  Saturday October 18 2017 19:28:55 EST Ventricular Rate:  102 PR Interval:  140 QRS Duration: 84 QT Interval:  342 QTC Calculation: 445 R Axis:   -109 Text Interpretation:  Sinus tachycardia Right superior axis deviation Pulmonary disease pattern Abnormal ECG SINCE LAST TRACING HEART RATE HAS INCREASED Confirmed by Malvin Johns [phone]) on 10/18/2017 11:41:35 PM       Radiology Dg Chest 2 View  Result Date: 10/18/2017 CLINICAL DATA:  46 year old male with chest pain. EXAM: CHEST  2 VIEW COMPARISON:  Chest CT dated 10/18/2016 FINDINGS: The heart size and mediastinal contours are within normal limits. Both lungs are clear. The visualized skeletal structures are unremarkable. IMPRESSION: No active cardiopulmonary disease. Electronically Signed   By: Anner Crete M.D.   On: 10/18/2017 20:33    Procedures Procedures (including critical care time)  Medications Ordered in ED Medications  nitroGLYCERIN (NITROGLYN) 2 % ointment 0.5 inch (0.5 inches Topical Given 10/18/17 2113)  iopamidol (ISOVUE-370) 76 % injection (100 mLs Intravenous Contrast Given 10/18/17 2143)  prochlorperazine (COMPAZINE) injection 5 mg (5 mg Intravenous Given 10/18/17 2227)  diphenhydrAMINE (BENADRYL) capsule 25 mg (25 mg Oral Given 10/18/17 2227)  morphine 4 MG/ML injection 4 mg (4 mg Intravenous Given 10/18/17 2315)  aspirin chewable tablet 324 mg (324 mg Oral Given 10/19/17 0006)     Initial Impression / Assessment and Plan / ED Course  I have reviewed the triage vital signs and the nursing notes.  Pertinent labs & imaging results that were available during my care of the patient were reviewed by me and considered in my medical decision making (see chart for details).     46 y.o. male presenting with chest pain is worse with exertion and associated with nausea, vomiting, shortness of breath.  Patient also endorses headache.  He is neurologically intact without any focal  neurologic deficits.  Suspect these are not related at this time.  Patients vital signs are initially with tachycardia of 109.  Due to the patient's history of PE, not currently being on blood thinners or IVC filter in place, with new onset of chest pain and shortness of breath will order CT scan to rule out PE.  CT PE study was negative for PE.  Labs and chest x-ray done in triage.  Chest x-ray was without any active cardiopulmonary disease.  EKG was without any  ischemic changes.  CBC was with leukocytosis.  BMP with elevation of creatinine to 1.6.  Troponin within normal limits x2.  Chest pain and headache resolved with treatments in the emergency department. Patient heart score 4.  Concern for cardiac etiology of Chest Pain. Will admit for chest pain rule out. Dr. Olevia Bowens of hospitalist service to admit the patient. Pt has been re-evaluated prior to consult and VSS, NAD, heart RRR, pain 0/10, lungs CTAB. He appears safe for admission. This case was discussed with Dr. Tamera Punt who agrees with plan.   Final Clinical Impressions(s) / ED Diagnoses   Final diagnoses:  Chest pain, unspecified type    ED Discharge Orders    None       Jillyn Ledger, PA-C 10/19/17 0106    Malvin Johns, MD 10/19/17 2012

## 2017-10-19 ENCOUNTER — Other Ambulatory Visit: Payer: Self-pay

## 2017-10-19 ENCOUNTER — Encounter (HOSPITAL_COMMUNITY): Payer: Self-pay | Admitting: Internal Medicine

## 2017-10-19 DIAGNOSIS — J45909 Unspecified asthma, uncomplicated: Secondary | ICD-10-CM

## 2017-10-19 DIAGNOSIS — F319 Bipolar disorder, unspecified: Secondary | ICD-10-CM | POA: Diagnosis not present

## 2017-10-19 DIAGNOSIS — F419 Anxiety disorder, unspecified: Secondary | ICD-10-CM | POA: Diagnosis present

## 2017-10-19 DIAGNOSIS — Z720 Tobacco use: Secondary | ICD-10-CM | POA: Diagnosis not present

## 2017-10-19 DIAGNOSIS — N179 Acute kidney failure, unspecified: Secondary | ICD-10-CM | POA: Diagnosis not present

## 2017-10-19 DIAGNOSIS — D751 Secondary polycythemia: Secondary | ICD-10-CM | POA: Diagnosis not present

## 2017-10-19 DIAGNOSIS — I129 Hypertensive chronic kidney disease with stage 1 through stage 4 chronic kidney disease, or unspecified chronic kidney disease: Secondary | ICD-10-CM | POA: Diagnosis not present

## 2017-10-19 DIAGNOSIS — Z79899 Other long term (current) drug therapy: Secondary | ICD-10-CM | POA: Diagnosis not present

## 2017-10-19 DIAGNOSIS — M25569 Pain in unspecified knee: Secondary | ICD-10-CM

## 2017-10-19 DIAGNOSIS — R079 Chest pain, unspecified: Principal | ICD-10-CM

## 2017-10-19 DIAGNOSIS — E860 Dehydration: Secondary | ICD-10-CM | POA: Diagnosis present

## 2017-10-19 DIAGNOSIS — G8929 Other chronic pain: Secondary | ICD-10-CM

## 2017-10-19 DIAGNOSIS — N182 Chronic kidney disease, stage 2 (mild): Secondary | ICD-10-CM | POA: Diagnosis not present

## 2017-10-19 DIAGNOSIS — F17210 Nicotine dependence, cigarettes, uncomplicated: Secondary | ICD-10-CM | POA: Diagnosis not present

## 2017-10-19 DIAGNOSIS — Z86711 Personal history of pulmonary embolism: Secondary | ICD-10-CM | POA: Diagnosis not present

## 2017-10-19 DIAGNOSIS — R0789 Other chest pain: Secondary | ICD-10-CM | POA: Diagnosis not present

## 2017-10-19 DIAGNOSIS — I10 Essential (primary) hypertension: Secondary | ICD-10-CM

## 2017-10-19 DIAGNOSIS — R072 Precordial pain: Secondary | ICD-10-CM | POA: Diagnosis not present

## 2017-10-19 DIAGNOSIS — Z915 Personal history of self-harm: Secondary | ICD-10-CM | POA: Diagnosis not present

## 2017-10-19 DIAGNOSIS — E785 Hyperlipidemia, unspecified: Secondary | ICD-10-CM | POA: Diagnosis present

## 2017-10-19 DIAGNOSIS — Z86718 Personal history of other venous thrombosis and embolism: Secondary | ICD-10-CM | POA: Diagnosis not present

## 2017-10-19 DIAGNOSIS — Z8249 Family history of ischemic heart disease and other diseases of the circulatory system: Secondary | ICD-10-CM | POA: Diagnosis not present

## 2017-10-19 DIAGNOSIS — M25561 Pain in right knee: Secondary | ICD-10-CM

## 2017-10-19 LAB — HIV ANTIBODY (ROUTINE TESTING W REFLEX): HIV Screen 4th Generation wRfx: NONREACTIVE

## 2017-10-19 LAB — URINALYSIS, ROUTINE W REFLEX MICROSCOPIC
Bacteria, UA: NONE SEEN
Bilirubin Urine: NEGATIVE
GLUCOSE, UA: NEGATIVE mg/dL
Hgb urine dipstick: NEGATIVE
Ketones, ur: NEGATIVE mg/dL
Nitrite: NEGATIVE
PH: 6 (ref 5.0–8.0)
PROTEIN: NEGATIVE mg/dL
SPECIFIC GRAVITY, URINE: 1.035 — AB (ref 1.005–1.030)
SQUAMOUS EPITHELIAL / LPF: NONE SEEN

## 2017-10-19 LAB — HEMOGLOBIN A1C
HEMOGLOBIN A1C: 4.7 % — AB (ref 4.8–5.6)
MEAN PLASMA GLUCOSE: 88.19 mg/dL

## 2017-10-19 LAB — LIPID PANEL
CHOL/HDL RATIO: 3.6 ratio
Cholesterol: 120 mg/dL (ref 0–200)
HDL: 33 mg/dL — ABNORMAL LOW (ref >40)
LDL CALC: 68 mg/dL (ref 0–99)
Triglycerides: 96 mg/dL (ref <150)
VLDL: 19 mg/dL (ref 0–40)

## 2017-10-19 LAB — TROPONIN I: Troponin I: 0.04 ng/mL (ref <0.03)

## 2017-10-19 MED ORDER — ALBUTEROL SULFATE (2.5 MG/3ML) 0.083% IN NEBU: 2.5000 mg | INHALATION_SOLUTION | RESPIRATORY_TRACT | Status: DC | PRN

## 2017-10-19 MED ORDER — NITROGLYCERIN 2 % TD OINT: 0.5000 [in_us] | TOPICAL_OINTMENT | Freq: Once | TRANSDERMAL | Status: DC

## 2017-10-19 MED ORDER — NICOTINE 14 MG/24HR TD PT24
14.0000 mg | MEDICATED_PATCH | Freq: Every day | TRANSDERMAL | Status: DC
  Filled 2017-10-19: qty 1

## 2017-10-19 MED ORDER — ENOXAPARIN SODIUM 40 MG/0.4ML ~~LOC~~ SOLN
40.0000 mg | SUBCUTANEOUS | Status: DC
  Administered 2017-10-19: 40 mg via SUBCUTANEOUS
  Filled 2017-10-19 (×2): qty 0.4

## 2017-10-19 MED ORDER — GABAPENTIN 300 MG PO CAPS
300.0000 mg | ORAL_CAPSULE | Freq: Three times a day (TID) | ORAL | Status: DC
  Administered 2017-10-19 – 2017-10-20 (×5): 300 mg via ORAL
  Filled 2017-10-19 (×5): qty 1

## 2017-10-19 MED ORDER — OLANZAPINE 10 MG PO TABS
20.0000 mg | ORAL_TABLET | Freq: Every day | ORAL | Status: DC
  Administered 2017-10-19 (×2): 20 mg via ORAL
  Filled 2017-10-19 (×3): qty 2

## 2017-10-19 MED ORDER — LISINOPRIL 10 MG PO TABS
10.0000 mg | ORAL_TABLET | Freq: Every day | ORAL | Status: DC
  Filled 2017-10-19: qty 1

## 2017-10-19 MED ORDER — ALPRAZOLAM 0.25 MG PO TABS: 0.2500 mg | ORAL_TABLET | Freq: Two times a day (BID) | ORAL | Status: DC | PRN

## 2017-10-19 MED ORDER — ENOXAPARIN SODIUM 40 MG/0.4ML ~~LOC~~ SOLN: 40.0000 mg | SUBCUTANEOUS | Status: DC

## 2017-10-19 MED ORDER — ASPIRIN 81 MG PO CHEW
324.0000 mg | CHEWABLE_TABLET | Freq: Once | ORAL | Status: AC
  Administered 2017-10-19: 324 mg via ORAL
  Filled 2017-10-19: qty 4

## 2017-10-19 MED ORDER — MIRTAZAPINE 7.5 MG PO TABS
15.0000 mg | ORAL_TABLET | Freq: Every day | ORAL | Status: DC
  Administered 2017-10-19 (×2): 15 mg via ORAL
  Filled 2017-10-19: qty 2
  Filled 2017-10-19: qty 1

## 2017-10-19 MED ORDER — SODIUM CHLORIDE 0.9 % IV SOLN: INTRAVENOUS | Status: DC

## 2017-10-19 MED ORDER — ACETAMINOPHEN 325 MG PO TABS: 650.0000 mg | ORAL_TABLET | ORAL | Status: DC | PRN

## 2017-10-19 MED ORDER — GI COCKTAIL ~~LOC~~
30.0000 mL | Freq: Four times a day (QID) | ORAL | Status: DC | PRN
  Administered 2017-10-19: 30 mL via ORAL
  Filled 2017-10-19: qty 30

## 2017-10-19 MED ORDER — MORPHINE SULFATE (PF) 4 MG/ML IV SOLN
1.0000 mg | INTRAVENOUS | Status: DC | PRN
  Administered 2017-10-19: 1 mg via INTRAVENOUS
  Filled 2017-10-19: qty 1

## 2017-10-19 MED ORDER — AMLODIPINE BESYLATE 10 MG PO TABS
10.0000 mg | ORAL_TABLET | Freq: Every day | ORAL | Status: DC
  Administered 2017-10-19 – 2017-10-20 (×2): 10 mg via ORAL
  Filled 2017-10-19: qty 2
  Filled 2017-10-19: qty 1

## 2017-10-19 MED ORDER — MORPHINE SULFATE (PF) 4 MG/ML IV SOLN: 4.0000 mg | INTRAVENOUS | Status: DC | PRN

## 2017-10-19 MED ORDER — HYDROXYZINE HCL 25 MG PO TABS
50.0000 mg | ORAL_TABLET | Freq: Three times a day (TID) | ORAL | Status: DC
  Administered 2017-10-19 – 2017-10-20 (×5): 50 mg via ORAL
  Filled 2017-10-19: qty 2
  Filled 2017-10-19: qty 1
  Filled 2017-10-19 (×3): qty 2

## 2017-10-19 MED ORDER — IPRATROPIUM-ALBUTEROL 0.5-2.5 (3) MG/3ML IN SOLN
3.0000 mL | Freq: Four times a day (QID) | RESPIRATORY_TRACT | Status: DC
  Administered 2017-10-20 (×3): 3 mL via RESPIRATORY_TRACT
  Filled 2017-10-19 (×4): qty 3

## 2017-10-19 MED ORDER — ASPIRIN 81 MG PO CHEW
162.0000 mg | CHEWABLE_TABLET | Freq: Every day | ORAL | Status: DC
  Administered 2017-10-19 – 2017-10-20 (×2): 162 mg via ORAL
  Filled 2017-10-19 (×2): qty 2

## 2017-10-19 MED ORDER — METOPROLOL TARTRATE 25 MG PO TABS: 12.5000 mg | ORAL_TABLET | Freq: Two times a day (BID) | ORAL | Status: DC

## 2017-10-19 MED ORDER — ONDANSETRON HCL 4 MG/2ML IJ SOLN: 4.0000 mg | Freq: Four times a day (QID) | INTRAMUSCULAR | Status: DC | PRN

## 2017-10-19 MED ORDER — SODIUM CHLORIDE 0.9 % IV SOLN
INTRAVENOUS | Status: DC
  Administered 2017-10-19: 12:00:00 via INTRAVENOUS
  Administered 2017-10-19: 100 mL/h via INTRAVENOUS

## 2017-10-19 MED ORDER — IPRATROPIUM-ALBUTEROL 0.5-2.5 (3) MG/3ML IN SOLN
3.0000 mL | RESPIRATORY_TRACT | Status: DC
  Administered 2017-10-19 (×2): 3 mL via RESPIRATORY_TRACT
  Filled 2017-10-19 (×2): qty 3

## 2017-10-19 MED ORDER — LISINOPRIL 10 MG PO TABS: 10.0000 mg | ORAL_TABLET | Freq: Every day | ORAL | Status: DC

## 2017-10-19 NOTE — ED Notes (Signed)
Family at bedside. 

## 2017-10-19 NOTE — ED Notes (Signed)
Dr. Eliseo Squires paged and notified pt is having active chest pain 5/10 nitro paste noted on right chest.

## 2017-10-19 NOTE — Progress Notes (Signed)
Pt for [employer] tomorrow

## 2017-10-19 NOTE — H&P (Signed)
History and Physical    Lance Cohen BOF:[id number] DOB: September 02, 1971 DOA: 10/18/2017   PCP: [facility]   Patient coming from:  Home    Chief Complaint: Chest pain   HPI: Lance Cohen is a 46 y.o. male with medical history significant for DVT and PE, status post IVC filter placement with removal in March 2018, and no longer on blood thinners, presenting to the emergency department with acute onset of chest pain around 7 PM, when smoking a cigarette.  He described it as left-sided, sharp, with associated diaphoresis, nausea without vomiting, shortness of breath, and dyspnea on exertion.  Patient took ASA 324 mg on arrival, and then given NTG with some relief.Denies any dizziness or falls. No syncope or presyncope.  Reports some acute on chronic dyspnea, worse on exertion. Denies any cough. Denies any fever or chills. Appetite is normal and eats salt rich foods. Denies any leg swelling or calf pain. Denies any headaches or vision changes. Denies any seizures No confusion reported Never seen by a cardiologist or had a cardiac cath .  No recent long distance trips. Denies any new stressors. No new meds. Not on hormonal therapy.  No new herbal supplements.Continues to smoke,. Denies heavy ETOH or recreational drug use.  Positive family history of MI in his father.   ED Course:  BP (!) 136/99   Pulse 66   Temp 99.1 F (37.3 C) (Oral)   Resp 12   Ht 5\' 11"  (1.803 m)   Wt 112.5 kg (248 lb)   SpO2 90%   BMI 34.59 kg/m   On presentation to the ED, the patient was tachycardic with a rate of 109. CT angio of the chest was performed, which was negative for PE, or acute findings. Chest x-ray was negative for acute cardiopulmonary disease EKG showed sinus tachycardia, without any changes since last tracing in 2012. Creatinine 1.63  White count 12.1 Hemoglobin 17.6 Troponin is negative Received nitroglycerin, morphine, aspirin without significant relief. Heart score is  4  Review of Systems:  As per HPI otherwise all other systems reviewed and are negative  Past Medical History:  Diagnosis Date  . Asthma   . Bipolar 1 disorder ([place])   . Chronic knee pain   . Hypertension   . Suicide attempt [facility])    2010 Intentional overdose attempt with Depakote.    Past Surgical History:  Procedure Laterality Date  . FOOT SURGERY     had peice of wire removed  . IVC FILTER REMOVAL N/A 02/24/2017   Procedure: IVC Filter Removal;  Surgeon: [name], MD;  Location: [facility];  Service: Cardiovascular;  Laterality: N/A;  . PERIPHERAL VASCULAR CATHETERIZATION Right 10/22/2016   Procedure: Thrombectomy, thrombolyisis;  Surgeon: [name], MD;  Location: [facility];  Service: Cardiovascular;  Laterality: Right;  . PERIPHERAL VASCULAR CATHETERIZATION N/A 10/22/2016   Procedure: IVC Filter Insertion;  Surgeon: [name], MD;  Location: [facility];  Service: Cardiovascular;  Laterality: N/A;    Social History Social History   Socioeconomic History  . Marital status: Married    Spouse name: Not on file  . Number of children: Not on file  . Years of education: Not on file  . Highest education level: Not on file  Social Needs  . Financial resource strain: Not on file  . Food insecurity - worry: Not on file  . Food insecurity - inability: Not on file  .  Transportation needs - medical: Not on file  . Transportation needs - non-medical: Not on file  Occupational History  . Not on file  Tobacco Use  . Smoking status: Current Every Day Smoker    Packs/day: 0.50  . Smokeless tobacco: Never Used  Substance and Sexual Activity  . Alcohol use: Yes    Comment: occasionally  . Drug use: No  . Sexual activity: Not on file  Other Topics Concern  . Not on file  Social History Narrative  . Not on file     Allergies  Allergen Reactions  . Bee Venom Swelling  . Penicillins Other (See Comments)    PT states that he had  symptoms that felt like he was having a heart attack Has patient had a PCN reaction causing immediate rash, facial/tongue/throat swelling, SOB or lightheadedness with hypotension: No Has patient had a PCN reaction causing severe rash involving mucus membranes or skin necrosis: No Has patient had a PCN reaction that required hospitalization No Has patient had a PCN reaction occurring within the last 10 years: No If all of the above answers are "NO", then may proceed with Cephal  . Sulfa Antibiotics Itching    Family History  Problem Relation Age of Onset  . Cancer Mother   . Heart attack Father       Prior to Admission medications   Medication Sig Start Date End Date Taking? Authorizing Provider  amLODipine (NORVASC) 10 MG tablet Take 10 mg by mouth daily.   Yes [provider]  gabapentin (NEURONTIN) 300 MG capsule Take 300 mg by mouth 3 (three) times daily.    Yes [provider]  hydrOXYzine (ATARAX/VISTARIL) 50 MG tablet Take 50 mg by mouth 3 (three) times daily.   Yes [provider]  lisinopril (PRINIVIL,ZESTRIL) 10 MG tablet Take 10 mg by mouth daily.   Yes [provider]  mirtazapine (REMERON) 15 MG tablet Take 15 mg by mouth at bedtime.   Yes [provider]  OLANZapine (ZYPREXA) 20 MG tablet Take 20 mg by mouth at bedtime.   Yes [provider]  apixaban (ELIQUIS) 5 MG TABS tablet Take 1 tablet (5 mg total) by mouth 2 (two) times daily. Patient not taking: Reported on 10/18/2017 10/30/16   Theodoro Grist, MD    Physical Exam:  Vitals:   10/19/17 0300 10/19/17 0400 10/19/17 0500 10/19/17 0600  BP: 122/75 111/70 132/90 (!) 136/99  Pulse: 73 66 70 66  Resp: 13 16 13 12   Temp:      TempSrc:      SpO2: 92% 93% 91% 90%  Weight:      Height:       Constitutional: NAD, calm, appearing.  Eyes: PERRL, lids and conjunctivae normal ENMT: Mucous membranes are moist, without exudate or lesions  Neck: normal, supple, no  masses, no thyromegaly Respiratory: clear to auscultation bilaterally, but expiratory wheezing is noted.  no crackles or rhonchi. Normal respiratory effort  Cardiovascular: Regular rate and rhythm,  murmur, rubs or gallops. No extremity edema. 2+ pedal pulses. No carotid bruits.  Abdomen: Soft, non tender, No hepatosplenomegaly. Bowel sounds positive.  Moderately obese Musculoskeletal: no clubbing / cyanosis. Moves all extremities Skin: no jaundice, No lesions. Flushed fascies  Neurologic: Sensation intact  Strength equal in all extremities Psychiatric:   Alert and oriented x 3.  Anxious    Labs on Admission: I have personally reviewed following labs and imaging studies  CBC: Recent Labs  Lab 10/18/17 1940  WBC 12.1*  HGB 17.6*  HCT 48.7  MCV 99.0  PLT 233    Basic Metabolic Panel: Recent Labs  Lab 10/18/17 1940  NA 136  K 3.6  CL 100*  CO2 22  GLUCOSE 88  BUN 10  CREATININE 1.63*  CALCIUM 9.2    GFR: Estimated Creatinine Clearance: 73 mL/min (A) (by C-G formula based on SCr of 1.63 mg/dL (H)).  Liver Function Tests: No results for input(s): AST, ALT, ALKPHOS, BILITOT, PROT, ALBUMIN in the last 168 hours. No results for input(s): LIPASE, AMYLASE in the last 168 hours. No results for input(s): AMMONIA in the last 168 hours.  Coagulation Profile: No results for input(s): INR, PROTIME in the last 168 hours.  Cardiac Enzymes: Recent Labs  Lab 10/19/17 0440  TROPONINI <0.03    BNP (last 3 results) No results for input(s): PROBNP in the last 8760 hours.  HbA1C: No results for input(s): HGBA1C in the last 72 hours.  CBG: No results for input(s): GLUCAP in the last 168 hours.  Lipid Profile: No results for input(s): CHOL, HDL, LDLCALC, TRIG, CHOLHDL, LDLDIRECT in the last 72 hours.  Thyroid Function Tests: No results for input(s): TSH, T4TOTAL, FREET4, T3FREE, THYROIDAB in the last 72 hours.  Anemia Panel: No results for input(s): VITAMINB12, FOLATE,  FERRITIN, TIBC, IRON, RETICCTPCT in the last 72 hours.  Urine analysis:    Component Value Date/Time   COLORURINE YELLOW 01/29/2017 0100   APPEARANCEUR CLEAR 01/29/2017 0100   LABSPEC 1.005 01/29/2017 0100   PHURINE 6.0 01/29/2017 0100   GLUCOSEU NEGATIVE 01/29/2017 0100   HGBUR NEGATIVE 01/29/2017 0100   BILIRUBINUR NEGATIVE 01/29/2017 0100   KETONESUR NEGATIVE 01/29/2017 0100   PROTEINUR NEGATIVE 01/29/2017 0100   UROBILINOGEN 0.2 12/08/2013 1735   NITRITE NEGATIVE 01/29/2017 0100   LEUKOCYTESUR SMALL (A) 01/29/2017 0100    Sepsis Labs: @LABRCNTIP (procalcitonin:4,lacticidven:4) )No results found for this or any previous visit (from the past 240 hour(s)).   Radiological Exams on Admission: Dg Chest 2 View  Result Date: 10/18/2017 CLINICAL DATA:  46 year old male with chest pain. EXAM: CHEST  2 VIEW COMPARISON:  Chest CT dated 10/18/2016 FINDINGS: The heart size and mediastinal contours are within normal limits. Both lungs are clear. The visualized skeletal structures are unremarkable. IMPRESSION: No active cardiopulmonary disease. Electronically Signed   By: Anner Crete M.D.   On: 10/18/2017 20:33   Ct Angio Chest Pe W/cm &/or Wo Cm  Result Date: 10/18/2017 CLINICAL DATA:  Acute onset of generalized chest pain. Personal history of pulmonary embolus. EXAM: CT ANGIOGRAPHY CHEST WITH CONTRAST TECHNIQUE: Multidetector CT imaging of the chest was performed using the standard protocol during bolus administration of intravenous contrast. Multiplanar CT image reconstructions and MIPs were obtained to evaluate the vascular anatomy. CONTRAST:  11mL ISOVUE-370 IOPAMIDOL (ISOVUE-370) INJECTION 76% COMPARISON:  Chest radiograph performed earlier today at 8:05 p.m., and CTA of the chest performed 10/18/2016 FINDINGS: Cardiovascular:  There is no evidence of pulmonary embolus. The heart is normal in size. The thoracic aorta is grossly unremarkable. The great vessels are within normal limits.  Mediastinum/Nodes: The mediastinum is unremarkable in appearance. No mediastinal lymphadenopathy is seen. No pericardial effusion is identified. The visualized portions of the thyroid gland are unremarkable. No axillary lymphadenopathy is seen. There is slight focal prominence of fat along the posterior aspect of the trachea, without tracheomalacia. Lungs/Pleura: Minimal bibasilar atelectasis is noted. No pleural effusion or pneumothorax is seen. No masses are identified. Upper Abdomen: The visualized portions of the liver  and spleen are unremarkable. The visualized portions of the gallbladder, pancreas, adrenal glands and left kidney are grossly unremarkable. Musculoskeletal: No acute osseous abnormalities are identified. The visualized musculature is unremarkable in appearance. Review of the MIP images confirms the above findings. IMPRESSION: No evidence of pulmonary embolus.  Lungs essentially clear. Electronically Signed   By: Garald Balding M.D.   On: 10/18/2017 22:10    EKG: Independently reviewed.  Assessment/Plan Active Problems:   Chest pain   Bipolar 1 disorder ([facility])   Tobacco abuse   Polycythemia   DVT (deep venous thrombosis) ([facility])   Acute pulmonary embolism ([facility])   S/P IVC filter   Asthma   Hypertension   Chronic knee pain      Chest pain syndrome in a patient with a h/o of PE/DVT (not on filter or anticoagulants) On presentation to the ED, the patient was tachycardic with a rate of 109. CT angio of the chest was performed, which was negative for PE, or acute findings.Chest x-ray was negative for acute cardiopulmonary disease.EKG showed sinus tachycardia, without any changes since last tracing in 2012, but rate was better controlled once home meds resumed.  HEART score 4 . Troponin neg  CP not significally relieved by nitroglycerin, morphine, aspirin  Risk factors include tobacco abuse, history of PE and DVT, family history of heart attack, hypertension, obesity, polycythemia   Admit  to Telemetry/ Observation 6 [facility]  Chest pain order set Cycle troponins EKG in am Stress test in am . Will place order for NPO after midnight  continue ASA, O2 and NTG as needed GI cocktail Check Lipid panel  Hb A1C     Leukocytosis and hemoglobinemia , likely reactive, related to underlying polycythemia WBC 12, Hb 17   CXR NAD Afebrile, VSS    IVF   Repeat CBC in AM   Hypertension,  BP   142/94   Pulse 62    Continue home anti-hypertensive medications with Norvasc, and lisinopril  Depression, Anxiety, Bipolar disorder  Continue home Zyprexa, Remeron, Neurontin and Xanax  History of tobacco abuse, possible underlying asthma, with wheezing on exam DuoNeb every 4 hours, albuterol every 2 hours as needed for wheezing, Start Nicotine patch  40 mg every 24 hours Tobacco cessation counseled    Acute Kidney Injury likely due to dehydration vs.ACEI     BL Cr  1.1 current 1.63  Lab Results  Component Value Date   CREATININE 1.63 (H) 10/18/2017   CREATININE 1.18 12/10/2016   CREATININE 1.11 10/22/2016  IVF  Check UA  CMET in am   Hold Ace inhibitors today, resume tomorrow    Chronic knee pain, in the setting of polycythemia. No acute issues  Continue pain control prn   DVT prophylaxis: Lovenox Code Status:    Full Family Communication:  Discussed with patient Disposition Plan: Expect patient to be discharged to home after condition improves Consults called:    None.  Will be seen by cardiology in a.m., upon stress test Admission status: Telemetry   Sharene Butters, PA-C [facility]   10/19/2017, 6:48 AM

## 2017-10-19 NOTE — ED Notes (Signed)
Pt transferred to a hospital bed

## 2017-10-19 NOTE — ED Notes (Signed)
Patient is resting comfortably. 

## 2017-10-19 NOTE — Consult Note (Signed)
Cardiology Consultation:   Patient ID: Lance Cohen; [id number]; 12-May-1971   Admit date: 10/18/2017 Date of Consult: 10/19/2017  Primary Care Provider: [facility], [facility] Primary Cardiologist: No primary care provider on file. new Primary Electrophysiologist:  NA  Patient Profile:   Lance Cohen is a 46 y.o. 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 Eliseo Squires.  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    In ER troponin is neg   + FH CAD father died in his 69s with MI.   EKG:  The EKG was personally reviewed and demonstrates:  SR and no changes from 02/2017   Cr is 1.63, Na 136, K+ 3.6 WBC 12.1, Hgb 17.6    CTA of chest neg for PE 2V CXR clear  Currently may have mild discomfort.  The pain was worse with deep breath last night and today.  He understands he should stop smoking.  He just had neb treatment.  He does tell me after IVC filter removed his HR went to 140 and he may have heard atrial fib.  I do not find this in notes from that time or when IVC was placed.  Denies awareness of rapid HR.    Past Medical History:  Diagnosis Date  . Asthma   . Bipolar 1 disorder ([place])   . Chronic knee pain   . Hypertension   . Suicide attempt [facility])    2010 Intentional overdose attempt with Depakote.    Past Surgical History:  Procedure Laterality Date  . FOOT SURGERY     had peice of wire removed  . IVC FILTER REMOVAL N/A 02/24/2017   Procedure: IVC Filter Removal;  Surgeon: [name], MD;  Location: [place] [facility];  Service: Cardiovascular;  Laterality: N/A;  . PERIPHERAL VASCULAR CATHETERIZATION Right 10/22/2016   Procedure: Thrombectomy,  thrombolyisis;  Surgeon: [name], MD;  Location: [place] [facility];  Service: Cardiovascular;  Laterality: Right;  . PERIPHERAL VASCULAR CATHETERIZATION N/A 10/22/2016   Procedure: IVC Filter Insertion;  Surgeon: [name], MD;  Location: [place] [facility];  Service: Cardiovascular;  Laterality: N/A;     Home Medications:  Prior to Admission medications   Medication Sig Start Date End Date Taking? Authorizing Provider  amLODipine (NORVASC) 10 MG tablet Take 10 mg by mouth daily.   Yes [provider]  gabapentin (NEURONTIN) 300 MG capsule Take 300 mg by mouth 3 (three) times daily.    Yes [provider]  hydrOXYzine (ATARAX/VISTARIL) 50 MG tablet Take 50 mg by mouth 3 (three) times daily.   Yes [provider]  lisinopril (PRINIVIL,ZESTRIL) 10 MG tablet Take 10 mg by mouth daily.   Yes [provider]  mirtazapine (REMERON) 15 MG tablet Take 15 mg by mouth at bedtime.   Yes [provider]  OLANZapine (ZYPREXA) 20 MG tablet Take 20 mg by mouth at bedtime.   Yes [provider]  apixaban (ELIQUIS) 5 MG TABS tablet Take 1 tablet (5 mg total) by mouth 2 (two) times daily. Patient not taking: Reported on 10/18/2017 10/30/16   Theodoro Grist, MD  PT  was not taking Eliquis was stopped 03/2017  Inpatient Medications: Scheduled Meds: . amLODipine  10 mg Oral Daily  . aspirin  162 mg Oral Daily  . enoxaparin (LOVENOX) injection  40 mg Subcutaneous Q24H  . gabapentin  300 mg Oral TID  . hydrOXYzine  50 mg Oral TID  . ipratropium-albuterol  3 mL Nebulization Q4H  . [START ON 10/20/2017] lisinopril  10 mg Oral Daily  . mirtazapine  15 mg Oral QHS  . nicotine  14 mg Transdermal Daily  . nitroGLYCERIN  0.5 inch Topical Once  . OLANZapine  20 mg Oral QHS   Continuous Infusions: . sodium chloride     PRN Meds: acetaminophen, albuterol, ALPRAZolam, gi cocktail, morphine injection, ondansetron (ZOFRAN) IV  Allergies:      Allergies  Allergen Reactions  . Bee Venom Swelling  . Penicillins Other (See Comments)    PT states that he had symptoms that felt like he was having a heart attack Has patient had a PCN reaction causing immediate rash, facial/tongue/throat swelling, SOB or lightheadedness with hypotension: No Has patient had a PCN reaction causing severe rash involving mucus membranes or skin necrosis: No Has patient had a PCN reaction that required hospitalization No Has patient had a PCN reaction occurring within the last 10 years: No If all of the above answers are "NO", then may proceed with Cephal  . Sulfa Antibiotics Itching    Social History:   Social History   Socioeconomic History  . Marital status: Married    Spouse name: Not on file  . Number of children: Not on file  . Years of education: Not on file  . Highest education level: Not on file  Social Needs  . Financial resource strain: Not on file  . Food insecurity - worry: Not on file  . Food insecurity - inability: Not on file  . Transportation needs - medical: Not on file  . Transportation needs - non-medical: Not on file  Occupational History  . Not on file  Tobacco Use  . Smoking status: Current Every Day Smoker    Packs/day: 0.50  . Smokeless tobacco: Never Used  Substance and Sexual Activity  . Alcohol use: Yes    Comment: occasionally  . Drug use: No  . Sexual activity: Yes  Other Topics Concern  . Not on file  Social History Narrative  . Not on file    Family History:    Family History  Problem Relation Age of Onset  . Cancer Mother   . Heart attack Father      ROS:  Please see the history of present illness.  ROS  All other ROS reviewed and negative.   General:no colds or fevers, no weight changes Skin:no rashes or ulcers HEENT:no blurred vision, no congestion CV:see HPI PUL:see HPI GI:no diarrhea constipation or melena, no indigestion GU:no hematuria, no dysuria MS:no joint pain, no  claudication Neuro:no syncope, no lightheadedness Endo:no diabetes, no thyroid disease    Physical Exam/Data:   Vitals:   10/19/17 0828 10/19/17 0900 10/19/17 1000 10/19/17 1030  BP:  139/85 (!) 152/93   Pulse:  71 64   Resp:  13 14   Temp:      TempSrc:      SpO2: 96% 96%  95%  Weight:      Height:       No intake or output data in the 24 hours ending 10/19/17 1124 Filed Weights   10/19/17 0100  Weight: 248 lb (112.5  kg)   Body mass index is 34.59 kg/m.  General:  Well nourished, well developed, in no acute distress HEENT: normal Lymph: no adenopathy Neck: no JVD Endocrine:  No thryomegaly Vascular: No carotid bruits; 2+ pedal pulses bil.   Cardiac:  normal S1, S2; RRR; no murmur gallup rub or click Lungs:  clear to auscultation bilaterally, no wheezing, rhonchi or rales  Abd: soft, nontender, no hepatomegaly  Ext: no edema Musculoskeletal:  No deformities, BUE and BLE strength normal and equal Skin: warm and dry  Neuro:  Alert and oriented X 3 MAE follows commands, + facial symmetry  PSYCH: normal affect  Relevant CV Studies: none  Laboratory Data:  Chemistry Recent Labs  Lab 10/18/17 1940  NA 136  K 3.6  CL 100*  CO2 22  GLUCOSE 88  BUN 10  CREATININE 1.63*  CALCIUM 9.2  GFRNONAA 49*  GFRAA 57*  ANIONGAP 14    No results for input(s): PROT, ALBUMIN, AST, ALT, ALKPHOS, BILITOT in the last 168 hours. Hematology Recent Labs  Lab 10/18/17 1940  WBC 12.1*  RBC 4.92  HGB 17.6*  HCT 48.7  MCV 99.0  MCH 35.8*  MCHC 36.1*  RDW 14.0  PLT 312   Cardiac Enzymes Recent Labs  Lab 10/19/17 0440  TROPONINI <0.03    Recent Labs  Lab 10/18/17 1957 10/18/17 2317  TROPIPOC 0.00 0.00    BNPNo results for input(s): BNP, PROBNP in the last 168 hours.  DDimer No results for input(s): DDIMER in the last 168 hours.  Radiology/Studies:  Dg Chest 2 View  Result Date: 10/18/2017 CLINICAL DATA:  45 year old male with chest pain. EXAM: CHEST  2 VIEW  COMPARISON:  Chest CT dated 10/18/2016 FINDINGS: The heart size and mediastinal contours are within normal limits. Both lungs are clear. The visualized skeletal structures are unremarkable. IMPRESSION: No active cardiopulmonary disease. Electronically Signed   By: Anner Crete M.D.   On: 10/18/2017 20:33   Ct Angio Chest Pe W/cm &/or Wo Cm  Result Date: 10/18/2017 CLINICAL DATA:  Acute onset of generalized chest pain. Personal history of pulmonary embolus. EXAM: CT ANGIOGRAPHY CHEST WITH CONTRAST TECHNIQUE: Multidetector CT imaging of the chest was performed using the standard protocol during bolus administration of intravenous contrast. Multiplanar CT image reconstructions and MIPs were obtained to evaluate the vascular anatomy. CONTRAST:  134mL ISOVUE-370 IOPAMIDOL (ISOVUE-370) INJECTION 76% COMPARISON:  Chest radiograph performed earlier today at 8:05 p.m., and CTA of the chest performed 10/18/2016 FINDINGS: Cardiovascular:  There is no evidence of pulmonary embolus. The heart is normal in size. The thoracic aorta is grossly unremarkable. The great vessels are within normal limits. Mediastinum/Nodes: The mediastinum is unremarkable in appearance. No mediastinal lymphadenopathy is seen. No pericardial effusion is identified. The visualized portions of the thyroid gland are unremarkable. No axillary lymphadenopathy is seen. There is slight focal prominence of fat along the posterior aspect of the trachea, without tracheomalacia. Lungs/Pleura: Minimal bibasilar atelectasis is noted. No pleural effusion or pneumothorax is seen. No masses are identified. Upper Abdomen: The visualized portions of the liver and spleen are unremarkable. The visualized portions of the gallbladder, pancreas, adrenal glands and left kidney are grossly unremarkable. Musculoskeletal: No acute osseous abnormalities are identified. The visualized musculature is unremarkable in appearance. Review of the MIP images confirms the above  findings. IMPRESSION: No evidence of pulmonary embolus.  Lungs essentially clear. Electronically Signed   By: Garald Balding M.D.   On: 10/18/2017 22:10    Assessment and Plan:  1. Chest pain , increases with deep breath, and he did have wheezes earlier may be muscular skeletal but with FH of Premature CAD he should have evaluation even with neg troponin.  Cardiac CT is appropriate but with elevated cr will hold ACE and give IV fluids overnight will order CTA is Cr stable in AM.  CTA of chest -neg for PE-.  Dr. Wynonia Lawman to see.  LDL today is 68 HDL is low 2. HTN controlled 3. Tobacco use, discussed importance of stopping 4. Hx of Bipolar disorder   For questions or updates, please contact [place] Please consult [email or web address] for contact info under Cardiology/STEMI.   Signed, Cecilie Kicks, NP  10/19/2017 11:24 AM  Patient seen and examined.  Complete history reviewed.  Does have significant history of DVT.  Has also significant psychiatric history and is currently on disability.  Chest discomfort severe like someone stabbing him started last evening and lasted until he received intravenous pain medications.  Significant family history of cardiac disease and multiple risk factors including low HDL, hypertension, and family history.  Currently pain-free.  Had negative CTA.  Creatinine was mildly elevated.  Other than some missing teeth and mild obesity his examination is completely normal.  Twelve-lead EKG personally reviewed by me is normal.  Impression:   1.  Chest discomfort with some typical other atypical features occurring at rest in a patient with multiple risk factors and family history 2.  Hypertension controlled 3.  History of DVT with removal as well as IVC filter 4.  Dyslipidemia with low HDL 5.  Family history of cardiac disease 6.  Polycythemia  Recommendations:  Discussed with patient.  In light of family history of multiple risks would prefer in light of psych  history and ongoing pain in the past to go ahead and get a cardiac CTA by which we can get a calcium score and have a definitive diagnosis.  Because of renal insufficiency noted today we will hydrate overnight, hold lisinopril and recheck creatinine in the morning.  If stable get cardiac CTA.  Kerry Hough MD [facility] 12:35 PM

## 2017-10-20 ENCOUNTER — Inpatient Hospital Stay (HOSPITAL_COMMUNITY): Payer: Medicaid Other

## 2017-10-20 ENCOUNTER — Other Ambulatory Visit: Payer: Self-pay

## 2017-10-20 DIAGNOSIS — R0789 Other chest pain: Secondary | ICD-10-CM

## 2017-10-20 DIAGNOSIS — R072 Precordial pain: Secondary | ICD-10-CM

## 2017-10-20 LAB — BASIC METABOLIC PANEL
ANION GAP: 7 (ref 5–15)
BUN: 8 mg/dL (ref 6–20)
CALCIUM: 8.2 mg/dL — AB (ref 8.9–10.3)
CO2: 25 mmol/L (ref 22–32)
Chloride: 106 mmol/L (ref 101–111)
Creatinine, Ser: 1.48 mg/dL — ABNORMAL HIGH (ref 0.61–1.24)
GFR calc Af Amer: >60 mL/min (ref >60)
GFR calc non Af Amer: 56 mL/min — ABNORMAL LOW (ref >60)
GLUCOSE: 94 mg/dL (ref 65–99)
Potassium: 3.6 mmol/L (ref 3.5–5.1)
Sodium: 138 mmol/L (ref 135–145)

## 2017-10-20 LAB — RAPID URINE DRUG SCREEN, HOSP PERFORMED
Amphetamines: NOT DETECTED
BARBITURATES: NOT DETECTED
BENZODIAZEPINES: NOT DETECTED
Cocaine: NOT DETECTED
Opiates: NOT DETECTED
Tetrahydrocannabinol: POSITIVE — AB

## 2017-10-20 MED ORDER — METOPROLOL TARTRATE 5 MG/5ML IV SOLN
INTRAVENOUS | Status: DC
Start: 2017-10-20 — End: 2017-10-20
  Filled 2017-10-20: qty 5

## 2017-10-20 MED ORDER — IOPAMIDOL (ISOVUE-370) INJECTION 76%
INTRAVENOUS | Status: AC
  Administered 2017-10-20: 100 mL
  Filled 2017-10-20: qty 100

## 2017-10-20 MED ORDER — NITROGLYCERIN 0.4 MG SL SUBL
SUBLINGUAL_TABLET | SUBLINGUAL | Status: AC
  Filled 2017-10-20: qty 2

## 2017-10-20 NOTE — Progress Notes (Signed)
   Coronary CTA showed a coronary calcium score of 15 which is in the 89th percentile for age and sex. He had only minimal non-obstructive plaque in the LAD with associated stenosis 0-25%. Aggressive medical management is recommended.  He can be discharged from cardiology standpoint, per Dr. Angelena Form.   Daune Perch, AGNP-C [facility] 10/20/2017  3:45 PM Pager: [phone]

## 2017-10-20 NOTE — Progress Notes (Signed)
Progress Note  Patient Name: Lance Cohen Date of Encounter: 10/20/2017  Primary Cardiologist: New  Subjective   No chest pain this am.   Inpatient Medications    Scheduled Meds: . amLODipine  10 mg Oral Daily  . aspirin  162 mg Oral Daily  . enoxaparin (LOVENOX) injection  40 mg Subcutaneous Q24H  . gabapentin  300 mg Oral TID  . hydrOXYzine  50 mg Oral TID  . ipratropium-albuterol  3 mL Nebulization Q6H  . mirtazapine  15 mg Oral QHS  . nicotine  14 mg Transdermal Daily  . nitroGLYCERIN  0.5 inch Topical Once  . OLANZapine  20 mg Oral QHS   Continuous Infusions: . sodium chloride 100 mL/hr (10/19/17 2138)  . sodium chloride Stopped (10/19/17 1300)   PRN Meds: acetaminophen, albuterol, ALPRAZolam, gi cocktail, morphine injection, ondansetron (ZOFRAN) IV   Vital Signs    Vitals:   10/19/17 1557 10/19/17 2021 10/20/17 0342 10/20/17 0500  BP: (!) 141/72 134/72  115/64  Pulse: 71 75  62  Resp: 18 18  18   Temp: 97.8 F (36.6 C) 98.6 F (37 C)  98.3 F (36.8 C)  TempSrc: Oral Oral  Oral  SpO2: 98% 95% 96% 92%  Weight: 255 lb 4.7 oz (115.8 kg)   254 lb 6.6 oz (115.4 kg)  Height: 5\' 11"  (1.803 m)       Intake/Output Summary (Last 24 hours) at 10/20/2017 0808 Last data filed at 10/20/2017 0748 Gross per 24 hour  Intake 1798.33 ml  Output -  Net 1798.33 ml   Filed Weights   10/19/17 0100 10/19/17 1557 10/20/17 0500  Weight: 248 lb (112.5 kg) 255 lb 4.7 oz (115.8 kg) 254 lb 6.6 oz (115.4 kg)    Telemetry    Sinus - Personally Reviewed  ECG      Physical Exam   GEN: No acute distress.   Neck: No JVD Cardiac: RRR, no murmurs, rubs, or gallops.  Respiratory: Clear to auscultation bilaterally. GI: Soft, nontender, non-distended  MS: No edema; No deformity. Neuro:  Nonfocal  Psych: Normal affect   Labs    Chemistry Recent Labs  Lab 10/18/17 1940 10/20/17 0435  NA 136 138  K 3.6 3.6  CL 100* 106  CO2 22 25  GLUCOSE 88 94  BUN 10 8    CREATININE 1.63* 1.48*  CALCIUM 9.2 8.2*  GFRNONAA 49* 56*  GFRAA 57* >60  ANIONGAP 14 7     Hematology Recent Labs  Lab 10/18/17 1940  WBC 12.1*  RBC 4.92  HGB 17.6*  HCT 48.7  MCV 99.0  MCH 35.8*  MCHC 36.1*  RDW 14.0  PLT 312    Cardiac Enzymes Recent Labs  Lab 10/19/17 0440 10/19/17 1029  TROPONINI <0.03 0.04*    Recent Labs  Lab 10/18/17 1957 10/18/17 2317  TROPIPOC 0.00 0.00     BNPNo results for input(s): BNP, PROBNP in the last 168 hours.   DDimer No results for input(s): DDIMER in the last 168 hours.   Radiology    Dg Chest 2 View  Result Date: 10/18/2017 CLINICAL DATA:  46 year old male with chest pain. EXAM: CHEST  2 VIEW COMPARISON:  Chest CT dated 10/18/2016 FINDINGS: The heart size and mediastinal contours are within normal limits. Both lungs are clear. The visualized skeletal structures are unremarkable. IMPRESSION: No active cardiopulmonary disease. Electronically Signed   By: Anner Crete M.D.   On: 10/18/2017 20:33   Ct Angio Chest Pe W/cm &/or  Wo Cm  Result Date: 10/18/2017 CLINICAL DATA:  Acute onset of generalized chest pain. Personal history of pulmonary embolus. EXAM: CT ANGIOGRAPHY CHEST WITH CONTRAST TECHNIQUE: Multidetector CT imaging of the chest was performed using the standard protocol during bolus administration of intravenous contrast. Multiplanar CT image reconstructions and MIPs were obtained to evaluate the vascular anatomy. CONTRAST:  151mL ISOVUE-370 IOPAMIDOL (ISOVUE-370) INJECTION 76% COMPARISON:  Chest radiograph performed earlier today at 8:05 p.m., and CTA of the chest performed 10/18/2016 FINDINGS: Cardiovascular:  There is no evidence of pulmonary embolus. The heart is normal in size. The thoracic aorta is grossly unremarkable. The great vessels are within normal limits. Mediastinum/Nodes: The mediastinum is unremarkable in appearance. No mediastinal lymphadenopathy is seen. No pericardial effusion is identified. The  visualized portions of the thyroid gland are unremarkable. No axillary lymphadenopathy is seen. There is slight focal prominence of fat along the posterior aspect of the trachea, without tracheomalacia. Lungs/Pleura: Minimal bibasilar atelectasis is noted. No pleural effusion or pneumothorax is seen. No masses are identified. Upper Abdomen: The visualized portions of the liver and spleen are unremarkable. The visualized portions of the gallbladder, pancreas, adrenal glands and left kidney are grossly unremarkable. Musculoskeletal: No acute osseous abnormalities are identified. The visualized musculature is unremarkable in appearance. Review of the MIP images confirms the above findings. IMPRESSION: No evidence of pulmonary embolus.  Lungs essentially clear. Electronically Signed   By: Garald Balding M.D.   On: 10/18/2017 22:10    Cardiac Studies    Patient Profile     46 y.o. male with history of bipolar disorder, prior DVT/PE admitted with chest pain. Troponin has been negative.   Assessment & Plan    1. Chest pain: Pt with chest pain, worsened with deep inspiration and improved with narcotic medications. Risk factors for CAD include HTN, tobacco abuse and FH of CAD.  Troponin has been negative. Plans in place for cardiac CTA today if renal function is stable. His creatinine is improved today. Will arrange cardiac/coronary CTA to exclude CAD. Further plans following CTA.   For questions or updates, please contact [place] Please consult [email or web address] for contact info under Cardiology/STEMI.      Signed, Lauree Chandler, MD  10/20/2017, 8:08 AM

## 2017-10-20 NOTE — Discharge Summary (Signed)
Discharge Summary  Lance Cohen:[id number] DOB: 11-01-1970  PCP: Sand[facility] Manly [place] date: 73/53/2992 Discharge date: 10/20/2017  Time spent: <95mins  Recommendations for Outpatient Follow-up:  1. F/u with PMD within a week  for hospital discharge follow up, repeat cbc/bmp at follow up  Discharge Diagnoses:  Active Hospital Problems   Diagnosis Date Noted  . Chest pain 10/19/2017  . Asthma 10/19/2017  . Hypertension 10/19/2017  . Chronic knee pain 10/19/2017  . Acute pulmonary embolism ([place]) 10/23/2016  . S/P IVC filter 10/23/2016  . DVT (deep venous thrombosis) ([place]) 10/18/2016  . Bipolar 1 disorder ([place]) 06/24/2011  . Tobacco abuse 06/24/2011  . Polycythemia 06/24/2011    Resolved Hospital Problems  No resolved problems to display.    Discharge Condition: stable  Diet recommendation: heart healthy  Filed Weights   10/19/17 0100 10/19/17 1557 10/20/17 0500  Weight: 112.5 kg (248 lb) 115.8 kg (255 lb 4.7 oz) 115.4 kg (254 lb 6.6 oz)    History of present illness:  [facility]P: [facility] [place]   Patient coming from:  Home    Chief Complaint: Chest pain   HPI: Lance Cohen is a 46 y.o. male with medical history significant for DVT and PE, status post IVC filter placement with removal in March 2018, and no longer on blood thinners, presenting to the emergency department with acute onset of chest pain around 7 PM, when smoking a cigarette.  He described it as left-sided, sharp, with associated diaphoresis, nausea without vomiting, shortness of breath, and dyspnea on exertion.  Patient took ASA 324 mg on arrival, and then given NTG with some relief.Denies any dizziness or falls. No syncope or presyncope.  Reports some acute on chronic dyspnea, worse on exertion. Denies any cough. Denies any fever or chills. Appetite is normal and eats salt rich foods. Denies any leg swelling or calf pain. Denies any headaches or vision changes.  Denies any seizures No confusion reported Never seen by a cardiologist or had a cardiac cath .  No recent long distance trips. Denies any new stressors. No new meds. Not on hormonal therapy.  No new herbal supplements.Continues to smoke,. Denies heavy ETOH or recreational drug use.  Positive family history of MI in his father.   ED Course:  BP (!) 136/99   Pulse 66   Temp 99.1 F (37.3 C) (Oral)   Resp 12   Ht 5\' 11"  (1.803 m)   Wt 112.5 kg (248 lb)   SpO2 90%   BMI 34.59 kg/m   On presentation to the ED, the patient was tachycardic with a rate of 109. CT angio of the chest was performed, which was negative for PE, or acute findings. Chest x-ray was negative for acute cardiopulmonary disease EKG showed sinus tachycardia, without any changes since last tracing in 2012. Creatinine 1.63  White count 12.1 Hemoglobin 17.6 Troponin is negative Received nitroglycerin, morphine, aspirin without significant relief. Heart score is 4    Hospital Course:  Active Problems:   Bipolar 1 disorder (HCC)   Tobacco abuse   Polycythemia   DVT (deep venous thrombosis) (HCC)   Acute pulmonary embolism (HCC)   S/P IVC filter   Chest pain   Asthma   Hypertension   Chronic knee pain  Chest pain with h/o DVT/PE CTA negative for PE.  ldl 68 Cardiology consulted, he has a lower risk cardiac cta, cardiology cleared to discharge home.  AKI on CKDII: ua no bacteria, no urinary  symptom Cr 1.63 on presentation, cr improved at discharge, hold lisinopril.  pmd follow up to repeat bmp.  HTN: stable on norvasc. Hold lisinopril due to aki. pmd to monitor bp and continue to adjust bp meds at hospital discharge follow up.  H/o asthma: no wheezing, no cough at discharge. cxr no acute findings.  H/o bipolar disorder: stable on home meds.  H/o polysubstance abuse: including alcohol/cigarette, marijuana. Education provided.  Code Status:    Full Family Communication:  Discussed with patient and wife  at bedside Disposition Plan:  home    Procedures:  cardiac CTA  Consultations:  cardiology  Discharge Exam: BP 118/69 (BP Location: Right Arm)   Pulse 62   Temp 98.4 F (36.9 C) (Oral)   Resp 18   Ht 5\' 11"  (1.803 m)   Wt 115.4 kg (254 lb 6.6 oz)   SpO2 96%   BMI 35.48 kg/m   General: NAD Cardiovascular: RRR Respiratory: CTABL  Discharge Instructions You were cared for by a hospitalist during your hospital stay. If you have any questions about your discharge medications or the care you received while you were in the hospital after you are discharged, you can call the unit and asked to speak with the hospitalist on call if the hospitalist that took care of you is not available. Once you are discharged, your primary care physician will handle any further medical issues. Please note that NO REFILLS for any discharge medications will be authorized once you are discharged, as it is imperative that you return to your primary care physician (or establish a relationship with a primary care physician if you do not have one) for your aftercare needs so that they can reassess your need for medications and monitor your lab values.  Discharge Instructions    Diet - low sodium heart healthy   Complete by:  As directed    Increase activity slowly   Complete by:  As directed      Allergies as of 10/20/2017      Reactions   Bee Venom Swelling   Penicillins Other (See Comments)   PT states that he had symptoms that felt like he was having a heart attack Has patient had a PCN reaction causing immediate rash, facial/tongue/throat swelling, SOB or lightheadedness with hypotension: No Has patient had a PCN reaction causing severe rash involving mucus membranes or skin necrosis: No Has patient had a PCN reaction that required hospitalization No Has patient had a PCN reaction occurring within the last 10 years: No If all of the above answers are "NO", then may proceed with Cephal   Sulfa  Antibiotics Itching      Medication List    STOP taking these medications   apixaban 5 MG Tabs tablet Commonly known as:  ELIQUIS   lisinopril 10 MG tablet Commonly known as:  PRINIVIL,ZESTRIL     TAKE these medications   amLODipine 10 MG tablet Commonly known as:  NORVASC Take 10 mg by mouth daily.   gabapentin 300 MG capsule Commonly known as:  NEURONTIN Take 300 mg by mouth 3 (three) times daily.   hydrOXYzine 50 MG tablet Commonly known as:  ATARAX/VISTARIL Take 50 mg by mouth 3 (three) times daily.   mirtazapine 15 MG tablet Commonly known as:  REMERON Take 15 mg by mouth at bedtime.   OLANZapine 20 MG tablet Commonly known as:  ZYPREXA Take 20 mg by mouth at bedtime.      Allergies  Allergen Reactions  .  Bee Venom Swelling  . Penicillins Other (See Comments)    PT states that he had symptoms that felt like he was having a heart attack Has patient had a PCN reaction causing immediate rash, facial/tongue/throat swelling, SOB or lightheadedness with hypotension: No Has patient had a PCN reaction causing severe rash involving mucus membranes or skin necrosis: No Has patient had a PCN reaction that required hospitalization No Has patient had a PCN reaction occurring within the last 10 years: No If all of the above answers are "NO", then may proceed with Cephal  . Sulfa Antibiotics Itching   Follow-up Information    [facility] Follow up in 1 week(s).   Why:  hospital discharge follow up, repeat basic labs including bmp at follow up. Contact information: [place] [phone] [facility] Follow up.   Specialty:  Radiology Contact information: [address] [id number] [place] [phone]           The results of significant diagnostics from this hospitalization (including imaging, microbiology,  ancillary and laboratory) are listed below for reference.    Significant Diagnostic Studies: Dg Chest 2 View  Result Date: 10/18/2017 CLINICAL DATA:  46 year old male with chest pain. EXAM: CHEST  2 VIEW COMPARISON:  Chest CT dated 10/18/2016 FINDINGS: The heart size and mediastinal contours are within normal limits. Both lungs are clear. The visualized skeletal structures are unremarkable. IMPRESSION: No active cardiopulmonary disease. Electronically Signed   By: Anner Crete M.D.   On: 10/18/2017 20:33   Ct Angio Chest Pe W/cm &/or Wo Cm  Result Date: 10/18/2017 CLINICAL DATA:  Acute onset of generalized chest pain. Personal history of pulmonary embolus. EXAM: CT ANGIOGRAPHY CHEST WITH CONTRAST TECHNIQUE: Multidetector CT imaging of the chest was performed using the standard protocol during bolus administration of intravenous contrast. Multiplanar CT image reconstructions and MIPs were obtained to evaluate the vascular anatomy. CONTRAST:  129mL ISOVUE-370 IOPAMIDOL (ISOVUE-370) INJECTION 76% COMPARISON:  Chest radiograph performed earlier today at 8:05 p.m., and CTA of the chest performed 10/18/2016 FINDINGS: Cardiovascular:  There is no evidence of pulmonary embolus. The heart is normal in size. The thoracic aorta is grossly unremarkable. The great vessels are within normal limits. Mediastinum/Nodes: The mediastinum is unremarkable in appearance. No mediastinal lymphadenopathy is seen. No pericardial effusion is identified. The visualized portions of the thyroid gland are unremarkable. No axillary lymphadenopathy is seen. There is slight focal prominence of fat along the posterior aspect of the trachea, without tracheomalacia. Lungs/Pleura: Minimal bibasilar atelectasis is noted. No pleural effusion or pneumothorax is seen. No masses are identified. Upper Abdomen: The visualized portions of the liver and spleen are unremarkable. The visualized portions of the gallbladder, pancreas, adrenal glands  and left kidney are grossly unremarkable. Musculoskeletal: No acute osseous abnormalities are identified. The visualized musculature is unremarkable in appearance. Review of the MIP images confirms the above findings. IMPRESSION: No evidence of pulmonary embolus.  Lungs essentially clear. Electronically Signed   By: Garald Balding M.D.   On: 10/18/2017 22:10   Ct Coronary Morph W/cta Cor W/score W/ca W/cm &/or Wo/cm  Addendum Date: 10/20/2017   ADDENDUM REPORT: 10/20/2017 15:34 CLINICAL DATA:  46 year old male with atypical chest pain and risk factors for CAD that include HTN, tobacco abuse and FH of CAD. EXAM: Cardiac/Coronary  CT TECHNIQUE: The patient was scanned on a [employer]. FINDINGS: A  120 kV prospective scan was triggered in the descending thoracic aorta at 111 HU's. Axial non-contrast 3 mm slices were carried out through the heart. The data set was analyzed on a dedicated work station and scored using the [place]. Gantry rotation speed was 250 msecs and collimation was .6 mm. 5 mg of iv Metoprolol and 0.8 mg of sl NTG was given. The 3D data set was reconstructed in 5% intervals of the 67-82 % of the R-R cycle. Diastolic phases were analyzed on a dedicated work station using MPR, MIP and VRT modes. The patient received 80 cc of contrast. Aorta:  Normal size.  No calcifications.  No dissection. Aortic Valve:  Trileaflet.  No calcifications. Coronary Arteries:  Normal coronary origin.  Right dominance. RCA is a large dominant artery that gives rise to PDA and PLVB. There is no plaque. Left main is a large artery that gives rise to LAD, ramus intermedius and LCX arteries. There is no plaque. LAD is a large vessel that gives rise to one diagonal artery. There is a minimal calcified plaque in the mid LAD with associated stenosis 0-25%. RI is a large artery that has no plaque. LCX is a small non-dominant artery that gives rise to one small OM1 branch. There is no plaque. Other findings:  Normal pulmonary vein drainage into the left atrium. Normal let atrial appendage without a thrombus. Mildly dilated pulmonary artery measuring 34 mm. IMPRESSION: 1. Coronary calcium score of 15 this was 19 percentile for age and sex matched control. 2. Normal coronary origin with right dominance. 3. Minimal non-obstructive plaque in the mid LAD with associated stenosis 0-25%. Aggressive medical management is recommended. 4. Mildly dilated pulmonary artery measuring 34 mm suggestive of pulmonary hypertension. Electronically Signed   By: Ena Dawley   On: 10/20/2017 15:34   Result Date: 10/20/2017 EXAM: OVER-READ INTERPRETATION  CT CHEST The following report is an over-read performed by radiologist Dr. Alvino Blood [facility] on 10/20/2017. This over-read does not include interpretation of cardiac or coronary anatomy or pathology. The coronary CTA interpretation by the cardiologist is attached. COMPARISON:  CT PA 10/18/2017 FINDINGS: Limited view of the lung parenchyma demonstrates no suspicious nodularity. Airways are normal. Limited view of the mediastinum demonstrates no adenopathy. Esophagus normal. Limited view of the upper abdomen unremarkable. Limited view of the skeleton and chest wall is unremarkable. IMPRESSION: No significant extracardiac findings. Electronically Signed: By: Suzy Bouchard M.D. On: 10/20/2017 11:59    Microbiology: No results found for this or any previous visit (from the past 240 hour(s)).   Labs: Basic Metabolic Panel: Recent Labs  Lab 10/18/17 1940 10/20/17 0435  NA 136 138  K 3.6 3.6  CL 100* 106  CO2 22 25  GLUCOSE 88 94  BUN 10 8  CREATININE 1.63* 1.48*  CALCIUM 9.2 8.2*   Liver Function Tests: No results for input(s): AST, ALT, ALKPHOS, BILITOT, PROT, ALBUMIN in the last 168 hours. No results for input(s): LIPASE, AMYLASE in the last 168 hours. No results for input(s): AMMONIA in the last 168 hours. CBC: Recent Labs  Lab  10/18/17 1940  WBC 12.1*  HGB 17.6*  HCT 48.7  MCV 99.0  PLT 312   Cardiac Enzymes: Recent Labs  Lab 10/19/17 0440 10/19/17 1029  TROPONINI <0.03 0.04*   BNP: BNP (last 3 results) No results for input(s): BNP in the last 8760 hours.  ProBNP (last 3 results) No results for input(s): PROBNP in the last 8760 hours.  CBG:  No results for input(s): GLUCAP in the last 168 hours.     Signed:  Florencia Reasons MD, PhD  [facility] 10/20/2017, 7:48 PM

## 2018-05-19 ENCOUNTER — Other Ambulatory Visit (HOSPITAL_COMMUNITY): Payer: Self-pay | Admitting: Neurology

## 2018-05-19 DIAGNOSIS — R42 Dizziness and giddiness: Secondary | ICD-10-CM

## 2018-05-19 DIAGNOSIS — G4452 New daily persistent headache (NDPH): Secondary | ICD-10-CM

## 2018-06-18 ENCOUNTER — Ambulatory Visit (HOSPITAL_COMMUNITY): Payer: Medicaid Other

## 2018-06-24 ENCOUNTER — Ambulatory Visit (HOSPITAL_COMMUNITY)
Admission: RE | Admit: 2018-06-24 | Discharge: 2018-06-24 | Disposition: A | Discharge status: Home / Self Care | Payer: Medicaid Other | Source: Ambulatory Visit | Attending: Neurology | Admitting: Neurology

## 2018-06-24 DIAGNOSIS — G4452 New daily persistent headache (NDPH): Secondary | ICD-10-CM | POA: Insufficient documentation

## 2018-06-24 DIAGNOSIS — R42 Dizziness and giddiness: Secondary | ICD-10-CM

## 2018-06-24 DIAGNOSIS — Z86718 Personal history of other venous thrombosis and embolism: Secondary | ICD-10-CM | POA: Diagnosis present

## 2018-08-12 IMAGING — US US EXTREM LOW VENOUS*R*
1 series · 13 of 24 positions shown · non-contrast
Comparison: Ultrasound right leg 10/18/2016

CLINICAL DATA: Right thigh pain. History of DVT right leg. On blood
thinner.



[Series 1: us extrem low venous*right* · 0.08mm/px · 13 of 40 slices shown]
[im 1/40]
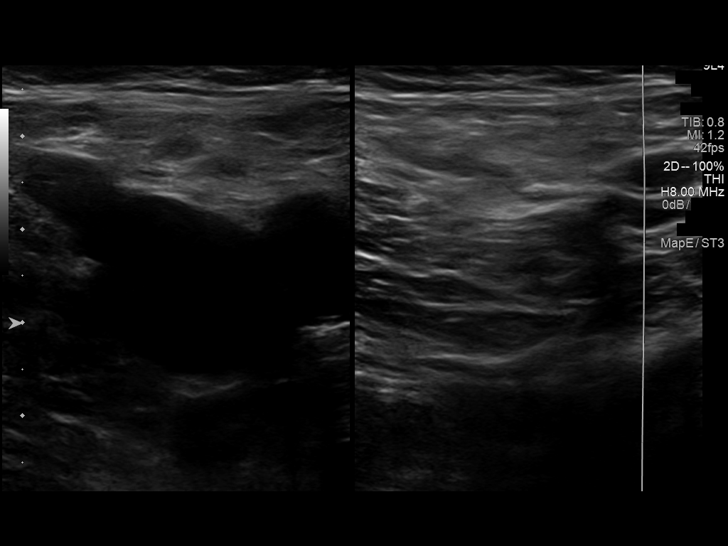
[im 4/40]
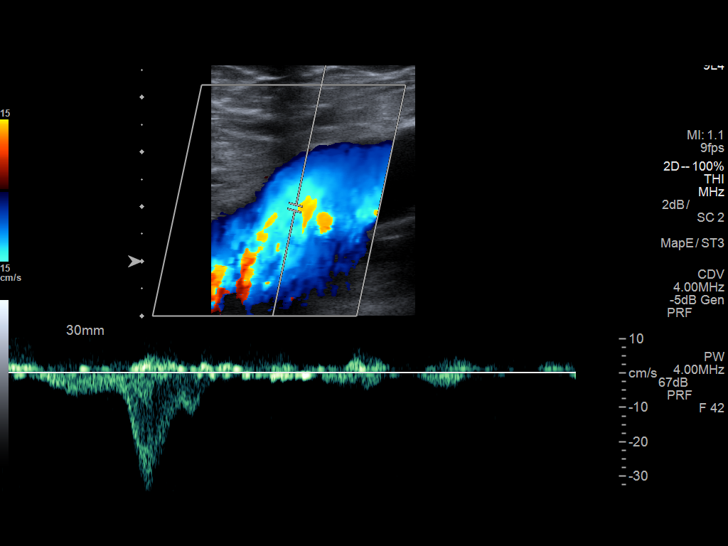
[im 7/40]
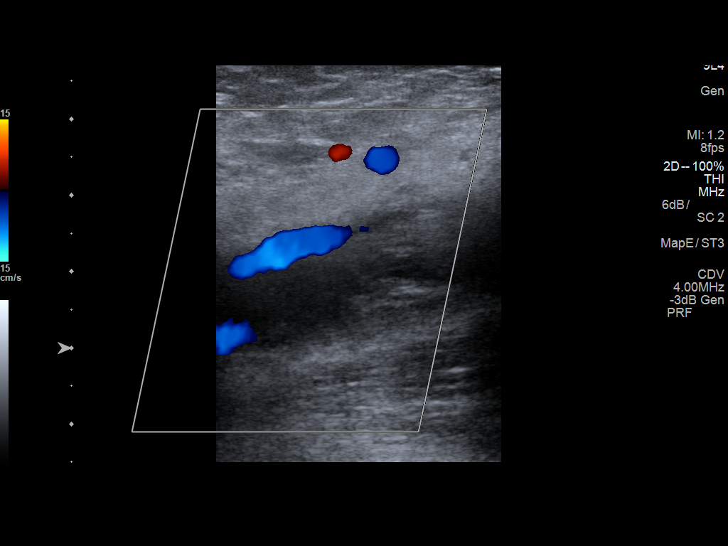
[im 11/40]
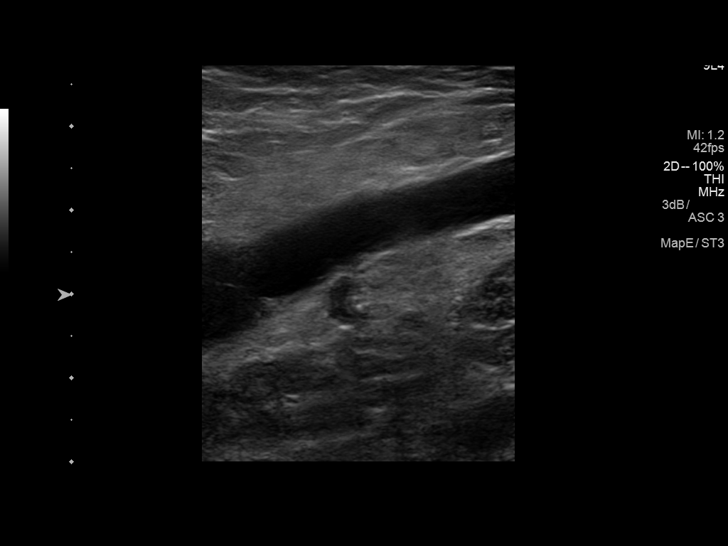
[im 14/40]
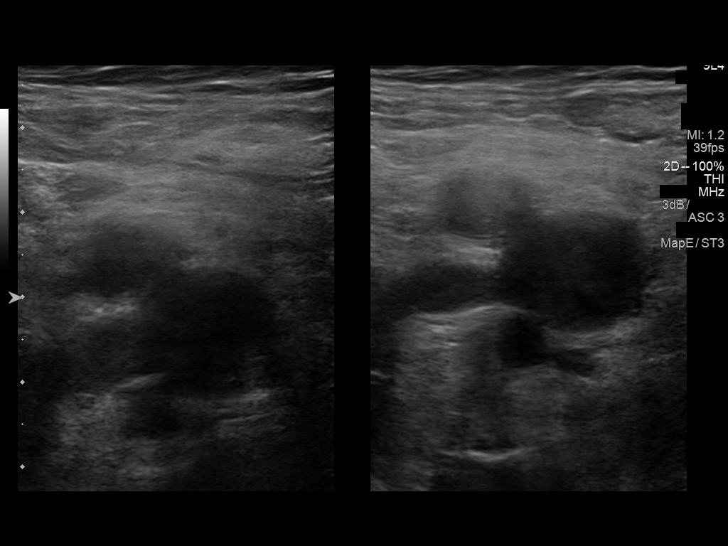
[im 17/40]
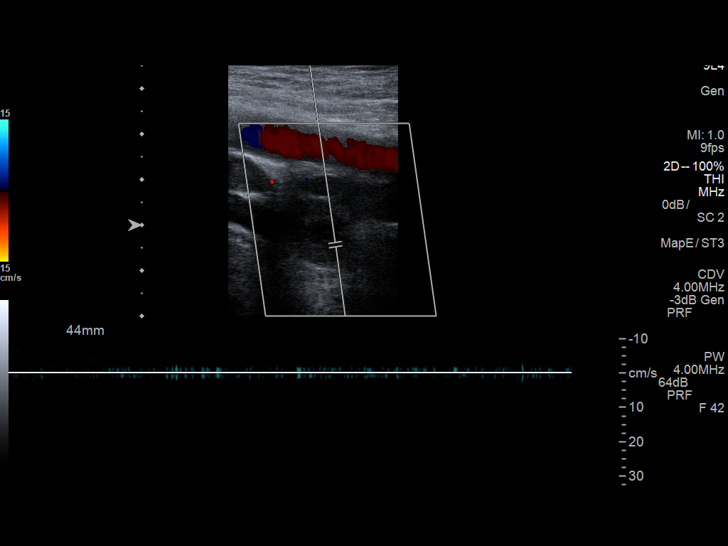
[im 21/40]
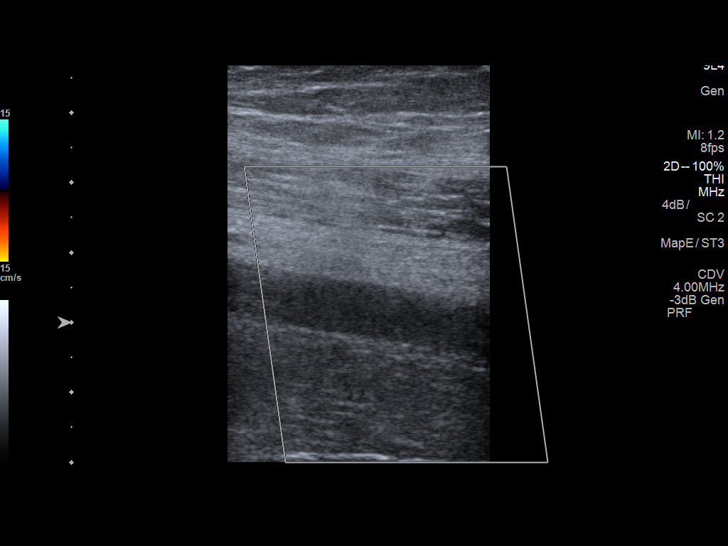
[im 23/40]
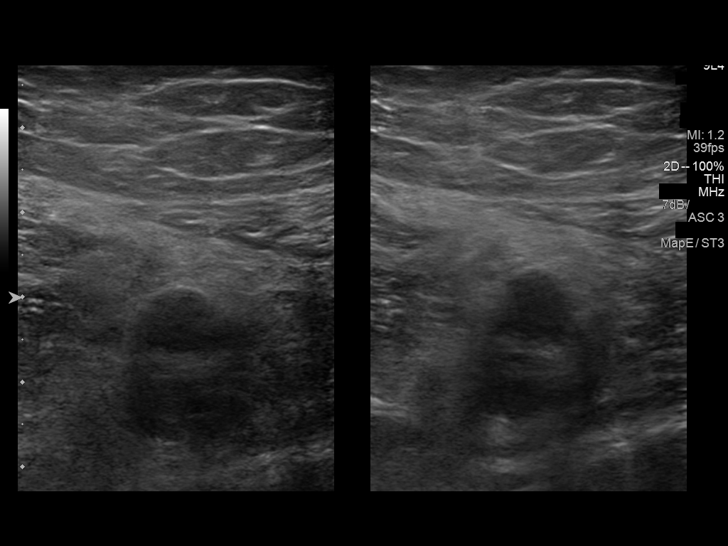
[im 26/40]
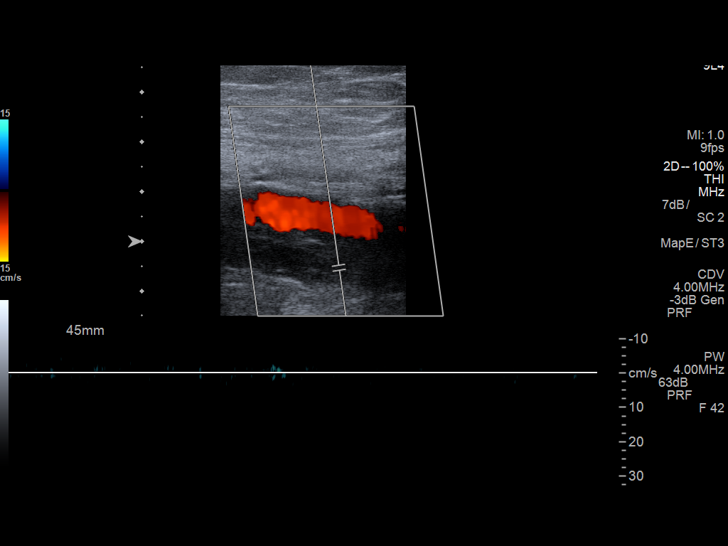
[im 29/40]
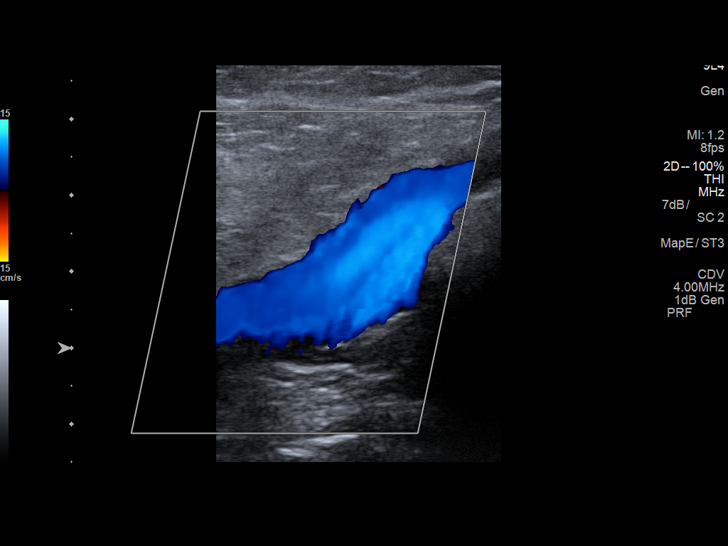
[im 33/40]
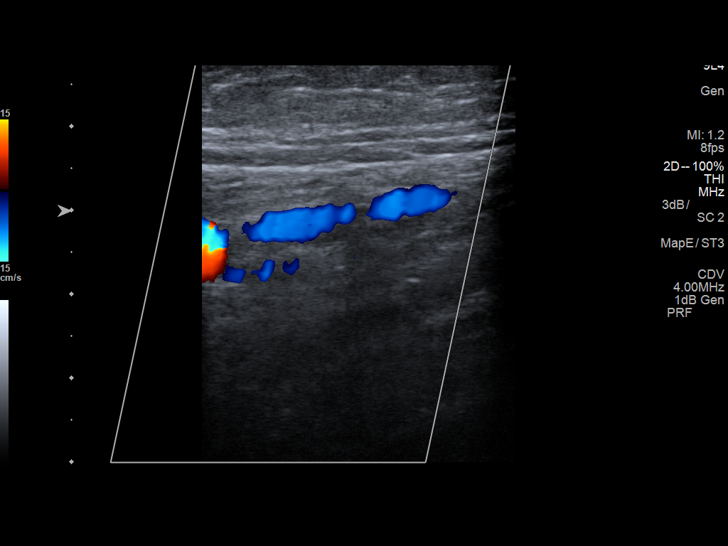
[im 36/40]
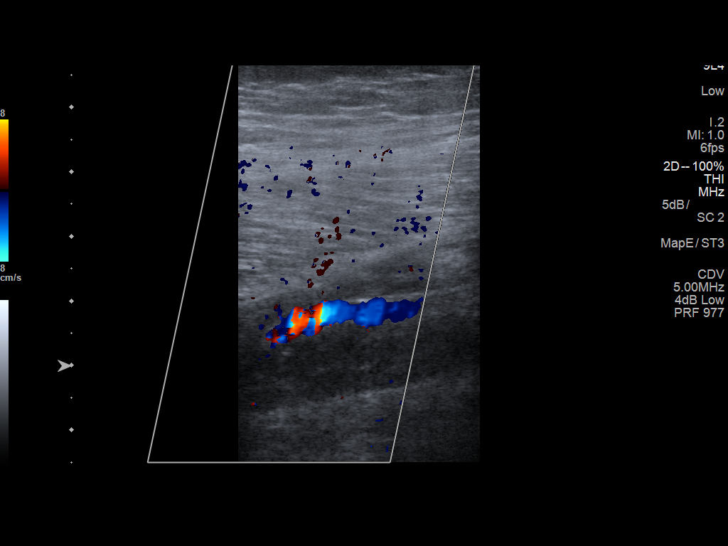
[im 40/40]
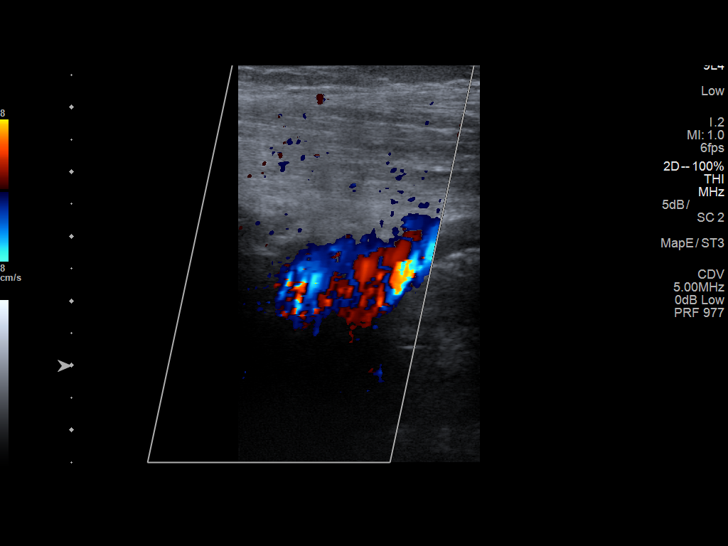

[13 of 24 positions shown; findings below may reference images not displayed]

FINDINGS: Contralateral Common Femoral Vein: Respiratory phasicity is normal
and symmetric with the symptomatic side. No evidence of thrombus.
Normal compressibility.

Common Femoral Vein: Nonocclusive thrombosis.

Saphenofemoral Junction: Occlusive thrombus

Profunda Femoral Vein: Occlusive thrombus

Femoral Vein: Occlusive thrombus

Popliteal Vein: No evidence of thrombus. Normal compressibility,
respiratory phasicity and response to augmentation.

Calf Veins: No evidence of thrombus. Normal compressibility and flow
on color Doppler imaging.

Superficial Great Saphenous Vein: No evidence of thrombus. Normal
compressibility and flow on color Doppler imaging.

Venous Reflux:  None.

Other Findings:  None.
IMPRESSION: Extensive DVT in the right femoral vein as above. This has improved
from the prior study and may be chronic thrombus. It is not possible
to accurately determine the age of the clot.

## 2018-09-13 ENCOUNTER — Encounter (HOSPITAL_COMMUNITY): Payer: Self-pay | Admitting: Emergency Medicine

## 2018-09-13 ENCOUNTER — Emergency Department (HOSPITAL_COMMUNITY)
Admission: EM | Admit: 2018-09-13 | Discharge: 2018-09-13 | Disposition: A | Discharge status: Home / Self Care | Payer: Medicaid Other | Attending: Emergency Medicine | Admitting: Emergency Medicine

## 2018-09-13 ENCOUNTER — Other Ambulatory Visit: Payer: Self-pay

## 2018-09-13 DIAGNOSIS — J45909 Unspecified asthma, uncomplicated: Secondary | ICD-10-CM | POA: Diagnosis not present

## 2018-09-13 DIAGNOSIS — W228XXA Striking against or struck by other objects, initial encounter: Secondary | ICD-10-CM | POA: Diagnosis not present

## 2018-09-13 DIAGNOSIS — F17210 Nicotine dependence, cigarettes, uncomplicated: Secondary | ICD-10-CM | POA: Diagnosis not present

## 2018-09-13 DIAGNOSIS — Y999 Unspecified external cause status: Secondary | ICD-10-CM | POA: Diagnosis not present

## 2018-09-13 DIAGNOSIS — H5712 Ocular pain, left eye: Secondary | ICD-10-CM | POA: Diagnosis present

## 2018-09-13 DIAGNOSIS — Z79899 Other long term (current) drug therapy: Secondary | ICD-10-CM | POA: Diagnosis not present

## 2018-09-13 DIAGNOSIS — Y92008 Other place in unspecified non-institutional (private) residence as the place of occurrence of the external cause: Secondary | ICD-10-CM | POA: Diagnosis not present

## 2018-09-13 DIAGNOSIS — Z23 Encounter for immunization: Secondary | ICD-10-CM | POA: Diagnosis not present

## 2018-09-13 DIAGNOSIS — S0502XA Injury of conjunctiva and corneal abrasion without foreign body, left eye, initial encounter: Secondary | ICD-10-CM

## 2018-09-13 DIAGNOSIS — I10 Essential (primary) hypertension: Secondary | ICD-10-CM | POA: Diagnosis not present

## 2018-09-13 DIAGNOSIS — Y9389 Activity, other specified: Secondary | ICD-10-CM | POA: Diagnosis not present

## 2018-09-13 MED ORDER — HYDROCODONE-ACETAMINOPHEN 5-325 MG PO TABS
1.0000 | ORAL_TABLET | Freq: Once | ORAL | Status: AC
  Administered 2018-09-13: 1 via ORAL
  Filled 2018-09-13: qty 1

## 2018-09-13 MED ORDER — ERYTHROMYCIN 5 MG/GM OP OINT
TOPICAL_OINTMENT | Freq: Once | OPHTHALMIC | Status: AC
  Administered 2018-09-13: 20:00:00 via OPHTHALMIC
  Filled 2018-09-13: qty 3.5

## 2018-09-13 MED ORDER — HYDROCODONE-ACETAMINOPHEN 5-325 MG PO TABS: 1.0000 | ORAL_TABLET | Freq: Four times a day (QID) | ORAL | 0 refills | Status: DC | PRN

## 2018-09-13 MED ORDER — FLUORESCEIN SODIUM 1 MG OP STRP
1.0000 | ORAL_STRIP | Freq: Once | OPHTHALMIC | Status: AC
  Administered 2018-09-13: 1 via OPHTHALMIC
  Filled 2018-09-13: qty 1

## 2018-09-13 MED ORDER — TETRACAINE HCL 0.5 % OP SOLN
2.0000 [drp] | Freq: Once | OPHTHALMIC | Status: AC
  Administered 2018-09-13: 2 [drp] via OPHTHALMIC
  Filled 2018-09-13: qty 4

## 2018-09-13 MED ORDER — TETANUS-DIPHTH-ACELL PERTUSSIS 5-2.5-18.5 LF-MCG/0.5 IM SUSP
0.5000 mL | Freq: Once | INTRAMUSCULAR | Status: AC
  Administered 2018-09-13: 0.5 mL via INTRAMUSCULAR
  Filled 2018-09-13: qty 0.5

## 2018-09-13 NOTE — ED Notes (Signed)
Pt unable to open left eye for accurate visual acuity but was 20/15 in right eye

## 2018-09-13 NOTE — ED Notes (Signed)
PA in with pt 

## 2018-09-13 NOTE — Discharge Instructions (Addendum)
Avoid rubbing your eye, you may find wearing sunglasses even indoors may help relieve your light sensitivity as this abrasion heals.  Apply the erythromycin ointment to your eye as instructed every 4 hours while awake for the next 7 days or until your symptoms are completely resolved.  You may use the pain medication given, but use caution with this medication as it will make you drowsy.  Do not drive within 4 hours of taking this medication.

## 2018-09-13 NOTE — ED Triage Notes (Signed)
Pt accidentally stuck antenna into left eye. Redness around eye noted, watery and sclera red.

## 2018-09-13 NOTE — ED Provider Notes (Signed)
[facility] EMERGENCY DEPARTMENT Provider Note   CSN: [id number] Arrival date & time: 09/13/18  1821     History   Chief Complaint Chief Complaint  Patient presents with  . Eye Pain    HPI Lance Cohen is a 47 y.o. male with a past medical history of hypertension asthma and bipolar disorder presenting with left eye injury which occurred just prior to arrival.  He was in his home and accidentally scratched his left eye with the end of a TV antenna.  He has had exquisite pain in the eye and has been unable to open it secondary to pain and light sensitivity.  He has had no medications prior to arrival.  He is unsure of his tetanus status.  He is also unsure of any changes in his visual acuity as he has been unable to keep the eye open.  There is been copious tearing from this eye.  The history is provided by the patient.    Past Medical History:  Diagnosis Date  . Asthma   . Bipolar 1 disorder ([place])   . Chronic knee pain   . Hypertension   . Suicide attempt [facility])    2010 Intentional overdose attempt with Depakote.    Patient Active Problem List   Diagnosis Date Noted  . Chest pain 10/19/2017  . Asthma 10/19/2017  . Hypertension 10/19/2017  . Chronic knee pain 10/19/2017  . Pain in limb 12/27/2016  . Acute pulmonary embolism ([place]) 10/23/2016  . Nausea and vomiting 10/23/2016  . S/P IVC filter 10/23/2016  . Tobacco abuse counseling 10/23/2016  . DVT (deep venous thrombosis) ([place]) 10/18/2016  . Cellulitis of foot 06/24/2011  . Bipolar 1 disorder ([place]) 06/24/2011  . Tobacco abuse 06/24/2011  . Polycythemia 06/24/2011  . Tinea pedis 06/24/2011  . CARPAL TUNNEL SYNDROME, RIGHT 06/21/2010  . CONTUSION, LEFT FOOT 06/05/2010  . FOREIGN BODY, FOOT 04/17/2010    Past Surgical History:  Procedure Laterality Date  . FOOT SURGERY     had peice of wire removed  . IVC FILTER REMOVAL N/A 02/24/2017   Procedure: IVC Filter Removal;  Surgeon: [name], MD;  Location: [facility];  Service: Cardiovascular;  Laterality: N/A;  . PERIPHERAL VASCULAR CATHETERIZATION Right 10/22/2016   Procedure: Thrombectomy, thrombolyisis;  Surgeon: [name], MD;  Location: [facility];  Service: Cardiovascular;  Laterality: Right;  . PERIPHERAL VASCULAR CATHETERIZATION N/A 10/22/2016   Procedure: IVC Filter Insertion;  Surgeon: [name], MD;  Location: [facility];  Service: Cardiovascular;  Laterality: N/A;        Home Medications    Prior to Admission medications   Medication Sig Start Date End Date Taking? Authorizing Provider  amLODipine (NORVASC) 10 MG tablet Take 10 mg by mouth daily.    [provider]  gabapentin (NEURONTIN) 300 MG capsule Take 300 mg by mouth 3 (three) times daily.     [provider]  HYDROcodone-acetaminophen (NORCO/VICODIN) 5-325 MG tablet Take 1 tablet by mouth every 6 (six) hours as needed for severe pain. 09/13/18   Evalee Jefferson, PA-C  hydrOXYzine (ATARAX/VISTARIL) 50 MG tablet Take 50 mg by mouth 3 (three) times daily.    [provider]  mirtazapine (REMERON) 15 MG tablet Take 15 mg by mouth at bedtime.    [provider]  OLANZapine (ZYPREXA) 20 MG tablet Take 20 mg by mouth at bedtime.    [provider]    Family History  Family History  Problem Relation Age of Onset  . Cancer Mother   . Heart attack Father     Social History Social History   Tobacco Use  . Smoking status: Current Every Day Smoker    Packs/day: 0.50  . Smokeless tobacco: Never Used  Substance Use Topics  . Alcohol use: Yes    Comment: occasionally  . Drug use: No     Allergies   Bee venom; Penicillins; and Sulfa antibiotics   Review of Systems Review of Systems  Constitutional: Negative for chills and fever.  HENT: Negative for congestion, ear pain, rhinorrhea and trouble swallowing.   Eyes: Positive for photophobia and pain. Negative for discharge.  Respiratory:  Negative.   Cardiovascular: Negative.   Gastrointestinal: Negative.   Genitourinary: Negative.   Neurological: Negative for headaches.     Physical Exam Updated Vital Signs BP (!) 141/99 (BP Location: Right Arm)   Pulse 82   Temp 98 F (36.7 C) (Oral)   Resp 17   SpO2 99%   Physical Exam  Constitutional: He is oriented to person, place, and time. He appears well-developed and well-nourished.  HENT:  Head: Normocephalic and atraumatic.  Right Ear: Tympanic membrane and ear canal normal.  Left Ear: Tympanic membrane and ear canal normal.  Nose: Mucosal edema and rhinorrhea present.  Mouth/Throat: Uvula is midline, oropharynx is clear and moist and mucous membranes are normal. No oropharyngeal exudate, posterior oropharyngeal edema, posterior oropharyngeal erythema or tonsillar abscesses.  Eyes: Pupils are equal, round, and reactive to light. EOM and lids are normal. Left eye exhibits no chemosis, no discharge and no exudate. No foreign body present in the left eye. Left conjunctiva is injected.  Slit lamp exam:      The left eye shows corneal abrasion and fluorescein uptake. The left eye shows no corneal flare, no corneal ulcer, no foreign body and no hyphema.  No consensual pain.  There is a large linear abrasion of the left cornea and a smaller stutter abrasion at the inferior edge of the larger abrasion.  It is located in a 1:00 to 6 o'clock position and does cover the midline.  There is no extravasation of fluorescein to the anterior chamber.  Visual Acuity Bilateral Distance: 20/20  R Distance: 20/15  L Distance: 20/40     Cardiovascular: Normal rate.  Pulmonary/Chest: Effort normal. No respiratory distress.  Musculoskeletal: Normal range of motion.  Neurological: He is alert and oriented to person, place, and time.  Skin: Skin is warm and dry. No rash noted.  Psychiatric: He has a normal mood and affect.     ED Treatments / Results  Labs (all labs ordered are  listed, but only abnormal results are displayed) Labs Reviewed - No data to display  EKG None  Radiology No results found.  Procedures Procedures (including critical care time)  Medications Ordered in ED Medications  tetracaine (PONTOCAINE) 0.5 % ophthalmic solution 2 drop (2 drops Both Eyes Given by Other 09/13/18 1952)  fluorescein ophthalmic strip 1 strip (1 strip Both Eyes Given by Other 09/13/18 1952)  Tdap (BOOSTRIX) injection 0.5 mL (0.5 mLs Intramuscular Given 09/13/18 2017)  erythromycin ophthalmic ointment ( Left Eye Given 09/13/18 2022)     Initial Impression / Assessment and Plan / ED Course  I have reviewed the triage vital signs and the nursing notes.  Pertinent labs & imaging results that were available during my care of the patient were reviewed by me and considered in my medical decision  making (see chart for details).     Patient with rather large corneal abrasion without corneal laceration.  He was placed on erythromycin ophthalmic ointment.  He was also given a few pain tablets for pain relief.  He was advised that he needs close follow-up and ensure that this abrasion is healing properly.  He was given a referral to Dr. Mellissa Kohut office.  Patient knows to call in the morning for an appointment time for follow-up within the next several days.  Alternately as he expressed concern about transportation, if he has any persistent or worsened symptoms he was advised to return here.  Final Clinical Impressions(s) / ED Diagnoses   Final diagnoses:  Abrasion of left cornea, initial encounter    ED Discharge Orders         Ordered    HYDROcodone-acetaminophen (NORCO/VICODIN) 5-325 MG tablet  Every 6 hours PRN     09/13/18 2031           Evalee Jefferson, Hershal Coria 09/13/18 2034    Milton Ferguson, MD 09/13/18 2255

## 2018-09-14 MED FILL — Hydrocodone-Acetaminophen Tab 5-325 MG: ORAL | Qty: 6 | Status: AC

## 2018-10-10 ENCOUNTER — Encounter (HOSPITAL_COMMUNITY): Payer: Self-pay | Admitting: Emergency Medicine

## 2018-10-10 ENCOUNTER — Emergency Department (HOSPITAL_COMMUNITY): Payer: Medicaid Other

## 2018-10-10 ENCOUNTER — Emergency Department (HOSPITAL_COMMUNITY)
Admission: EM | Admit: 2018-10-10 | Discharge: 2018-10-10 | Disposition: A | Discharge status: Home / Self Care | Payer: Medicaid Other | Attending: Emergency Medicine | Admitting: Emergency Medicine

## 2018-10-10 DIAGNOSIS — J45909 Unspecified asthma, uncomplicated: Secondary | ICD-10-CM | POA: Diagnosis not present

## 2018-10-10 DIAGNOSIS — F319 Bipolar disorder, unspecified: Secondary | ICD-10-CM | POA: Insufficient documentation

## 2018-10-10 DIAGNOSIS — Z79899 Other long term (current) drug therapy: Secondary | ICD-10-CM | POA: Insufficient documentation

## 2018-10-10 DIAGNOSIS — F17200 Nicotine dependence, unspecified, uncomplicated: Secondary | ICD-10-CM | POA: Insufficient documentation

## 2018-10-10 DIAGNOSIS — I10 Essential (primary) hypertension: Secondary | ICD-10-CM | POA: Insufficient documentation

## 2018-10-10 DIAGNOSIS — M79604 Pain in right leg: Secondary | ICD-10-CM | POA: Diagnosis present

## 2018-10-10 DIAGNOSIS — I82411 Acute embolism and thrombosis of right femoral vein: Secondary | ICD-10-CM

## 2018-10-10 HISTORY — DX: Acute embolism and thrombosis of unspecified deep veins of unspecified lower extremity: I82.409

## 2018-10-10 LAB — CBC
HEMATOCRIT: 49.9 % (ref 39.0–52.0)
HEMOGLOBIN: 16.6 g/dL (ref 13.0–17.0)
MCH: 33.5 pg (ref 26.0–34.0)
MCHC: 33.3 g/dL (ref 30.0–36.0)
MCV: 100.8 fL — ABNORMAL HIGH (ref 80.0–100.0)
Platelets: 245 10*3/uL (ref 150–400)
RBC: 4.95 MIL/uL (ref 4.22–5.81)
RDW: 12.4 % (ref 11.5–15.5)
WBC: 9.7 10*3/uL (ref 4.0–10.5)
nRBC: 0 % (ref 0.0–0.2)

## 2018-10-10 LAB — BASIC METABOLIC PANEL
Anion gap: 5 (ref 5–15)
BUN: 10 mg/dL (ref 6–20)
CHLORIDE: 104 mmol/L (ref 98–111)
CO2: 28 mmol/L (ref 22–32)
Calcium: 9 mg/dL (ref 8.9–10.3)
Creatinine, Ser: 1.33 mg/dL — ABNORMAL HIGH (ref 0.61–1.24)
GFR calc non Af Amer: >60 mL/min (ref >60)
Glucose, Bld: 114 mg/dL — ABNORMAL HIGH (ref 70–99)
POTASSIUM: 4.9 mmol/L (ref 3.5–5.1)
SODIUM: 137 mmol/L (ref 135–145)

## 2018-10-10 MED ORDER — RIVAROXABAN 15 MG PO TABS
15.0000 mg | ORAL_TABLET | Freq: Once | ORAL | Status: AC
  Administered 2018-10-10: 15 mg via ORAL
  Filled 2018-10-10: qty 1

## 2018-10-10 MED ORDER — HYDROCODONE-ACETAMINOPHEN 5-325 MG PO TABS: 1.0000 | ORAL_TABLET | ORAL | 0 refills | Status: DC | PRN

## 2018-10-10 MED ORDER — HYDROCODONE-ACETAMINOPHEN 5-325 MG PO TABS
1.0000 | ORAL_TABLET | Freq: Once | ORAL | Status: AC
  Administered 2018-10-10: 1 via ORAL
  Filled 2018-10-10: qty 1

## 2018-10-10 MED ORDER — RIVAROXABAN (XARELTO) VTE STARTER PACK (15 & 20 MG): ORAL_TABLET | ORAL | 0 refills | Status: DC

## 2018-10-10 MED ORDER — ACETAMINOPHEN 325 MG PO TABS
650.0000 mg | ORAL_TABLET | Freq: Once | ORAL | Status: AC | PRN
  Administered 2018-10-10: 650 mg via ORAL
  Filled 2018-10-10: qty 2

## 2018-10-10 MED ORDER — RIVAROXABAN (XARELTO) EDUCATION KIT FOR DVT/PE PATIENTS: PACK | Freq: Once | Status: DC

## 2018-10-10 NOTE — ED Notes (Signed)
Pt returned from US

## 2018-10-10 NOTE — ED Triage Notes (Signed)
Pt has been having right leg pain with no injury x 3 weeks.  Hx of dvt with no current anticoagulation.

## 2018-10-10 NOTE — Discharge Instructions (Signed)
If you develop worsening leg pain, color change to your leg, weakness or numbness in your leg, chest pain, shortness of breath, or any other new/concerning symptoms and return to the ER for evaluation.  Do not take NSAIDs such as ibuprofen, Motrin, Aleve, Advil, Naprosyn, naproxen, etc.  Do not take aspirin.  If you develop a severe headache, abnormal bleeding or other new findings while on the blood thinner then return to the hospital immediately.

## 2018-10-10 NOTE — ED Notes (Signed)
Notified pt we are waiting for pharmacy to bring [place] and do education before d/c. Pt and family verbalized understanding.

## 2018-10-10 NOTE — ED Notes (Signed)
Pt refusing to wait for pharmacy Xarelto education kit.

## 2018-10-10 NOTE — ED Provider Notes (Signed)
[facility] EMERGENCY DEPARTMENT Provider Note   CSN: [id number] Arrival date & time: 10/10/18  4259     History   Chief Complaint Chief Complaint  Patient presents with  . Leg Pain    HPI Lance Cohen is a 47 y.o. male.  HPI  47 year old male presents with right leg pain.  He states is been present for about 2 or 3 weeks.  Goes from his toes to his hip.  Feels like when he had a previous DVT.  He also has chronic right knee pain and has a chronic BB under his skin on the lateral aspect.  Nothing makes the pain better.  He has not tried any medicines for it. States he was told he took too many ibuprofen when in prison causing a "hole in my stomach". Doesn't take tylenol because it doesn't do anything.  Leaving the leg in a still position for too long makes it seem to hurt worse.  He does not feel like his leg is swollen.  There is no chest pain or shortness of breath.   Past Medical History:  Diagnosis Date  . Asthma   . Bipolar 1 disorder ([place])   . Chronic knee pain   . DVT (deep venous thrombosis) ([place])   . Hypertension   . Suicide attempt [facility])    2010 Intentional overdose attempt with Depakote.    Patient Active Problem List   Diagnosis Date Noted  . Chest pain 10/19/2017  . Asthma 10/19/2017  . Hypertension 10/19/2017  . Chronic knee pain 10/19/2017  . Pain in limb 12/27/2016  . Acute pulmonary embolism ([place]) 10/23/2016  . Nausea and vomiting 10/23/2016  . S/P IVC filter 10/23/2016  . Tobacco abuse counseling 10/23/2016  . DVT (deep venous thrombosis) ([place]) 10/18/2016  . Cellulitis of foot 06/24/2011  . Bipolar 1 disorder ([place]) 06/24/2011  . Tobacco abuse 06/24/2011  . Polycythemia 06/24/2011  . Tinea pedis 06/24/2011  . CARPAL TUNNEL SYNDROME, RIGHT 06/21/2010  . CONTUSION, LEFT FOOT 06/05/2010  . FOREIGN BODY, FOOT 04/17/2010    Past Surgical History:  Procedure Laterality Date  . FOOT SURGERY     had peice of wire removed  . IVC FILTER REMOVAL  N/A 02/24/2017   Procedure: IVC Filter Removal;  Surgeon: [name], MD;  Location: [place] [facility];  Service: Cardiovascular;  Laterality: N/A;  . PERIPHERAL VASCULAR CATHETERIZATION Right 10/22/2016   Procedure: Thrombectomy, thrombolyisis;  Surgeon: [name], MD;  Location: [place] [facility];  Service: Cardiovascular;  Laterality: Right;  . PERIPHERAL VASCULAR CATHETERIZATION N/A 10/22/2016   Procedure: IVC Filter Insertion;  Surgeon: [name], MD;  Location: [place] [facility];  Service: Cardiovascular;  Laterality: N/A;        Home Medications    Prior to Admission medications   Medication Sig Start Date End Date Taking? Authorizing Provider  amLODipine (NORVASC) 10 MG tablet Take 10 mg by mouth daily.   Yes [provider]  gabapentin (NEURONTIN) 300 MG capsule Take 300 mg by mouth 3 (three) times daily.    Yes [provider]  hydrOXYzine (ATARAX/VISTARIL) 50 MG tablet Take 50 mg by mouth 3 (three) times daily.   Yes [provider]  HYDROcodone-acetaminophen (NORCO) 5-325 MG tablet Take 1 tablet by mouth every 4 (four) hours as needed for severe pain. 10/10/18   Sherwood Gambler, MD  mirtazapine (REMERON) 15 MG tablet Take 15 mg by mouth at bedtime.    [provider]  OLANZapine (ZYPREXA) 20 MG tablet Take 20 mg by mouth at bedtime.    [provider]  Rivaroxaban 15 & 20 MG TBPK Take as directed on package: Start with one 52m tablet by mouth twice a day with food. On Day 22, switch to one 265mtablet once a day with food. 10/10/18   GoSherwood GamblerMD    Family History Family History  Problem Relation Age of Onset  . Cancer Mother   . Heart attack Father     Social History Social History   Tobacco Use  . Smoking status: Current Every Day Smoker    Packs/day: 0.50  . Smokeless tobacco: Never Used  Substance Use Topics  . Alcohol use: Yes    Comment: weekly  . Drug use: No     Allergies   Bee  venom; Penicillins; and Sulfa antibiotics   Review of Systems Review of Systems  Respiratory: Negative for shortness of breath.   Cardiovascular: Negative for chest pain and leg swelling.  Musculoskeletal: Positive for arthralgias and myalgias. Negative for joint swelling.  Neurological: Negative for weakness.     Physical Exam Updated Vital Signs BP 98/76   Pulse (!) 56   Temp 97.9 F (36.6 C) (Oral)   Resp 18   Ht 6' (1.829 m)   Wt 113.4 kg   SpO2 96%   BMI 33.91 kg/m   Physical Exam Vitals signs and nursing note reviewed.  Constitutional:      Appearance: He is well-developed.  HENT:     Head: Normocephalic and atraumatic.     Right Ear: External ear normal.     Left Ear: External ear normal.     Nose: Nose normal.  Eyes:     General:        Right eye: No discharge.        Left eye: No discharge.  Neck:     Musculoskeletal: Neck supple.  Cardiovascular:     Rate and Rhythm: Normal rate and regular rhythm.     Pulses:          Dorsalis pedis pulses are 2+ on the right side and 2+ on the left side.  Pulmonary:     Effort: Pulmonary effort is normal.  Musculoskeletal:        General: No swelling.     Right lower leg: No edema.     Left lower leg: No edema.     Comments: Diffuse tenderness, mild, from right foot to knee. Most prominent in knee and calf. Normal ROM of right hip and knee. No joint swelling or unilateral leg swelling. No skin color changes  Skin:    General: Skin is warm and dry.     Findings: No erythema.  Neurological:     Mental Status: He is alert.  Psychiatric:        Mood and Affect: Mood is not anxious.      ED Treatments / Results  Labs (all labs ordered are listed, but only abnormal results are displayed) Labs Reviewed  CBC - Abnormal; Notable for the following components:      Result Value   MCV 100.8 (*)    All other components within normal limits  BASIC METABOLIC PANEL - Abnormal; Notable for the following components:    Glucose, Bld 114 (*)    Creatinine, Ser 1.33 (*)    All other components within normal limits    EKG None  Radiology Us[place]enous Img Lower Unilateral Right  Result Date: 10/10/2018 CLINICAL DATA:  Right lower extremity pain. EXAM: RIGHT LOWER EXTREMITY VENOUS DOPPLER ULTRASOUND TECHNIQUE: Gray-scale sonography with graded compression, as well as color Doppler and duplex ultrasound were performed to evaluate the lower extremity deep venous systems from the level of the common femoral vein and including the common femoral, femoral, profunda femoral, popliteal and calf veins including the posterior tibial, peroneal and gastrocnemius veins when visible. The superficial great saphenous vein was also interrogated. Spectral Doppler was utilized to evaluate flow at rest and with distal augmentation maneuvers in the common femoral, femoral and popliteal veins. COMPARISON:  None. FINDINGS: Contralateral Common Femoral Vein: Respiratory phasicity is normal and symmetric with the symptomatic side. No evidence of thrombus. Normal compressibility. Common Femoral Vein: No evidence of thrombus. Normal compressibility, respiratory phasicity and response to augmentation. Saphenofemoral Junction: No evidence of thrombus. Normal compressibility and flow on color Doppler imaging. Profunda Femoral Vein: Nonocclusive thrombus present. Femoral Vein: Thrombus present in the femoral vein of the thigh. There is some flow present but the thrombus is nearly occlusive. Popliteal Vein: No evidence of thrombus. Normal compressibility, respiratory phasicity and response to augmentation. Calf Veins: No evidence of thrombus. Normal compressibility and flow on color Doppler imaging. Superficial Great Saphenous Vein: No evidence of thrombus. Normal compressibility. Venous Reflux:  None. Other Findings: No evidence of superficial thrombophlebitis or abnormal fluid collection. IMPRESSION: Deep venous thrombosis of the right lower extremity  primarily at the level of the femoral vein in the thigh with nearly occlusive thrombus present. There is nonocclusive thrombus extending into the profunda femoral vein. Electronically Signed   By: Aletta Edouard M.D.   On: 10/10/2018 10:42   Dg Knee Complete 4 Views Right  Result Date: 10/10/2018 CLINICAL DATA:  Right leg pain. No injury. History of previous left lower extremity DVT. EXAM: RIGHT KNEE - COMPLETE 4+ VIEW COMPARISON:  08/23/2013 FINDINGS: Mild degenerative changes of the lateral compartment. No fracture, dislocation or significant joint effusion. Metallic BB over the superficial lateral soft tissues. IMPRESSION: No acute findings. Mild degenerative changes of the lateral compartment. Metallic BB over the lateral superficial soft tissues. Electronically Signed   By: Marin Olp M.D.   On: 10/10/2018 09:31    Procedures Procedures (including critical care time)  Medications Ordered in ED Medications  Rivaroxaban (XARELTO) tablet 15 mg (has no administration in time range)  rivaroxaban (XARELTO) Education Kit for DVT/PE patients (has no administration in time range)  acetaminophen (TYLENOL) tablet 650 mg (650 mg Oral Given 10/10/18 0932)  HYDROcodone-acetaminophen (NORCO/VICODIN) 5-325 MG per tablet 1 tablet (1 tablet Oral Given 10/10/18 1059)     Initial Impression / Assessment and Plan / ED Course  I have reviewed the triage vital signs and the nursing notes.  Pertinent labs & imaging results that were available during my care of the patient were reviewed by me and considered in my medical decision making (see chart for details).     Patient is found to have a DVT which would explain his symptoms.  Likely subacute given his had pain/discomfort for 2 or 3 weeks.  However he does not appear to have a severe disease with no neurovascular compromise.  He does not have any PE signs or symptoms.  His renal function was checked and appears adequate for Xarelto.  He was counseled  on possible side effects/risks of being on a blood thinner.  As far as his chronic knee pain, he does have the chronic BB in the subcutaneous tissues and he  will be referred to orthopedics for his chronic knee pain.  I will refer him to vascular for the DVT.  He does not appear to need an admission at this time but I did counsel him on return precautions both related to the DVT and the blood thinner.  Final Clinical Impressions(s) / ED Diagnoses   Final diagnoses:  Acute deep vein thrombosis (DVT) of femoral vein of right lower extremity [facility])    ED Discharge Orders         Ordered    Rivaroxaban 15 & 20 MG TBPK     10/10/18 1159    HYDROcodone-acetaminophen (NORCO) 5-325 MG tablet  Every 4 hours PRN     10/10/18 1200           Sherwood Gambler, MD 10/10/18 1221

## 2018-10-10 NOTE — ED Notes (Signed)
Pharmacy notified for xarelto dose and teaching!

## 2018-10-10 NOTE — ED Notes (Addendum)
Patient transported to X-ray 

## 2018-10-12 ENCOUNTER — Telehealth: Payer: Self-pay | Admitting: Orthopedic Surgery

## 2018-10-12 NOTE — Telephone Encounter (Signed)
Call received from patient today, 10/12/18, following Lance Cohen Emergency room visit 10/10/18. States seen for right leg pain, and was found to have a blood clot, and a BB in this leg. Relayed that Dr Aline Brochure will need to review. In addition, primary care referral (from [place]) required due to patient's insurance. Patient voiced understanding, but was unhappy about insurance referral process.

## 2018-10-16 ENCOUNTER — Emergency Department (HOSPITAL_COMMUNITY)
Admission: EM | Admit: 2018-10-16 | Discharge: 2018-10-16 | Disposition: A | Discharge status: Home / Self Care | Payer: Medicaid Other | Attending: Emergency Medicine | Admitting: Emergency Medicine

## 2018-10-16 ENCOUNTER — Telehealth: Payer: Self-pay | Admitting: Vascular Surgery

## 2018-10-16 ENCOUNTER — Emergency Department (HOSPITAL_COMMUNITY): Payer: Medicaid Other

## 2018-10-16 ENCOUNTER — Other Ambulatory Visit: Payer: Self-pay

## 2018-10-16 ENCOUNTER — Encounter (HOSPITAL_COMMUNITY): Payer: Self-pay

## 2018-10-16 DIAGNOSIS — N3090 Cystitis, unspecified without hematuria: Secondary | ICD-10-CM | POA: Diagnosis not present

## 2018-10-16 DIAGNOSIS — R1030 Lower abdominal pain, unspecified: Secondary | ICD-10-CM | POA: Diagnosis present

## 2018-10-16 DIAGNOSIS — N3000 Acute cystitis without hematuria: Secondary | ICD-10-CM

## 2018-10-16 LAB — CBC WITH DIFFERENTIAL/PLATELET
Abs Immature Granulocytes: 0.02 10*3/uL (ref 0.00–0.07)
BASOS ABS: 0.1 10*3/uL (ref 0.0–0.1)
BASOS PCT: 1 %
EOS ABS: 0.2 10*3/uL (ref 0.0–0.5)
Eosinophils Relative: 2 %
HCT: 49.3 % (ref 39.0–52.0)
Hemoglobin: 17.1 g/dL — ABNORMAL HIGH (ref 13.0–17.0)
IMMATURE GRANULOCYTES: 0 %
Lymphocytes Relative: 28 %
Lymphs Abs: 2.8 10*3/uL (ref 0.7–4.0)
MCH: 34.1 pg — ABNORMAL HIGH (ref 26.0–34.0)
MCHC: 34.7 g/dL (ref 30.0–36.0)
MCV: 98.2 fL (ref 80.0–100.0)
Monocytes Absolute: 0.8 10*3/uL (ref 0.1–1.0)
Monocytes Relative: 8 %
NEUTROS PCT: 61 %
NRBC: 0 % (ref 0.0–0.2)
Neutro Abs: 6.2 10*3/uL (ref 1.7–7.7)
PLATELETS: 258 10*3/uL (ref 150–400)
RBC: 5.02 MIL/uL (ref 4.22–5.81)
RDW: 12.4 % (ref 11.5–15.5)
WBC: 10.2 10*3/uL (ref 4.0–10.5)

## 2018-10-16 LAB — URINALYSIS, ROUTINE W REFLEX MICROSCOPIC
BILIRUBIN URINE: NEGATIVE
GLUCOSE, UA: NEGATIVE mg/dL
HGB URINE DIPSTICK: NEGATIVE
KETONES UR: NEGATIVE mg/dL
Nitrite: NEGATIVE
PROTEIN: NEGATIVE mg/dL
Specific Gravity, Urine: 1.013 (ref 1.005–1.030)
pH: 6 (ref 5.0–8.0)

## 2018-10-16 LAB — COMPREHENSIVE METABOLIC PANEL
ALBUMIN: 4.2 g/dL (ref 3.5–5.0)
ALT: 24 U/L (ref 0–44)
ANION GAP: 8 (ref 5–15)
AST: 24 U/L (ref 15–41)
Alkaline Phosphatase: 68 U/L (ref 38–126)
BUN: 13 mg/dL (ref 6–20)
CHLORIDE: 106 mmol/L (ref 98–111)
CO2: 23 mmol/L (ref 22–32)
Calcium: 8.9 mg/dL (ref 8.9–10.3)
Creatinine, Ser: 1.22 mg/dL (ref 0.61–1.24)
GFR calc Af Amer: >60 mL/min (ref >60)
GFR calc non Af Amer: >60 mL/min (ref >60)
Glucose, Bld: 92 mg/dL (ref 70–99)
POTASSIUM: 4.2 mmol/L (ref 3.5–5.1)
Sodium: 137 mmol/L (ref 135–145)
Total Bilirubin: 0.4 mg/dL (ref 0.3–1.2)
Total Protein: 8 g/dL (ref 6.5–8.1)

## 2018-10-16 MED ORDER — IOPAMIDOL (ISOVUE-300) INJECTION 61%
100.0000 mL | Freq: Once | INTRAVENOUS | Status: AC | PRN
  Administered 2018-10-16: 100 mL via INTRAVENOUS

## 2018-10-16 MED ORDER — HYDROCODONE-ACETAMINOPHEN 5-325 MG PO TABS: 1.0000 | ORAL_TABLET | Freq: Four times a day (QID) | ORAL | 0 refills | Status: DC | PRN

## 2018-10-16 MED ORDER — CIPROFLOXACIN HCL 500 MG PO TABS: 500.0000 mg | ORAL_TABLET | Freq: Two times a day (BID) | ORAL | 0 refills | Status: DC

## 2018-10-16 MED ORDER — CIPROFLOXACIN HCL 250 MG PO TABS
500.0000 mg | ORAL_TABLET | Freq: Once | ORAL | Status: AC
  Administered 2018-10-16: 500 mg via ORAL
  Filled 2018-10-16: qty 2

## 2018-10-16 NOTE — ED Notes (Signed)
Burning when voiding, scant amount, painful, gave cup of water, pt has a urinal

## 2018-10-16 NOTE — Telephone Encounter (Signed)
Pt called and left a voice message saying he was at the ED on Saturday for a DVT.  Pt says his leg is no longer hurting but now his pain is severe at his groin and his Left shoulder is hurting.  Patient advised to go back to the ED and advised to call ambulance or get someone to drive him.  Patient verbalizes understanding.  Thurston Hole., LPN

## 2018-10-16 NOTE — Discharge Instructions (Addendum)
Follow up next week for recheck °

## 2018-10-16 NOTE — ED Triage Notes (Signed)
Pt reports that he now has right groin pain for several days. He was seen for DVT in right leg  Pt says that he was referred but whomever he was referrred too wouldn't see him. Pt reports burning with urination

## 2018-10-16 NOTE — ED Provider Notes (Signed)
[facility] EMERGENCY DEPARTMENT Provider Note   CSN: [id number] Arrival date & time: 10/16/18  1621     History   Chief Complaint Chief Complaint  Patient presents with  . Groin Pain    HPI Lance Cohen is a 47 y.o. male.  Patient complains of right groin pain and dysuria  The history is provided by the patient. No language interpreter was used.  Groin Pain  This is a new problem. The current episode started more than 2 days ago. The problem occurs constantly. The problem has not changed since onset.Associated symptoms include abdominal pain. Pertinent negatives include no chest pain and no headaches. Nothing aggravates the symptoms. Nothing relieves the symptoms. He has tried nothing for the symptoms. The treatment provided no relief.    Past Medical History:  Diagnosis Date  . Asthma   . Bipolar 1 disorder ([place])   . Chronic knee pain   . DVT (deep venous thrombosis) ([place])   . Hypertension   . Suicide attempt [facility])    2010 Intentional overdose attempt with Depakote.    Patient Active Problem List   Diagnosis Date Noted  . Chest pain 10/19/2017  . Asthma 10/19/2017  . Hypertension 10/19/2017  . Chronic knee pain 10/19/2017  . Pain in limb 12/27/2016  . Acute pulmonary embolism ([place]) 10/23/2016  . Nausea and vomiting 10/23/2016  . S/P IVC filter 10/23/2016  . Tobacco abuse counseling 10/23/2016  . DVT (deep venous thrombosis) ([place]) 10/18/2016  . Cellulitis of foot 06/24/2011  . Bipolar 1 disorder ([place]) 06/24/2011  . Tobacco abuse 06/24/2011  . Polycythemia 06/24/2011  . Tinea pedis 06/24/2011  . CARPAL TUNNEL SYNDROME, RIGHT 06/21/2010  . CONTUSION, LEFT FOOT 06/05/2010  . FOREIGN BODY, FOOT 04/17/2010    Past Surgical History:  Procedure Laterality Date  . FOOT SURGERY     had peice of wire removed  . IVC FILTER REMOVAL N/A 02/24/2017   Procedure: IVC Filter Removal;  Surgeon: [name], MD;  Location: [place] [facility];  Service:  Cardiovascular;  Laterality: N/A;  . PERIPHERAL VASCULAR CATHETERIZATION Right 10/22/2016   Procedure: Thrombectomy, thrombolyisis;  Surgeon: [name], MD;  Location: [place] [facility];  Service: Cardiovascular;  Laterality: Right;  . PERIPHERAL VASCULAR CATHETERIZATION N/A 10/22/2016   Procedure: IVC Filter Insertion;  Surgeon: [name], MD;  Location: [place] [facility];  Service: Cardiovascular;  Laterality: N/A;        Home Medications    Prior to Admission medications   Medication Sig Start Date End Date Taking? Authorizing Provider  amLODipine (NORVASC) 10 MG tablet Take 10 mg by mouth daily.    [provider]  ciprofloxacin (CIPRO) 500 MG tablet Take 1 tablet (500 mg total) by mouth 2 (two) times daily. One po bid x 7 days 10/16/18   Milton Ferguson, MD  gabapentin (NEURONTIN) 300 MG capsule Take 300 mg by mouth 3 (three) times daily.     [provider]  HYDROcodone-acetaminophen (NORCO/VICODIN) 5-325 MG tablet Take 1 tablet by mouth every 6 (six) hours as needed for moderate pain. 10/16/18   Milton Ferguson, MD  hydrOXYzine (ATARAX/VISTARIL) 50 MG tablet Take 50 mg by mouth 3 (three) times daily.    [provider]  mirtazapine (REMERON) 15 MG tablet Take 15 mg by mouth at bedtime.    [provider]  OLANZapine (ZYPREXA) 20 MG tablet Take 20 mg by mouth at bedtime.    [provider]  Rivaroxaban  15 & 20 MG TBPK Take as directed on package: Start with one 15mg  tablet by mouth twice a day with food. On Day 22, switch to one 20mg  tablet once a day with food. 10/10/18   Sherwood Gambler, MD    Family History Family History  Problem Relation Age of Onset  . Cancer Mother   . Heart attack Father     Social History Social History   Tobacco Use  . Smoking status: Current Every Day Smoker    Packs/day: 0.50  . Smokeless tobacco: Never Used  Substance Use Topics  . Alcohol use: Yes    Comment: weekly  . Drug use:  No     Allergies   Bee venom; Penicillins; and Sulfa antibiotics   Review of Systems Review of Systems  Constitutional: Negative for appetite change and fatigue.  HENT: Negative for congestion, ear discharge and sinus pressure.   Eyes: Negative for discharge.  Respiratory: Negative for cough.   Cardiovascular: Negative for chest pain.  Gastrointestinal: Positive for abdominal pain. Negative for diarrhea.  Genitourinary: Negative for frequency and hematuria.  Musculoskeletal: Negative for back pain.  Skin: Negative for rash.  Neurological: Negative for seizures and headaches.  Psychiatric/Behavioral: Negative for hallucinations.     Physical Exam Updated Vital Signs BP 130/82   Pulse 71   Temp 97.7 F (36.5 C) (Temporal)   Resp 17   Ht 5\' 11"  (1.803 m)   Wt 109.3 kg   SpO2 97%   BMI 33.61 kg/m   Physical Exam Vitals signs reviewed.  Constitutional:      Appearance: He is well-developed.  HENT:     Head: Normocephalic.     Nose: Nose normal.  Eyes:     General: No scleral icterus.    Conjunctiva/sclera: Conjunctivae normal.  Neck:     Musculoskeletal: Neck supple.     Thyroid: No thyromegaly.  Cardiovascular:     Rate and Rhythm: Normal rate and regular rhythm.     Heart sounds: No murmur. No friction rub. No gallop.   Pulmonary:     Breath sounds: No stridor. No wheezing or rales.  Chest:     Chest wall: No tenderness.  Abdominal:     General: There is no distension.     Tenderness: There is no abdominal tenderness. There is no rebound.     Comments: Tender right groin  Musculoskeletal: Normal range of motion.  Lymphadenopathy:     Cervical: No cervical adenopathy.  Skin:    Findings: No erythema or rash.  Neurological:     Mental Status: He is oriented to person, place, and time.     Motor: No abnormal muscle tone.     Coordination: Coordination normal.  Psychiatric:        Behavior: Behavior normal.      ED Treatments / Results  Labs (all  labs ordered are listed, but only abnormal results are displayed) Labs Reviewed  CBC WITH DIFFERENTIAL/PLATELET - Abnormal; Notable for the following components:      Result Value   Hemoglobin 17.1 (*)    MCH 34.1 (*)    All other components within normal limits  URINALYSIS, ROUTINE W REFLEX MICROSCOPIC - Abnormal; Notable for the following components:   Leukocytes, UA SMALL (*)    Bacteria, UA RARE (*)    All other components within normal limits  URINE CULTURE  COMPREHENSIVE METABOLIC PANEL    EKG None  Radiology Ct Abdomen Pelvis W Contrast  Result Date: 10/16/2018  CLINICAL DATA:  Right flank and right groin pain for 4 days. EXAM: CT ABDOMEN AND PELVIS WITH CONTRAST TECHNIQUE: Multidetector CT imaging of the abdomen and pelvis was performed using the standard protocol following bolus administration of intravenous contrast. CONTRAST:  159mL ISOVUE-300 IOPAMIDOL (ISOVUE-300) INJECTION 61% COMPARISON:  December 08, 2013 FINDINGS: Lower chest: Mild patchy opacity is identified in the anterior right lung base. This may be due to atelectasis but early developing pneumonia is not excluded. The heart size is normal. Hepatobiliary: No focal liver abnormality is seen. No gallstones, gallbladder wall thickening, or biliary dilatation. Pancreas: Unremarkable. No pancreatic ductal dilatation or surrounding inflammatory changes. Spleen: Normal in size without focal abnormality. Adrenals/Urinary Tract: Adrenal glands are unremarkable. Kidneys are normal, without renal calculi, focal lesion, or hydronephrosis. Bladder is unremarkable. Stomach/Bowel: Stomach is within normal limits. Appendix appears normal. No evidence of bowel wall thickening, distention, or inflammatory changes. Vascular/Lymphatic: No significant vascular findings are present. No enlarged abdominal or pelvic lymph nodes. Reproductive: Prostate is unremarkable. Other: No abdominal wall hernia or abnormality. No abdominopelvic ascites.  Musculoskeletal: Degenerative joint changes of the spine are identified. IMPRESSION: No nephrolithiasis or hydronephrosis identified bilaterally. No bowel obstruction.  The appendix is normal. Mild patchy opacity noted in the anterior right lung base. This may be due to atelectasis but early but developing pneumonia is not excluded. Electronically Signed   By: Abelardo Diesel M.D.   On: 10/16/2018 20:38    Procedures Procedures (including critical care time)  Medications Ordered in ED Medications  ciprofloxacin (CIPRO) tablet 500 mg (has no administration in time range)  iopamidol (ISOVUE-300) 61 % injection 100 mL (100 mLs Intravenous Contrast Given 10/16/18 2018)     Initial Impression / Assessment and Plan / ED Course  I have reviewed the triage vital signs and the nursing notes.  Pertinent labs & imaging results that were available during my care of the patient were reviewed by me and considered in my medical decision making (see chart for details).   Labs and CT scan unremarkable urine suggest UTI.  Patient will be placed on Bactrim and follow-up with his doctor   Final Clinical Impressions(s) / ED Diagnoses   Final diagnoses:  Acute cystitis without hematuria    ED Discharge Orders         Ordered    ciprofloxacin (CIPRO) 500 MG tablet  2 times daily     10/16/18 2052    HYDROcodone-acetaminophen (NORCO/VICODIN) 5-325 MG tablet  Every 6 hours PRN     10/16/18 2052           Milton Ferguson, MD 10/16/18 2056

## 2018-10-18 LAB — URINE CULTURE

## 2018-10-26 NOTE — Telephone Encounter (Signed)
Tried calling back to patient; still unable to reach as of 10/26/18 - line ringing busy.

## 2018-12-02 ENCOUNTER — Encounter: Payer: Self-pay | Admitting: Orthopedic Surgery

## 2018-12-02 ENCOUNTER — Ambulatory Visit: Payer: Medicaid Other | Admitting: Orthopedic Surgery

## 2018-12-02 VITALS — BP 122/76 | HR 69 | Ht 71.0 in | Wt 242.0 lb

## 2018-12-02 DIAGNOSIS — S80251A Superficial foreign body, right knee, initial encounter: Secondary | ICD-10-CM
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
## 2018-12-02 NOTE — Patient Instructions (Signed)
floow up after blood clot has resolved

## 2018-12-15 ENCOUNTER — Other Ambulatory Visit: Payer: Self-pay

## 2018-12-15 ENCOUNTER — Encounter (HOSPITAL_COMMUNITY): Payer: Self-pay

## 2018-12-15 ENCOUNTER — Emergency Department (HOSPITAL_COMMUNITY)
Admission: EM | Admit: 2018-12-15 | Discharge: 2018-12-15 | Disposition: A | Discharge status: Home / Self Care | Payer: Medicaid Other | Attending: Emergency Medicine | Admitting: Emergency Medicine

## 2018-12-15 ENCOUNTER — Emergency Department (HOSPITAL_COMMUNITY): Payer: Medicaid Other

## 2018-12-15 DIAGNOSIS — J45909 Unspecified asthma, uncomplicated: Secondary | ICD-10-CM | POA: Diagnosis not present

## 2018-12-15 DIAGNOSIS — Z7901 Long term (current) use of anticoagulants: Secondary | ICD-10-CM | POA: Diagnosis not present

## 2018-12-15 DIAGNOSIS — R1031 Right lower quadrant pain: Secondary | ICD-10-CM

## 2018-12-15 DIAGNOSIS — F17210 Nicotine dependence, cigarettes, uncomplicated: Secondary | ICD-10-CM | POA: Insufficient documentation

## 2018-12-15 DIAGNOSIS — I10 Essential (primary) hypertension: Secondary | ICD-10-CM | POA: Insufficient documentation

## 2018-12-15 DIAGNOSIS — I82511 Chronic embolism and thrombosis of right femoral vein: Secondary | ICD-10-CM | POA: Diagnosis not present

## 2018-12-15 DIAGNOSIS — R1030 Lower abdominal pain, unspecified: Secondary | ICD-10-CM | POA: Insufficient documentation

## 2018-12-15 LAB — URINALYSIS, ROUTINE W REFLEX MICROSCOPIC
Bilirubin Urine: NEGATIVE
Glucose, UA: NEGATIVE mg/dL
Hgb urine dipstick: NEGATIVE
Ketones, ur: NEGATIVE mg/dL
Nitrite: NEGATIVE
PROTEIN: NEGATIVE mg/dL
Specific Gravity, Urine: 1.017 (ref 1.005–1.030)
pH: 7 (ref 5.0–8.0)

## 2018-12-15 MED ORDER — HYDROCODONE-ACETAMINOPHEN 5-325 MG PO TABS: 2.0000 | ORAL_TABLET | Freq: Four times a day (QID) | ORAL | 0 refills | Status: DC | PRN

## 2018-12-15 MED ORDER — ONDANSETRON 4 MG PO TBDP: 4.0000 mg | ORAL_TABLET | Freq: Three times a day (TID) | ORAL | 0 refills | Status: DC | PRN

## 2018-12-15 MED ORDER — ONDANSETRON 4 MG PO TBDP
4.0000 mg | ORAL_TABLET | Freq: Once | ORAL | Status: AC
  Administered 2018-12-15: 4 mg via ORAL
  Filled 2018-12-15: qty 1

## 2018-12-15 MED ORDER — HYDROCODONE-ACETAMINOPHEN 5-325 MG PO TABS
1.0000 | ORAL_TABLET | Freq: Once | ORAL | Status: AC
  Administered 2018-12-15: 1 via ORAL
  Filled 2018-12-15: qty 1

## 2018-12-15 NOTE — Discharge Instructions (Signed)
You have a chronic DVT in the right groin which is likely causing your pain. I am treating your pain symptoms and will have you call the Vascular Surgeon tomorrow to schedule an appointment. Do not take Hydrocodone while driving as this can cause drowsiness. Take only as needed for severe pain. This may also cause constipation so you may need to take a stool softener while using this medication.

## 2018-12-15 NOTE — ED Provider Notes (Signed)
Emergency Department Provider Note   I have reviewed the triage vital signs and the nursing notes.   HISTORY  Chief Complaint Groin Pain   HPI Lance Cohen is a 48 y.o. male with PMH of DVT on Xarelto, Asthma, Bipolar disorder, and HTN presents to the emergency department for evaluation of worsening right groin pain.  Patient has known DVT in this location but states that pain is worsening.  He has had a prior DVT which required intervention from vascular surgery.  Patient states he was diagnosed with his current, right groin DVT in December and has been compliant with his medications.  He no longer has an IVC filter.  He was previously managed by a vascular surgery group in [place]. With this most recent DVT he was referred to vascular surgery in [place] but, according to the patient, was told that they would not see him because it was not completely occluding the vessel.  He has followed up with the health department to try and get referral to the [facility] group for second opinion but has not heard anything back.  He has pain in the groin which is worse with standing or moving.  No pain extending down the right leg.  No significant leg swelling.  He does have some occasional pain with urination denies fevers or chills.  He reports that occasionally the pain is to the point where he has nausea and vomiting but denies abdominal or flank pain.  His current pain feels similar to his prior episodes of DVT related pain.   Past Medical History:  Diagnosis Date  . Asthma   . Bipolar 1 disorder ([place])   . Chronic knee pain   . DVT (deep venous thrombosis) ([place])   . Hypertension   . Suicide attempt [facility])    2010 Intentional overdose attempt with Depakote.    Patient Active Problem List   Diagnosis Date Noted  . Chest pain 10/19/2017  . Asthma 10/19/2017  . Hypertension 10/19/2017  . Chronic knee pain 10/19/2017  . Pain in limb 12/27/2016  . Acute pulmonary embolism ([place])  10/23/2016  . Nausea and vomiting 10/23/2016  . S/P IVC filter 10/23/2016  . Tobacco abuse counseling 10/23/2016  . DVT (deep venous thrombosis) ([place]) 10/18/2016  . Cellulitis of foot 06/24/2011  . Bipolar 1 disorder ([place]) 06/24/2011  . Tobacco abuse 06/24/2011  . Polycythemia 06/24/2011  . Tinea pedis 06/24/2011  . CARPAL TUNNEL SYNDROME, RIGHT 06/21/2010  . CONTUSION, LEFT FOOT 06/05/2010  . FOREIGN BODY, FOOT 04/17/2010    Past Surgical History:  Procedure Laterality Date  . FOOT SURGERY     had peice of wire removed  . IVC FILTER REMOVAL N/A 02/24/2017   Procedure: IVC Filter Removal;  Surgeon: [name], MD;  Location: [facility];  Service: Cardiovascular;  Laterality: N/A;  . PERIPHERAL VASCULAR CATHETERIZATION Right 10/22/2016   Procedure: Thrombectomy, thrombolyisis;  Surgeon: [name], MD;  Location: [facility];  Service: Cardiovascular;  Laterality: Right;  . PERIPHERAL VASCULAR CATHETERIZATION N/A 10/22/2016   Procedure: IVC Filter Insertion;  Surgeon: [name], MD;  Location: [facility];  Service: Cardiovascular;  Laterality: N/A;   Allergies Bee venom; Penicillins; and Sulfa antibiotics  Family History  Problem Relation Age of Onset  . Cancer Mother   . Heart attack Father     Social History Social History   Tobacco Use  . Smoking status: Current Every Day Smoker  Packs/day: 0.50  . Smokeless tobacco: Never Used  Substance Use Topics  . Alcohol use: Yes    Comment: weekly  . Drug use: No    Review of Systems  Constitutional: No fever/chills Eyes: No visual changes. ENT: No sore throat. Cardiovascular: Denies chest pain. Respiratory: Denies shortness of breath. Gastrointestinal: No abdominal pain.  No nausea, no vomiting.  No diarrhea.  No constipation. Genitourinary: Negative for dysuria. Positive right groin pain.  Musculoskeletal: Negative for back pain. Skin: Negative for rash. Neurological:  Negative for headaches, focal weakness or numbness.  10-point ROS otherwise negative.  ____________________________________________   PHYSICAL EXAM:  VITAL SIGNS: ED Triage Vitals  Enc Vitals Group     BP 12/15/18 1339 139/87     Pulse Rate 12/15/18 1339 79     Resp 12/15/18 1339 16     Temp 12/15/18 1339 97.9 F (36.6 C)     Temp Source 12/15/18 1339 Oral     SpO2 12/15/18 1339 98 %     Weight 12/15/18 1340 245 lb (111.1 kg)     Height 12/15/18 1340 5\' 11"  (1.803 m)     Pain Score 12/15/18 1340 10   Constitutional: Alert and oriented. Well appearing and in no acute distress. Eyes: Conjunctivae are normal. Head: Atraumatic. Nose: No congestion/rhinnorhea. Mouth/Throat: Mucous membranes are moist.  Neck: No stridor.   Cardiovascular: Normal rate, regular rhythm. Good peripheral circulation. Grossly normal heart sounds.   Respiratory: Normal respiratory effort.  No retractions. Lungs CTAB. Gastrointestinal: Soft and nontender. No distention.  Genitourinary: Exam performed with patient consent.  No palpable inguinal hernia or tenderness over the right testicle.  No scrotal cellulitis or swelling.  Musculoskeletal: No lower extremity tenderness nor edema. No gross deformities of extremities. Neurologic:  Normal speech and language. No gross focal neurologic deficits are appreciated.  Skin:  Skin is warm, dry and intact. No rash noted.  ____________________________________________   LABS (all labs ordered are listed, but only abnormal results are displayed)  Labs Reviewed  URINALYSIS, ROUTINE W REFLEX MICROSCOPIC - Abnormal; Notable for the following components:      Result Value   Leukocytes,Ua SMALL (*)    Bacteria, UA RARE (*)    All other components within normal limits  URINE CULTURE   ____________________________________________  RADIOLOGY  US Venous Img Lower Unilateral Right  Result Date: 12/15/2018 CLINICAL DATA:  Right lower extremity pain and the past 2  weeks. History of previous DVT and subsequent thrombectomy in January 2018. Patient is currently on anticoagulation. Evaluate for acute or chronic DVT. EXAM: RIGHT LOWER EXTREMITY VENOUS DOPPLER ULTRASOUND TECHNIQUE: Gray-scale sonography with graded compression, as well as color Doppler and duplex ultrasound were performed to evaluate the lower extremity deep venous systems from the level of the common femoral vein and including the common femoral, femoral, profunda femoral, popliteal and calf veins including the posterior tibial, peroneal and gastrocnemius veins when visible. The superficial great saphenous vein was also interrogated. Spectral Doppler was utilized to evaluate flow at rest and with distal augmentation maneuvers in the common femoral, femoral and popliteal veins. COMPARISON:  None. FINDINGS: Contralateral Common Femoral Vein: Respiratory phasicity is normal and symmetric with the symptomatic side. No evidence of thrombus. Normal compressibility. Common Femoral Vein: No evidence of acute or chronic thrombus. Normal compressibility, respiratory phasicity and response to augmentation. Saphenofemoral Junction: No evidence of acute or chronic thrombus. Normal compressibility and flow on color Doppler imaging. Profunda Femoral Vein: No evidence of acute or chronic thrombus. Normal  compressibility and flow on color Doppler imaging. Femoral Vein: There is mixed echogenic occlusive DVT involving the proximal (image 17), mid (image 20) and distal (image 22) aspects of the right femoral vein, grossly unchanged compared to the 09/2018 examination. Popliteal Vein: There is hypoechoic near occlusive thrombus within the right popliteal vein (image 27), similar to the 09/2018 examination. Calf Veins: Appear patent where imaged without evidence of acute or chronic DVT. Superficial Great Saphenous Vein: No evidence of thrombus. Normal compressibility. Other Findings:  None. IMPRESSION: 1. No definite evidence of  acute DVT within the right lower extremity. 2. 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 Electronically Signed   By: Sandi Mariscal M.D.   On: 12/15/2018 15:32    ____________________________________________   PROCEDURES  Procedure(s) performed:   Procedures  None  ____________________________________________   INITIAL IMPRESSION / ASSESSMENT AND PLAN / ED COURSE  Pertinent labs & imaging results that were available during my care of the patient were reviewed by me and considered in my medical decision making (see chart for details).  Patient with known right femoral vein thrombus on Xarelto presents with worsening pain.  Patient is seeking contact information for the [facility] vascular surgery team who intervened on his last DVT as he was told by the [facility] group they could not see him.  Ultrasound of the right leg shows chronic, predominantly occlusive DVT in the right femoral and popliteal veins which are similar to his prior exam.  No evidence of acute DVT.  Patient denies any chest pain or shortness of breath.  He does report some dysuria.  Plan for urine analysis along with oral pain and nausea medication.  I do not have evidence of hernia or testicular pathology on exam.  No further imaging at this time.  Plan to provide the patient contact information to the vascular surgery group in [place] who have seen him in the past.  I reviewed the [facility] drug database and will provide a very small number of Vicodin tablets for treatment of acute pain along with Zofran.  UA negative. Patient discharged with plan for PCP and Vascular Surgery follow up.  ____________________________________________  FINAL CLINICAL IMPRESSION(S) / ED DIAGNOSES  Final diagnoses:  Right groin pain  Chronic deep vein thrombosis (DVT) of femoral vein of right lower extremity (HCC)     MEDICATIONS GIVEN DURING THIS  VISIT:  Medications  ondansetron (ZOFRAN-ODT) disintegrating tablet 4 mg (4 mg Oral Given 12/15/18 1736)  HYDROcodone-acetaminophen (NORCO/VICODIN) 5-325 MG per tablet 1 tablet (1 tablet Oral Given 12/15/18 1736)     NEW OUTPATIENT MEDICATIONS STARTED DURING THIS VISIT:  Discharge Medication List as of 12/15/2018  7:27 PM    START taking these medications   Details  ondansetron (ZOFRAN ODT) 4 MG disintegrating tablet Take 1 tablet (4 mg total) by mouth every 8 (eight) hours as needed for nausea or vomiting., Starting Tue 12/15/2018, Normal        Note:  This document was prepared using Dragon voice recognition software and may include unintentional dictation errors.  Nanda Quinton, MD Emergency Medicine    Long, [facility], MD 12/16/18 [phone]

## 2018-12-15 NOTE — ED Triage Notes (Signed)
Patient reports he was diagnosed with a blood clot in his groin. States that he is experiencing pelvic and groin pain for 2 weeks.

## 2018-12-17 LAB — URINE CULTURE
Culture: <10000 — AB
Special Requests: NORMAL

## 2018-12-22 ENCOUNTER — Ambulatory Visit (INDEPENDENT_AMBULATORY_CARE_PROVIDER_SITE_OTHER): Payer: Medicaid Other | Admitting: Vascular Surgery

## 2018-12-22 ENCOUNTER — Encounter (INDEPENDENT_AMBULATORY_CARE_PROVIDER_SITE_OTHER): Payer: Self-pay | Admitting: Vascular Surgery

## 2018-12-22 VITALS — BP 117/71 | HR 76 | Resp 17 | Ht 71.0 in | Wt 237.0 lb

## 2018-12-22 DIAGNOSIS — M79604 Pain in right leg: Secondary | ICD-10-CM

## 2018-12-22 DIAGNOSIS — Z79899 Other long term (current) drug therapy: Secondary | ICD-10-CM

## 2018-12-22 DIAGNOSIS — I10 Essential (primary) hypertension: Secondary | ICD-10-CM | POA: Diagnosis not present

## 2018-12-22 DIAGNOSIS — F17200 Nicotine dependence, unspecified, uncomplicated: Secondary | ICD-10-CM

## 2018-12-22 DIAGNOSIS — G8929 Other chronic pain: Secondary | ICD-10-CM | POA: Diagnosis not present

## 2018-12-22 DIAGNOSIS — I82511 Chronic embolism and thrombosis of right femoral vein: Secondary | ICD-10-CM

## 2018-12-22 NOTE — Assessment & Plan Note (Signed)
The patient has chronic right leg pain.  A postphlebitic component to this pain could certainly be present.  There is no vascular surgery or intervention that would be of any benefit at this time.  I recommended he wear compression stockings, elevate his legs, and increase his activity as tolerated.

## 2018-12-22 NOTE — Progress Notes (Signed)
MRN : [id number]  Lance Cohen is a 48 y.o. (July 05, 1971) male who presents with chief complaint of  Chief Complaint  Patient presents with  . Follow-up    dvt found at [facility]  .  History of Present Illness: Patient returns today in follow up on referral from a recent ultrasound at [facility].  He continues to complain of a lot of pain.  I have reviewed his recent ultrasound as well as the ultrasound done towards the end of last year.  He has only chronic appearing DVT which is occlusive to near occlusive in the right femoral region.  He apparently was seen by the vascular group in [place] who recommended no intervention be done due to the chronic nature of the clot.  He underwent a thrombectomy by my partner about 2 years ago initially for his DVT and subsequently had his filter removed about a year and a half ago.  He has not really been seen since that time.  No chest pain or shortness of breath.  Chronic right leg pain which is steady and persistent.  Current Outpatient Medications  Medication Sig Dispense Refill  . amLODipine (NORVASC) 10 MG tablet Take 10 mg by mouth daily.    Marland Kitchen gabapentin (NEURONTIN) 300 MG capsule Take 300 mg by mouth 3 (three) times daily.     . hydrOXYzine (ATARAX/VISTARIL) 50 MG tablet Take 50 mg by mouth 3 (three) times daily.    . mirtazapine (REMERON) 15 MG tablet Take 15 mg by mouth at bedtime.    Marland Kitchen OLANZapine (ZYPREXA) 20 MG tablet Take 20 mg by mouth at bedtime.    . ondansetron (ZOFRAN ODT) 4 MG disintegrating tablet Take 1 tablet (4 mg total) by mouth every 8 (eight) hours as needed for nausea or vomiting. 20 tablet 0  . Rivaroxaban 15 & 20 MG TBPK Take as directed on package: Start with one 15mg  tablet by mouth twice a day with food. On Day 22, switch to one 20mg  tablet once a day with food. 51 each 0  . HYDROcodone-acetaminophen (NORCO/VICODIN) 5-325 MG tablet Take 2 tablets by mouth every 6 (six) hours as needed for severe pain.  (Patient not taking: Reported on 12/22/2018) 12 tablet 0   No current facility-administered medications for this visit.     Past Medical History:  Diagnosis Date  . Asthma   . Bipolar 1 disorder ([place])   . Chronic knee pain   . DVT (deep venous thrombosis) ([place])   . Hypertension   . Suicide attempt [facility])    2010 Intentional overdose attempt with Depakote.    Past Surgical History:  Procedure Laterality Date  . FOOT SURGERY     had peice of wire removed  . IVC FILTER REMOVAL N/A 02/24/2017   Procedure: IVC Filter Removal;  Surgeon: [name], MD;  Location: [place] [facility];  Service: Cardiovascular;  Laterality: N/A;  . PERIPHERAL VASCULAR CATHETERIZATION Right 10/22/2016   Procedure: Thrombectomy, thrombolyisis;  Surgeon: [name], MD;  Location: [place] [facility];  Service: Cardiovascular;  Laterality: Right;  . PERIPHERAL VASCULAR CATHETERIZATION N/A 10/22/2016   Procedure: IVC Filter Insertion;  Surgeon: [name], MD;  Location: [place] [facility];  Service: Cardiovascular;  Laterality: N/A;    Social History  Substance Use Topics  . Smoking status: Current Every Day Smoker    Packs/day: 0.50  . Smokeless tobacco: Never Used  . Alcohol use Yes  Comment: occasionally    Family History      Family History  Problem Relation Age of Onset  . Cancer Mother   . Heart attack Father          Allergies  Allergen Reactions  . Bee Venom Swelling  . Penicillins Other (See Comments)    PT states that he had symptoms that felt like he was having a heart attack Has patient had a PCN reaction causing immediate rash, facial/tongue/throat swelling, SOB or lightheadedness with hypotension: No Has patient had a PCN reaction causing severe rash involving mucus membranes or skin necrosis: No Has patient had a PCN reaction that required hospitalization No Has patient had a PCN reaction occurring within the last 10 years: No If all of  the above answers are "NO", then may proceed with Cephal  . Sulfa Antibiotics Itching     REVIEW OF SYSTEMS(Negative unless checked)  Constitutional: [] ?Weight loss[] ?Fever[] ?Chills Cardiac:[] ?Chest pain[] ?Chest pressure[] ?Palpitations [] ?Shortness of breath when laying flat [] ?Shortness of breath at rest [] ?Shortness of breath with exertion. Vascular: [x] ?Pain in legs with walking[x] ?Pain in legsat rest[] ?Pain in legs when laying flat [] ?Claudication [] ?Pain in feet when walking [] ?Pain in feet at rest [] ?Pain in feet when laying flat [x] ?History of DVT [x] ?Phlebitis [x] ?Swelling in legs [] ?Varicose veins [] ?Non-healing ulcers Pulmonary: [] ?Uses home oxygen [] ?Productive cough[] ?Hemoptysis [] ?Wheeze [] ?COPD [] ?Asthma Neurologic: [] ?Dizziness [] ?Blackouts [] ?Seizures [] ?History of stroke [] ?History of TIA[] ?Aphasia [] ?Temporary blindness[] ?Dysphagia [] ?Weaknessor numbness in arms [] ?Weakness or numbnessin legs Musculoskeletal: [] ?Arthritis [] ?Joint swelling [] ?Joint pain [] ?Low back pain Hematologic:[] ?Easy bruising[] ?Easy bleeding [] ?Hypercoagulable state [] ?Anemic  Gastrointestinal:[] ?Blood in stool[] ?Vomiting blood[] ?Gastroesophageal reflux/heartburn[] ?Abdominal pain Genitourinary: [] ?Chronic kidney disease [] ?Difficulturination [] ?Frequenturination [] ?Burning with urination[] ?Hematuria Skin: [] ?Rashes [] ?Ulcers [] ?Wounds Psychological: [] ?History of anxiety[] ?History of major depression.    Physical Examination  BP 117/71 (BP Location: Right Arm)   Pulse 76   Resp 17   Ht 5\' 11"  (1.803 m)   Wt 237 lb (107.5 kg)   BMI 33.05 kg/m  Gen:  WD/WN, NAD.  Somewhat disheveled and appears older than stated age Head: /AT, No temporalis wasting. Ear/Nose/Throat: Hearing grossly intact, nares w/o erythema or drainage Eyes: Conjunctiva clear. Sclera non-icteric Neck:  Supple.  Trachea midline Pulmonary:  Good air movement, no use of accessory muscles.  Cardiac: RRR, no JVD Vascular:  Vessel Right Left  Radial Palpable Palpable                          PT Palpable Palpable  DP Palpable Palpable    Musculoskeletal: M/S 5/5 throughout.  No deformity or atrophy.  1+ right lower extremity edema. Neurologic: Sensation grossly intact in extremities.  Symmetrical.  Speech is fluent.  Psychiatric: Judgment intact, Mood & affect appropriate for pt's clinical situation. Dermatologic: No rashes or ulcers noted.  No cellulitis or open wounds.       Labs Recent Results (from the past 2160 hour(s))  CBC     Status: Abnormal   Collection Time: 10/10/18 11:19 AM  Result Value Ref Range   WBC 9.7 4.0 - 10.5 K/uL   RBC 4.95 4.22 - 5.81 MIL/uL   Hemoglobin 16.6 13.0 - 17.0 g/dL   HCT 49.9 39.0 - 52.0 %   MCV 100.8 (H) 80.0 - 100.0 fL   MCH 33.5 26.0 - 34.0 pg   MCHC 33.3 30.0 - 36.0 g/dL   RDW 12.4 11.5 - 15.5 %   Platelets 245 150 - 400 K/uL   nRBC 0.0 0.0 - 0.2 %    Comment: Performed  at [facility], [address] [place], [place] [address]  Basic metabolic panel     Status: Abnormal   Collection Time: 10/10/18 11:19 AM  Result Value Ref Range   Sodium 137 135 - 145 mmol/L   Potassium 4.9 3.5 - 5.1 mmol/L   Chloride 104 98 - 111 mmol/L   CO2 28 22 - 32 mmol/L   Glucose, Bld 114 (H) 70 - 99 mg/dL   BUN 10 6 - 20 mg/dL   Creatinine, Ser 1.33 (H) 0.61 - 1.24 mg/dL   Calcium 9.0 8.9 - 10.3 mg/dL   GFR calc non Af Amer >60 >60 mL/min   GFR calc Af Amer >60 >60 mL/min   Anion gap 5 5 - 15    Comment: Performed at [facility], [address], [place], [place] [address]  CBC with Differential/Platelet     Status: Abnormal   Collection Time: 10/16/18  6:53 PM  Result Value Ref Range   WBC 10.2 4.0 - 10.5 K/uL   RBC 5.02 4.22 - 5.81 MIL/uL   Hemoglobin 17.1 (H) 13.0 - 17.0 g/dL   HCT 49.3 39.0 - 52.0 %   MCV 98.2 80.0 - 100.0 fL   MCH 34.1  (H) 26.0 - 34.0 pg   MCHC 34.7 30.0 - 36.0 g/dL   RDW 12.4 11.5 - 15.5 %   Platelets 258 150 - 400 K/uL   nRBC 0.0 0.0 - 0.2 %   Neutrophils Relative % 61 %   Neutro Abs 6.2 1.7 - 7.7 K/uL   Lymphocytes Relative 28 %   Lymphs Abs 2.8 0.7 - 4.0 K/uL   Monocytes Relative 8 %   Monocytes Absolute 0.8 0.1 - 1.0 K/uL   Eosinophils Relative 2 %   Eosinophils Absolute 0.2 0.0 - 0.5 K/uL   Basophils Relative 1 %   Basophils Absolute 0.1 0.0 - 0.1 K/uL   Immature Granulocytes 0 %   Abs Immature Granulocytes 0.02 0.00 - 0.07 K/uL    Comment: Performed at [facility] [address], [place], [place] [address]  Comprehensive metabolic panel     Status: None   Collection Time: 10/16/18  6:53 PM  Result Value Ref Range   Sodium 137 135 - 145 mmol/L   Potassium 4.2 3.5 - 5.1 mmol/L   Chloride 106 98 - 111 mmol/L   CO2 23 22 - 32 mmol/L   Glucose, Bld 92 70 - 99 mg/dL   BUN 13 6 - 20 mg/dL   Creatinine, Ser 1.22 0.61 - 1.24 mg/dL   Calcium 8.9 8.9 - 10.3 mg/dL   Total Protein 8.0 6.5 - 8.1 g/dL   Albumin 4.2 3.5 - 5.0 g/dL   AST 24 15 - 41 U/L   ALT 24 0 - 44 U/L   Alkaline Phosphatase 68 38 - 126 U/L   Total Bilirubin 0.4 0.3 - 1.2 mg/dL   GFR calc non Af Amer >60 >60 mL/min   GFR calc Af Amer >60 >60 mL/min   Anion gap 8 5 - 15    Comment: Performed at [facility], [address] [place], [place] [address]  Urinalysis, Routine w reflex microscopic     Status: Abnormal   Collection Time: 10/16/18  7:50 PM  Result Value Ref Range   Color, Urine YELLOW YELLOW   APPearance CLEAR CLEAR   Specific Gravity, Urine 1.013 1.005 - 1.030   pH 6.0 5.0 - 8.0   Glucose, UA NEGATIVE NEGATIVE mg/dL   Hgb urine  dipstick NEGATIVE NEGATIVE   Bilirubin Urine NEGATIVE NEGATIVE   Ketones, ur NEGATIVE NEGATIVE mg/dL   Protein, ur NEGATIVE NEGATIVE mg/dL   Nitrite NEGATIVE NEGATIVE   Leukocytes, UA SMALL (A) NEGATIVE   RBC / HPF 0-5 0 - 5 RBC/hpf   WBC, UA 21-50 0 - 5 WBC/hpf   Bacteria, UA RARE  (A) NONE SEEN   Squamous Epithelial / LPF 0-5 0 - 5    Comment: Performed at [facility], [address] [place], [place] [address]  Urine Culture     Status: Abnormal   Collection Time: 10/16/18  7:50 PM  Result Value Ref Range   Specimen Description      URINE, CLEAN CATCH Performed at [facility], [address] [place], [place] [address]    Special Requests      NONE Performed at [facility], [address] [place], [place] [address]    Culture (A)     <10,000 COLONIES/mL INSIGNIFICANT GROWTH Performed at [facility] [address] [place], [place] [address]    Report Status 10/18/2018 FINAL   Urinalysis, Routine w reflex microscopic     Status: Abnormal   Collection Time: 12/15/18  6:10 PM  Result Value Ref Range   Color, Urine YELLOW YELLOW   APPearance CLEAR CLEAR   Specific Gravity, Urine 1.017 1.005 - 1.030   pH 7.0 5.0 - 8.0   Glucose, UA NEGATIVE NEGATIVE mg/dL   Hgb urine dipstick NEGATIVE NEGATIVE   Bilirubin Urine NEGATIVE NEGATIVE   Ketones, ur NEGATIVE NEGATIVE mg/dL   Protein, ur NEGATIVE NEGATIVE mg/dL   Nitrite NEGATIVE NEGATIVE   Leukocytes,Ua SMALL (A) NEGATIVE   RBC / HPF 0-5 0 - 5 RBC/hpf   WBC, UA 6-10 0 - 5 WBC/hpf   Bacteria, UA RARE (A) NONE SEEN   Squamous Epithelial / LPF 0-5 0 - 5   Mucus PRESENT     Comment: Performed at [facility], [address], [place], [place] [address]  Urine culture     Status: Abnormal   Collection Time: 12/15/18  6:10 PM  Result Value Ref Range   Specimen Description      URINE, CLEAN CATCH Performed at [facility], [address], [place], [place] [address]    Special Requests      Normal Performed at [facility], [address] [place], [place] [address]    Culture (A)     <10,000 COLONIES/mL INSIGNIFICANT GROWTH Performed at [facility] [address], [place], [place] [address]    Report Status 12/17/2018 FINAL     Radiology US Venous Img Lower Unilateral Right  Result Date:  12/15/2018 CLINICAL DATA:  Right lower extremity pain and the past 2 weeks. History of previous DVT and subsequent thrombectomy in January 2018. Patient is currently on anticoagulation. Evaluate for acute or chronic DVT. EXAM: RIGHT LOWER EXTREMITY VENOUS DOPPLER ULTRASOUND TECHNIQUE: Gray-scale sonography with graded compression, as well as color Doppler and duplex ultrasound were performed to evaluate the lower extremity deep venous systems from the level of the common femoral vein and including the common femoral, femoral, profunda femoral, popliteal and calf veins including the posterior tibial, peroneal and gastrocnemius veins when visible. The superficial great saphenous vein was also interrogated. Spectral Doppler was utilized to evaluate flow at rest and with distal augmentation maneuvers in the common femoral, femoral and popliteal veins. COMPARISON:  None. FINDINGS: Contralateral Common Femoral Vein: Respiratory phasicity is normal and symmetric with the symptomatic  side. No evidence of thrombus. Normal compressibility. Common Femoral Vein: No evidence of acute or chronic thrombus. Normal compressibility, respiratory phasicity and response to augmentation. Saphenofemoral Junction: No evidence of acute or chronic thrombus. Normal compressibility and flow on color Doppler imaging. Profunda Femoral Vein: No evidence of acute or chronic thrombus. Normal compressibility and flow on color Doppler imaging. Femoral Vein: There is mixed echogenic occlusive DVT involving the proximal (image 17), mid (image 20) and distal (image 22) aspects of the right femoral vein, grossly unchanged compared to the 09/2018 examination. Popliteal Vein: There is hypoechoic near occlusive thrombus within the right popliteal vein (image 27), similar to the 09/2018 examination. Calf Veins: Appear patent where imaged without evidence of acute or chronic DVT. Superficial Great Saphenous Vein: No evidence of thrombus. Normal  compressibility. Other Findings:  None. IMPRESSION: 1. No definite evidence of acute DVT within the right lower extremity. 2. 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 Electronically Signed   By: Sandi Mariscal M.D.   On: 12/15/2018 15:32    Assessment/Plan  Pain in limb The patient has chronic right leg pain.  A postphlebitic component to this pain could certainly be present.  There is no vascular surgery or intervention that would be of any benefit at this time.  I recommended he wear compression stockings, elevate his legs, and increase his activity as tolerated.  Hypertension blood pressure control important in reducing the progression of atherosclerotic disease. On appropriate oral medications.   DVT (deep venous thrombosis) ([place]) I have reviewed his recent ultrasound as well as the ultrasound done towards the end of last year.  He has only chronic appearing DVT which is occlusive to near occlusive in the right femoral region.  At this point, aspirin therapy alone is reasonable and less his primary care provider would prefer continued anticoagulation.  Compression, elevation, and activity to avoid postphlebitic symptoms.  There is no role for intervention either surgical or interventional that would be of benefit at this point.  He can return as needed    Leotis Pain, MD  12/22/2018 11:05 AM    This note was created with Dragon medical transcription system.  Any errors from dictation are purely unintentional

## 2018-12-22 NOTE — Assessment & Plan Note (Signed)
I have reviewed his recent ultrasound as well as the ultrasound done towards the end of last year.  He has only chronic appearing DVT which is occlusive to near occlusive in the right femoral region.  At this point, aspirin therapy alone is reasonable and less his primary care provider would prefer continued anticoagulation.  Compression, elevation, and activity to avoid postphlebitic symptoms.  There is no role for intervention either surgical or interventional that would be of benefit at this point.  He can return as needed

## 2018-12-22 NOTE — Assessment & Plan Note (Signed)
blood pressure control important in reducing the progression of atherosclerotic disease. On appropriate oral medications.  

## 2019-01-20 ENCOUNTER — Encounter: Payer: Self-pay | Admitting: Orthopedic Surgery

## 2019-01-20 NOTE — Progress Notes (Unsigned)
Chief Complaint  Patient presents with  . Leg Pain    Foreign body right knee   This notice in response to a letter dated January 08, 2019 from [place] with Dr. Nadara Mustard of [facility] as the overseeing MD  This patient has a foreign body in his right leg he is on Xarelto he had a recent ultrasound which showed an active clot  While we appreciate the information regarding stopping the Xarelto (for arthroscopy stop 3 days prior to surgery restart 24 hours after surgery.  For high risk surgery stop anticoagulants 5 days before surgery restart 24 hours after surgery)  With an active clot it is on wise to proceed with elective surgery.  Once the patient has documented resolution of the clot then we could proceed with removing the foreign body

## 2019-02-08 ENCOUNTER — Inpatient Hospital Stay (HOSPITAL_COMMUNITY): Payer: Medicaid Other | Attending: Hematology | Admitting: Hematology

## 2019-02-08 ENCOUNTER — Other Ambulatory Visit: Payer: Self-pay

## 2019-02-08 ENCOUNTER — Inpatient Hospital Stay (HOSPITAL_COMMUNITY): Payer: Medicaid Other

## 2019-02-08 ENCOUNTER — Encounter (HOSPITAL_COMMUNITY): Payer: Self-pay | Admitting: Hematology

## 2019-02-08 VITALS — BP 131/93 | HR 55 | Temp 98.6°F | Wt 242.0 lb

## 2019-02-08 DIAGNOSIS — Z86711 Personal history of pulmonary embolism: Secondary | ICD-10-CM | POA: Diagnosis not present

## 2019-02-08 DIAGNOSIS — N4830 Priapism, unspecified: Secondary | ICD-10-CM | POA: Diagnosis not present

## 2019-02-08 DIAGNOSIS — R634 Abnormal weight loss: Secondary | ICD-10-CM | POA: Diagnosis not present

## 2019-02-08 DIAGNOSIS — Z7901 Long term (current) use of anticoagulants: Secondary | ICD-10-CM | POA: Diagnosis not present

## 2019-02-08 DIAGNOSIS — D759 Disease of blood and blood-forming organs, unspecified: Secondary | ICD-10-CM

## 2019-02-08 DIAGNOSIS — F17210 Nicotine dependence, cigarettes, uncomplicated: Secondary | ICD-10-CM | POA: Insufficient documentation

## 2019-02-08 DIAGNOSIS — Z86718 Personal history of other venous thrombosis and embolism: Secondary | ICD-10-CM | POA: Insufficient documentation

## 2019-02-08 DIAGNOSIS — Z87891 Personal history of nicotine dependence: Secondary | ICD-10-CM

## 2019-02-08 NOTE — Progress Notes (Signed)
CONSULT NOTE  Patient Care Team: Muse, Noel Journey., PA-C as PCP - General Fields, Marga Melnick, MD as Consulting Physician (Gastroenterology)  CHIEF COMPLAINTS/PURPOSE OF CONSULTATION:  Hyperviscosity syndrome  HISTORY OF PRESENTING ILLNESS:  Lance Cohen 48 y.o. 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 He states he was diagnosed with a right femoral thrombus approximately 3-4 mths ago. He was started on Xarelto. He states when he was first started on Xarelto he developed bright red blood per rectum during bowl movements. He attributed this to his history of hemorrhoids. He states this occurs occasionally. He has a hx of DVT and bilateral pulmonary emboli in 2017. He was on anticoagulation therapy for 6 mths. He states he was active during the episodes of clot.    In the interm, he has developed painful priapism. He states an erection can last 1-12 hours. Zyprexa has been discontinued. He also admits to 10 lb weight loss over the past 3 mths despite stable appetite. He is a current every day smoker. He smokes 1/2 pack of cigarettes a day for greater than 20 years. He also admits to smoking blunts of marijuana daily.   He denies any significant edema. No change or loss of vision. Denies tinnitus, vertigo, or ataxia. Denies any CP, SOB, lightheadedness or dizziness. Has a family history significant for breast cancer in his mother.   MEDICAL HISTORY:  Past Medical History:  Diagnosis Date  . Asthma   . Bipolar 1 disorder ([place])   . Chronic knee pain   . DVT (deep venous thrombosis) ([place])   . Hypertension   . Suicide attempt [facility])    2010 Intentional overdose attempt with Depakote.    SURGICAL HISTORY: Past Surgical History:  Procedure Laterality Date  . FOOT SURGERY     had peice of wire removed  . IVC FILTER REMOVAL N/A 02/24/2017   Procedure: IVC Filter Removal;  Surgeon: [name], MD;  Location: [place] [facility];  Service: Cardiovascular;   Laterality: N/A;  . PERIPHERAL VASCULAR CATHETERIZATION Right 10/22/2016   Procedure: Thrombectomy, thrombolyisis;  Surgeon: [name], MD;  Location: [place] [facility];  Service: Cardiovascular;  Laterality: Right;  . PERIPHERAL VASCULAR CATHETERIZATION N/A 10/22/2016   Procedure: IVC Filter Insertion;  Surgeon: [name], MD;  Location: [place] [facility];  Service: Cardiovascular;  Laterality: N/A;    SOCIAL HISTORY: Social History   Socioeconomic History  . Marital status: Married    Spouse name: Not on file  . Number of children: Not on file  . Years of education: Not on file  . Highest education level: Not on file  Occupational History  . Not on file  Social Needs  . Financial resource strain: Not on file  . Food insecurity:    Worry: Not on file    Inability: Not on file  . Transportation needs:    Medical: Not on file    Non-medical: Not on file  Tobacco Use  . Smoking status: Current Every Day Smoker    Packs/day: 0.50  . Smokeless tobacco: Never Used  Substance and Sexual Activity  . Alcohol use: Yes    Comment: weekly  . Drug use: No  . Sexual activity: Yes  Lifestyle  . Physical activity:    Days per week: Not on file    Minutes per session: Not on file  . Stress: Not on file  Relationships  . Social connections:    Talks on phone: Not  on file    Gets together: Not on file    Attends religious service: Not on file    Active member of club or organization: Not on file    Attends meetings of clubs or organizations: Not on file    Relationship status: Not on file  . Intimate partner violence:    Fear of current or ex partner: Not on file    Emotionally abused: Not on file    Physically abused: Not on file    Forced sexual activity: Not on file  Other Topics Concern  . Not on file  Social History Narrative  . Not on file    FAMILY HISTORY: Family History  Problem Relation Age of Onset  . Cancer Mother   . Heart attack Father      ALLERGIES:  is allergic to bee venom; penicillins; and sulfa antibiotics.  MEDICATIONS:  Current Outpatient Medications  Medication Sig Dispense Refill  . amLODipine (NORVASC) 10 MG tablet Take 10 mg by mouth daily.    Marland Kitchen doxepin (SINEQUAN) 10 MG capsule Take 10 mg by mouth.    . hydrOXYzine (ATARAX/VISTARIL) 50 MG tablet Take 50 mg by mouth 3 (three) times daily.    Marland Kitchen lisinopril (ZESTRIL) 10 MG tablet Take 10 mg by mouth daily.    . ondansetron (ZOFRAN ODT) 4 MG disintegrating tablet Take 1 tablet (4 mg total) by mouth every 8 (eight) hours as needed for nausea or vomiting. 20 tablet 0  . Rivaroxaban 15 & 20 MG TBPK Take as directed on package: Start with one 15mg  tablet by mouth twice a day with food. On Day 22, switch to one 20mg  tablet once a day with food. 51 each 0   No current facility-administered medications for this visit.     REVIEW OF SYSTEMS:   Constitutional: Denies fevers, chills or abnormal night sweats Eyes: Denies blurriness of vision, double vision or watery eyes Ears, nose, mouth, throat, and face: Denies mucositis or sore throat Respiratory: Denies cough, dyspnea or wheezes Cardiovascular: Denies palpitation, chest discomfort or lower extremity swelling Gastrointestinal:  Denies nausea, heartburn or change in bowel habits GU: (+) priapism  Skin: Denies abnormal skin rashes Lymphatics: Denies new lymphadenopathy or easy bruising Neurological:Denies numbness, tingling or new weaknesses Behavioral/Psych: (+) Bipolar All other systems were reviewed with the patient and are negative.  PHYSICAL EXAMINATION: ECOG PERFORMANCE STATUS: 1 - Symptomatic but completely ambulatory  Vitals:   02/08/19 1340  BP: (!) 131/93  Pulse: (!) 55  Temp: 98.6 F (37 C)   Filed Weights   02/08/19 1340  Weight: 242 lb (109.8 kg)    GENERAL:alert, no distress and comfortable SKIN: skin color, texture, turgor are normal, no rashes or significant lesions EYES: normal,  conjunctiva are pink and non-injected, sclera clear OROPHARYNX:no exudate, no erythema and lips, buccal mucosa, and tongue normal  NECK: supple, thyroid normal size, non-tender, without nodularity LYMPH:  no palpable lymphadenopathy in the cervical, axillary or inguinal LUNGS: clear to auscultation and percussion with normal breathing effort HEART: regular rate & rhythm and no murmurs and no lower extremity edema ABDOMEN:abdomen soft, non-tender and normal bowel sounds Musculoskeletal:no cyanosis of digits and no clubbing  PSYCH: alert & oriented x 3 with fluent speech NEURO: no focal motor/sensory deficits  LABORATORY DATA:  I have reviewed the data as listed Recent Results (from the past 2160 hour(s))  Urinalysis, Routine w reflex microscopic     Status: Abnormal   Collection Time: 12/15/18  6:10 PM  Result Value Ref Range   Color, Urine YELLOW YELLOW   APPearance CLEAR CLEAR   Specific Gravity, Urine 1.017 1.005 - 1.030   pH 7.0 5.0 - 8.0   Glucose, UA NEGATIVE NEGATIVE mg/dL   Hgb urine dipstick NEGATIVE NEGATIVE   Bilirubin Urine NEGATIVE NEGATIVE   Ketones, ur NEGATIVE NEGATIVE mg/dL   Protein, ur NEGATIVE NEGATIVE mg/dL   Nitrite NEGATIVE NEGATIVE   Leukocytes,Ua SMALL (A) NEGATIVE   RBC / HPF 0-5 0 - 5 RBC/hpf   WBC, UA 6-10 0 - 5 WBC/hpf   Bacteria, UA RARE (A) NONE SEEN   Squamous Epithelial / LPF 0-5 0 - 5   Mucus PRESENT     Comment: Performed at [facility], [address] [place], [place] [address]  Urine culture     Status: Abnormal   Collection Time: 12/15/18  6:10 PM  Result Value Ref Range   Specimen Description      URINE, CLEAN CATCH Performed at [facility], [address], [place], [place] [address]    Special Requests      Normal Performed at [facility], [address], [place], [place] [address]    Culture (A)     <10,000 COLONIES/mL INSIGNIFICANT GROWTH Performed at [facility] [address] [place],  [address]    Report  Status 12/17/2018 FINAL     RADIOGRAPHIC STUDIES: I have personally reviewed the radiological images as listed and agreed with the findings in the report.   ASSESSMENT & PLAN:  Discussed differential causes of priapism. There are some case reports that have linked blood thinners to priapism. We discussed changing anticoagulation therapy from Xarelto to Eliquis, pending blood work. We will also complete work up for referral of hyperviscosity syndrome.   1. Hx of recurrent unprovoked DVT and PE: -We will check lupus anticoagulant today. - Both episodes appear to be unprovoked.  Recommend indefinite anticoagulation.  2. Hyperviscosity - Labs: Serum viscosity, SPEP, SIFE,   3. Painful Priapism: - Refer to Urology - Prescribed Tramadol 50 mg x 7 days -There have been case reports of olanzapine and anticoagulants causing priapism.  Olanzapine was apparently discontinued recently.  Consider changing  anticoagulation therapy to Eliquis 5 mg BID.   4. Hx of smoking/weight loss - CT chest  5.. RTC in 2 weeks.      All questions were answered. The patient knows to call the clinic with any problems, questions or concerns. I spent 60 minutes counseling the patient face to face. The total time spent in the appointment was 60 minutes and more than 50% was on counseling.     Derek Jack, MD 02/08/19 4:21 PM

## 2019-02-08 NOTE — Patient Instructions (Addendum)
[place] at [facility] Discharge Instructions  You were seen today by Dr. Delton Coombes. He went over your recent scan results. He will get labs done today. He wants you to have a CT of your chest. He will see you back in 2 weeks for follow up.   Thank you for choosing [place] at [facility] to provide your oncology and hematology care.  To afford each patient quality time with our provider, please arrive at least 15 minutes before your scheduled appointment time.   If you have a lab appointment with the [facility] please come in thru the  Main Entrance and check in at the main information desk  You need to re-schedule your appointment should you arrive 10 or more minutes late.  We strive to give you quality time with our providers, and arriving late affects you and other patients whose appointments are after yours.  Also, if you no show three or more times for appointments you may be dismissed from the clinic at the providers discretion.     Again, thank you for choosing [facility].  Our hope is that these requests will decrease the amount of time that you wait before being seen by our physicians.       _____________________________________________________________  Should you have questions after your visit to [facility], please contact our office at [phone] between the hours of 8:00 a.m. and 4:30 p.m.  Voicemails left after 4:00 p.m. will not be returned until the following business day.  For prescription refill requests, have your pharmacy contact our office and allow 72 hours.    Cancer Center Support Programs:   > Cancer Support Group  2nd Tuesday of the month 1pm-2pm, Journey Room

## 2019-02-09 LAB — LUPUS ANTICOAGULANT PANEL
DRVVT: 35 s (ref 0.0–47.0)
PTT Lupus Anticoagulant: 34.2 s (ref 0.0–51.9)

## 2019-02-09 LAB — IMMUNOFIXATION ELECTROPHORESIS
IgA: 292 mg/dL (ref 90–386)
IgG (Immunoglobin G), Serum: 1220 mg/dL (ref 603–1613)
IgM (Immunoglobulin M), Srm: 89 mg/dL (ref 20–172)
Total Protein ELP: 6.5 g/dL (ref 6.0–8.5)

## 2019-02-09 LAB — PROTEIN ELECTROPHORESIS, SERUM
A/G Ratio: 1.3 (ref 0.7–1.7)
Albumin ELP: 3.7 g/dL (ref 2.9–4.4)
Alpha-1-Globulin: 0.2 g/dL (ref 0.0–0.4)
Alpha-2-Globulin: 0.6 g/dL (ref 0.4–1.0)
Beta Globulin: 1 g/dL (ref 0.7–1.3)
Gamma Globulin: 1.1 g/dL (ref 0.4–1.8)
Globulin, Total: 2.9 g/dL (ref 2.2–3.9)
Total Protein ELP: 6.6 g/dL (ref 6.0–8.5)

## 2019-02-10 LAB — VISCOSITY, SERUM: Viscosity, Serum: 1.6 rel.saline (ref 1.6–1.9)

## 2019-02-12 ENCOUNTER — Other Ambulatory Visit (HOSPITAL_COMMUNITY): Payer: Self-pay

## 2019-02-12 DIAGNOSIS — D759 Disease of blood and blood-forming organs, unspecified: Secondary | ICD-10-CM

## 2019-02-15 ENCOUNTER — Ambulatory Visit (HOSPITAL_COMMUNITY)
Admission: RE | Admit: 2019-02-15 | Discharge: 2019-02-15 | Disposition: A | Discharge status: Home / Self Care | Payer: Medicaid Other | Source: Ambulatory Visit | Attending: Hematology | Admitting: Hematology

## 2019-02-15 ENCOUNTER — Inpatient Hospital Stay (HOSPITAL_COMMUNITY): Payer: Medicaid Other

## 2019-02-15 ENCOUNTER — Other Ambulatory Visit: Payer: Self-pay

## 2019-02-15 DIAGNOSIS — D759 Disease of blood and blood-forming organs, unspecified: Secondary | ICD-10-CM

## 2019-02-15 DIAGNOSIS — R634 Abnormal weight loss: Secondary | ICD-10-CM | POA: Insufficient documentation

## 2019-02-15 DIAGNOSIS — Z87891 Personal history of nicotine dependence: Secondary | ICD-10-CM

## 2019-02-15 DIAGNOSIS — Z86718 Personal history of other venous thrombosis and embolism: Secondary | ICD-10-CM | POA: Diagnosis not present

## 2019-02-15 LAB — COMPREHENSIVE METABOLIC PANEL
ALT: 18 U/L (ref 0–44)
AST: 26 U/L (ref 15–41)
Albumin: 4 g/dL (ref 3.5–5.0)
Alkaline Phosphatase: 70 U/L (ref 38–126)
Anion gap: 8 (ref 5–15)
BUN: 12 mg/dL (ref 6–20)
CO2: 26 mmol/L (ref 22–32)
Calcium: 9.5 mg/dL (ref 8.9–10.3)
Chloride: 105 mmol/L (ref 98–111)
Creatinine, Ser: 1.2 mg/dL (ref 0.61–1.24)
GFR calc Af Amer: >60 mL/min (ref >60)
GFR calc non Af Amer: >60 mL/min (ref >60)
Glucose, Bld: 101 mg/dL — ABNORMAL HIGH (ref 70–99)
Potassium: 4.6 mmol/L (ref 3.5–5.1)
Sodium: 139 mmol/L (ref 135–145)
Total Bilirubin: 0.9 mg/dL (ref 0.3–1.2)
Total Protein: 7.3 g/dL (ref 6.5–8.1)

## 2019-02-15 LAB — CBC WITH DIFFERENTIAL/PLATELET
Abs Immature Granulocytes: 0.02 10*3/uL (ref 0.00–0.07)
Basophils Absolute: 0.1 10*3/uL (ref 0.0–0.1)
Basophils Relative: 1 %
Eosinophils Absolute: 0.3 10*3/uL (ref 0.0–0.5)
Eosinophils Relative: 2 %
HCT: 46.2 % (ref 39.0–52.0)
Hemoglobin: 15.5 g/dL (ref 13.0–17.0)
Immature Granulocytes: 0 %
Lymphocytes Relative: 25 %
Lymphs Abs: 2.6 10*3/uL (ref 0.7–4.0)
MCH: 33.8 pg (ref 26.0–34.0)
MCHC: 33.5 g/dL (ref 30.0–36.0)
MCV: 100.9 fL — ABNORMAL HIGH (ref 80.0–100.0)
Monocytes Absolute: 0.9 10*3/uL (ref 0.1–1.0)
Monocytes Relative: 8 %
Neutro Abs: 6.5 10*3/uL (ref 1.7–7.7)
Neutrophils Relative %: 64 %
Platelets: 310 10*3/uL (ref 150–400)
RBC: 4.58 MIL/uL (ref 4.22–5.81)
RDW: 12.3 % (ref 11.5–15.5)
WBC: 10.3 10*3/uL (ref 4.0–10.5)
nRBC: 0 % (ref 0.0–0.2)

## 2019-02-15 LAB — POCT I-STAT CREATININE: Creatinine, Ser: 1.2 mg/dL (ref 0.61–1.24)

## 2019-02-15 MED ORDER — IOHEXOL 300 MG/ML  SOLN
75.0000 mL | Freq: Once | INTRAMUSCULAR | Status: AC | PRN
  Administered 2019-02-15: 75 mL via INTRAVENOUS

## 2019-02-17 ENCOUNTER — Ambulatory Visit (HOSPITAL_COMMUNITY): Payer: Medicaid Other | Admitting: Nurse Practitioner

## 2019-02-18 ENCOUNTER — Inpatient Hospital Stay (HOSPITAL_BASED_OUTPATIENT_CLINIC_OR_DEPARTMENT_OTHER): Payer: Medicaid Other | Admitting: Hematology

## 2019-02-18 ENCOUNTER — Encounter (HOSPITAL_COMMUNITY): Payer: Self-pay | Admitting: Hematology

## 2019-02-18 ENCOUNTER — Other Ambulatory Visit: Payer: Self-pay

## 2019-02-18 VITALS — BP 131/84 | HR 61 | Temp 97.9°F | Resp 18 | Wt 234.5 lb

## 2019-02-18 DIAGNOSIS — R456 Violent behavior: Secondary | ICD-10-CM | POA: Diagnosis not present

## 2019-02-18 DIAGNOSIS — Z86718 Personal history of other venous thrombosis and embolism: Secondary | ICD-10-CM

## 2019-02-18 DIAGNOSIS — F1290 Cannabis use, unspecified, uncomplicated: Secondary | ICD-10-CM

## 2019-02-18 DIAGNOSIS — F319 Bipolar disorder, unspecified: Secondary | ICD-10-CM

## 2019-02-18 DIAGNOSIS — D759 Disease of blood and blood-forming organs, unspecified: Secondary | ICD-10-CM

## 2019-02-18 DIAGNOSIS — N4830 Priapism, unspecified: Secondary | ICD-10-CM

## 2019-02-18 DIAGNOSIS — I10 Essential (primary) hypertension: Secondary | ICD-10-CM

## 2019-02-18 NOTE — Progress Notes (Signed)
[place], [place] [address]   CLINIC:  Medical Oncology/Hematology  PCP:  Raiford Simmonds., PA-C [place] DEPT. [address] [address] [phone]   REASON FOR VISIT:  Follow-up for Hyperviscosity syndrome    BRIEF ONCOLOGIC HISTORY: Follow up of possible hyperviscosity syndrome.    INTERVAL HISTORY:   Pt presents for follow up regarding referral for hyperviscosity syndrome and history of recurrent thrombus. Pt verbalizes he is very upset that no one is helping him with his pain.He mentions he has seen several doctors and no one will help him. He is agitated and frustrated with staff.   REVIEW OF SYSTEMS:  Review of Systems  Gastrointestinal: Positive for nausea.  Neurological: Positive for headaches.  Psychiatric/Behavioral: Positive for sleep disturbance.     PAST MEDICAL/SURGICAL HISTORY:  Past Medical History:  Diagnosis Date  . Asthma   . Bipolar 1 disorder ([place])   . Chronic knee pain   . DVT (deep venous thrombosis) ([place])   . Hypertension   . Suicide attempt [facility])    2010 Intentional overdose attempt with Depakote.   Past Surgical History:  Procedure Laterality Date  . FOOT SURGERY     had peice of wire removed  . IVC FILTER REMOVAL N/A 02/24/2017   Procedure: IVC Filter Removal;  Surgeon: [name], MD;  Location: [place] [facility];  Service: Cardiovascular;  Laterality: N/A;  . PERIPHERAL VASCULAR CATHETERIZATION Right 10/22/2016   Procedure: Thrombectomy, thrombolyisis;  Surgeon: [name], MD;  Location: [place] [facility];  Service: Cardiovascular;  Laterality: Right;  . PERIPHERAL VASCULAR CATHETERIZATION N/A 10/22/2016   Procedure: IVC Filter Insertion;  Surgeon: [name], MD;  Location: [place] [facility];  Service: Cardiovascular;  Laterality: N/A;     SOCIAL HISTORY:  Social History   Socioeconomic History  . Marital status: Married    Spouse name: Not on  file  . Number of children: Not on file  . Years of education: Not on file  . Highest education level: Not on file  Occupational History  . Not on file  Social Needs  . Financial resource strain: Not on file  . Food insecurity:    Worry: Not on file    Inability: Not on file  . Transportation needs:    Medical: Not on file    Non-medical: Not on file  Tobacco Use  . Smoking status: Current Every Day Smoker    Packs/day: 0.50  . Smokeless tobacco: Never Used  Substance and Sexual Activity  . Alcohol use: Yes    Comment: weekly  . Drug use: No  . Sexual activity: Yes  Lifestyle  . Physical activity:    Days per week: Not on file    Minutes per session: Not on file  . Stress: Not on file  Relationships  . Social connections:    Talks on phone: Not on file    Gets together: Not on file    Attends religious service: Not on file    Active member of club or organization: Not on file    Attends meetings of clubs or organizations: Not on file    Relationship status: Not on file  . Intimate partner violence:    Fear of current or ex partner: Not on file    Emotionally abused: Not on file    Physically abused: Not on file    Forced sexual activity: Not on file  Other Topics Concern  . Not on file  Social History Narrative  . Not on file    FAMILY HISTORY:  Family History  Problem Relation Age of Onset  . Cancer Mother   . Heart attack Father     CURRENT MEDICATIONS:  Outpatient Encounter Medications as of 02/18/2019  Medication Sig Note  . amLODipine (NORVASC) 10 MG tablet Take 10 mg by mouth daily.   Marland Kitchen doxepin (SINEQUAN) 10 MG capsule Take 10 mg by mouth.   . hydrOXYzine (ATARAX/VISTARIL) 50 MG tablet Take 50 mg by mouth 3 (three) times daily.   Marland Kitchen lisinopril (ZESTRIL) 10 MG tablet Take 10 mg by mouth daily.   . ondansetron (ZOFRAN ODT) 4 MG disintegrating tablet Take 1 tablet (4 mg total) by mouth every 8 (eight) hours as needed for nausea or vomiting.   .  Rivaroxaban 15 & 20 MG TBPK Take as directed on package: Start with one 15mg  tablet by mouth twice a day with food. On Day 22, switch to one 20mg  tablet once a day with food. 02/08/2019: 20mg    No facility-administered encounter medications on file as of 02/18/2019.     ALLERGIES:  Allergies  Allergen Reactions  . Bee Venom Swelling  . Penicillins Other (See Comments)    PT states that he had symptoms that felt like he was having a heart attack Has patient had a PCN reaction causing immediate rash, facial/tongue/throat swelling, SOB or lightheadedness with hypotension: No Has patient had a PCN reaction causing severe rash involving mucus membranes or skin necrosis: No Has patient had a PCN reaction that required hospitalization No Has patient had a PCN reaction occurring within the last 10 years: No If all of the above answers are "NO", then may proceed with Cephal  . Sulfa Antibiotics Itching     PHYSICAL EXAM:  ECOG Performance status: 0  Vitals:   02/18/19 0825  BP: 131/84  Pulse: 61  Resp: 18  Temp: 97.9 F (36.6 C)  SpO2: 98%   Filed Weights   02/18/19 0825  Weight: 234 lb 8 oz (106.4 kg)    Physical Exam HENT:     Head: Normocephalic.     Nose: Nose normal.  Eyes:     Extraocular Movements: Extraocular movements intact.  Pulmonary:     Effort: Pulmonary effort is normal.  Neurological:     Mental Status: He is alert.  Psychiatric:        Behavior: Behavior is agitated, aggressive and hyperactive.        Judgment: Judgment is inappropriate.      LABORATORY DATA:  I have reviewed the labs as listed.  CBC    Component Value Date/Time   WBC 10.3 02/15/2019 1507   RBC 4.58 02/15/2019 1507   HGB 15.5 02/15/2019 1507   HCT 46.2 02/15/2019 1507   PLT 310 02/15/2019 1507   MCV 100.9 (H) 02/15/2019 1507   MCH 33.8 02/15/2019 1507   MCHC 33.5 02/15/2019 1507   RDW 12.3 02/15/2019 1507   LYMPHSABS 2.6 02/15/2019 1507   MONOABS 0.9 02/15/2019 1507   EOSABS  0.3 02/15/2019 1507   BASOSABS 0.1 02/15/2019 1507   CMP Latest Ref Rng & Units 02/15/2019 02/15/2019 10/16/2018  Glucose 70 - 99 mg/dL - 101(H) 92  BUN 6 - 20 mg/dL - 12 13  Creatinine 0.61 - 1.24 mg/dL 1.20 1.20 1.22  Sodium 135 - 145 mmol/L - 139 137  Potassium 3.5 - 5.1 mmol/L - 4.6 4.2  Chloride  98 - 111 mmol/L - 105 106  CO2 22 - 32 mmol/L - 26 23  Calcium 8.9 - 10.3 mg/dL - 9.5 8.9  Total Protein 6.5 - 8.1 g/dL - 7.3 8.0  Total Bilirubin 0.3 - 1.2 mg/dL - 0.9 0.4  Alkaline Phos 38 - 126 U/L - 70 68  AST 15 - 41 U/L - 26 24  ALT 0 - 44 U/L - 18 24       DIAGNOSTIC IMAGING:  I have independently reviewed the scans and discussed with the patient.   I have reviewed Venita Lick LPN's note and agree with the documentation.  I personally performed a face-to-face visit, made revisions and my assessment and plan is as follows.    ASSESSMENT & PLAN:  Discussed differential causes of priapism. There are some case reports that have linked blood thinners to priapism. We discussed changing anticoagulation therapy from Xarelto to Eliquis if problem continues. Lupus anticoagulant was negative. Work up for hyperviscosity syndrome was negative.    1. Hx of recurrent unprovoked DVT and PE: - Lupus anticoagulant was negative - Both episodes appear to be unprovoked.  Recommend indefinite anticoagulation.  2. Hyperviscosity - Labs: Serum viscosity: WNL, SPEP: negative, SIFE: negative - It is unlikely pt has hyperviscosity syndrome based on work up.     3. Painful Priapism: - Refer to Urology -There have been case reports of olanzapine and anticoagulants causing priapism.  Olanzapine was apparently discontinued recently.  Consider changing  anticoagulation therapy to Eliquis 5 mg BID if no urological cause is determined.   4. RTC: - Unfortunately, patient became very agitated and began cussing at staff because of our apprehension to provide him with pain medication.  He has admitted  that he smokes marijuana.  Hence we did not want to give him pain medication.  Patient left exam room angrily and slammed the door. For the safety of our clinic and staff, patient will be discharged from this practice.       Derek Jack, MD [place] [phone]

## 2019-02-18 NOTE — Patient Instructions (Addendum)
[place] at [facility] Discharge Instructions  You were seen today by Dr. Delton Coombes. He went over your recent lab results. He is going to refer you to urology. He will see you back in 3 months for labs and follow up.   Thank you for choosing [place] at [facility] to provide your oncology and hematology care.  To afford each patient quality time with our provider, please arrive at least 15 minutes before your scheduled appointment time.   If you have a lab appointment with the [facility] please come in thru the  Main Entrance and check in at the main information desk  You need to re-schedule your appointment should you arrive 10 or more minutes late.  We strive to give you quality time with our providers, and arriving late affects you and other patients whose appointments are after yours.  Also, if you no show three or more times for appointments you may be dismissed from the clinic at the providers discretion.     Again, thank you for choosing [facility].  Our hope is that these requests will decrease the amount of time that you wait before being seen by our physicians.       _____________________________________________________________  Should you have questions after your visit to [facility], please contact our office at [phone] between the hours of 8:00 a.m. and 4:30 p.m.  Voicemails left after 4:00 p.m. will not be returned until the following business day.  For prescription refill requests, have your pharmacy contact our office and allow 72 hours.    Cancer Center Support Programs:   > Cancer Support Group  2nd Tuesday of the month 1pm-2pm, Journey Room

## 2019-02-22 ENCOUNTER — Other Ambulatory Visit: Payer: Self-pay

## 2019-02-22 ENCOUNTER — Encounter: Payer: Self-pay | Admitting: Nurse Practitioner

## 2019-02-22 ENCOUNTER — Ambulatory Visit (INDEPENDENT_AMBULATORY_CARE_PROVIDER_SITE_OTHER): Payer: Medicaid Other | Admitting: Nurse Practitioner

## 2019-02-22 DIAGNOSIS — K625 Hemorrhage of anus and rectum: Secondary | ICD-10-CM | POA: Diagnosis not present

## 2019-02-22 DIAGNOSIS — R112 Nausea with vomiting, unspecified: Secondary | ICD-10-CM

## 2019-02-22 MED ORDER — ONDANSETRON HCL 4 MG PO TABS: 4.0000 mg | ORAL_TABLET | Freq: Three times a day (TID) | ORAL | 1 refills | Status: DC

## 2019-02-22 NOTE — Progress Notes (Addendum)
Referring Provider: Raiford Simmonds., PA-C Primary Care Physician:  Raiford Simmonds., PA-C Primary GI:  Dr. Gala Romney  NOTE: Service was provided via telemedicine and was requested by the patient due to COVID-19 pandemic.  Method of visit: Doxy.Me  Patient Location: A friend's house  Provider Location: Office  Reason for Phone Visit: Referral from PCP  The patient was consented to phone follow-up via telephone encounter including billing of the encounter (yes/no): Yes  Persons present on the phone encounter, with roles: Friend (un-named)  Total time (minutes) spent on medical discussion: 23 minutes  Chief Complaint  Patient presents with  . Rectal Bleeding    Bleeding with bm's    HPI:   Lance Cohen is a 48 y.o. male who presents for virtual visit regarding: heme+ stool without anemia.  Reviewed information provided including office visit dated 01/27/2019.  At that time complained of loose stools with intermittent blood in the water, on the stool, and on the tissue for 2 or 3 days.  History of hemorrhoids and unsure if there are internal or external.  No constipation.  Also admitted lower abdominal pain that "hurts all the time."  Denies pain with defecation otherwise multiple BMs.  Labs were ordered and completed for 06/10/2019 which found CMP essentially normal, CBC with normal hemoglobin at 16.1 and platelet count of 285, white blood cell count mildly elevated at 12.9.  Lipase normal at 15.  Fecal occult blood testing was positive.  No history of colonoscopy in our system.  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  At that time he noted he continued to complain of a lot of pain and noted chronic appearing DVT occlusive to near occlusive in the right femoral region.  Previously seen by vascular group in [place] who recommended no intervention to the chronic nature of the clot.  Recent thrombectomy 2 years prior and filter removed about  1-1/2 years ago.  Due to his DVT the recommended continued daily aspirin.  Can continue with anticoagulation if primary care would prefer this.  No role for intervention either surgical or interventional that would be of benefit.  Chronic right leg pain into his groin likely due to clot.  Today he states he's doing ok overall. Started having hematochezia about 3-4 months ago. Had some previous episodes a few years ago that resolved. Hematochezia, recently, lasted a week and resolved, then started right back up. At this point bleeding in the commode and on the tissue. Denies melena. Has lower abdominal pain which he thinks is from a blood clot in his groin. The pain started when he developed the clot, states the clot is getting worse. Has never had a colonoscopy before. Persistent nausea, has intermittent post-prandial vomiting. Denies fever, chills. Has had some subjective weight loss with decreased appetite due to N/V. Denies URI and flu-like symptoms. Denies chest pain, dyspnea, dizziness, lightheadedness, syncope, near syncope. Denies any other upper or lower GI symptoms.  He states he has asked the health department for pain medication. He cannot use NSAIDs. Tylenol doesn't help. Recommended he follow-up with them for possible pain management referral.   The patient is currently on Xarelto for blood clot. Xarelto is managed by [place] [facility].  Past Medical History:  Diagnosis Date  . Asthma   . Bipolar 1 disorder ([place])   . Chronic knee pain   . DVT (deep venous thrombosis) ([place])   . Hypertension   . Insomnia   .  Suicide attempt [facility])    2010 Intentional overdose attempt with Depakote.    Past Surgical History:  Procedure Laterality Date  . FOOT SURGERY     had peice of wire removed  . IVC FILTER REMOVAL N/A 02/24/2017   Procedure: IVC Filter Removal;  Surgeon: [name], MD;  Location: [place] [facility];  Service: Cardiovascular;  Laterality: N/A;  . PERIPHERAL  VASCULAR CATHETERIZATION Right 10/22/2016   Procedure: Thrombectomy, thrombolyisis;  Surgeon: [name], MD;  Location: [place] [facility];  Service: Cardiovascular;  Laterality: Right;  . PERIPHERAL VASCULAR CATHETERIZATION N/A 10/22/2016   Procedure: IVC Filter Insertion;  Surgeon: [name], MD;  Location: [place] [facility];  Service: Cardiovascular;  Laterality: N/A;    Current Outpatient Medications  Medication Sig Dispense Refill  . amLODipine (NORVASC) 10 MG tablet Take 10 mg by mouth daily.    Marland Kitchen doxepin (SINEQUAN) 10 MG capsule Take 10 mg by mouth.    Marland Kitchen lisinopril (ZESTRIL) 10 MG tablet Take 10 mg by mouth daily.    . ondansetron (ZOFRAN ODT) 4 MG disintegrating tablet Take 1 tablet (4 mg total) by mouth every 8 (eight) hours as needed for nausea or vomiting. 20 tablet 0  . Rivaroxaban 15 & 20 MG TBPK Take as directed on package: Start with one 15mg  tablet by mouth twice a day with food. On Day 22, switch to one 20mg  tablet once a day with food. 51 each 0  . ondansetron (ZOFRAN) 4 MG tablet Take 1 tablet (4 mg total) by mouth 3 (three) times daily before meals. 90 tablet 1   No current facility-administered medications for this visit.     Allergies as of 02/22/2019 - Review Complete 02/22/2019  Allergen Reaction Noted  . Bee venom Swelling 12/08/2013  . Penicillins Other (See Comments)   . Sulfa antibiotics Itching 11/13/2013    Family History  Problem Relation Age of Onset  . Cancer Mother   . Heart attack Father   . Colon cancer Neg Hx     Social History   Socioeconomic History  . Marital status: Married    Spouse name: Not on file  . Number of children: Not on file  . Years of education: Not on file  . Highest education level: Not on file  Occupational History  . Not on file  Social Needs  . Financial resource strain: Not on file  . Food insecurity:    Worry: Not on file    Inability: Not on file  . Transportation needs:    Medical: Not on  file    Non-medical: Not on file  Tobacco Use  . Smoking status: Current Every Day Smoker    Packs/day: 0.25    Types: Cigarettes  . Smokeless tobacco: Never Used  Substance and Sexual Activity  . Alcohol use: Yes    Comment: occasionally: on weekends (typically a pint on the weekend) as of 02/22/2019  . Drug use: No  . Sexual activity: Yes  Lifestyle  . Physical activity:    Days per week: Not on file    Minutes per session: Not on file  . Stress: Not on file  Relationships  . Social connections:    Talks on phone: Not on file    Gets together: Not on file    Attends religious service: Not on file    Active member of club or organization: Not on file    Attends meetings of clubs or organizations: Not  on file    Relationship status: Not on file  Other Topics Concern  . Not on file  Social History Narrative  . Not on file    Review of Systems: Complete ROS negative except as per HPI.  Physical Exam: Note: limited exam due to virtual visit General:   Alert and oriented. Pleasant and cooperative. Well-nourished and well-developed.  Head:  Normocephalic and atraumatic. Eyes:  Without icterus, sclera clear and conjunctiva pink.  Ears:  Normal auditory acuity. Skin:  Intact without facial significant lesions or rashes. Neurologic:  Alert and oriented x4;  grossly normal neurologically. Psych:  Alert and cooperative. Normal mood and affect. Heme/Lymph/Immune: No excessive bruising noted.

## 2019-02-22 NOTE — Patient Instructions (Signed)
Your health issues we discussed today were:   Rectal bleeding: 1. I feel your bleeding is likely due to your hemorrhoids 2. However, we will plan for colonoscopy to further evaluate the lining of your colon and ensure there is no other cause for your bleeding 3. Further recommendations will follow your colonoscopy procedure 4. Follow-up in 4 months 5. We will likely need to hold your blood thinner for 2 days prior to your procedure.  We will discuss this further when scheduling your procedure, after we speak to your primary care provider  Nausea and vomiting: 1. I have sent in a refill of Zofran nausea medicine. 2. You can start taking this 3 times a day, about 20 to 30 minutes before a meal 3. When your nausea is under better control it you can take it every 8 hours, if needed 4. Call us for any severe or worsening symptoms  Overall I recommend:  1. Continue your other medications 2. Call us if you have any questions or concerns 3. Return for follow-up in 4 months   Because of recent events of COVID-19 ("Coronavirus"), follow CDC recommendations:  1. Wash your hand frequently 2. Avoid touching your face 3. Stay away from people who are sick 4. If you have symptoms such as fever, cough, shortness of breath then call your healthcare provider for further guidance 5. If you are sick, STAY AT HOME unless otherwise directed by your healthcare provider. 6. Follow directions from state and national officials regarding staying safe   At [facility] we value your feedback. You may receive a survey about your visit today. Please share your experience as we strive to create trusting relationships with our patients to provide genuine, compassionate, quality care.  We appreciate your understanding and patience as we review any laboratory studies, imaging, and other diagnostic tests that are ordered as we care for you. Our office policy is 5 business days for review of these  results, and any emergent or urgent results are addressed in a timely manner for your best interest. If you do not hear from our office in 1 week, please contact us.   We also encourage the use of MyChart, which contains your medical information for your review as well. If you are not enrolled in this feature, an access code is on this after visit summary for your convenience. Thank you for allowing [place] to be involved in your care.  It was great to see you today!  I hope you have a great day!!

## 2019-02-22 NOTE — Assessment & Plan Note (Signed)
The patient describes frequent nausea and occasional vomiting when he tries to eat.  He has had unintentional weight loss related to poor intake.  Zofran possibly works for him, but he was not given enough to really be able to take it regularly in order to narrow.  At this point I will refill his Zofran 4 mg 3 times a day before meals to help with better intake.  Call with any worsening symptoms and follow-up in 4 months.

## 2019-02-22 NOTE — H&P (View-Only) (Signed)
Referring Provider: Raiford Simmonds., PA-C Primary Care Physician:  Raiford Simmonds., PA-C Primary GI:  Dr. Gala Romney  NOTE: Service was provided via telemedicine and was requested by the patient due to COVID-19 pandemic.  Method of visit: Doxy.Me  Patient Location: A friend's house  Provider Location: Office  Reason for Phone Visit: Referral from PCP  The patient was consented to phone follow-up via telephone encounter including billing of the encounter (yes/no): Yes  Persons present on the phone encounter, with roles: Friend (un-named)  Total time (minutes) spent on medical discussion: 23 minutes  Chief Complaint  Patient presents with  . Rectal Bleeding    Bleeding with bm's    HPI:   Lance Cohen is a 48 y.o. male who presents for virtual visit regarding: heme+ stool without anemia.  Reviewed information provided including office visit dated 01/27/2019.  At that time complained of loose stools with intermittent blood in the water, on the stool, and on the tissue for 2 or 3 days.  History of hemorrhoids and unsure if there are internal or external.  No constipation.  Also admitted lower abdominal pain that "hurts all the time."  Denies pain with defecation otherwise multiple BMs.  Labs were ordered and completed for 06/10/2019 which found CMP essentially normal, CBC with normal hemoglobin at 16.1 and platelet count of 285, white blood cell count mildly elevated at 12.9.  Lipase normal at 15.  Fecal occult blood testing was positive.  No history of colonoscopy in our system.  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  At that time he noted he continued to complain of a lot of pain and noted chronic appearing DVT occlusive to near occlusive in the right femoral region.  Previously seen by vascular group in [place] who recommended no intervention to the chronic nature of the clot.  Recent thrombectomy 2 years prior and filter removed about  1-1/2 years ago.  Due to his DVT the recommended continued daily aspirin.  Can continue with anticoagulation if primary care would prefer this.  No role for intervention either surgical or interventional that would be of benefit.  Chronic right leg pain into his groin likely due to clot.  Today he states he's doing ok overall. Started having hematochezia about 3-4 months ago. Had some previous episodes a few years ago that resolved. Hematochezia, recently, lasted a week and resolved, then started right back up. At this point bleeding in the commode and on the tissue. Denies melena. Has lower abdominal pain which he thinks is from a blood clot in his groin. The pain started when he developed the clot, states the clot is getting worse. Has never had a colonoscopy before. Persistent nausea, has intermittent post-prandial vomiting. Denies fever, chills. Has had some subjective weight loss with decreased appetite due to N/V. Denies URI and flu-like symptoms. Denies chest pain, dyspnea, dizziness, lightheadedness, syncope, near syncope. Denies any other upper or lower GI symptoms.  He states he has asked the health department for pain medication. He cannot use NSAIDs. Tylenol doesn't help. Recommended he follow-up with them for possible pain management referral.   The patient is currently on Xarelto for blood clot. Xarelto is managed by [place] [facility].  Past Medical History:  Diagnosis Date  . Asthma   . Bipolar 1 disorder ([place])   . Chronic knee pain   . DVT (deep venous thrombosis) ([place])   . Hypertension   . Insomnia   .  Suicide attempt [facility])    2010 Intentional overdose attempt with Depakote.    Past Surgical History:  Procedure Laterality Date  . FOOT SURGERY     had peice of wire removed  . IVC FILTER REMOVAL N/A 02/24/2017   Procedure: IVC Filter Removal;  Surgeon: [name], MD;  Location: [place] [facility];  Service: Cardiovascular;  Laterality: N/A;  . PERIPHERAL  VASCULAR CATHETERIZATION Right 10/22/2016   Procedure: Thrombectomy, thrombolyisis;  Surgeon: [name], MD;  Location: [place] [facility];  Service: Cardiovascular;  Laterality: Right;  . PERIPHERAL VASCULAR CATHETERIZATION N/A 10/22/2016   Procedure: IVC Filter Insertion;  Surgeon: [name], MD;  Location: [place] [facility];  Service: Cardiovascular;  Laterality: N/A;    Current Outpatient Medications  Medication Sig Dispense Refill  . amLODipine (NORVASC) 10 MG tablet Take 10 mg by mouth daily.    Marland Kitchen doxepin (SINEQUAN) 10 MG capsule Take 10 mg by mouth.    Marland Kitchen lisinopril (ZESTRIL) 10 MG tablet Take 10 mg by mouth daily.    . ondansetron (ZOFRAN ODT) 4 MG disintegrating tablet Take 1 tablet (4 mg total) by mouth every 8 (eight) hours as needed for nausea or vomiting. 20 tablet 0  . Rivaroxaban 15 & 20 MG TBPK Take as directed on package: Start with one 15mg  tablet by mouth twice a day with food. On Day 22, switch to one 20mg  tablet once a day with food. 51 each 0  . ondansetron (ZOFRAN) 4 MG tablet Take 1 tablet (4 mg total) by mouth 3 (three) times daily before meals. 90 tablet 1   No current facility-administered medications for this visit.     Allergies as of 02/22/2019 - Review Complete 02/22/2019  Allergen Reaction Noted  . Bee venom Swelling 12/08/2013  . Penicillins Other (See Comments)   . Sulfa antibiotics Itching 11/13/2013    Family History  Problem Relation Age of Onset  . Cancer Mother   . Heart attack Father   . Colon cancer Neg Hx     Social History   Socioeconomic History  . Marital status: Married    Spouse name: Not on file  . Number of children: Not on file  . Years of education: Not on file  . Highest education level: Not on file  Occupational History  . Not on file  Social Needs  . Financial resource strain: Not on file  . Food insecurity:    Worry: Not on file    Inability: Not on file  . Transportation needs:    Medical: Not on  file    Non-medical: Not on file  Tobacco Use  . Smoking status: Current Every Day Smoker    Packs/day: 0.25    Types: Cigarettes  . Smokeless tobacco: Never Used  Substance and Sexual Activity  . Alcohol use: Yes    Comment: occasionally: on weekends (typically a pint on the weekend) as of 02/22/2019  . Drug use: No  . Sexual activity: Yes  Lifestyle  . Physical activity:    Days per week: Not on file    Minutes per session: Not on file  . Stress: Not on file  Relationships  . Social connections:    Talks on phone: Not on file    Gets together: Not on file    Attends religious service: Not on file    Active member of club or organization: Not on file    Attends meetings of clubs or organizations: Not  on file    Relationship status: Not on file  Other Topics Concern  . Not on file  Social History Narrative  . Not on file    Review of Systems: Complete ROS negative except as per HPI.  Physical Exam: Note: limited exam due to virtual visit General:   Alert and oriented. Pleasant and cooperative. Well-nourished and well-developed.  Head:  Normocephalic and atraumatic. Eyes:  Without icterus, sclera clear and conjunctiva pink.  Ears:  Normal auditory acuity. Skin:  Intact without facial significant lesions or rashes. Neurologic:  Alert and oriented x4;  grossly normal neurologically. Psych:  Alert and cooperative. Normal mood and affect. Heme/Lymph/Immune: No excessive bruising noted.

## 2019-02-22 NOTE — Assessment & Plan Note (Addendum)
The patient describes rectal bleeding several years ago which was self-limiting and resolved.  About 6 months ago he began having more rectal bleeding.  At this point it occurs with most bowel movements.  He has known hemorrhoids, although does not have hemorrhoid symptoms.  He has recently been started on Xarelto due to DVT.  It appears he is undergone IVC filter placement and removal, peripheral vascular catheterization in 2018.  Recently saw vascular who noted chronic occlusive to near occlusive right femoral vein DVT with associated pain.  Recommended daily aspirin and/or continued anticoagulation based on primary care preference.  No role for intervention.  It seems he does continue on Xarelto.  We will proceed with colonoscopy to evaluate for etiology behind his rectal bleeding.  I feel it is likely benign anorectal source given known hemorrhoids, but cannot rule out more insidious pathology.  We will reach out to primary care ([place]) the okay to hold his Xarelto for 48 hours prior to his colonoscopy.  Proceed with colonoscopy on propofol/MAC with Dr. Gala Romney in the near future. The risks, benefits, and alternatives have been discussed in detail with the patient. They state understanding and desire to proceed.   The patient is currently on Xarelto.  No other anticoagulants, anxiolytics, chronic pain medications, or antidepressants.  Drinks about a pint of alcohol a week, typically on the weekend.  Has insomnia as well.  Given his histories we will plan for the procedure on propofol/MAC to promote adequate sedation.

## 2019-02-23 ENCOUNTER — Telehealth: Payer: Self-pay

## 2019-02-23 ENCOUNTER — Encounter: Payer: Self-pay | Admitting: Internal Medicine

## 2019-02-23 ENCOUNTER — Other Ambulatory Visit: Payer: Self-pay

## 2019-02-23 DIAGNOSIS — K625 Hemorrhage of anus and rectum: Secondary | ICD-10-CM

## 2019-02-23 MED ORDER — CLENPIQ 10-3.5-12 MG-GM -GM/160ML PO SOLN: 1.0000 | Freq: Once | ORAL | 0 refills | Status: AC

## 2019-02-23 NOTE — Telephone Encounter (Signed)
Called and informed pt of pre-op appt 03/12/19 at 9:00am. Appt letter mailed with procedure instructions.

## 2019-02-23 NOTE — Progress Notes (Signed)
cc'ed to pcp °

## 2019-02-23 NOTE — Telephone Encounter (Signed)
Called pt, TCS w/Propofol w/RMR scheduled for 03/18/19 at 2:30pm. Rx for prep sent to pharmacy. Orders entered.

## 2019-02-25 ENCOUNTER — Telehealth: Payer: Self-pay

## 2019-02-25 NOTE — Telephone Encounter (Signed)
Spoke to the [facility]. The Triage nurse is going to consult the physician about pt holding Xarelto 48 hrs  prior to TCS. [phone].

## 2019-03-01 NOTE — Telephone Encounter (Signed)
VM received from Freddie Breech, Nursing supervisor at [facility], It is ok for pt to hold anticoagulant med 48 hours prior to procedure. Pt advised to restart medication up to 24 hours after procedure.

## 2019-03-01 NOTE — Telephone Encounter (Signed)
LMOVM for pt to make aware

## 2019-03-01 NOTE — Telephone Encounter (Signed)
Spoke with patient and is aware of below and will need to hold xarelto starting 5/26. He voiced understanding

## 2019-03-12 ENCOUNTER — Other Ambulatory Visit: Payer: Self-pay

## 2019-03-12 ENCOUNTER — Encounter (HOSPITAL_COMMUNITY)
Admission: RE | Admit: 2019-03-12 | Discharge: 2019-03-12 | Disposition: A | Discharge status: Home / Self Care | Payer: Medicaid Other | Source: Ambulatory Visit | Attending: Internal Medicine | Admitting: Internal Medicine

## 2019-03-17 ENCOUNTER — Other Ambulatory Visit (HOSPITAL_COMMUNITY)
Admission: RE | Admit: 2019-03-17 | Discharge: 2019-03-17 | Disposition: A | Discharge status: Home / Self Care | Payer: Medicaid Other | Source: Ambulatory Visit | Attending: Internal Medicine | Admitting: Internal Medicine

## 2019-03-17 ENCOUNTER — Other Ambulatory Visit: Payer: Self-pay

## 2019-03-17 DIAGNOSIS — Z1159 Encounter for screening for other viral diseases: Secondary | ICD-10-CM | POA: Diagnosis not present

## 2019-03-17 LAB — SARS CORONAVIRUS 2 BY RT PCR (HOSPITAL ORDER, PERFORMED IN ~~LOC~~ HOSPITAL LAB): SARS Coronavirus 2: NEGATIVE

## 2019-03-18 ENCOUNTER — Ambulatory Visit (HOSPITAL_COMMUNITY): Payer: Medicaid Other | Admitting: Anesthesiology

## 2019-03-18 ENCOUNTER — Other Ambulatory Visit: Payer: Self-pay

## 2019-03-18 ENCOUNTER — Encounter (HOSPITAL_COMMUNITY)
Admission: RE | Disposition: A | Discharge status: Home / Self Care | Payer: Self-pay | Source: Home / Self Care | Attending: Internal Medicine

## 2019-03-18 ENCOUNTER — Encounter (HOSPITAL_COMMUNITY): Payer: Self-pay | Admitting: Anesthesiology

## 2019-03-18 ENCOUNTER — Ambulatory Visit (HOSPITAL_COMMUNITY)
Admission: RE | Admit: 2019-03-18 | Discharge: 2019-03-18 | Disposition: A | Discharge status: Home / Self Care | Payer: Medicaid Other | Attending: Internal Medicine | Admitting: Internal Medicine

## 2019-03-18 DIAGNOSIS — Z79899 Other long term (current) drug therapy: Secondary | ICD-10-CM | POA: Diagnosis not present

## 2019-03-18 DIAGNOSIS — F319 Bipolar disorder, unspecified: Secondary | ICD-10-CM | POA: Insufficient documentation

## 2019-03-18 DIAGNOSIS — F17210 Nicotine dependence, cigarettes, uncomplicated: Secondary | ICD-10-CM | POA: Insufficient documentation

## 2019-03-18 DIAGNOSIS — K921 Melena: Secondary | ICD-10-CM | POA: Insufficient documentation

## 2019-03-18 DIAGNOSIS — I82511 Chronic embolism and thrombosis of right femoral vein: Secondary | ICD-10-CM | POA: Insufficient documentation

## 2019-03-18 DIAGNOSIS — Z882 Allergy status to sulfonamides status: Secondary | ICD-10-CM | POA: Insufficient documentation

## 2019-03-18 DIAGNOSIS — J45909 Unspecified asthma, uncomplicated: Secondary | ICD-10-CM | POA: Insufficient documentation

## 2019-03-18 DIAGNOSIS — K5730 Diverticulosis of large intestine without perforation or abscess without bleeding: Secondary | ICD-10-CM | POA: Insufficient documentation

## 2019-03-18 DIAGNOSIS — D123 Benign neoplasm of transverse colon: Secondary | ICD-10-CM | POA: Diagnosis not present

## 2019-03-18 DIAGNOSIS — Z880 Allergy status to penicillin: Secondary | ICD-10-CM | POA: Insufficient documentation

## 2019-03-18 DIAGNOSIS — K640 First degree hemorrhoids: Secondary | ICD-10-CM | POA: Diagnosis not present

## 2019-03-18 DIAGNOSIS — I10 Essential (primary) hypertension: Secondary | ICD-10-CM | POA: Diagnosis not present

## 2019-03-18 DIAGNOSIS — Z7901 Long term (current) use of anticoagulants: Secondary | ICD-10-CM | POA: Diagnosis not present

## 2019-03-18 DIAGNOSIS — K635 Polyp of colon: Secondary | ICD-10-CM

## 2019-03-18 DIAGNOSIS — K625 Hemorrhage of anus and rectum: Secondary | ICD-10-CM

## 2019-03-18 HISTORY — PX: POLYPECTOMY: SHX5525

## 2019-03-18 HISTORY — PX: COLONOSCOPY WITH PROPOFOL: SHX5780

## 2019-03-18 SURGERY — COLONOSCOPY WITH PROPOFOL
Anesthesia: General

## 2019-03-18 MED ORDER — LIDOCAINE 2% (20 MG/ML) 5 ML SYRINGE
INTRAMUSCULAR | Status: AC
  Filled 2019-03-18: qty 5

## 2019-03-18 MED ORDER — HYDROMORPHONE HCL 1 MG/ML IJ SOLN: 0.2500 mg | INTRAMUSCULAR | Status: DC | PRN

## 2019-03-18 MED ORDER — LACTATED RINGERS IV SOLN
INTRAVENOUS | Status: DC
  Administered 2019-03-18: 13:00:00 via INTRAVENOUS

## 2019-03-18 MED ORDER — MIDAZOLAM HCL 2 MG/2ML IJ SOLN: 0.5000 mg | Freq: Once | INTRAMUSCULAR | Status: DC | PRN

## 2019-03-18 MED ORDER — CHLORHEXIDINE GLUCONATE CLOTH 2 % EX PADS: 6.0000 | MEDICATED_PAD | Freq: Once | CUTANEOUS | Status: DC

## 2019-03-18 MED ORDER — PROPOFOL 10 MG/ML IV BOLUS
INTRAVENOUS | Status: AC
  Filled 2019-03-18: qty 40

## 2019-03-18 MED ORDER — STERILE WATER FOR IRRIGATION IR SOLN
Status: DC | PRN
  Administered 2019-03-18: 1.5 mL

## 2019-03-18 MED ORDER — KETAMINE HCL 10 MG/ML IJ SOLN
INTRAMUSCULAR | Status: DC | PRN
  Administered 2019-03-18 (×2): 10 mg via INTRAVENOUS

## 2019-03-18 MED ORDER — KETAMINE HCL 50 MG/5ML IJ SOSY
PREFILLED_SYRINGE | INTRAMUSCULAR | Status: AC
  Filled 2019-03-18: qty 5

## 2019-03-18 MED ORDER — HYDROCODONE-ACETAMINOPHEN 7.5-325 MG PO TABS: 1.0000 | ORAL_TABLET | Freq: Once | ORAL | Status: DC | PRN

## 2019-03-18 MED ORDER — PROPOFOL 500 MG/50ML IV EMUL
INTRAVENOUS | Status: DC | PRN
  Administered 2019-03-18: 100 ug/kg/min via INTRAVENOUS
  Administered 2019-03-18: 150 ug/kg/min via INTRAVENOUS

## 2019-03-18 MED ORDER — PROPOFOL 10 MG/ML IV BOLUS
INTRAVENOUS | Status: DC | PRN
  Administered 2019-03-18: 20 mg via INTRAVENOUS

## 2019-03-18 MED ORDER — PROMETHAZINE HCL 25 MG/ML IJ SOLN: 6.2500 mg | INTRAMUSCULAR | Status: DC | PRN

## 2019-03-18 NOTE — Op Note (Signed)
[facility] Patient Name: Lance Cohen Procedure Date: 03/18/2019 2:16 PM MRN: [id number] Date of Birth: July 19, 1971 Attending MD: Norvel Richards , MD CSN: [id number] Age: 48 Admit Type: Outpatient Procedure:                Colonoscopy Indications:              Hematochezia Providers:                Norvel Richards, MD, Jeanann Lewandowsky. Sharon Seller, RN,                            Charlsie Quest. Theda Sers RN, RN, Aram Candela Referring MD:             Royce Macadamia PA, PA Medicines:                Propofol per Anesthesia Complications:            No immediate complications. Estimated Blood Loss:     Estimated blood loss was minimal. Procedure:                Pre-Anesthesia Assessment:                           - Prior to the procedure, a History and Physical                            was performed, and patient medications and                            allergies were reviewed. The patient's tolerance of                            previous anesthesia was also reviewed. The risks                            and benefits of the procedure and the sedation                            options and risks were discussed with the patient.                            All questions were answered, and informed consent                            was obtained. Prior Anticoagulants: The patient                            last took Xarelto (rivaroxaban) 3 days prior to the                            procedure. ASA Grade Assessment: II - A patient                            with mild systemic disease. After reviewing the  risks and benefits, the patient was deemed in                            satisfactory condition to undergo the procedure.                           After obtaining informed consent, the colonoscope                            was passed under direct vision. Throughout the                            procedure, the patient's blood pressure, pulse, and                oxygen saturations were monitored continuously. The                            CF-HQ190L ([id number]) scope was introduced through                            the and advanced to the the cecum, identified by                            appendiceal orifice and ileocecal valve. The                            colonoscopy was performed without difficulty. The                            patient tolerated the procedure well. The quality                            of the bowel preparation was adequate. The                            ileocecal valve, appendiceal orifice, and rectum                            were photographed. The entire colon was well                            visualized. Scope In: 2:49:28 PM Scope Out: 3:03:46 PM Scope Withdrawal Time: 0 hours 9 minutes 58 seconds  Total Procedure Duration: 0 hours 14 minutes 18 seconds  Findings:      The perianal and digital rectal examinations were normal.      A 6 mm polyp was found in the splenic flexure. The polyp was       semi-pedunculated. The polyp was removed with a cold snare. Resection       and retrieval were complete. Estimated blood loss was minimal.      Scattered medium-mouthed diverticula were found in the sigmoid colon and       descending colon.      Non-bleeding internal hemorrhoids were found during retroflexion. The       hemorrhoids were moderate, medium-sized and Grade I (  internal       hemorrhoids that do not prolapse).      The exam was otherwise without abnormality on direct and retroflexion       views. Impression:               - One 6 mm polyp at the splenic flexure, removed                            with a cold snare. Resected and retrieved.                           - Diverticulosis in the sigmoid colon and in the                            descending colon.                           - Non-bleeding internal hemorrhoids.                           - The examination was otherwise normal on direct                             and retroflexion views. I suspect trivial bleeding                            from hemorrhoids Moderate Sedation:      Moderate (conscious) sedation was personally administered by an       anesthesia professional. The following parameters were monitored: oxygen       saturation, heart rate, blood pressure, respiratory rate, EKG, adequacy       of pulmonary ventilation, and response to care. Recommendation:           - Patient has a contact number available for                            emergencies. The signs and symptoms of potential                            delayed complications were discussed with the                            patient. Return to normal activities tomorrow.                            Written discharge instructions were provided to the                            patient.                           - Advance diet as tolerated.                           - Repeat colonoscopy date to be determined after  pending pathology results are reviewed for                            surveillance based on pathology results.                           - Return to GI office in 4 months. Begin Benefiber                            1 tablespoon daily for 3 weeks; then increase to 1                            tablespoon twice daily thereafter. I called                            patient's wife at [phone] and got a message                            stating "voicemail not yet set up" Procedure Code(s):        --- Professional ---                           [phone], Colonoscopy, flexible; with removal of                            tumor(s), polyp(s), or other lesion(s) by snare                            technique Diagnosis Code(s):        --- Professional ---                           K63.5, Polyp of colon                           K64.0, First degree hemorrhoids                           K92.1, Melena (includes Hematochezia)                            K57.30, Diverticulosis of large intestine without                            perforation or abscess without bleeding CPT copyright 2019 [employer]. All rights reserved. The codes documented in this report are preliminary and upon coder review may  be revised to meet current compliance requirements. Cristopher Estimable. Fausto Sampedro, MD Norvel Richards, MD 03/18/2019 3:12:37 PM This report has been signed electronically. Number of Addenda: 0

## 2019-03-18 NOTE — Anesthesia Procedure Notes (Signed)
Procedure Name: MAC Performed by: Adams, Amy A, CRNA Pre-anesthesia Checklist: Patient identified, Emergency Drugs available, Suction available, Timeout performed and Patient being monitored Patient Re-evaluated:Patient Re-evaluated prior to induction Oxygen Delivery Method: Nasal Cannula       

## 2019-03-18 NOTE — Transfer of Care (Signed)
Immediate Anesthesia Transfer of Care Note  Patient: Lance Cohen  Procedure(s) Performed: COLONOSCOPY WITH PROPOFOL (N/A ) POLYPECTOMY  Patient Location: PACU  Anesthesia Type:General  Level of Consciousness: awake, alert , oriented and patient cooperative  Airway & Oxygen Therapy: Patient Spontanous Breathing  Post-op Assessment: Report given to RN and Post -op Vital signs reviewed and stable  Post vital signs: Reviewed and stable  Last Vitals:  Vitals Value Taken Time  BP 94/69 03/18/2019  3:09 PM  Temp    Pulse 90 03/18/2019  3:11 PM  Resp 16 03/18/2019  3:11 PM  SpO2 97 % 03/18/2019  3:11 PM  Vitals shown include unvalidated device data.  Last Pain:  Vitals:   03/18/19 1445  TempSrc:   PainSc: 0-No pain      Patients Stated Pain Goal: 10 (19/59/74 7185)  Complications: No apparent anesthesia complications

## 2019-03-18 NOTE — Anesthesia Preprocedure Evaluation (Signed)
Anesthesia Evaluation  Patient identified by MRN, date of birth, ID band Patient awake    Reviewed: Allergy & Precautions, NPO status , Patient's Chart, lab work & pertinent test results  Airway Mallampati: II       Dental  (+) Missing, Poor Dentition   Pulmonary asthma , Current Smoker,           Cardiovascular Exercise Tolerance: Good hypertension, Pt. on medications negative cardio ROS  I  States ET limited by R groin blood clot on thinners   Neuro/Psych Bipolar Disorder Poor historian  H/o OD in 2010 Neuromuscular disease negative psych ROS   GI/Hepatic negative GI ROS, Neg liver ROS,   Endo/Other  negative endocrine ROS  Renal/GU negative Renal ROS  negative genitourinary   Musculoskeletal negative musculoskeletal ROS (+)   Abdominal   Peds negative pediatric ROS (+)  Hematology negative hematology ROS (+)   Anesthesia Other Findings MJ use when he can find it  Denies other illicit drug use   Reproductive/Obstetrics negative OB ROS                             Anesthesia Physical Anesthesia Plan  ASA: III  Anesthesia Plan: General   Post-op Pain Management:    Induction: Intravenous  PONV Risk Score and Plan:   Airway Management Planned: Nasal Cannula and Simple Face Mask  Additional Equipment:   Intra-op Plan:   Post-operative Plan:   Informed Consent: I have reviewed the patients History and Physical, chart, labs and discussed the procedure including the risks, benefits and alternatives for the proposed anesthesia with the patient or authorized representative who has indicated his/her understanding and acceptance.     Dental advisory given  Plan Discussed with: CRNA  Anesthesia Plan Comments: (Plan full PPE use  Plan GA with GETA back up as needed -WTP with same )        Anesthesia Quick Evaluation

## 2019-03-18 NOTE — Anesthesia Postprocedure Evaluation (Signed)
Anesthesia Post Note  Patient: Lance Cohen  Procedure(s) Performed: COLONOSCOPY WITH PROPOFOL (N/A ) POLYPECTOMY  Patient location during evaluation: PACU Anesthesia Type: General Level of consciousness: awake and alert and oriented Pain management: pain level controlled Vital Signs Assessment: post-procedure vital signs reviewed and stable Respiratory status: spontaneous breathing Cardiovascular status: stable Postop Assessment: no apparent nausea or vomiting Anesthetic complications: no     Last Vitals:  Vitals:   03/18/19 1231 03/18/19 1500  BP:  (P) 94/69  Pulse: 70 (P) 86  Resp:  (P) 18  Temp: 37.1 C (!) (P) 36.3 C  SpO2:  (P) 97%    Last Pain:  Vitals:   03/18/19 1445  TempSrc:   PainSc: 0-No pain                 Khalif Stender A

## 2019-03-18 NOTE — Discharge Instructions (Signed)
Colonoscopy Discharge Instructions  Read the instructions outlined below and refer to this sheet in the next few weeks. These discharge instructions provide you with general information on caring for yourself after you leave the hospital. Your doctor may also give you specific instructions. While your treatment has been planned according to the most current medical practices available, unavoidable complications occasionally occur. If you have any problems or questions after discharge, call Dr. Gala Romney at [phone]. ACTIVITY  You may resume your regular activity, but move at a slower pace for the next 24 hours.   Take frequent rest periods for the next 24 hours.   Walking will help get rid of the air and reduce the bloated feeling in your belly (abdomen).   No driving for 24 hours (because of the medicine (anesthesia) used during the test).    Do not sign any important legal documents or operate any machinery for 24 hours (because of the anesthesia used during the test).  NUTRITION  Drink plenty of fluids.   You may resume your normal diet as instructed by your doctor.   Begin with a light meal and progress to your normal diet. Heavy or fried foods are harder to digest and may make you feel sick to your stomach (nauseated).   Avoid alcoholic beverages for 24 hours or as instructed.  MEDICATIONS  You may resume your normal medications unless your doctor tells you otherwise.  WHAT YOU CAN EXPECT TODAY  Some feelings of bloating in the abdomen.   Passage of more gas than usual.   Spotting of blood in your stool or on the toilet paper.  IF YOU HAD POLYPS REMOVED DURING THE COLONOSCOPY:  No aspirin products for 7 days or as instructed.   No alcohol for 7 days or as instructed.   Eat a soft diet for the next 24 hours.  FINDING OUT THE RESULTS OF YOUR TEST Not all test results are available during your visit. If your test results are not back during the visit, make an appointment  with your caregiver to find out the results. Do not assume everything is normal if you have not heard from your caregiver or the medical facility. It is important for you to follow up on all of your test results.  SEEK IMMEDIATE MEDICAL ATTENTION IF:  You have more than a spotting of blood in your stool.   Your belly is swollen (abdominal distention).   You are nauseated or vomiting.   You have a temperature over 101.   You have abdominal pain or discomfort that is severe or gets worse throughout the day.   Hemorrhoid, diverticulosis and colon polyp information provided  Given Benefiber 1 tablespoon daily for 3 weeks then increase to 1 tablespoon twice daily thereafter  Further recommendations to follow pending review of pathology report  Resume Xarelto today  Diverticulosis  Diverticulosis is a condition that develops when small pouches (diverticula) form in the wall of the large intestine (colon). The colon is where water is absorbed and stool is formed. The pouches form when the inside layer of the colon pushes through weak spots in the outer layers of the colon. You may have a few pouches or many of them. What are the causes? The cause of this condition is not known. What increases the risk? The following factors may make you more likely to develop this condition:  Being older than age 47. Your risk for this condition increases with age. Diverticulosis is rare among people younger than  age 47. By age 47, many people have it.  Eating a low-fiber diet.  Having frequent constipation.  Being overweight.  Not getting enough exercise.  Smoking.  Taking over-the-counter pain medicines, like aspirin and ibuprofen.  Having a family history of diverticulosis. What are the signs or symptoms? In most people, there are no symptoms of this condition. If you do have symptoms, they may include:  Bloating.  Cramps in the abdomen.  Constipation or diarrhea.  Pain in the lower  left side of the abdomen. How is this diagnosed? This condition is most often diagnosed during an exam for other colon problems. Because diverticulosis usually has no symptoms, it often cannot be diagnosed independently. This condition may be diagnosed by:  Using a flexible scope to examine the colon (colonoscopy).  Taking an X-ray of the colon after dye has been put into the colon (barium enema).  Doing a CT scan. How is this treated? You may not need treatment for this condition if you have never developed an infection related to diverticulosis. If you have had an infection before, treatment may include:  Eating a high-fiber diet. This may include eating more fruits, vegetables, and grains.  Taking a fiber supplement.  Taking a live bacteria supplement (probiotic).  Taking medicine to relax your colon.  Taking antibiotic medicines. Follow these instructions at home:  Drink 6-8 glasses of water or more each day to prevent constipation.  Try not to strain when you have a bowel movement.  If you have had an infection before: ? Eat more fiber as directed by your health care provider or your diet and nutrition specialist (dietitian). ? Take a fiber supplement or probiotic, if your health care provider approves.  Take over-the-counter and prescription medicines only as told by your health care provider.  If you were prescribed an antibiotic, take it as told by your health care provider. Do not stop taking the antibiotic even if you start to feel better.  Keep all follow-up visits as told by your health care provider. This is important. Contact a health care provider if:  You have pain in your abdomen.  You have bloating.  You have cramps.  You have not had a bowel movement in 3 days. Get help right away if:  Your pain gets worse.  Your bloating becomes very bad.  You have a fever or chills, and your symptoms suddenly get worse.  You vomit.  You have bowel movements  that are bloody or black.  You have bleeding from your rectum. Summary  Diverticulosis is a condition that develops when small pouches (diverticula) form in the wall of the large intestine (colon).  You may have a few pouches or many of them.  This condition is most often diagnosed during an exam for other colon problems.  If you have had an infection related to diverticulosis, treatment may include increasing the fiber in your diet, taking supplements, or taking medicines. This information is not intended to replace advice given to you by your health care provider. Make sure you discuss any questions you have with your health care provider. Document Released: 07/04/2004 Document Revised: 08/26/2016 Document Reviewed: 08/26/2016 Elsevier Interactive Patient Education  2019 [employer]   High-Fiber Diet Fiber, also called dietary fiber, is a type of carbohydrate that is found in fruits, vegetables, whole grains, and beans. A high-fiber diet can have many health benefits. Your health care provider may recommend a high-fiber diet to help:  Prevent constipation. Fiber can make your  bowel movements more regular.  Lower your cholesterol.  Relieve the following conditions: ? Swelling of veins in the anus (hemorrhoids). ? Swelling and irritation (inflammation) of specific areas of the digestive tract (uncomplicated diverticulosis). ? A problem of the large intestine (colon) that sometimes causes pain and diarrhea (irritable bowel syndrome, IBS).  Prevent overeating as part of a weight-loss plan.  Prevent heart disease, type 2 diabetes, and certain cancers. What is my plan? The recommended daily fiber intake in grams (g) includes:  38 g for men age 62 or younger.  30 g for men over age 58.  65 g for women age 67 or younger.  21 g for women over age 67. You can get the recommended daily intake of dietary fiber by:  Eating a variety of fruits, vegetables, grains, and  beans.  Taking a fiber supplement, if it is not possible to get enough fiber through your diet. What do I need to know about a high-fiber diet?  It is better to get fiber through food sources rather than from fiber supplements. There is not a lot of research about how effective supplements are.  Always check the fiber content on the nutrition facts label of any prepackaged food. Look for foods that contain 5 g of fiber or more per serving.  Talk with a diet and nutrition specialist (dietitian) if you have questions about specific foods that are recommended or not recommended for your medical condition, especially if those foods are not listed below.  Gradually increase how much fiber you consume. If you increase your intake of dietary fiber too quickly, you may have bloating, cramping, or gas.  Drink plenty of water. Water helps you to digest fiber. What are tips for following this plan?  Eat a wide variety of high-fiber foods.  Make sure that half of the grains that you eat each day are whole grains.  Eat breads and cereals that are made with whole-grain flour instead of refined flour or white flour.  Eat brown rice, bulgur wheat, or millet instead of white rice.  Start the day with a breakfast that is high in fiber, such as a cereal that contains 5 g of fiber or more per serving.  Use beans in place of meat in soups, salads, and pasta dishes.  Eat high-fiber snacks, such as berries, raw vegetables, nuts, and popcorn.  Choose whole fruits and vegetables instead of processed forms like juice or sauce. What foods can I eat?  Fruits Berries. Pears. Apples. Oranges. Avocado. Prunes and raisins. Dried figs. Vegetables Sweet potatoes. Spinach. Kale. Artichokes. Cabbage. Broccoli. Cauliflower. Green peas. Carrots. Squash. Grains Whole-grain breads. Multigrain cereal. Oats and oatmeal. Brown rice. Barley. Bulgur wheat. [place]. Quinoa. Bran muffins. Popcorn. Rye wafer crackers. Meats  and other proteins Navy, kidney, and pinto beans. Soybeans. Split peas. Lentils. Nuts and seeds. Dairy Fiber-fortified yogurt. Beverages Fiber-fortified soy milk. Fiber-fortified orange juice. Other foods Fiber bars. The items listed above may not be a complete list of recommended foods and beverages. Contact a dietitian for more options. What foods are not recommended? Fruits Fruit juice. Cooked, strained fruit. Vegetables Fried potatoes. Canned vegetables. Well-cooked vegetables. Grains White bread. Pasta made with refined flour. White rice. Meats and other proteins Fatty cuts of meat. Fried chicken or fried fish. Dairy Milk. Yogurt. Cream cheese. Sour cream. Fats and oils Butters. Beverages Soft drinks. Other foods Cakes and pastries. The items listed above may not be a complete list of foods and beverages to avoid. Contact a  dietitian for more information. Summary  Fiber is a type of carbohydrate. It is found in fruits, vegetables, whole grains, and beans.  There are many health benefits of eating a high-fiber diet, such as preventing constipation, lowering blood cholesterol, helping with weight loss, and reducing your risk of heart disease, diabetes, and certain cancers.  Gradually increase your intake of fiber. Increasing too fast can result in cramping, bloating, and gas. Drink plenty of water while you increase your fiber.  The best sources of fiber include whole fruits and vegetables, whole grains, nuts, seeds, and beans. This information is not intended to replace advice given to you by your health care provider. Make sure you discuss any questions you have with your health care provider. Document Released: 10/07/2005 Document Revised: 08/11/2017 Document Reviewed: 08/11/2017   Hemorrhoids Hemorrhoids are swollen veins that may develop:  In the butt (rectum). These are called internal hemorrhoids.  Around the opening of the butt (anus). These are called external  hemorrhoids. Hemorrhoids can cause pain, itching, or bleeding. Most of the time, they do not cause serious problems. They usually get better with diet changes, lifestyle changes, and other home treatments. What are the causes? This condition may be caused by:  Having trouble pooping (constipation).  Pushing hard (straining) to poop.  Watery poop (diarrhea).  Pregnancy.  Being very overweight (obese).  Sitting for long periods of time.  Heavy lifting or other activity that causes you to strain.  Anal sex.  Riding a bike for a long period of time. What are the signs or symptoms? Symptoms of this condition include:  Pain.  Itching or soreness in the butt.  Bleeding from the butt.  Leaking poop.  Swelling in the area.  One or more lumps around the opening of your butt. How is this diagnosed? A doctor can often diagnose this condition by looking at the affected area. The doctor may also:  Do an exam that involves feeling the area with a gloved hand (digital rectal exam).  Examine the area inside your butt using a small tube (anoscope).  Order blood tests. This may be done if you have lost a lot of blood.  Have you get a test that involves looking inside the colon using a flexible tube with a camera on the end (sigmoidoscopy or colonoscopy). How is this treated? This condition can usually be treated at home. Your doctor may tell you to change what you eat, make lifestyle changes, or try home treatments. If these do not help, procedures can be done to remove the hemorrhoids or make them smaller. These may involve:  Placing rubber bands at the base of the hemorrhoids to cut off their blood supply.  Injecting medicine into the hemorrhoids to shrink them.  Shining a type of light energy onto the hemorrhoids to cause them to fall off.  Doing surgery to remove the hemorrhoids or cut off their blood supply. Follow these instructions at home: Eating and drinking   Eat  foods that have a lot of fiber in them. These include whole grains, beans, nuts, fruits, and vegetables.  Ask your doctor about taking products that have added fiber (fibersupplements).  Reduce the amount of fat in your diet. You can do this by: ? Eating low-fat dairy products. ? Eating less red meat. ? Avoiding processed foods.  Drink enough fluid to keep your pee (urine) pale yellow. Managing pain and swelling   Take a warm-water bath (sitz bath) for 20 minutes to ease pain. Do  this 3-4 times a day. You may do this in a bathtub or using a portable sitz bath that fits over the toilet.  If told, put ice on the painful area. It may be helpful to use ice between your warm baths. ? Put ice in a plastic bag. ? Place a towel between your skin and the bag. ? Leave the ice on for 20 minutes, 2-3 times a day. General instructions  Take over-the-counter and prescription medicines only as told by your doctor. ? Medicated creams and medicines may be used as told.  Exercise often. Ask your doctor how much and what kind of exercise is best for you.  Go to the bathroom when you have the urge to poop. Do not wait.  Avoid pushing too hard when you poop.  Keep your butt dry and clean. Use wet toilet paper or moist towelettes after pooping.  Do not sit on the toilet for a long time.  Keep all follow-up visits as told by your doctor. This is important. Contact a doctor if you:  Have pain and swelling that do not get better with treatment or medicine.  Have trouble pooping.  Cannot poop.  Have pain or swelling outside the area of the hemorrhoids. Get help right away if you have:  Bleeding that will not stop. Summary  Hemorrhoids are swollen veins in the butt or around the opening of the butt.  They can cause pain, itching, or bleeding.  Eat foods that have a lot of fiber in them. These include whole grains, beans, nuts, fruits, and vegetables.  Take a warm-water bath (sitz bath)  for 20 minutes to ease pain. Do this 3-4 times a day. This information is not intended to replace advice given to you by your health care provider. Make sure you discuss any questions you have with your health care provider. Document Released: 07/16/2008 Document Revised: 02/26/2018 Document Reviewed: 02/26/2018 Elsevier Interactive Patient Education  2019 [employer].  Chartered certified accountant Patient Education  [employer].

## 2019-03-18 NOTE — Interval H&P Note (Signed)
History and Physical Interval Note:  03/18/2019 2:37 PM  Lance Cohen  has presented today for surgery, with the diagnosis of rectal bleeding.  The various methods of treatment have been discussed with the patient and family. After consideration of risks, benefits and other options for treatment, the patient has consented to  Procedure(s) with comments: COLONOSCOPY WITH PROPOFOL (N/A) - 2:30pm as a surgical intervention.  The patient's history has been reviewed, patient examined, no change in status, stable for surgery.  I have reviewed the patient's chart and labs.  Questions were answered to the patient's satisfaction.     Lance Cohen  No change.  Last Eliquis 3 days ago.  Diagnostic colonoscopy per plan.  The risks, benefits, limitations, alternatives and imponderables have been reviewed with the patient. Questions have been answered. All parties are agreeable.

## 2019-03-22 ENCOUNTER — Encounter (HOSPITAL_COMMUNITY): Payer: Self-pay | Admitting: Internal Medicine

## 2019-03-23 ENCOUNTER — Encounter: Payer: Self-pay | Admitting: Internal Medicine

## 2019-03-29 ENCOUNTER — Emergency Department (HOSPITAL_COMMUNITY)
Admission: EM | Admit: 2019-03-29 | Discharge: 2019-03-29 | Disposition: A | Discharge status: Home / Self Care | Payer: Medicaid Other | Attending: Emergency Medicine | Admitting: Emergency Medicine

## 2019-03-29 ENCOUNTER — Encounter (HOSPITAL_COMMUNITY): Payer: Self-pay | Admitting: Emergency Medicine

## 2019-03-29 ENCOUNTER — Other Ambulatory Visit: Payer: Self-pay

## 2019-03-29 DIAGNOSIS — Z5321 Procedure and treatment not carried out due to patient leaving prior to being seen by health care provider: Secondary | ICD-10-CM | POA: Diagnosis not present

## 2019-03-29 DIAGNOSIS — M25561 Pain in right knee: Secondary | ICD-10-CM | POA: Insufficient documentation

## 2019-03-29 NOTE — ED Triage Notes (Signed)
Pt states he has multiple complaints, none of which are new. Has a BB in right knee, blood clot in right groin, and a knot on his right stomach which are all causing pain. Is anticoagulated for clot.

## 2019-04-22 ENCOUNTER — Emergency Department (HOSPITAL_COMMUNITY): Payer: Medicaid Other

## 2019-04-22 ENCOUNTER — Other Ambulatory Visit: Payer: Self-pay

## 2019-04-22 ENCOUNTER — Encounter (HOSPITAL_COMMUNITY): Payer: Self-pay | Admitting: Emergency Medicine

## 2019-04-22 ENCOUNTER — Emergency Department (HOSPITAL_COMMUNITY)
Admission: EM | Admit: 2019-04-22 | Discharge: 2019-04-22 | Disposition: A | Discharge status: Home / Self Care | Payer: Medicaid Other | Attending: Emergency Medicine | Admitting: Emergency Medicine

## 2019-04-22 DIAGNOSIS — Z79899 Other long term (current) drug therapy: Secondary | ICD-10-CM | POA: Diagnosis not present

## 2019-04-22 DIAGNOSIS — Z7901 Long term (current) use of anticoagulants: Secondary | ICD-10-CM | POA: Insufficient documentation

## 2019-04-22 DIAGNOSIS — F17210 Nicotine dependence, cigarettes, uncomplicated: Secondary | ICD-10-CM | POA: Insufficient documentation

## 2019-04-22 DIAGNOSIS — I10 Essential (primary) hypertension: Secondary | ICD-10-CM | POA: Diagnosis not present

## 2019-04-22 DIAGNOSIS — R42 Dizziness and giddiness: Secondary | ICD-10-CM | POA: Diagnosis not present

## 2019-04-22 DIAGNOSIS — J45909 Unspecified asthma, uncomplicated: Secondary | ICD-10-CM | POA: Diagnosis not present

## 2019-04-22 LAB — CBC WITH DIFFERENTIAL/PLATELET
Abs Immature Granulocytes: 0.02 10*3/uL (ref 0.00–0.07)
Basophils Absolute: 0.1 10*3/uL (ref 0.0–0.1)
Basophils Relative: 1 %
Eosinophils Absolute: 0.4 10*3/uL (ref 0.0–0.5)
Eosinophils Relative: 5 %
HCT: 46.9 % (ref 39.0–52.0)
Hemoglobin: 15.9 g/dL (ref 13.0–17.0)
Immature Granulocytes: 0 %
Lymphocytes Relative: 23 %
Lymphs Abs: 2.1 10*3/uL (ref 0.7–4.0)
MCH: 34.4 pg — ABNORMAL HIGH (ref 26.0–34.0)
MCHC: 33.9 g/dL (ref 30.0–36.0)
MCV: 101.5 fL — ABNORMAL HIGH (ref 80.0–100.0)
Monocytes Absolute: 0.8 10*3/uL (ref 0.1–1.0)
Monocytes Relative: 9 %
Neutro Abs: 5.6 10*3/uL (ref 1.7–7.7)
Neutrophils Relative %: 62 %
Platelets: 243 10*3/uL (ref 150–400)
RBC: 4.62 MIL/uL (ref 4.22–5.81)
RDW: 12.8 % (ref 11.5–15.5)
WBC: 9 10*3/uL (ref 4.0–10.5)
nRBC: 0 % (ref 0.0–0.2)

## 2019-04-22 LAB — BASIC METABOLIC PANEL
Anion gap: 14 (ref 5–15)
BUN: 20 mg/dL (ref 6–20)
CO2: 27 mmol/L (ref 22–32)
Calcium: 10 mg/dL (ref 8.9–10.3)
Chloride: 101 mmol/L (ref 98–111)
Creatinine, Ser: 1.43 mg/dL — ABNORMAL HIGH (ref 0.61–1.24)
GFR calc Af Amer: >60 mL/min (ref >60)
GFR calc non Af Amer: 58 mL/min — ABNORMAL LOW (ref >60)
Glucose, Bld: 100 mg/dL — ABNORMAL HIGH (ref 70–99)
Potassium: 4.9 mmol/L (ref 3.5–5.1)
Sodium: 142 mmol/L (ref 135–145)

## 2019-04-22 LAB — URINALYSIS, ROUTINE W REFLEX MICROSCOPIC
Bacteria, UA: NONE SEEN
Bilirubin Urine: NEGATIVE
Glucose, UA: NEGATIVE mg/dL
Hgb urine dipstick: NEGATIVE
Ketones, ur: NEGATIVE mg/dL
Nitrite: NEGATIVE
Protein, ur: NEGATIVE mg/dL
Specific Gravity, Urine: 1.017 (ref 1.005–1.030)
pH: 6 (ref 5.0–8.0)

## 2019-04-22 LAB — CBG MONITORING, ED: Glucose-Capillary: 95 mg/dL (ref 70–99)

## 2019-04-22 MED ORDER — MECLIZINE HCL 25 MG PO TABS: 25.0000 mg | ORAL_TABLET | Freq: Three times a day (TID) | ORAL | 0 refills | Status: DC | PRN

## 2019-04-22 MED ORDER — SODIUM CHLORIDE 0.9% FLUSH
3.0000 mL | Freq: Once | INTRAVENOUS | Status: AC
  Administered 2019-04-22: 12:00:00 3 mL via INTRAVENOUS
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
## 2019-04-22 NOTE — ED Triage Notes (Signed)
Patient complaining of dizziness x 3 days. States he had near syncopal episode 3 days ago but denies LOC. Also complains of chronic vomiting that has worsened x 1-2 months.
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
## 2019-04-22 NOTE — ED Notes (Signed)
ED Provider at bedside. 

## 2019-05-07 ENCOUNTER — Encounter (HOSPITAL_COMMUNITY): Payer: Self-pay | Admitting: *Deleted

## 2019-05-07 ENCOUNTER — Emergency Department (HOSPITAL_COMMUNITY)
Admission: EM | Admit: 2019-05-07 | Discharge: 2019-05-07 | Disposition: A | Discharge status: Home / Self Care | Payer: Medicaid Other | Attending: Emergency Medicine | Admitting: Emergency Medicine

## 2019-05-07 ENCOUNTER — Other Ambulatory Visit: Payer: Self-pay

## 2019-05-07 DIAGNOSIS — R112 Nausea with vomiting, unspecified: Secondary | ICD-10-CM | POA: Insufficient documentation

## 2019-05-07 DIAGNOSIS — Z7901 Long term (current) use of anticoagulants: Secondary | ICD-10-CM | POA: Insufficient documentation

## 2019-05-07 DIAGNOSIS — J45909 Unspecified asthma, uncomplicated: Secondary | ICD-10-CM | POA: Diagnosis not present

## 2019-05-07 DIAGNOSIS — F17210 Nicotine dependence, cigarettes, uncomplicated: Secondary | ICD-10-CM | POA: Insufficient documentation

## 2019-05-07 DIAGNOSIS — Z79899 Other long term (current) drug therapy: Secondary | ICD-10-CM | POA: Diagnosis not present

## 2019-05-07 DIAGNOSIS — R197 Diarrhea, unspecified: Secondary | ICD-10-CM | POA: Diagnosis not present

## 2019-05-07 DIAGNOSIS — I10 Essential (primary) hypertension: Secondary | ICD-10-CM | POA: Insufficient documentation

## 2019-05-07 DIAGNOSIS — R1110 Vomiting, unspecified: Secondary | ICD-10-CM

## 2019-05-07 DIAGNOSIS — F1210 Cannabis abuse, uncomplicated: Secondary | ICD-10-CM | POA: Insufficient documentation

## 2019-05-07 LAB — CBC WITH DIFFERENTIAL/PLATELET
Abs Immature Granulocytes: 0.02 10*3/uL (ref 0.00–0.07)
Basophils Absolute: 0.1 10*3/uL (ref 0.0–0.1)
Basophils Relative: 1 %
Eosinophils Absolute: 0.3 10*3/uL (ref 0.0–0.5)
Eosinophils Relative: 4 %
HCT: 44.9 % (ref 39.0–52.0)
Hemoglobin: 15.2 g/dL (ref 13.0–17.0)
Immature Granulocytes: 0 %
Lymphocytes Relative: 27 %
Lymphs Abs: 2.5 10*3/uL (ref 0.7–4.0)
MCH: 33.9 pg (ref 26.0–34.0)
MCHC: 33.9 g/dL (ref 30.0–36.0)
MCV: 100.2 fL — ABNORMAL HIGH (ref 80.0–100.0)
Monocytes Absolute: 0.7 10*3/uL (ref 0.1–1.0)
Monocytes Relative: 8 %
Neutro Abs: 5.4 10*3/uL (ref 1.7–7.7)
Neutrophils Relative %: 60 %
Platelets: 285 10*3/uL (ref 150–400)
RBC: 4.48 MIL/uL (ref 4.22–5.81)
RDW: 13 % (ref 11.5–15.5)
WBC: 9 10*3/uL (ref 4.0–10.5)
nRBC: 0 % (ref 0.0–0.2)

## 2019-05-07 LAB — COMPREHENSIVE METABOLIC PANEL
ALT: 15 U/L (ref 0–44)
AST: 23 U/L (ref 15–41)
Albumin: 3.7 g/dL (ref 3.5–5.0)
Alkaline Phosphatase: 65 U/L (ref 38–126)
Anion gap: 7 (ref 5–15)
BUN: 10 mg/dL (ref 6–20)
CO2: 26 mmol/L (ref 22–32)
Calcium: 8.4 mg/dL — ABNORMAL LOW (ref 8.9–10.3)
Chloride: 107 mmol/L (ref 98–111)
Creatinine, Ser: 1.27 mg/dL — ABNORMAL HIGH (ref 0.61–1.24)
GFR calc Af Amer: >60 mL/min (ref >60)
GFR calc non Af Amer: >60 mL/min (ref >60)
Glucose, Bld: 90 mg/dL (ref 70–99)
Potassium: 4.2 mmol/L (ref 3.5–5.1)
Sodium: 140 mmol/L (ref 135–145)
Total Bilirubin: 0.6 mg/dL (ref 0.3–1.2)
Total Protein: 6.7 g/dL (ref 6.5–8.1)

## 2019-05-07 LAB — URINALYSIS, ROUTINE W REFLEX MICROSCOPIC
Bacteria, UA: NONE SEEN
Bilirubin Urine: NEGATIVE
Glucose, UA: NEGATIVE mg/dL
Hgb urine dipstick: NEGATIVE
Ketones, ur: NEGATIVE mg/dL
Nitrite: NEGATIVE
Protein, ur: NEGATIVE mg/dL
Specific Gravity, Urine: 1.017 (ref 1.005–1.030)
pH: 7 (ref 5.0–8.0)

## 2019-05-07 LAB — RAPID URINE DRUG SCREEN, HOSP PERFORMED
Amphetamines: NOT DETECTED
Barbiturates: NOT DETECTED
Benzodiazepines: NOT DETECTED
Cocaine: NOT DETECTED
Opiates: NOT DETECTED
Tetrahydrocannabinol: POSITIVE — AB

## 2019-05-07 LAB — LIPASE, BLOOD: Lipase: 28 U/L (ref 11–51)

## 2019-05-07 MED ORDER — ONDANSETRON 4 MG PO TBDP: 4.0000 mg | ORAL_TABLET | Freq: Three times a day (TID) | ORAL | 0 refills | Status: DC | PRN

## 2019-05-07 MED ORDER — SODIUM CHLORIDE 0.9 % IV BOLUS (SEPSIS)
1000.0000 mL | Freq: Once | INTRAVENOUS | Status: AC
  Administered 2019-05-07: 1000 mL via INTRAVENOUS

## 2019-05-07 MED ORDER — ONDANSETRON HCL 4 MG/2ML IJ SOLN
4.0000 mg | Freq: Once | INTRAMUSCULAR | Status: AC
  Administered 2019-05-07: 4 mg via INTRAVENOUS
  Filled 2019-05-07: qty 2

## 2019-05-07 MED ORDER — SODIUM CHLORIDE 0.9 % IV SOLN
1000.0000 mL | INTRAVENOUS | Status: DC
  Administered 2019-05-07: 18:00:00 1000 mL via INTRAVENOUS

## 2019-05-07 NOTE — ED Provider Notes (Signed)
Pt's care turned over to me.  Pt given 2 liters Iv fluids.  Labs received,  Pt given zofran and po fluids.   Pt advised to follow up with his MD for recheck.    Fransico Meadow, PA-C 05/07/19 Rodriguez Hevia, [place], DO 05/09/19 1525

## 2019-05-07 NOTE — ED Triage Notes (Signed)
Patient reports nausea/vomiting for "years' with worsening over the last "few months".  Patient reports vomiting 6-7 times today.  Patient stated he has taken prescribed medication for nausea without any relief.

## 2019-05-07 NOTE — ED Provider Notes (Signed)
[facility] EMERGENCY DEPARTMENT Provider Note   CSN: [id number] Arrival date & time: 05/07/19  1457     History   Chief Complaint Chief Complaint  Patient presents with  . Emesis    HPI Lance Cohen is a 48 y.o. male.     Patient is a 48 year old male who presents to the emergency department with a complaint of vomiting.  Patient has a history of bipolar illness, deep vein thrombosis on Xarelto, hypertension, rectal bleeding, and chronic pain.  Patient states that he has frequent bouts with vomiting and with increased stools.  He also has problems with blood in his stools.  He had a colonoscopy in May by local GI specialist.  The patient states that this vomiting and nausea started about 3 or 4 days ago.  He says he is averaging about 6 episodes of vomiting daily.  States he is having multiple episodes of stool, but they are not diarrhea.  The patient is a smoker.  He says he uses marijuana frequently.  He says he drinks alcohol but only on special occasions.  He has not had any recent fever.  No chills.  There is been no recent changes in his diet.  He denies the use of NSAIDs.  He presents now for assistance with this issue.  The history is provided by the patient.  Emesis Associated symptoms: no abdominal pain, no chills, no cough, no diarrhea, no fever and no myalgias     Past Medical History:  Diagnosis Date  . Asthma   . Bipolar 1 disorder ([place])   . Chronic knee pain   . DVT (deep venous thrombosis) ([place])   . Hypertension   . Insomnia   . Suicide attempt [facility])    2010 Intentional overdose attempt with Depakote.    Patient Active Problem List   Diagnosis Date Noted  . Rectal bleeding 02/22/2019  . Priapism 02/08/2019  . Chest pain 10/19/2017  . Asthma 10/19/2017  . Hypertension 10/19/2017  . Chronic knee pain 10/19/2017  . Pain in limb 12/27/2016  . Acute pulmonary embolism ([place]) 10/23/2016  . Nausea and vomiting 10/23/2016  . S/P IVC filter 10/23/2016   . Tobacco abuse counseling 10/23/2016  . DVT (deep venous thrombosis) ([place]) 10/18/2016  . Cellulitis of foot 06/24/2011  . Bipolar 1 disorder ([place]) 06/24/2011  . Tobacco abuse 06/24/2011  . Polycythemia 06/24/2011  . Tinea pedis 06/24/2011  . CARPAL TUNNEL SYNDROME, RIGHT 06/21/2010  . CONTUSION, LEFT FOOT 06/05/2010  . FOREIGN BODY, FOOT 04/17/2010    Past Surgical History:  Procedure Laterality Date  . COLONOSCOPY WITH PROPOFOL N/A 03/18/2019   Procedure: COLONOSCOPY WITH PROPOFOL;  Surgeon: [name], MD;  [facility];  Service: Endoscopy;  Laterality: N/A;  2:30pm  . FOOT SURGERY     had peice of wire removed  . IVC FILTER REMOVAL N/A 02/24/2017   Procedure: IVC Filter Removal;  Surgeon: [name], MD;  Location: [place] [facility];  Service: Cardiovascular;  Laterality: N/A;  . PERIPHERAL VASCULAR CATHETERIZATION Right 10/22/2016   Procedure: Thrombectomy, thrombolyisis;  Surgeon: [name], MD;  Location: [place] [facility];  Service: Cardiovascular;  Laterality: Right;  . PERIPHERAL VASCULAR CATHETERIZATION N/A 10/22/2016   Procedure: IVC Filter Insertion;  Surgeon: [name], MD;  Location: [place] [facility];  Service: Cardiovascular;  Laterality: N/A;  . POLYPECTOMY  03/18/2019   Procedure: POLYPECTOMY;  Surgeon: [name], MD;  [facility];  Service: Endoscopy;;        Home Medications    Prior to Admission medications   Medication Sig Start Date End Date Taking? Authorizing Provider  rivaroxaban (XARELTO) 20 MG TABS tablet Take 20 mg by mouth daily with supper.   Yes [provider]  amLODipine (NORVASC) 10 MG tablet Take 10 mg by mouth daily.    [provider]  doxepin (SINEQUAN) 10 MG capsule Take 10 mg by mouth.    [provider]  doxepin (SINEQUAN) 10 MG capsule Take 10 mg by mouth.    [provider]  hydrOXYzine (ATARAX/VISTARIL) 50 MG tablet Take 50 mg by mouth 3  (three) times daily as needed.    [provider]  lisinopril (ZESTRIL) 10 MG tablet Take 10 mg by mouth daily.    [provider]  meclizine (ANTIVERT) 25 MG tablet Take 1 tablet (25 mg total) by mouth 3 (three) times daily as needed for dizziness. 04/22/19   Fredia Sorrow, MD  ondansetron (ZOFRAN ODT) 4 MG disintegrating tablet Take 1 tablet (4 mg total) by mouth every 8 (eight) hours as needed for nausea or vomiting. 12/15/18   Long, Wonda Olds, MD  ondansetron (ZOFRAN) 4 MG tablet Take 1 tablet (4 mg total) by mouth 3 (three) times daily before meals. Patient not taking: Reported on 04/22/2019 02/22/19   Carlis Stable, NP  Rivaroxaban 15 & 20 MG TBPK Take as directed on package: Start with one 15mg  tablet by mouth twice a day with food. On Day 22, switch to one 20mg  tablet once a day with food. 10/10/18   Sherwood Gambler, MD    Family History Family History  Problem Relation Age of Onset  . Cancer Mother   . Heart attack Father   . Colon cancer Neg Hx     Social History Social History   Tobacco Use  . Smoking status: Current Every Day Smoker    Packs/day: 0.25    Types: Cigarettes  . Smokeless tobacco: Never Used  Substance Use Topics  . Alcohol use: Yes    Comment: occasionally: on weekends (typically a pint on the weekend) as of 02/22/2019  . Drug use: Yes    Types: Marijuana     Allergies   Bee venom, Penicillins, and Sulfa antibiotics   Review of Systems Review of Systems  Constitutional: Positive for activity change and appetite change. Negative for chills and fever.  HENT: Negative for congestion, ear discharge, ear pain, facial swelling, nosebleeds, rhinorrhea, sneezing and tinnitus.   Eyes: Negative for photophobia, pain and discharge.  Respiratory: Negative for cough, choking, shortness of breath and wheezing.   Cardiovascular: Negative for chest pain, palpitations and leg swelling.  Gastrointestinal: Positive for blood in stool, nausea and vomiting.  Negative for abdominal pain, constipation and diarrhea.  Genitourinary: Negative for difficulty urinating, dysuria, flank pain, frequency and hematuria.  Musculoskeletal: Negative for back pain, gait problem, myalgias and neck pain.  Skin: Negative for color change, rash and wound.  Neurological: Negative for dizziness, seizures, syncope, facial asymmetry, speech difficulty, weakness and numbness.  Hematological: Negative for adenopathy. Does not bruise/bleed easily.  Psychiatric/Behavioral: Negative for agitation, confusion, hallucinations, self-injury and suicidal ideas. The patient is not nervous/anxious.      Physical Exam Updated Vital Signs BP 136/84 (BP Location: Right Arm)   Pulse 66   Temp 98.2 F (36.8 C) (Oral)   Resp 18   Ht 5\' 11"  (1.803 m)   Wt 106.6 kg  SpO2 98%   BMI 32.78 kg/m   Physical Exam Vitals signs and nursing note reviewed.  Constitutional:      Appearance: He is well-developed. He is not toxic-appearing.  HENT:     Head: Normocephalic.     Right Ear: Tympanic membrane and external ear normal.     Left Ear: Tympanic membrane and external ear normal.  Eyes:     General: Lids are normal.     Pupils: Pupils are equal, round, and reactive to light.  Neck:     Musculoskeletal: Normal range of motion and neck supple.     Vascular: No carotid bruit.  Cardiovascular:     Rate and Rhythm: Normal rate and regular rhythm.     Pulses: Normal pulses.     Heart sounds: Normal heart sounds.  Pulmonary:     Effort: No respiratory distress.     Breath sounds: Normal breath sounds.  Abdominal:     General: Bowel sounds are normal.     Palpations: Abdomen is soft.     Tenderness: There is abdominal tenderness. There is no guarding.     Comments: Diffuse abdominal soreness.  Worse in the right and left lower abdomen to palpation.  Musculoskeletal: Normal range of motion.  Lymphadenopathy:     Head:     Right side of head: No submandibular adenopathy.      Left side of head: No submandibular adenopathy.     Cervical: No cervical adenopathy.  Skin:    General: Skin is warm and dry.     Coloration: Skin is not pale.     Findings: No rash.  Neurological:     Mental Status: He is alert and oriented to person, place, and time.     Cranial Nerves: No cranial nerve deficit.     Sensory: No sensory deficit.  Psychiatric:        Speech: Speech normal.      ED Treatments / Results  Labs (all labs ordered are listed, but only abnormal results are displayed) Labs Reviewed  CBC WITH DIFFERENTIAL/PLATELET - Abnormal; Notable for the following components:      Result Value   MCV 100.2 (*)    All other components within normal limits  COMPREHENSIVE METABOLIC PANEL  LIPASE, BLOOD  URINALYSIS, ROUTINE W REFLEX MICROSCOPIC  RAPID URINE DRUG SCREEN, HOSP PERFORMED    EKG None  Radiology No results found.  Procedures Procedures (including critical care time)  Medications Ordered in ED Medications  sodium chloride 0.9 % bolus 1,000 mL (1,000 mLs Intravenous New Bag/Given 05/07/19 1732)    Followed by  0.9 %  sodium chloride infusion (1,000 mLs Intravenous New Bag/Given 05/07/19 1732)  ondansetron (ZOFRAN) injection 4 mg (has no administration in time range)     Initial Impression / Assessment and Plan / ED Course  I have reviewed the triage vital signs and the nursing notes.  Pertinent labs & imaging results that were available during my care of the patient were reviewed by me and considered in my medical decision making (see chart for details).          Final Clinical Impressions(s) / ED Diagnoses MDM  Patient has a history.  Blood in stool.  Patient has a history non-intractable vomiting.  Patient is on Xarelto.  IV fluids started.  Vital signs stable on admission. Urine drug screen is positive for marijuana.  Question if this issue could be related to hyperemesis related to the marijuana use.  The urine  analysis shows the  elevation in the leukocyte esterase that is small.  There are 11-20 white blood cells present.  We will send the culture to the lab. Work-up pending.  Patient's care to the continued by Brandon Melnick, PA-C.   Final diagnoses:  None    ED Discharge Orders    None       Lily Kocher, PA-C 05/07/19 2042    Francine Graven, DO 05/09/19 1525

## 2019-05-07 NOTE — Discharge Instructions (Addendum)
Return if any problems.  See your Gi doctor for recheck.

## 2019-06-21 ENCOUNTER — Ambulatory Visit (HOSPITAL_COMMUNITY): Payer: Medicaid Other | Admitting: Hematology

## 2019-06-23 ENCOUNTER — Other Ambulatory Visit: Payer: Self-pay

## 2019-06-23 ENCOUNTER — Encounter (HOSPITAL_COMMUNITY): Payer: Self-pay | Admitting: Emergency Medicine

## 2019-06-23 ENCOUNTER — Emergency Department (HOSPITAL_COMMUNITY)
Admission: EM | Admit: 2019-06-23 | Discharge: 2019-06-23 | Disposition: A | Discharge status: Home / Self Care | Payer: Medicaid Other | Attending: Emergency Medicine | Admitting: Emergency Medicine

## 2019-06-23 DIAGNOSIS — Z7901 Long term (current) use of anticoagulants: Secondary | ICD-10-CM | POA: Diagnosis not present

## 2019-06-23 DIAGNOSIS — I82511 Chronic embolism and thrombosis of right femoral vein: Secondary | ICD-10-CM

## 2019-06-23 DIAGNOSIS — M62838 Other muscle spasm: Secondary | ICD-10-CM

## 2019-06-23 DIAGNOSIS — J45909 Unspecified asthma, uncomplicated: Secondary | ICD-10-CM | POA: Diagnosis not present

## 2019-06-23 DIAGNOSIS — F17210 Nicotine dependence, cigarettes, uncomplicated: Secondary | ICD-10-CM | POA: Diagnosis not present

## 2019-06-23 DIAGNOSIS — I10 Essential (primary) hypertension: Secondary | ICD-10-CM | POA: Insufficient documentation

## 2019-06-23 DIAGNOSIS — Z79899 Other long term (current) drug therapy: Secondary | ICD-10-CM | POA: Insufficient documentation

## 2019-06-23 DIAGNOSIS — M25511 Pain in right shoulder: Secondary | ICD-10-CM | POA: Diagnosis present

## 2019-06-23 MED ORDER — HYDROCODONE-ACETAMINOPHEN 5-325 MG PO TABS: 1.0000 | ORAL_TABLET | Freq: Four times a day (QID) | ORAL | 0 refills | Status: DC | PRN

## 2019-06-23 MED ORDER — DICLOFENAC SODIUM 1 % TD GEL: 2.0000 g | Freq: Three times a day (TID) | TRANSDERMAL | 0 refills | Status: DC

## 2019-06-23 NOTE — ED Triage Notes (Signed)
Patient reports blood clot in hiss groin that has started hurting again, dx Dec 2019. Patient also c/o R shoulder pain, no known injury.

## 2019-06-23 NOTE — ED Notes (Signed)
C/o right shoulder for 3 weeks,seen at [facility] 06/08/2019 and treated with methocarbamol 500 mg.  Pt not able to lift about shoulder level.  Rates pain 10/10.  Denies any injury or fall.

## 2019-06-23 NOTE — ED Provider Notes (Signed)
[facility] Provider Note   CSN: [id number] Arrival date & time: 06/23/19  1349     History   Chief Complaint Chief Complaint  Patient presents with  . Shoulder Pain  . Groin Pain    HPI [facility] is a 48 y.o. 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  He was evaluated at the health department on 06/08/2019 and prescribed Robaxin.  His pain is to the right shoulder between the shoulder and the neck, as well as the right lateral neck.  The pain is worse with movement and lifting his arm.  He has no known injuries.  Hot showers make his symptoms better.  He states sometimes he feels some numbness in his fingers though no numbness in his arm.  He had a cramp in his right hand the other day which caused him to lose grip strength. She has second complaint of worsening pain in his right groin.  He has had chronic pain in this area since being diagnosed with a DVT in the right femoral in December 2019.  He is followed by [facility].  He is chronically on Xarelto, has not missed any doses.  He states he is always had pain in that groin since the DVT, however feels like it may be worsening a little bit.  He denies any new redness or swelling to the right leg.  Per chart review, he was last evaluated by his vascular specialist in March.  No further interventions were recommended during the visit for his chronic pain related to his DVT.  He denies urinary symptoms, testicular pain or swelling.    The history is provided by the patient and medical records.    Past Medical History:  Diagnosis Date  . Asthma   . Bipolar 1 disorder ([place])   . Chronic knee pain   . DVT (deep venous thrombosis) ([place])   . Hypertension   . Insomnia   . Suicide attempt [facility])    2010 Intentional overdose  attempt with Depakote.    Patient Active Problem List   Diagnosis Date Noted  . Rectal bleeding 02/22/2019  . Priapism 02/08/2019  . Chest pain 10/19/2017  . Asthma 10/19/2017  . Hypertension 10/19/2017  . Chronic knee pain 10/19/2017  . Pain in limb 12/27/2016  . Acute pulmonary embolism ([place]) 10/23/2016  . Nausea and vomiting 10/23/2016  . S/P IVC filter 10/23/2016  . Tobacco abuse counseling 10/23/2016  . DVT (deep venous thrombosis) ([place]) 10/18/2016  . Cellulitis of foot 06/24/2011  . Bipolar 1 disorder ([place]) 06/24/2011  . Tobacco abuse 06/24/2011  . Polycythemia 06/24/2011  . Tinea pedis 06/24/2011  . CARPAL TUNNEL SYNDROME, RIGHT 06/21/2010  . CONTUSION, LEFT FOOT 06/05/2010  . FOREIGN BODY, FOOT 04/17/2010    Past Surgical History:  Procedure Laterality Date  . COLONOSCOPY WITH PROPOFOL N/A 03/18/2019   Procedure: COLONOSCOPY WITH PROPOFOL;  Surgeon: [name], MD;  [facility];  Service: Endoscopy;  Laterality: N/A;  2:30pm  . FOOT SURGERY     had peice of wire removed  . IVC FILTER REMOVAL N/A 02/24/2017   Procedure: IVC Filter Removal;  Surgeon: [name], MD;  Location: [place] [facility];  Service: Cardiovascular;  Laterality: N/A;  . PERIPHERAL VASCULAR CATHETERIZATION Right 10/22/2016   Procedure: Thrombectomy, thrombolyisis;  Surgeon: [name], MD;  Location: [place] [facility];  Service: Cardiovascular;  Laterality: Right;  . PERIPHERAL VASCULAR CATHETERIZATION N/A 10/22/2016   Procedure: IVC Filter Insertion;  Surgeon: [name], MD;  Location: [place] [facility];  Service: Cardiovascular;  Laterality: N/A;  . POLYPECTOMY  03/18/2019   Procedure: POLYPECTOMY;  Surgeon: [name], MD;  [facility];  Service: Endoscopy;;        Home Medications    Prior to Admission medications   Medication Sig Start Date End Date Taking? Authorizing Provider  amLODipine (NORVASC) 10 MG tablet Take 10 mg by mouth  daily.    [provider]  diclofenac sodium (VOLTAREN) 1 % GEL Apply 2 g topically 3 (three) times daily. 06/23/19   Robinson, [place] N, PA-C  doxepin (SINEQUAN) 10 MG capsule Take 10 mg by mouth.    [provider]  HYDROcodone-acetaminophen (NORCO/VICODIN) 5-325 MG tablet Take 1 tablet by mouth every 6 (six) hours as needed for severe pain. 06/23/19   Robinson, [place] N, PA-C  hydrOXYzine (ATARAX/VISTARIL) 50 MG tablet Take 50 mg by mouth 3 (three) times daily as needed.    [provider]  lisinopril (ZESTRIL) 10 MG tablet Take 10 mg by mouth daily.    [provider]  meclizine (ANTIVERT) 25 MG tablet Take 1 tablet (25 mg total) by mouth 3 (three) times daily as needed for dizziness. 04/22/19   Fredia Sorrow, MD  ondansetron (ZOFRAN ODT) 4 MG disintegrating tablet Take 1 tablet (4 mg total) by mouth every 8 (eight) hours as needed for nausea or vomiting. 05/07/19   Fransico Meadow, PA-C  rivaroxaban (XARELTO) 20 MG TABS tablet Take 20 mg by mouth daily with supper.    [provider]    Family History Family History  Problem Relation Age of Onset  . Cancer Mother   . Heart attack Father   . Colon cancer Neg Hx     Social History Social History   Tobacco Use  . Smoking status: Current Every Day Smoker    Packs/day: 0.25    Types: Cigarettes  . Smokeless tobacco: Never Used  Substance Use Topics  . Alcohol use: Yes    Comment: occasionally: on weekends (typically a pint on the weekend) as of 02/22/2019  . Drug use: Yes    Types: Marijuana    Comment: this am     Allergies   Bee venom, Penicillins, and Sulfa antibiotics   Review of Systems Review of Systems  All other systems reviewed and are negative.    Physical Exam Updated Vital Signs BP (!) 143/82   Pulse 74   Temp 98.7 F (37.1 C) (Oral)   Resp 14   Ht 5\' 11"  (1.803 m)   Wt 106.6 kg   SpO2 100%   BMI 32.78 kg/m   Physical Exam Vitals signs and nursing note  reviewed.  Constitutional:      Appearance: He is well-developed.  HENT:     Head: Normocephalic and atraumatic.  Eyes:     Conjunctiva/sclera: Conjunctivae normal.  Cardiovascular:     Rate and Rhythm: Normal rate and regular rhythm.     Comments: Intact PT pulses bilaterally. Pulmonary:     Effort: Pulmonary effort is normal. No respiratory distress.     Breath sounds: Normal breath sounds.  Abdominal:     Palpations: Abdomen is soft.  Genitourinary:    Comments: Exam performed with NT chaperone present.  There  is some generalized tenderness to the right inguinal region.  Genital exam deferred/declined by patient. Musculoskeletal:       Back:     Comments: There is tenderness to the right trapezius muscle group with palpable spasm.  There is also some tenderness to the posterior right SCM posterior to the right ear.  There is no midline C-spine or paraspinal tenderness.  There is no pain with passive range of motion of the right shoulder joint.  No skin changes. There is trace pretibial edema to bilateral lower extremities.  No redness or warmth to right lower extremity.  There is no tenderness to right thigh or calf.  Skin:    General: Skin is warm.  Neurological:     Mental Status: He is alert.     Comments: Patient has strong and equal grip strength to bilateral upper extremities.  He has normal sensation to light touch of all digits as well as to bilateral deltoid regions and lower arms.  He is equal radial pulses bilateral upper extremities.  Psychiatric:        Behavior: Behavior normal.      ED Treatments / Results  Labs (all labs ordered are listed, but only abnormal results are displayed) Labs Reviewed - No data to display  EKG None  Radiology No results found.  Procedures Procedures (including critical care time)  Medications Ordered in ED Medications - No data to display   Initial Impression / Assessment and Plan / ED Course  I have reviewed the triage  vital signs and the nursing notes.  Pertinent labs & imaging results that were available during my care of the patient were reviewed by me and considered in my medical decision making (see chart for details).  Clinical Course as of Jun 22 1910  Wed Jun 23, 2019  1823 Discussed topical voltaren with pharmacist as pt on xarelto. Recommends should be no issue given little systemic absorption. Recommends no more than TID, 1g dose.   [JR]    Clinical Course User Index [JR] Robinson, [place] N, PA-C       Patient denting with chronic pain related to his chronic DVT of the right femoral vein.  He reports his pain is worsening though no new swelling or redness.  Exam with tenderness to the right inguinal region.  He has been compliant with his Xarelto.  Discussed repeat ultrasound for any worsening DVT, however it is currently unavailable at this hour in this ED.  I recommend appointment in the morning to return for ultrasound, however patient states he is unable to get back to the hospital for at least 4 days.  Given this duration, I recommend patient call his vascular specialist tomorrow for close follow-up and provided strict return precautions. Regarding patient's right shoulder pain.  Suspect this is musculoskeletal in nature, likely due to muscle spasm.  There is no midline spinal or paraspinal tenderness of the C-spine.  He has strong grip strength and normal sensation and pulses to bilateral upper extremities.  Do not believe CT imaging will be beneficial at this time.  I recommend symptomatic management, continue muscle relaxer.  Topical heat as this provided relief.  He also will be prescribed a topical diclofenac for pain and given a short prescription of Norco given his acute pain to his shoulder and groin.  He is instructed to follow close with PCP.  Discussed results, findings, treatment and follow up. Patient advised of return precautions. Patient verbalized understanding and agreed with  plan.  Final Clinical Impressions(s) / ED Diagnoses   Final diagnoses:  Muscle spasm of shoulder region  Chronic deep vein thrombosis (DVT) of femoral vein of right lower extremity [place])    ED Discharge Orders         Ordered    diclofenac sodium (VOLTAREN) 1 % GEL  3 times daily     06/23/19 1832    HYDROcodone-acetaminophen (NORCO/VICODIN) 5-325 MG tablet  Every 6 hours PRN     06/23/19 1832           Robinson, [place] N, PA-C 06/23/19 1913    Hayden Rasmussen, MD 06/24/19 [id number]

## 2019-06-23 NOTE — Discharge Instructions (Addendum)
Apply heat to your shoulder for 20 minutes at a time.  Continue taking the Robaxin/methocarbamol as prescribed for muscle spasm. You can apply the Voltaren/diclofenac gel as prescribed up to 3 times daily to your right shoulder for pain.  Do not apply this to open wounds or sores on your skin. We are unable to schedule an ultrasound of your leg for so many days out.  It is recommended you follow-up closely with your vascular specialist regarding your ongoing pain of your right leg/groin. Return to the emergency department acutely worsening pain or swelling to right leg.

## 2019-06-23 NOTE — ED Notes (Signed)
PA at bedside.

## 2019-06-29 ENCOUNTER — Ambulatory Visit: Payer: Medicaid Other | Admitting: Nurse Practitioner

## 2019-06-29 ENCOUNTER — Encounter: Payer: Self-pay | Admitting: Nurse Practitioner

## 2019-06-29 ENCOUNTER — Other Ambulatory Visit: Payer: Self-pay

## 2019-06-29 VITALS — BP 99/62 | HR 64 | Temp 96.6°F | Ht 71.0 in | Wt 229.8 lb

## 2019-06-29 DIAGNOSIS — K219 Gastro-esophageal reflux disease without esophagitis: Secondary | ICD-10-CM | POA: Insufficient documentation

## 2019-06-29 DIAGNOSIS — K625 Hemorrhage of anus and rectum: Secondary | ICD-10-CM | POA: Diagnosis not present

## 2019-06-29 DIAGNOSIS — K6289 Other specified diseases of anus and rectum: Secondary | ICD-10-CM | POA: Diagnosis not present

## 2019-06-29 MED ORDER — OMEPRAZOLE 40 MG PO CPDR: 40.0000 mg | DELAYED_RELEASE_CAPSULE | Freq: Every day | ORAL | 2 refills | Status: DC

## 2019-06-29 NOTE — Assessment & Plan Note (Signed)
As per HPI the patient complains of symptoms akin to GERD.  He takes Tums as needed but this is not very effective on an intermittent basis.  He is not currently on a PPI.  He does note some weight loss but it is difficult to corroborate this with objective readings.  At this point I will start him on a PPI, follow-up in 3 months, call for any worsening or severe symptoms.  At his next visit we can check his weight and if he is having actual weight loss we can consider possible upper endoscopy.  Recent CT scan at the end of 2019 was unremarkable.  Follow-up in 3 months.

## 2019-06-29 NOTE — Patient Instructions (Signed)
Your health issues we discussed today were:   Rectal pain and rectal bleeding: 1. I am glad these are doing better 2. Let us know if you have any worsening symptoms and we can consider topical creams versus banding  GERD (reflux/heartburn): 1. I have sent in Prilosec 40 mg to your pharmacy 2. Take this once a day, for sing in the morning on empty stomach 3. You can use Tums as needed for any GERD symptoms despite taking Prilosec daily 4. Call us if you have any worsening or severe symptoms  Overall I recommend:  1. Continue your current medications 2. Return for follow-up in 3 months 3. Call us if you have any questions or concerns.   Because of recent events of COVID-19 ("Coronavirus"), follow CDC recommendations:  1. Wash your hand frequently 2. Avoid touching your face 3. Stay away from people who are sick 4. If you have symptoms such as fever, cough, shortness of breath then call your healthcare provider for further guidance 5. If you are sick, STAY AT HOME unless otherwise directed by your healthcare provider. 6. Follow directions from state and national officials regarding staying safe   At [facility] we value your feedback. You may receive a survey about your visit today. Please share your experience as we strive to create trusting relationships with our patients to provide genuine, compassionate, quality care.  We appreciate your understanding and patience as we review any laboratory studies, imaging, and other diagnostic tests that are ordered as we care for you. Our office policy is 5 business days for review of these results, and any emergent or urgent results are addressed in a timely manner for your best interest. If you do not hear from our office in 1 week, please contact us.   We also encourage the use of MyChart, which contains your medical information for your review as well. If you are not enrolled in this feature, an access code is on this after  visit summary for your convenience. Thank you for allowing [place] to be involved in your care.  It was great to see you today!  I hope you have a great Fall!!

## 2019-06-29 NOTE — Assessment & Plan Note (Signed)
Rectal bleeding is significantly improved but mildly persistent.  Noted internal hemorrhoids on his recent colonoscopy.  If he has any further rectal bleeding we can discuss hemorrhoid banding.  I have mentioned this briefly to the patient and he seems amendable.  Continue to monitor and follow-up in 3 months.

## 2019-06-29 NOTE — Assessment & Plan Note (Signed)
Rectal pain which she attributes to generalized lower abdominal/groin pain related to his chronic DVT as per HPI.  Rectal pain has improved.  Query possibility of recent colonoscopy flaring is internal hemorrhoids.  He does not want anything for his rectal pain at this time.  Recommend he continue his current medications and notify us of any worsening symptoms.  Follow-up in 3 months.

## 2019-06-29 NOTE — Progress Notes (Signed)
Referring Provider: Raiford Simmonds., PA-C Primary Care Physician:  Raiford Simmonds., PA-C Primary GI:  Dr. Gala Romney  Chief Complaint  Patient presents with  . Rectal Pain    since had tcs  . Rectal Bleeding    occ    HPI:   [facility] is a 48 y.o. male who presents for rectal pain.  The patient was last seen in our office 02/22/2019 for non-intractable nausea and vomiting, rectal bleeding.  The patient's last office visit was a virtual visit due to coronavirus/COVID-19 pandemic.  Known history of hemorrhoids. Previously lower abdominal pain with recent labs including CBC, CMP, lipase all normal.  History of rectal bleeding previously with fecal occult blood testing positive.  Recently saw vascular surgery due to chronic DVT noted to be occlusive or near occlusive in the right femoral region.  Deemed no role for intervention, continue aspirin versus chronic anticoagulation.  At his last visit he noted hematochezia 3 to 4 months, most recently the previous week.  Lower abdominal pain likely from blood clot in his groin.  No previous colonoscopy.  No other GI complaints.  Currently on Xarelto.  Recommended colonoscopy, refill Zofran, follow-up in 4 months.  Colonoscopy completed 03/18/2019 which found a single 6 mm polyp in the splenic flexure, diverticulosis in the sigmoid and descending colons, nonbleeding internal hemorrhoids, otherwise normal.  Suspected trivial bleeding from hemorrhoids.  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  Recommended Benefiber daily for 3 weeks then twice daily thereafter.  Repeat colonoscopy in 7 years (2027).  Today he states he's doing ok overall. He states he had some lower abdominal discomfort, but has known blood clot. Thinks he has an ulcer because his face turned red and "I hit the floor" when he drank a cold soda. Has N/V with certain triggers. Has quit eating red meat and now not vomiting much. Not sur eif he is having rectal pain or  blood clot issues. Much better now. Intermittent rectal bleeding, not "half as bad as it was." Has regular heartburn/reflux and is only taking TUMS/Rolaids which doesn't really help. Notes esophageal burning, bitter taste, "gut is on fire" when he has a flare and will sometimes vomit from this. Milk is the only thing that has helped his GERD symptoms. Denies fever, chills. Subjective weight loss of at least 10 lbs in 3 days (between visits at the health department.) Poor appetite since DVT and IVC filter placed. Denies URI or flu-like symptoms. Denies loss of sense of taste or smell. Denies chest pain, dyspnea, dizziness, lightheadedness, syncope, near syncope. Denies any other upper or lower GI symptoms.  Past Medical History:  Diagnosis Date  . Asthma   . Bipolar 1 disorder ([place])   . Chronic knee pain   . DVT (deep venous thrombosis) ([place])   . Hypertension   . Insomnia   . Suicide attempt [facility])    2010 Intentional overdose attempt with Depakote.    Past Surgical History:  Procedure Laterality Date  . COLONOSCOPY WITH PROPOFOL N/A 03/18/2019   Procedure: COLONOSCOPY WITH PROPOFOL;  Surgeon: [name], MD;  [facility];  Service: Endoscopy;  Laterality: N/A;  2:30pm  . FOOT SURGERY     had peice of wire removed  . IVC FILTER REMOVAL N/A 02/24/2017   Procedure: IVC Filter Removal;  Surgeon: [name], MD;  Location: [place] [facility] LAB;  Service: Cardiovascular;  Laterality: N/A;  . PERIPHERAL VASCULAR CATHETERIZATION Right 10/22/2016  Procedure: Thrombectomy, thrombolyisis;  Surgeon: [name], MD;  Location: [place] [facility];  Service: Cardiovascular;  Laterality: Right;  . PERIPHERAL VASCULAR CATHETERIZATION N/A 10/22/2016   Procedure: IVC Filter Insertion;  Surgeon: [name], MD;  Location: [place] [facility] LAB;  Service: Cardiovascular;  Laterality: N/A;  . POLYPECTOMY  03/18/2019   Procedure: POLYPECTOMY;  Surgeon: [name], MD;  [facility];  Service: Endoscopy;;    Current Outpatient Medications  Medication Sig Dispense Refill  . amLODipine (NORVASC) 10 MG tablet Take 10 mg by mouth daily.    . diclofenac sodium (VOLTAREN) 1 % GEL Apply 2 g topically 3 (three) times daily. 50 g 0  . doxepin (SINEQUAN) 10 MG capsule Take 10 mg by mouth.    . hydrOXYzine (ATARAX/VISTARIL) 50 MG tablet Take 50 mg by mouth 3 (three) times daily as needed.    Marland Kitchen lisinopril (ZESTRIL) 10 MG tablet Take 10 mg by mouth daily.    . methocarbamol (ROBAXIN) 500 MG tablet Take 1,000 mg by mouth 2 (two) times daily.    . ondansetron (ZOFRAN ODT) 4 MG disintegrating tablet Take 1 tablet (4 mg total) by mouth every 8 (eight) hours as needed for nausea or vomiting. 20 tablet 0  . rivaroxaban (XARELTO) 20 MG TABS tablet Take 20 mg by mouth daily with supper.     No current facility-administered medications for this visit.     Allergies as of 06/29/2019 - Review Complete 06/29/2019  Allergen Reaction Noted  . Bee venom Swelling 12/08/2013  . Penicillins Other (See Comments)   . Sulfa antibiotics Itching 11/13/2013    Family History  Problem Relation Age of Onset  . Cancer Mother   . Heart attack Father   . Colon cancer Neg Hx     Social History   Socioeconomic History  . Marital status: Married    Spouse name: Not on file  . Number of children: Not on file  . Years of education: Not on file  . Highest education level: Not on file  Occupational History  . Not on file  Social Needs  . Financial resource strain: Not on file  . Food insecurity    Worry: Not on file    Inability: Not on file  . Transportation needs    Medical: Not on file    Non-medical: Not on file  Tobacco Use  . Smoking status: Current Every Day Smoker    Packs/day: 0.25    Types: Cigarettes  . Smokeless tobacco: Never Used  Substance and Sexual Activity  . Alcohol use: Yes    Comment: occasionally: on weekends (typically a pint on the weekend)  . Drug  use: Yes    Types: Marijuana    Comment: smokes for pain  . Sexual activity: Yes  Lifestyle  . Physical activity    Days per week: Not on file    Minutes per session: Not on file  . Stress: Not on file  Relationships  . Social Herbalist on phone: Not on file    Gets together: Not on file    Attends religious service: Not on file    Active member of club or organization: Not on file    Attends meetings of clubs or organizations: Not on file    Relationship status: Not on file  Other Topics Concern  . Not on file  Social History Narrative  . Not on file    Review of  Systems: General: Negative for anorexia, weight loss, fever, chills, fatigue, weakness. ENT: Negative for hoarseness, difficulty swallowing. CV: Negative for chest pain, angina, palpitations, peripheral edema.  Respiratory: Negative for dyspnea at rest, cough, sputum, wheezing.  GI: See history of present illness. Endo: Negative for unusual weight change.  Heme: Negative for bruising or bleeding. Allergy: Negative for rash or hives.   Physical Exam: BP 99/62   Pulse 64   Temp (!) 96.6 F (35.9 C) (Temporal)   Ht 5\' 11"  (1.803 m)   Wt 229 lb 12.8 oz (104.2 kg)   BMI 32.05 kg/m  General:   Alert and oriented. Pleasant and cooperative. Well-nourished and well-developed.  Eyes:  Without icterus, sclera clear and conjunctiva pink.  Ears:  Normal auditory acuity. Cardiovascular:  S1, S2 present without murmurs appreciated. Extremities without clubbing or edema. Respiratory:  Clear to auscultation bilaterally. No wheezes, rales, or rhonchi. No distress.  Gastrointestinal:  +BS, soft, non-tender and non-distended. No HSM noted. No guarding or rebound. No masses appreciated.  Rectal:  Deferred  Musculoskalatal:  Symmetrical without gross deformities. Neurologic:  Alert and oriented x4;  grossly normal neurologically. Psych:  Alert and cooperative. Normal mood and affect. Heme/Lymph/Immune: No excessive  bruising noted.    06/29/2019 9:10 AM   Disclaimer: This note was dictated with voice recognition software. Similar sounding words can inadvertently be transcribed and may not be corrected upon review.

## 2019-07-03 ENCOUNTER — Emergency Department (HOSPITAL_COMMUNITY)
Admission: EM | Admit: 2019-07-03 | Discharge: 2019-07-03 | Disposition: A | Discharge status: Home / Self Care | Payer: Medicaid Other | Attending: Emergency Medicine | Admitting: Emergency Medicine

## 2019-07-03 ENCOUNTER — Emergency Department (HOSPITAL_COMMUNITY): Payer: Medicaid Other

## 2019-07-03 ENCOUNTER — Other Ambulatory Visit: Payer: Self-pay

## 2019-07-03 ENCOUNTER — Encounter (HOSPITAL_COMMUNITY): Payer: Self-pay | Admitting: Emergency Medicine

## 2019-07-03 DIAGNOSIS — I10 Essential (primary) hypertension: Secondary | ICD-10-CM | POA: Diagnosis not present

## 2019-07-03 DIAGNOSIS — R42 Dizziness and giddiness: Secondary | ICD-10-CM | POA: Diagnosis not present

## 2019-07-03 DIAGNOSIS — F17210 Nicotine dependence, cigarettes, uncomplicated: Secondary | ICD-10-CM | POA: Diagnosis not present

## 2019-07-03 DIAGNOSIS — Z79899 Other long term (current) drug therapy: Secondary | ICD-10-CM | POA: Diagnosis not present

## 2019-07-03 DIAGNOSIS — J45909 Unspecified asthma, uncomplicated: Secondary | ICD-10-CM | POA: Diagnosis not present

## 2019-07-03 DIAGNOSIS — R0602 Shortness of breath: Secondary | ICD-10-CM | POA: Insufficient documentation

## 2019-07-03 DIAGNOSIS — Z7901 Long term (current) use of anticoagulants: Secondary | ICD-10-CM | POA: Diagnosis not present

## 2019-07-03 LAB — CBC WITH DIFFERENTIAL/PLATELET
Abs Immature Granulocytes: 0.03 10*3/uL (ref 0.00–0.07)
Basophils Absolute: 0.1 10*3/uL (ref 0.0–0.1)
Basophils Relative: 1 %
Eosinophils Absolute: 0.8 10*3/uL — ABNORMAL HIGH (ref 0.0–0.5)
Eosinophils Relative: 9 %
HCT: 48.9 % (ref 39.0–52.0)
Hemoglobin: 16.6 g/dL (ref 13.0–17.0)
Immature Granulocytes: 0 %
Lymphocytes Relative: 30 %
Lymphs Abs: 2.8 10*3/uL (ref 0.7–4.0)
MCH: 34.7 pg — ABNORMAL HIGH (ref 26.0–34.0)
MCHC: 33.9 g/dL (ref 30.0–36.0)
MCV: 102.3 fL — ABNORMAL HIGH (ref 80.0–100.0)
Monocytes Absolute: 0.7 10*3/uL (ref 0.1–1.0)
Monocytes Relative: 8 %
Neutro Abs: 4.7 10*3/uL (ref 1.7–7.7)
Neutrophils Relative %: 52 %
Platelets: 303 10*3/uL (ref 150–400)
RBC: 4.78 MIL/uL (ref 4.22–5.81)
RDW: 12.4 % (ref 11.5–15.5)
WBC: 9.1 10*3/uL (ref 4.0–10.5)
nRBC: 0 % (ref 0.0–0.2)

## 2019-07-03 LAB — COMPREHENSIVE METABOLIC PANEL
ALT: 18 U/L (ref 0–44)
AST: 24 U/L (ref 15–41)
Albumin: 4.2 g/dL (ref 3.5–5.0)
Alkaline Phosphatase: 79 U/L (ref 38–126)
Anion gap: 8 (ref 5–15)
BUN: 12 mg/dL (ref 6–20)
CO2: 26 mmol/L (ref 22–32)
Calcium: 9 mg/dL (ref 8.9–10.3)
Chloride: 103 mmol/L (ref 98–111)
Creatinine, Ser: 1.17 mg/dL (ref 0.61–1.24)
GFR calc Af Amer: >60 mL/min (ref >60)
GFR calc non Af Amer: >60 mL/min (ref >60)
Glucose, Bld: 113 mg/dL — ABNORMAL HIGH (ref 70–99)
Potassium: 4.1 mmol/L (ref 3.5–5.1)
Sodium: 137 mmol/L (ref 135–145)
Total Bilirubin: 0.6 mg/dL (ref 0.3–1.2)
Total Protein: 7.8 g/dL (ref 6.5–8.1)

## 2019-07-03 LAB — D-DIMER, QUANTITATIVE: D-Dimer, Quant: 1.05 ug/mL-FEU — ABNORMAL HIGH (ref 0.00–0.50)

## 2019-07-03 MED ORDER — MECLIZINE HCL 12.5 MG PO TABS
50.0000 mg | ORAL_TABLET | Freq: Once | ORAL | Status: AC
  Administered 2019-07-03: 50 mg via ORAL
  Filled 2019-07-03: qty 4

## 2019-07-03 MED ORDER — IOHEXOL 350 MG/ML SOLN
100.0000 mL | Freq: Once | INTRAVENOUS | Status: AC | PRN
  Administered 2019-07-03: 100 mL via INTRAVENOUS

## 2019-07-03 MED ORDER — SODIUM CHLORIDE 0.9 % IV BOLUS (SEPSIS)
1000.0000 mL | Freq: Once | INTRAVENOUS | Status: AC
  Administered 2019-07-03: 1000 mL via INTRAVENOUS

## 2019-07-03 MED ORDER — MECLIZINE HCL 25 MG PO TABS: 25.0000 mg | ORAL_TABLET | Freq: Three times a day (TID) | ORAL | 0 refills | Status: DC | PRN

## 2019-07-03 NOTE — ED Provider Notes (Signed)
[facility] EMERGENCY DEPARTMENT Provider Note   CSN: [id number] Arrival date & time: 07/03/19  1056     History   Chief Complaint Chief Complaint  Patient presents with   Dizziness    HPI Lance Cohen is a 48 y.o. male.     Patient is a 48 year old male past medical history of asthma, bipolar disorder, chronic knee pain, DVT, hypertension who presents the emergency department for dizziness.  Patient reports that this dizziness is not new and he has had it for several years since his IVC filter was removed.  Patient reports he feels like his been getting worse the last 2 days.  Patient reports he is frustrated that he has had multiple symptoms for the last several years and "nobody can help me with it".  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  He is somewhat of a poor historian and tells me he feels like he is spinning but then tells me he is not spinning and that he feels like he is going to blackout instead.  Reports that it has been coming and going and there is no worsening or relieving factors.  Reports that he did take medicine in the past for this which helped but he does not know the name of the medicine.  He reports that every once in a while he gets chest pain or shortness of breath but cannot be more specific about these details.  He denies any increased swelling in his leg but reports he has increased pain and is concerned that his DVT is getting worse.  He does not think that his Xarelto is working for him because he cut his finger one time and it only bled a little bit.  He reports having a headache and neck pain, this is also not new or worsened.     Past Medical History:  Diagnosis Date   Asthma    Bipolar 1 disorder ([place])    Chronic knee pain    DVT (deep venous thrombosis) ([place])    Hypertension    Insomnia    Suicide attempt ([place])    2010 Intentional overdose attempt with Depakote.     Patient Active Problem List   Diagnosis Date Noted   Rectal pain 06/29/2019   GERD (gastroesophageal reflux disease) 06/29/2019   Rectal bleeding 02/22/2019   Priapism 02/08/2019   Chest pain 10/19/2017   Asthma 10/19/2017   Hypertension 10/19/2017   Chronic knee pain 10/19/2017   Pain in limb 12/27/2016   Acute pulmonary embolism ([place]) 10/23/2016   Nausea and vomiting 10/23/2016   S/P IVC filter 10/23/2016   Tobacco abuse counseling 10/23/2016   DVT (deep venous thrombosis) ([place]) 10/18/2016   Cellulitis of foot 06/24/2011   Bipolar 1 disorder ([place]) 06/24/2011   Tobacco abuse 06/24/2011   Polycythemia 06/24/2011   Tinea pedis 06/24/2011   CARPAL TUNNEL SYNDROME, RIGHT 06/21/2010   CONTUSION, LEFT FOOT 06/05/2010   FOREIGN BODY, FOOT 04/17/2010    Past Surgical History:  Procedure Laterality Date   COLONOSCOPY WITH PROPOFOL N/A 03/18/2019   Procedure: COLONOSCOPY WITH PROPOFOL;  Surgeon: [name], MD;  [facility];  Service: Endoscopy;  Laterality: N/A;  2:30pm   FOOT SURGERY     had peice of wire removed   IVC FILTER REMOVAL N/A 02/24/2017   Procedure: IVC Filter Removal;  Surgeon: [name], MD;  Location: [place] CV LAB;  Service: Cardiovascular;  Laterality: N/A;   PERIPHERAL VASCULAR CATHETERIZATION Right 10/22/2016   Procedure: Thrombectomy, thrombolyisis;  Surgeon: [name], MD;  Location: [place] [facility];  Service: Cardiovascular;  Laterality: Right;   PERIPHERAL VASCULAR CATHETERIZATION N/A 10/22/2016   Procedure: IVC Filter Insertion;  Surgeon: [name], MD;  Location: [place] [facility];  Service: Cardiovascular;  Laterality: N/A;   POLYPECTOMY  03/18/2019   Procedure: POLYPECTOMY;  Surgeon: [name], MD;  Location: [facility];  Service: Endoscopy;;        Home Medications    Prior to Admission medications   Medication Sig Start Date End Date Taking? Authorizing Provider   amLODipine (NORVASC) 10 MG tablet Take 10 mg by mouth daily.   Yes [provider]  diclofenac sodium (VOLTAREN) 1 % GEL Apply 2 g topically 3 (three) times daily. 06/23/19  Yes Quentin Cornwall, [place] N, PA-C  doxepin (SINEQUAN) 10 MG capsule Take 10 mg by mouth daily.    Yes [provider]  hydrOXYzine (ATARAX/VISTARIL) 50 MG tablet Take 50 mg by mouth 3 (three) times daily as needed.   Yes [provider]  lisinopril (ZESTRIL) 10 MG tablet Take 10 mg by mouth daily.   Yes [provider]  methocarbamol (ROBAXIN) 500 MG tablet Take 1,000 mg by mouth 2 (two) times daily as needed.    Yes [provider]  omeprazole (PRILOSEC) 40 MG capsule Take 1 capsule (40 mg total) by mouth daily. 06/29/19  Yes Carlis Stable, NP  ondansetron (ZOFRAN ODT) 4 MG disintegrating tablet Take 1 tablet (4 mg total) by mouth every 8 (eight) hours as needed for nausea or vomiting. 05/07/19  Yes Fransico Meadow, PA-C  rivaroxaban (XARELTO) 20 MG TABS tablet Take 20 mg by mouth daily with supper.   Yes [provider]  albuterol (VENTOLIN HFA) 108 (90 Base) MCG/ACT inhaler Inhale 1-2 puffs into the lungs every 6 (six) hours as needed for wheezing or shortness of breath.    [provider]  meclizine (ANTIVERT) 25 MG tablet Take 1 tablet (25 mg total) by mouth 3 (three) times daily as needed for dizziness. 07/03/19   Alveria Apley, PA-C    Family History Family History  Problem Relation Age of Onset   Cancer Mother    Heart attack Father    Colon cancer Neg Hx     Social History Social History   Tobacco Use   Smoking status: Current Every Day Smoker    Packs/day: 0.25    Types: Cigarettes   Smokeless tobacco: Never Used  Substance Use Topics   Alcohol use: Yes    Comment: occasionally: on weekends (typically a pint on the weekend)   Drug use: Yes    Types: Marijuana    Comment: smokes for pain     Allergies   Bee venom, Penicillins, and Sulfa  antibiotics   Review of Systems Review of Systems  Constitutional: Negative for appetite change, chills and fever.  HENT: Negative for congestion.   Eyes: Negative for photophobia and visual disturbance.  Respiratory: Positive for shortness of breath (sometimes but not currently). Negative for cough.   Cardiovascular: Positive for chest pain (sometimes but not currently).  Gastrointestinal: Positive for abdominal pain (everyday, not new or changed), nausea and vomiting (everyday but this is not new). Negative for diarrhea.  Genitourinary: Negative for dysuria.  Musculoskeletal: Positive for arthralgias, back pain, gait problem (not new) and neck pain (not new).  Skin:  Negative for rash and wound.  Neurological: Positive for dizziness, syncope (sometimes but not recently), light-headedness and headaches. Negative for weakness.     Physical Exam Updated Vital Signs BP 121/79 (BP Location: Right Arm)    Pulse 62    Temp 98.2 F (36.8 C) (Oral)    Resp 14    Ht 5\' 11"  (1.803 m)    Wt 102.1 kg    SpO2 100%    BMI 31.38 kg/m   Physical Exam Vitals signs and nursing note reviewed.  Constitutional:      Appearance: Normal appearance.  HENT:     Head: Normocephalic and atraumatic.     Nose: Nose normal.     Mouth/Throat:     Mouth: Mucous membranes are moist.  Eyes:     Conjunctiva/sclera: Conjunctivae normal.     Pupils: Pupils are equal, round, and reactive to light.  Cardiovascular:     Rate and Rhythm: Normal rate and regular rhythm.     Pulses: Normal pulses.  Pulmonary:     Effort: Pulmonary effort is normal.  Abdominal:     General: Abdomen is flat. Bowel sounds are normal. There is no distension.     Tenderness: There is no abdominal tenderness. There is no guarding.  Skin:    General: Skin is dry.  Neurological:     General: No focal deficit present.     Mental Status: He is alert and oriented to person, place, and time.     Cranial Nerves: No cranial nerve deficit.       Sensory: No sensory deficit.     Motor: No weakness.     Coordination: Coordination normal.     Comments: No nystagmus  Psychiatric:        Mood and Affect: Mood normal.      ED Treatments / Results  Labs (all labs ordered are listed, but only abnormal results are displayed) Labs Reviewed  CBC WITH DIFFERENTIAL/PLATELET - Abnormal; Notable for the following components:      Result Value   MCV 102.3 (*)    MCH 34.7 (*)    Eosinophils Absolute 0.8 (*)    All other components within normal limits  COMPREHENSIVE METABOLIC PANEL - Abnormal; Notable for the following components:   Glucose, Bld 113 (*)    All other components within normal limits  D-DIMER, QUANTITATIVE (NOT AT [facility]) - Abnormal; Notable for the following components:   D-Dimer, Quant 1.05 (*)    All other components within normal limits    EKG EKG Interpretation  Date/Time:  Saturday July 03 2019 11:18:40 EDT Ventricular Rate:  62 PR Interval:  144 QRS Duration: 86 QT Interval:  424 QTC Calculation: 430 R Axis:   -33 Text Interpretation:  Normal sinus rhythm Left axis deviation Low voltage QRS Baseline wander Artifact When compared with ECG of 04/22/2019 No significant change was found Confirmed by Francine Graven [phone]) on 07/03/2019 2:10:55 PM   Radiology Ct Head Wo Contrast  Result Date: 07/03/2019 CLINICAL DATA:  Episodic vertigo. Dizziness. EXAM: CT HEAD WITHOUT CONTRAST TECHNIQUE: Contiguous axial images were obtained from the base of the skull through the vertex without intravenous contrast. COMPARISON:  CT scan dated 04/22/2019 FINDINGS: Brain: No evidence of acute infarction, hemorrhage, hydrocephalus, extra-axial collection or mass lesion/mass effect. Vascular: No hyperdense vessel or unexpected calcification. Skull: Normal. Negative for fracture or focal lesion. Sinuses/Orbits: No acute abnormalities. Other: None IMPRESSION: Normal exam. Electronically Signed   By: Lorriane Shire M.D.  On:  07/03/2019 17:11   Ct Angio Chest Pe W And/or Wo Contrast  Result Date: 07/03/2019 CLINICAL DATA:  Shortness of breath. Elevated D-dimer. History of pulmonary emboli. EXAM: CT ANGIOGRAPHY CHEST WITH CONTRAST TECHNIQUE: Multidetector CT imaging of the chest was performed using the standard protocol during bolus administration of intravenous contrast. Multiplanar CT image reconstructions and MIPs were obtained to evaluate the vascular anatomy. CONTRAST:  158mL OMNIPAQUE IOHEXOL 350 MG/ML SOLN COMPARISON:  CT scans dated 02/15/2019 and 10/18/2016 FINDINGS: Cardiovascular: Satisfactory opacification of the pulmonary arteries to the segmental level. No evidence of pulmonary embolism. Normal heart size. No pericardial effusion. Mediastinum/Nodes: No enlarged mediastinal, hilar, or axillary lymph nodes. Thyroid gland, trachea, and esophagus demonstrate no significant findings. Lungs/Pleura: There are no infiltrates or effusions. No pulmonary nodules. Minimal areas of atelectasis and scarring. Upper Abdomen: Normal. Musculoskeletal: No chest wall abnormality. No acute or significant osseous findings. Slight degenerative changes at the left sternoclavicular joint. Review of the MIP images confirms the above findings. IMPRESSION: No pulmonary emboli or other significant abnormalities. Electronically Signed   By: Lorriane Shire M.D.   On: 07/03/2019 17:17    Procedures Procedures (including critical care time)  Medications Ordered in ED Medications  meclizine (ANTIVERT) tablet 50 mg (50 mg Oral Given 07/03/19 1454)  sodium chloride 0.9 % bolus 1,000 mL (1,000 mLs Intravenous New Bag/Given 07/03/19 1454)  iohexol (OMNIPAQUE) 350 MG/ML injection 100 mL (100 mLs Intravenous Contrast Given 07/03/19 1640)     Initial Impression / Assessment and Plan / ED Course  I have reviewed the triage vital signs and the nursing notes.  Pertinent labs & imaging results that were available during my care of the patient were  reviewed by me and considered in my medical decision making (see chart for details).  Clinical Course as of Jul 02 1737  Sat Jul 03, 2019  1503 Patient has had acute on chronic dizziness and lightheadedness.  It is difficult to obtain history from this patient.  He had a normal neuro exam.  Given his history of DVT and with him being a poor historian I am going to also obtain some labs, head CT, d-dimer.   [KM]  1715 Patient improved with fluids and meclizine. He was initially orthostatic and he admits he does not drink water and he does drink liquor daily. He has no PE on CTA and remaining lab work is unremarkable. This has been a chronic issue for the patient but he is improved today after treatment. I advised him on making sure he stays very hydrated and avoids alcohol. I also will give him an rx for meclizine although I think his symptoms are more related to low volume rather than vertigo   [KM]    Clinical Course User Index [KM] Alveria Apley, PA-C         Final Clinical Impressions(s) / ED Diagnoses   Final diagnoses:  Dizziness    ED Discharge Orders         Ordered    meclizine (ANTIVERT) 25 MG tablet  3 times daily PRN     07/03/19 1737           Alveria Apley, PA-C 07/03/19 1738    Francine Graven, DO 07/06/19 1102

## 2019-07-03 NOTE — Discharge Instructions (Signed)
Thank you for allowing me to care for you today. Please return to the emergency department if you have new or worsening symptoms. Take your medications as instructed.  ° °

## 2019-07-03 NOTE — ED Notes (Signed)
Patient transported to X-ray 

## 2019-07-03 NOTE — ED Triage Notes (Signed)
Pt c/o dizziness and lightheadedness x 2-3 days. Pt states dizziness worsens with movement. No hx of vertigo. Pt also wants a known blood clot evaluated in RT hip.

## 2019-07-03 NOTE — ED Notes (Signed)
Pt refused to sign and did not speak while I reviewed his d/c instructions. Shook head no when asked if he had any questions and left with steady gait.

## 2019-07-05 ENCOUNTER — Telehealth (INDEPENDENT_AMBULATORY_CARE_PROVIDER_SITE_OTHER): Payer: Self-pay | Admitting: Vascular Surgery

## 2019-07-05 NOTE — Telephone Encounter (Signed)
The number in the message below belongs to an Associate Professor named Wylie Hail. The [phone] number listed in patients chart rang several times and then gave a beeping sound like it was disconnected. I have called the [phone] number back and left a message requesting someone contact us back if they are working with patient last name Sattler to please contact me back. AS, CMA

## 2019-07-05 NOTE — Telephone Encounter (Signed)
Please advise. AS, CMA 

## 2019-07-05 NOTE — Telephone Encounter (Signed)
Can we call and triage this patient a little further? He has a chronic (old) DVT which is not dangerous but generally takes time to run its course.  Let's find out about the pain in his groin as it may be totally unrelated to the DVT.  Is it constant ? Does it come and go? It is with movement? Is he having any swelling or worsening of swelling?

## 2019-07-06 ENCOUNTER — Other Ambulatory Visit (INDEPENDENT_AMBULATORY_CARE_PROVIDER_SITE_OTHER): Payer: Self-pay | Admitting: Nurse Practitioner

## 2019-07-06 NOTE — Telephone Encounter (Signed)
I advised patient of the below. He said to me several times that he is not a drug seeker and is not seeking medication from [place]. He states that he just wants to find out what the issue is that's causing all the pain.   He would like to come in for [place] and to see doc. Please advise what study you would like set up for him. AS< CMA

## 2019-07-06 NOTE — Telephone Encounter (Signed)
Called patient again today on [phone] number and reached him.  I asked patient where the pain was located and he stated the pain radiates from his right knee to his right groin into his chest. He says the pain is constant and is so bad he can barely stand up. He says he does not have any swelling.  He says he has gone to the ED several times for this issue and people are just ignoring his pain and telling him he is dehydrated. Would like to be seen ASAP for extreme pain. AS, CMA

## 2019-07-06 NOTE — Telephone Encounter (Signed)
Can you please call and schedule patient as advised below. AS, CMA

## 2019-07-06 NOTE — Telephone Encounter (Signed)
As a vascular surgery practice, we do not treat patients for issues just related to pain.  We can bring him in for a RLE DVT study.  The patient has an old DVT that we are aware of.  We can look to see if there is any other new DVT causing issues.  If not, we will not be providing pain medication as we generally only provide pain medication for patients that have recently had surgery.  Also based on his description of pain and symptoms it may not be related to a vascular cause at all and I would also recommend reaching out to his PCP.

## 2019-07-06 NOTE — Telephone Encounter (Signed)
He can come in for a RLE DVT

## 2019-07-15 ENCOUNTER — Other Ambulatory Visit (INDEPENDENT_AMBULATORY_CARE_PROVIDER_SITE_OTHER): Payer: Self-pay | Admitting: Nurse Practitioner

## 2019-07-15 DIAGNOSIS — I82511 Chronic embolism and thrombosis of right femoral vein: Secondary | ICD-10-CM

## 2019-07-15 DIAGNOSIS — G8929 Other chronic pain: Secondary | ICD-10-CM

## 2019-07-16 ENCOUNTER — Ambulatory Visit (INDEPENDENT_AMBULATORY_CARE_PROVIDER_SITE_OTHER): Payer: Medicaid Other

## 2019-07-16 ENCOUNTER — Encounter (INDEPENDENT_AMBULATORY_CARE_PROVIDER_SITE_OTHER): Payer: Self-pay | Admitting: Nurse Practitioner

## 2019-07-16 ENCOUNTER — Ambulatory Visit (INDEPENDENT_AMBULATORY_CARE_PROVIDER_SITE_OTHER): Payer: Medicaid Other | Admitting: Nurse Practitioner

## 2019-07-16 ENCOUNTER — Other Ambulatory Visit: Payer: Self-pay

## 2019-07-16 VITALS — BP 131/82 | HR 66 | Resp 12 | Ht 71.0 in | Wt 239.0 lb

## 2019-07-16 DIAGNOSIS — I82511 Chronic embolism and thrombosis of right femoral vein: Secondary | ICD-10-CM | POA: Diagnosis not present

## 2019-07-16 DIAGNOSIS — Z720 Tobacco use: Secondary | ICD-10-CM

## 2019-07-16 DIAGNOSIS — M79604 Pain in right leg: Secondary | ICD-10-CM

## 2019-07-16 DIAGNOSIS — K219 Gastro-esophageal reflux disease without esophagitis: Secondary | ICD-10-CM

## 2019-07-16 DIAGNOSIS — G8929 Other chronic pain: Secondary | ICD-10-CM

## 2019-07-16 NOTE — Progress Notes (Signed)
SUBJECTIVE:  Patient ID: Lance Cohen, male    DOB: [birth year], 48 y.o.   MRN: [id number] Chief Complaint  Patient presents with  . Follow-up    HPI  Lance Cohen is a 48 y.o. male presents today with concerns of pain in his right groin that travels up to his side it is sometimes in his abdomen.  The patient states that the pain is constant and it feels the same all the time however it is sometimes made worse when he is moving especially if he tries to lift something heavy.  Also certain positions may make the pain worse.  He denies any swelling in his bilateral lower extremities.  The patient states that this pain is been ongoing since about December 2019.  The patient has a history of DVT however it was initially diagnosed in March 2018.  Since this time the patient has been on Xarelto and is currently on Xarelto without issue.  Previous studies done prior to today have shown that the DVT remains however it is in a chronic state.  There has been no change in the DVT, in fact it has improved with each subsequent scan.  Today noninvasive studies show that the patient continues to have a chronic deep vein thrombosis within the right femoral vein.  There is partial compression seen throughout the femoral vein.  As well as recannulized flow seen throughout the femoral vein.  This is an improvement from previous studies which showed chronic thrombus within his popliteal and peroneal veins in addition to his femoral vein.  The left lower extremity has no evidence of common femoral vein obstruction. Past Medical History:  Diagnosis Date  . Asthma   . Bipolar 1 disorder ([place])   . Chronic knee pain   . DVT (deep venous thrombosis) ([place])   . Hypertension   . Insomnia   . Suicide attempt [facility])    2010 Intentional overdose attempt with Depakote.    Past Surgical History:  Procedure Laterality Date  . COLONOSCOPY WITH PROPOFOL N/A 03/18/2019   Procedure: COLONOSCOPY WITH PROPOFOL;  Surgeon:  [name], MD;  [facility];  Service: Endoscopy;  Laterality: N/A;  2:30pm  . FOOT SURGERY     had peice of wire removed  . IVC FILTER REMOVAL N/A 02/24/2017   Procedure: IVC Filter Removal;  Surgeon: [name], MD;  Location: [place] [facility];  Service: Cardiovascular;  Laterality: N/A;  . PERIPHERAL VASCULAR CATHETERIZATION Right 10/22/2016   Procedure: Thrombectomy, thrombolyisis;  Surgeon: [name], MD;  Location: [place] [facility];  Service: Cardiovascular;  Laterality: Right;  . PERIPHERAL VASCULAR CATHETERIZATION N/A 10/22/2016   Procedure: IVC Filter Insertion;  Surgeon: [name], MD;  Location: [place] [facility];  Service: Cardiovascular;  Laterality: N/A;  . POLYPECTOMY  03/18/2019   Procedure: POLYPECTOMY;  Surgeon: [name], MD;  [facility];  Service: Endoscopy;;    Social History   Socioeconomic History  . Marital status: Married    Spouse name: Not on file  . Number of children: Not on file  . Years of education: Not on file  . Highest education level: Not on file  Occupational History  . Not on file  Social Needs  . Financial resource strain: Not on file  . Food insecurity    Worry: Not on file    Inability: Not on file  . Transportation needs    Medical: Not on file  Non-medical: Not on file  Tobacco Use  . Smoking status: Current Every Day Smoker    Packs/day: 0.25    Types: Cigarettes  . Smokeless tobacco: Never Used  Substance and Sexual Activity  . Alcohol use: Yes    Comment: occasionally: on weekends (typically a pint on the weekend)  . Drug use: Yes    Types: Marijuana    Comment: smokes for pain  . Sexual activity: Yes  Lifestyle  . Physical activity    Days per week: Not on file    Minutes per session: Not on file  . Stress: Not on file  Relationships  . Social Herbalist on phone: Not on file    Gets together: Not on file    Attends religious service: Not on file     Active member of club or organization: Not on file    Attends meetings of clubs or organizations: Not on file    Relationship status: Not on file  . Intimate partner violence    Fear of current or ex partner: Not on file    Emotionally abused: Not on file    Physically abused: Not on file    Forced sexual activity: Not on file  Other Topics Concern  . Not on file  Social History Narrative  . Not on file    Family History  Problem Relation Age of Onset  . Cancer Mother   . Heart attack Father   . Colon cancer Neg Hx     Allergies  Allergen Reactions  . Bee Venom Swelling  . Penicillins Other (See Comments)    PT states that he had symptoms that felt like he was having a heart attack Has patient had a PCN reaction causing immediate rash, facial/tongue/throat swelling, SOB or lightheadedness with hypotension: No Has patient had a PCN reaction causing severe rash involving mucus membranes or skin necrosis: No Has patient had a PCN reaction that required hospitalization No Has patient had a PCN reaction occurring within the last 10 years: No If all of the above answers are "NO", then may proceed with Cephal  . Sulfa Antibiotics Itching     Review of Systems   Review of Systems: Negative Unless Checked Constitutional: [] Weight loss  [] Fever  [] Chills Cardiac: [] Chest pain   []  Atrial Fibrillation  [] Palpitations   [] Shortness of breath when laying flat   [] Shortness of breath with exertion. [] Shortness of breath at rest Vascular:  [] Pain in legs with walking   [] Pain in legs with standing [] Pain in legs when laying flat   [] Claudication    [] Pain in feet when laying flat    [x] History of DVT   [] Phlebitis   [] Swelling in legs   [] Varicose veins   [] Non-healing ulcers Pulmonary:   [] Uses home oxygen   [] Productive cough   [] Hemoptysis   [] Wheeze  [] COPD   [] Asthma Neurologic:  [] Dizziness   [] Seizures  [] Blackouts [] History of stroke   [] History of TIA  [] Aphasia   [] Temporary  Blindness   [] Weakness or numbness in arm   [x] Weakness or numbness in leg Musculoskeletal:   [] Joint swelling   [] Joint pain   [] Low back pain  []  History of Knee Replacement [] Arthritis [] back Surgeries  []  Spinal Stenosis    Hematologic:  [] Easy bruising  [] Easy bleeding   [] Hypercoagulable state   [] Anemic Gastrointestinal:  [] Diarrhea   [] Vomiting  [x] Gastroesophageal reflux/heartburn   [] Difficulty swallowing. [x] Abdominal pain Genitourinary:  [] Chronic kidney disease   []   Difficult urination  [] Anuric   [] Blood in urine [] Frequent urination  [] Burning with urination   [] Hematuria Skin:  [] Rashes   [] Ulcers [] Wounds Psychological:  [] History of anxiety   []  History of major depression  []  Memory Difficulties      OBJECTIVE:   Physical Exam  BP 131/82 (BP Location: Left Arm, Patient Position: Sitting, Cuff Size: Normal)   Pulse 66   Resp 12   Ht 5\' 11"  (1.803 m)   Wt 239 lb (108.4 kg)   BMI 33.33 kg/m   Gen: WD/WN, NAD Head: [place]/AT, No temporalis wasting.  Ear/Nose/Throat: Hearing grossly intact, nares w/o erythema or drainage Eyes: PER, EOMI, sclera nonicteric.  Neck: Supple, no masses.  No JVD.  Pulmonary:  Good air movement, no use of accessory muscles.  Cardiac: RRR Vascular:  Vessel Right Left  Radial Palpable Palpable  Dorsalis Pedis Palpable Palpable  Posterior Tibial Palpable Palpable   Gastrointestinal: soft, non-distended. No guarding/no peritoneal signs.  Musculoskeletal: M/S 5/5 throughout.  No deformity or atrophy.  Neurologic: Pain and light touch intact in extremities.  Symmetrical.  Speech is fluent. Motor exam as listed above. Psychiatric: Judgment intact, Mood & affect appropriate for pt's clinical situation. Dermatologic: No Venous rashes. No Ulcers Noted.  No changes consistent with cellulitis. Lymph : No Cervical lymphadenopathy, no lichenification or skin changes of chronic lymphedema.       ASSESSMENT AND PLAN:  1. Chronic deep vein thrombosis  (DVT) of femoral vein of right lower extremity (HCC) Based on patient's noninvasive studies today, there is no further propagation of the chronic DVT that the patient had diagnosed in 2018.  The patient still has evidence of chronic thrombus today however that would not explain his symptoms.  Also the patient symptoms recently started sometime in December, nearly a year after he had his initial DVT.  Based upon his description of symptoms, it is likely that it is musculoskeletal or even possibly neurogenic in nature.  The patient will continue to work with his primary care provider to look at alternative causes.  2. Gastroesophageal reflux disease, esophagitis presence not specified Continue PPI as already ordered, this medication has been reviewed and there are no changes at this time.  Avoidence of caffeine and alcohol  Moderate elevation of the head of the bed   3. Tobacco abuse Smoking cessation was discussed, 3-10 minutes spent on this topic specifically    Current Outpatient Medications on File Prior to Visit  Medication Sig Dispense Refill  . amLODipine (NORVASC) 10 MG tablet Take 10 mg by mouth daily.    . diclofenac sodium (VOLTAREN) 1 % GEL Apply 2 g topically 3 (three) times daily. 50 g 0  . doxepin (SINEQUAN) 10 MG capsule Take 10 mg by mouth daily.     . hydrOXYzine (ATARAX/VISTARIL) 50 MG tablet Take 50 mg by mouth 3 (three) times daily as needed.    . indomethacin (INDOCIN) 25 MG capsule Take 25 mg by mouth 2 (two) times daily with a meal.    . lisinopril (ZESTRIL) 10 MG tablet Take 10 mg by mouth daily.    . meclizine (ANTIVERT) 25 MG tablet Take 1 tablet (25 mg total) by mouth 3 (three) times daily as needed for dizziness. 30 tablet 0  . methocarbamol (ROBAXIN) 500 MG tablet Take 1,000 mg by mouth 2 (two) times daily as needed.     Marland Kitchen omeprazole (PRILOSEC) 40 MG capsule Take 1 capsule (40 mg total) by mouth daily. 30 capsule 2  .  ondansetron (ZOFRAN ODT) 4 MG disintegrating  tablet Take 1 tablet (4 mg total) by mouth every 8 (eight) hours as needed for nausea or vomiting. 20 tablet 0  . rivaroxaban (XARELTO) 20 MG TABS tablet Take 20 mg by mouth daily with supper.     No current facility-administered medications on file prior to visit.     There are no Patient Instructions on file for this visit. No follow-ups on file.   Kris Hartmann, NP  This note was completed with Sales executive.  Any errors are purely unintentional.

## 2019-09-29 ENCOUNTER — Ambulatory Visit: Payer: Medicaid Other | Admitting: Nurse Practitioner

## 2019-09-29 ENCOUNTER — Encounter: Payer: Self-pay | Admitting: Internal Medicine

## 2019-09-29 ENCOUNTER — Telehealth: Payer: Self-pay | Admitting: Internal Medicine

## 2019-09-29 NOTE — Telephone Encounter (Signed)
PATIENT WAS A NO SHOW AND LETTER SENT  °

## 2019-09-29 NOTE — Progress Notes (Deleted)
Referring Provider: Raiford Simmonds., PA-C Primary Care Physician:  Raiford Simmonds., PA-C Primary GI:  Dr. Gala Romney  No chief complaint on file.   HPI:   Lance Cohen is a 48 y.o. male who presents for follow-up on rectal pain.  The patient was last seen in our office 06/29/2019 for rectal pain, rectal bleeding, GERD.  Known history of hemorrhoids.  Previous fecal occult blood testing positive with noted hematochezia.  History of DVT noted to be occlusive or near occlusive in the right femoral region with deemed no role for intervention by vascular surgery and recommended continue aspirin versus anticoagulation.  Colonoscopy was completed 03/18/2019 which found a single 6 mm polyp in the splenic flexure, diverticulosis in the sigmoid and descending colon segments, nonbleeding internal hemorrhoids, otherwise normal.  Suspect attributable bleeding from hemorrhoids.  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  Recommended Benefiber daily for 3 weeks then twice daily thereafter and repeat colonoscopy in 7 years (2027).  At his last visit he noted some lower abdominal discomfort that he feels is related to his femoral clot.  He was wondering if he has an ulcer.  Has nausea and vomiting with certain triggers which improved after eliminating red meat.  He is not sure if he is having rectal pain or issues related to his blood clot.  He is doing much better currently.  Intermittent rectal bleeding but not nearly as bad as previously.  Regular heartburn/reflux only on Tums/Rolaids which does not help.  Esophageal burning, bitter taste with flare.  Milk is helped previously.  No other overt GI complaints.  Recommended notify us of any worsening rectal pain or rectal bleeding and can consider topical creams versus hemorrhoid banding.  Start Prilosec 40 mg daily, Tums as needed for flares.  Follow-up in 3 months.  Today he states   Past Medical History:  Diagnosis Date  . Asthma   .  Bipolar 1 disorder ([place])   . Chronic knee pain   . DVT (deep venous thrombosis) ([place])   . Hypertension   . Insomnia   . Suicide attempt [facility])    2010 Intentional overdose attempt with Depakote.    Past Surgical History:  Procedure Laterality Date  . COLONOSCOPY WITH PROPOFOL N/A 03/18/2019   Procedure: COLONOSCOPY WITH PROPOFOL;  Surgeon: [name], MD;  [facility];  Service: Endoscopy;  Laterality: N/A;  2:30pm  . FOOT SURGERY     had peice of wire removed  . IVC FILTER REMOVAL N/A 02/24/2017   Procedure: IVC Filter Removal;  Surgeon: [name], MD;  Location: [place] [facility];  Service: Cardiovascular;  Laterality: N/A;  . PERIPHERAL VASCULAR CATHETERIZATION Right 10/22/2016   Procedure: Thrombectomy, thrombolyisis;  Surgeon: [name], MD;  Location: [place] [facility];  Service: Cardiovascular;  Laterality: Right;  . PERIPHERAL VASCULAR CATHETERIZATION N/A 10/22/2016   Procedure: IVC Filter Insertion;  Surgeon: [name], MD;  Location: [place] [facility];  Service: Cardiovascular;  Laterality: N/A;  . POLYPECTOMY  03/18/2019   Procedure: POLYPECTOMY;  Surgeon: [name], MD;  [facility];  Service: Endoscopy;;    Current Outpatient Medications  Medication Sig Dispense Refill  . amLODipine (NORVASC) 10 MG tablet Take 10 mg by mouth daily.    . diclofenac sodium (VOLTAREN) 1 % GEL Apply 2 g topically 3 (three) times daily. 50 g 0  . doxepin (SINEQUAN) 10 MG capsule Take 10 mg  by mouth daily.     . hydrOXYzine (ATARAX/VISTARIL) 50 MG tablet Take 50 mg by mouth 3 (three) times daily as needed.    . indomethacin (INDOCIN) 25 MG capsule Take 25 mg by mouth 2 (two) times daily with a meal.    . lisinopril (ZESTRIL) 10 MG tablet Take 10 mg by mouth daily.    . meclizine (ANTIVERT) 25 MG tablet Take 1 tablet (25 mg total) by mouth 3 (three) times daily as needed for dizziness. 30 tablet 0  . methocarbamol (ROBAXIN) 500 MG tablet  Take 1,000 mg by mouth 2 (two) times daily as needed.     Marland Kitchen omeprazole (PRILOSEC) 40 MG capsule Take 1 capsule (40 mg total) by mouth daily. 30 capsule 2  . ondansetron (ZOFRAN ODT) 4 MG disintegrating tablet Take 1 tablet (4 mg total) by mouth every 8 (eight) hours as needed for nausea or vomiting. 20 tablet 0  . rivaroxaban (XARELTO) 20 MG TABS tablet Take 20 mg by mouth daily with supper.     No current facility-administered medications for this visit.     Allergies as of 09/29/2019 - Review Complete 07/16/2019  Allergen Reaction Noted  . Bee venom Swelling 12/08/2013  . Penicillins Other (See Comments)   . Sulfa antibiotics Itching 11/13/2013    Family History  Problem Relation Age of Onset  . Cancer Mother   . Heart attack Father   . Colon cancer Neg Hx     Social History   Socioeconomic History  . Marital status: Married    Spouse name: Not on file  . Number of children: Not on file  . Years of education: Not on file  . Highest education level: Not on file  Occupational History  . Not on file  Social Needs  . Financial resource strain: Not on file  . Food insecurity    Worry: Not on file    Inability: Not on file  . Transportation needs    Medical: Not on file    Non-medical: Not on file  Tobacco Use  . Smoking status: Current Every Day Smoker    Packs/day: 0.25    Types: Cigarettes  . Smokeless tobacco: Never Used  Substance and Sexual Activity  . Alcohol use: Yes    Comment: occasionally: on weekends (typically a pint on the weekend)  . Drug use: Yes    Types: Marijuana    Comment: smokes for pain  . Sexual activity: Yes  Lifestyle  . Physical activity    Days per week: Not on file    Minutes per session: Not on file  . Stress: Not on file  Relationships  . Social Herbalist on phone: Not on file    Gets together: Not on file    Attends religious service: Not on file    Active member of club or organization: Not on file    Attends  meetings of clubs or organizations: Not on file    Relationship status: Not on file  Other Topics Concern  . Not on file  Social History Narrative  . Not on file    Review of Systems: General: Negative for anorexia, weight loss, fever, chills, fatigue, weakness. Eyes: Negative for vision changes.  ENT: Negative for hoarseness, difficulty swallowing , nasal congestion. CV: Negative for chest pain, angina, palpitations, dyspnea on exertion, peripheral edema.  Respiratory: Negative for dyspnea at rest, dyspnea on exertion, cough, sputum, wheezing.  GI: See history of present illness. GU:  Negative for dysuria, hematuria, urinary incontinence, urinary frequency, nocturnal urination.  MS: Negative for joint pain, low back pain.  Derm: Negative for rash or itching.  Neuro: Negative for weakness, abnormal sensation, seizure, frequent headaches, memory loss, confusion.  Psych: Negative for anxiety, depression, suicidal ideation, hallucinations.  Endo: Negative for unusual weight change.  Heme: Negative for bruising or bleeding. Allergy: Negative for rash or hives.   Physical Exam: There were no vitals taken for this visit. General:   Alert and oriented. Pleasant and cooperative. Well-nourished and well-developed.  Head:  Normocephalic and atraumatic. Eyes:  Without icterus, sclera clear and conjunctiva pink.  Ears:  Normal auditory acuity. Mouth:  No deformity or lesions, oral mucosa pink.  Throat/Neck:  Supple, without mass or thyromegaly. Cardiovascular:  S1, S2 present without murmurs appreciated. Normal pulses noted. Extremities without clubbing or edema. Respiratory:  Clear to auscultation bilaterally. No wheezes, rales, or rhonchi. No distress.  Gastrointestinal:  +BS, soft, non-tender and non-distended. No HSM noted. No guarding or rebound. No masses appreciated.  Rectal:  Deferred  Musculoskalatal:  Symmetrical without gross deformities. Normal posture. Skin:  Intact without  significant lesions or rashes. Neurologic:  Alert and oriented x4;  grossly normal neurologically. Psych:  Alert and cooperative. Normal mood and affect. Heme/Lymph/Immune: No significant cervical adenopathy. No excessive bruising noted.    09/29/2019 12:53 PM   Disclaimer: This note was dictated with voice recognition software. Similar sounding words can inadvertently be transcribed and may not be corrected upon review.

## 2019-12-14 ENCOUNTER — Telehealth (INDEPENDENT_AMBULATORY_CARE_PROVIDER_SITE_OTHER): Payer: Self-pay | Admitting: Vascular Surgery

## 2019-12-14 NOTE — Telephone Encounter (Signed)
PATIENT WAS ADVISED AND STATED HE FIGURED IT WAS NOTHING THAT COULD BE DONE. PCP ADVISED PATIENT THEY WOULD BE SENDING OVER REFERRAL.

## 2019-12-14 NOTE — Telephone Encounter (Signed)
Patient was last seen on 07/16/2019. Please advise if appt needed.

## 2019-12-14 NOTE — Telephone Encounter (Signed)
The patient has evidence of an old blood clot which was seen on his ultrasound on 07/16/2019.  Once the blood clot becomes an old blood clot there is nothing that we can do other than keep him on blood thinners.  It is not dangerous.  Also regarding our previous conversation, this pain may be more related to his back.  If the patient likes we can do an another ultrasound but if there aren't any different results there is nothing we'll be able to do or prescribe other than what he is already on.  Since he saw me last, he can see Dew.

## 2020-01-06 ENCOUNTER — Emergency Department (HOSPITAL_COMMUNITY)
Admission: EM | Admit: 2020-01-06 | Discharge: 2020-01-06 | Disposition: A | Discharge status: Home / Self Care | Payer: Medicaid Other | Attending: Emergency Medicine | Admitting: Emergency Medicine

## 2020-01-06 ENCOUNTER — Other Ambulatory Visit: Payer: Self-pay

## 2020-01-06 ENCOUNTER — Encounter (HOSPITAL_COMMUNITY): Payer: Self-pay | Admitting: Emergency Medicine

## 2020-01-06 DIAGNOSIS — M25511 Pain in right shoulder: Secondary | ICD-10-CM | POA: Insufficient documentation

## 2020-01-06 DIAGNOSIS — I10 Essential (primary) hypertension: Secondary | ICD-10-CM | POA: Diagnosis not present

## 2020-01-06 DIAGNOSIS — J45909 Unspecified asthma, uncomplicated: Secondary | ICD-10-CM | POA: Insufficient documentation

## 2020-01-06 DIAGNOSIS — Z79899 Other long term (current) drug therapy: Secondary | ICD-10-CM | POA: Diagnosis not present

## 2020-01-06 DIAGNOSIS — F17210 Nicotine dependence, cigarettes, uncomplicated: Secondary | ICD-10-CM | POA: Diagnosis not present

## 2020-01-06 DIAGNOSIS — Z7901 Long term (current) use of anticoagulants: Secondary | ICD-10-CM | POA: Diagnosis not present

## 2020-01-06 DIAGNOSIS — G8929 Other chronic pain: Secondary | ICD-10-CM | POA: Diagnosis not present

## 2020-01-06 MED ORDER — DICLOFENAC SODIUM 1 % EX GEL: 4.0000 g | Freq: Four times a day (QID) | CUTANEOUS | 0 refills | Status: DC

## 2020-01-06 NOTE — ED Provider Notes (Signed)
[facility] Provider Note   CSN: [id number] Arrival date & time: 01/06/20  1056  History Chief Complaint  Patient presents with  . Arm Pain    Lance Cohen is a 49 y.o. male with history of recurrent DVT on Xarelto, HTN, bipolar d/o who presents with right shoulder pain.  Patient states that he actually has multiple areas of pain but his right shoulder is bothering him the most today.  Symptoms have been ongoing for several months.  Patient states that the reason for his visit today is "#1 I'm tired of being in pain" and "#2 my right shoulder and entire right side hurts". Then he states "I dunno, maybe it's just old age".  Patient states that he has been going to multiple doctors but no one will help him with his pain.  He states that he was here several months ago for right shoulder pain and was prescribed Voltaren gel which helped him more than the muscle relaxers did.  He states the shoulder with pop and crack. He has difficulty raising the shoulder. He has not been to orthopedics.  He also is having some numbness and tingling in both of his hands which he attributes to carpal tunnel.  He is requesting a brace for this.  HPI     Past Medical History:  Diagnosis Date  . Asthma   . Bipolar 1 disorder ([place])   . Chronic knee pain   . DVT (deep venous thrombosis) ([place])   . Hypertension   . Insomnia   . Suicide attempt [facility])    2010 Intentional overdose attempt with Depakote.    Patient Active Problem List   Diagnosis Date Noted  . Rectal pain 06/29/2019  . GERD (gastroesophageal reflux disease) 06/29/2019  . Rectal bleeding 02/22/2019  . Priapism 02/08/2019  . Chest pain 10/19/2017  . Asthma 10/19/2017  . Hypertension 10/19/2017  . Chronic knee pain 10/19/2017  . Pain in limb 12/27/2016  . Acute pulmonary embolism ([place]) 10/23/2016  . Nausea and vomiting 10/23/2016  . S/P IVC filter 10/23/2016  . Tobacco abuse counseling 10/23/2016  . DVT (deep venous  thrombosis) ([place]) 10/18/2016  . Cellulitis of foot 06/24/2011  . Bipolar 1 disorder ([place]) 06/24/2011  . Tobacco abuse 06/24/2011  . Polycythemia 06/24/2011  . Tinea pedis 06/24/2011  . CARPAL TUNNEL SYNDROME, RIGHT 06/21/2010  . CONTUSION, LEFT FOOT 06/05/2010  . FOREIGN BODY, FOOT 04/17/2010    Past Surgical History:  Procedure Laterality Date  . COLONOSCOPY WITH PROPOFOL N/A 03/18/2019   Procedure: COLONOSCOPY WITH PROPOFOL;  Surgeon: [name], MD;  [facility];  Service: Endoscopy;  Laterality: N/A;  2:30pm  . FOOT SURGERY     had peice of wire removed  . IVC FILTER REMOVAL N/A 02/24/2017   Procedure: IVC Filter Removal;  Surgeon: [name], MD;  Location: [place] CV [facility];  Service: Cardiovascular;  Laterality: N/A;  . PERIPHERAL VASCULAR CATHETERIZATION Right 10/22/2016   Procedure: Thrombectomy, thrombolyisis;  Surgeon: [name], MD;  Location: [place] [facility];  Service: Cardiovascular;  Laterality: Right;  . PERIPHERAL VASCULAR CATHETERIZATION N/A 10/22/2016   Procedure: IVC Filter Insertion;  Surgeon: [name], MD;  Location: [place] [facility];  Service: Cardiovascular;  Laterality: N/A;  . POLYPECTOMY  03/18/2019   Procedure: POLYPECTOMY;  Surgeon: [name], MD;  [facility];  Service: Endoscopy;;       Family History  Problem Relation Age of  Onset  . Cancer Mother   . Heart attack Father   . Colon cancer Neg Hx     Social History   Tobacco Use  . Smoking status: Current Every Day Smoker    Packs/day: 0.25    Types: Cigarettes  . Smokeless tobacco: Never Used  Substance Use Topics  . Alcohol use: Yes    Comment: occasionally: on weekends (typically a pint on the weekend)  . Drug use: Yes    Types: Marijuana    Comment: smokes for pain    Home Medications Prior to Admission medications   Medication Sig Start Date End Date Taking? Authorizing Provider  amLODipine (NORVASC) 10 MG tablet Take 10  mg by mouth daily.    [provider]  diclofenac sodium (VOLTAREN) 1 % GEL Apply 2 g topically 3 (three) times daily. 06/23/19   Robinson, [place] N, PA-C  doxepin (SINEQUAN) 10 MG capsule Take 10 mg by mouth daily.     [provider]  hydrOXYzine (ATARAX/VISTARIL) 50 MG tablet Take 50 mg by mouth 3 (three) times daily as needed.    [provider]  indomethacin (INDOCIN) 25 MG capsule Take 25 mg by mouth 2 (two) times daily with a meal.    [provider]  lisinopril (ZESTRIL) 10 MG tablet Take 10 mg by mouth daily.    [provider]  meclizine (ANTIVERT) 25 MG tablet Take 1 tablet (25 mg total) by mouth 3 (three) times daily as needed for dizziness. 07/03/19   Alveria Apley, PA-C  methocarbamol (ROBAXIN) 500 MG tablet Take 1,000 mg by mouth 2 (two) times daily as needed.     [provider]  omeprazole (PRILOSEC) 40 MG capsule Take 1 capsule (40 mg total) by mouth daily. 06/29/19   Carlis Stable, NP  ondansetron (ZOFRAN ODT) 4 MG disintegrating tablet Take 1 tablet (4 mg total) by mouth every 8 (eight) hours as needed for nausea or vomiting. 05/07/19   Fransico Meadow, PA-C  rivaroxaban (XARELTO) 20 MG TABS tablet Take 20 mg by mouth daily with supper.    [provider]    Allergies    Bee venom, Penicillins, and Sulfa antibiotics  Review of Systems   Review of Systems  Musculoskeletal: Positive for arthralgias, back pain, myalgias and neck pain.  Skin: Negative for wound.  Neurological: Positive for numbness. Negative for weakness.    Physical Exam Updated Vital Signs Ht 5\' 7"  (1.702 m)   Wt 95.3 kg   BMI 32.89 kg/m   Physical Exam Vitals and nursing note reviewed.  Constitutional:      General: He is not in acute distress.    Appearance: Normal appearance. He is well-developed. He is not ill-appearing.     Comments: Chronically ill-appearing male in no acute distress.  Somewhat agitated  HENT:     Head:  Normocephalic and atraumatic.  Eyes:     General: No scleral icterus.       Right eye: No discharge.        Left eye: No discharge.     Conjunctiva/sclera: Conjunctivae normal.     Pupils: Pupils are equal, round, and reactive to light.  Neck:     Comments: Lower C-spine tenderness Cardiovascular:     Rate and Rhythm: Normal rate and regular rhythm.  Pulmonary:     Effort: Pulmonary effort is normal. No respiratory distress.     Breath sounds: Normal breath sounds.  Abdominal:     General:  There is no distension.     Palpations: Abdomen is soft.  Musculoskeletal:     Cervical back: Normal range of motion.     Comments: Right shoulder: No obvious swelling, deformity, or warmth. Tenderness to palpation over the posterior, lateral, and anterior shoulder. Tenderness over the biceps tendon. ROM decreased. 5/5 grip strength. Cap refill <2. N/V intact.   Skin:    General: Skin is warm and dry.  Neurological:     Mental Status: He is alert and oriented to person, place, and time.  Psychiatric:        Behavior: Behavior normal.     ED Results / Procedures / Treatments   Labs (all labs ordered are listed, but only abnormal results are displayed) Labs Reviewed - No data to display  EKG None  Radiology No results found.  Procedures Procedures (including critical care time)  Medications Ordered in ED Medications - No data to display  ED Course  I have reviewed the triage vital signs and the nursing notes.  Pertinent labs & imaging results that were available during my care of the patient were reviewed by me and considered in my medical decision making (see chart for details).  49 year old male with chronic pain.  Likely overuse syndrome such as tendinitis or bursitis of the shoulder.  Also question some radicular symptoms with numbness in the hands although patient states that this is from carpal tunnel.  He has not had any acute trauma therefore do not feel imaging is  indicated today.  He was given a sling, wrist brace, refill of the Voltaren gel.  He is instructed to follow-up with sports medicine  MDM Rules/Calculators/A&P                       Final Clinical Impression(s) / ED Diagnoses Final diagnoses:  Chronic right shoulder pain    Rx / DC Orders ED Discharge Orders    None       Recardo Evangelist, PA-C 01/06/20 1449    Nat Christen, MD 01/10/20 [phone]

## 2020-01-06 NOTE — ED Triage Notes (Signed)
Pt c/o of right arm pain x 1 month.   Pt also states " I have pain all over and I cant get anyone to do shit for me"

## 2020-01-06 NOTE — Discharge Instructions (Addendum)
Use sling for shoulder support Apply voltaren gel 4 times daily for pain Please follow up with [facility]

## 2020-01-18 ENCOUNTER — Ambulatory Visit: Payer: Self-pay

## 2020-01-18 ENCOUNTER — Other Ambulatory Visit: Payer: Self-pay

## 2020-01-18 ENCOUNTER — Ambulatory Visit (HOSPITAL_BASED_OUTPATIENT_CLINIC_OR_DEPARTMENT_OTHER)
Admission: RE | Admit: 2020-01-18 | Discharge: 2020-01-18 | Disposition: A | Discharge status: Home / Self Care | Payer: Medicaid Other | Source: Ambulatory Visit | Attending: Family Medicine | Admitting: Family Medicine

## 2020-01-18 ENCOUNTER — Ambulatory Visit (INDEPENDENT_AMBULATORY_CARE_PROVIDER_SITE_OTHER): Payer: Medicaid Other | Admitting: Family Medicine

## 2020-01-18 ENCOUNTER — Encounter: Payer: Self-pay | Admitting: Family Medicine

## 2020-01-18 VITALS — BP 158/81 | HR 81 | Ht 71.0 in | Wt 230.0 lb

## 2020-01-18 DIAGNOSIS — M7541 Impingement syndrome of right shoulder: Secondary | ICD-10-CM | POA: Insufficient documentation

## 2020-01-18 DIAGNOSIS — G8929 Other chronic pain: Secondary | ICD-10-CM

## 2020-01-18 DIAGNOSIS — G5603 Carpal tunnel syndrome, bilateral upper limbs: Secondary | ICD-10-CM | POA: Diagnosis not present

## 2020-01-18 MED ORDER — PREDNISONE 5 MG PO TABS: ORAL_TABLET | ORAL | 0 refills | Status: DC

## 2020-01-18 NOTE — Assessment & Plan Note (Signed)
Acute on chronic in nature.  Staying the same currently.  Seems more impingement related as opposed to capsulitis.  Could be referred from the cervical spine. -Prednisone. -Counseled on home exercise therapy and supportive care. -X-ray of the shoulder and cervical spine. -Could consider muscle relaxer.  Could consider physical therapy or injection.

## 2020-01-18 NOTE — Assessment & Plan Note (Signed)
Symptoms bilaterally.  No signs of atrophy.  Symptoms are intermittent in nature.  Could be radicular in nature. -Prednisone. -Has braces to use. -Counseled on home exercise therapy and supportive care. -Could consider injections.

## 2020-01-18 NOTE — Patient Instructions (Signed)
Nice to meet you Please alternate heat and ice on the shoulder  Please try the exercises  I will call with the results from today   Please send me a message in MyChart with any questions or updates.  Please see me back in 4 weeks or sooner if needed.   --Dr. Raeford Razor

## 2020-01-18 NOTE — Progress Notes (Signed)
SHAIN Cohen - 49 y.o. male MRN [id number]  Date of birth: [birth year]  SUBJECTIVE:  Including CC & ROS.  Chief Complaint  Patient presents with  . Shoulder Pain    right shoulder    Lance Cohen is a 49 y.o. male that is presenting with acute on chronic left shoulder pain.  The pain is been ongoing for a few months.  He denies any specific inciting event.  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  He has not had any history of similar pain or surgery.  Pain seems to be constant through the course of the day.  He is also having altered sensation in the left and right hand.  He was told he had carpal tunnel in the past.  No history of surgery.  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   Review of Systems See HPI   HISTORY: Past Medical, Surgical, Social, and Family History Reviewed & Updated per EMR.   Pertinent Historical Findings include:  Past Medical History:  Diagnosis Date  . Asthma   . Bipolar 1 disorder ([place])   . Chronic knee pain   . DVT (deep venous thrombosis) ([place])   . Hypertension   . Insomnia   . Suicide attempt [facility])    2010 Intentional overdose attempt with Depakote.    Past Surgical History:  Procedure Laterality Date  . COLONOSCOPY WITH PROPOFOL N/A 03/18/2019   Procedure: COLONOSCOPY WITH PROPOFOL;  Surgeon: Lance Dolin, MD;  [facility];  Service: Endoscopy;  Laterality: N/A;  2:30pm  . FOOT SURGERY     had peice of wire removed  . IVC FILTER REMOVAL N/A 02/24/2017   Procedure: IVC Filter Removal;  Surgeon: Lance Huxley, MD;  Location: [place] CV [facility];  Service: Cardiovascular;  Laterality: N/A;  . PERIPHERAL VASCULAR CATHETERIZATION Right 10/22/2016   Procedure: Thrombectomy, thrombolyisis;  Surgeon: Lance Cabal, MD;  Location: [place] [facility];  Service: Cardiovascular;  Laterality: Right;  . PERIPHERAL VASCULAR CATHETERIZATION N/A 10/22/2016   Procedure: IVC Filter Insertion;  Surgeon: Lance Cabal, MD;   Location: [place] [facility];  Service: Cardiovascular;  Laterality: N/A;  . POLYPECTOMY  03/18/2019   Procedure: POLYPECTOMY;  Surgeon: Lance Dolin, MD;  [facility];  Service: Endoscopy;;    Family History  Problem Relation Age of Onset  . Cancer Mother   . Heart attack Father   . Colon cancer Neg Hx     Social History   Socioeconomic History  . Marital status: Married    Spouse name: Not on file  . Number of children: Not on file  . Years of education: Not on file  . Highest education level: Not on file  Occupational History  . Not on file  Tobacco Use  . Smoking status: Current Every Day Smoker    Packs/day: 0.25    Types: Cigarettes  . Smokeless tobacco: Never Used  Substance and Sexual Activity  . Alcohol use: Yes    Comment: occasionally: on weekends (typically a pint on the weekend)  . Drug use: Yes    Types: Marijuana    Comment: smokes for pain  . Sexual activity: Yes  Other Topics Concern  . Not on file  Social History Narrative  . Not on file   Social Determinants of Health   Financial Resource Strain:   . Difficulty of Paying Living Expenses:   Food Insecurity:   . Worried  About Running Out of Food in the Last Year:   . [place] in the Last Year:   Transportation Needs:   . Lack of Transportation (Medical):   Marland Kitchen Lack of Transportation (Non-Medical):   Physical Activity:   . Days of Exercise per Week:   . Minutes of Exercise per Session:   Stress:   . Feeling of Stress :   Social Connections:   . Frequency of Communication with Friends and Family:   . Frequency of Social Gatherings with Friends and Family:   . Attends Religious Services:   . Active Member of Clubs or Organizations:   . Attends Archivist Meetings:   Marland Kitchen Marital Status:   Intimate Partner Violence:   . Fear of Current or Ex-Partner:   . Emotionally Abused:   Marland Kitchen Physically Abused:   . Sexually Abused:      PHYSICAL EXAM:  VS: BP (!) 158/81    Pulse 81   Ht 5\' 11"  (1.803 m)   Wt 230 lb (104.3 kg)   BMI 32.08 kg/m  Physical Exam Gen: NAD, alert, cooperative with exam, well-appearing MSK:  Right shoulder: Limited external rotation. Limited abduction and flexion. Normal strength resistance. Positive empty can test. Pain with O'Brien's test. Right and left hand: No signs of atrophy. Normal strength resistance with thumb extension and flexion. Grip strength. Normal strength resistance with adduction and abduction of the fingers.  Neurovascularly intact  Limited ultrasound: Right shoulder:  Normal appearing BT  Normal appearing subscapularis  Mild calcific change of the supraspinatus and no increase doppler activity. Some impingement on dynamic testing.  No effusion in posterior GH joint.   Summary: Findings would suggest impingement syndrome  Ultrasound and interpretation by Lance Coots, MD    ASSESSMENT & PLAN:   CARPAL TUNNEL SYNDROME, RIGHT Symptoms bilaterally.  No signs of atrophy.  Symptoms are intermittent in nature.  Could be radicular in nature. -Prednisone. -Has braces to use. -Counseled on home exercise therapy and supportive care. -Could consider injections.  Impingement syndrome of right shoulder Acute on chronic in nature.  Staying the same currently.  Seems more impingement related as opposed to capsulitis.  Could be referred from the cervical spine. -Prednisone. -Counseled on home exercise therapy and supportive care. -X-ray of the shoulder and cervical spine. -Could consider muscle relaxer.  Could consider physical therapy or injection.

## 2020-01-19 ENCOUNTER — Telehealth: Payer: Self-pay | Admitting: Family Medicine

## 2020-01-19 NOTE — Telephone Encounter (Signed)
Spoke with patient about results.   Rosemarie Ax, MD [facility] 01/19/2020, 8:27 AM

## 2020-02-15 ENCOUNTER — Ambulatory Visit: Payer: Medicaid Other | Admitting: Family Medicine

## 2020-02-18 ENCOUNTER — Encounter: Payer: Self-pay | Admitting: Family Medicine

## 2020-02-18 ENCOUNTER — Ambulatory Visit: Payer: Medicaid Other | Admitting: Family Medicine

## 2020-02-18 ENCOUNTER — Other Ambulatory Visit: Payer: Self-pay

## 2020-02-18 VITALS — BP 120/73 | HR 58 | Ht 71.0 in | Wt 208.0 lb

## 2020-02-18 DIAGNOSIS — M7541 Impingement syndrome of right shoulder: Secondary | ICD-10-CM

## 2020-02-18 DIAGNOSIS — M5412 Radiculopathy, cervical region: Secondary | ICD-10-CM | POA: Diagnosis not present

## 2020-02-18 MED ORDER — GABAPENTIN 300 MG PO CAPS: 300.0000 mg | ORAL_CAPSULE | Freq: Three times a day (TID) | ORAL | 1 refills | Status: DC | PRN

## 2020-02-18 NOTE — Patient Instructions (Signed)
Good to see you Please try the exercises  Please try the pennsaid  Please start the gabapentin at night. It may make you sleepy. You can increase to 2 pills daily if no improvement with one pill. You may do this after 3-4 days. You may increase to 3 pills as you tolerate  Physical therapy will give you a call.   Please send me a message in MyChart with any questions or updates.  Please see me back in 4 weeks.   --Dr. Raeford Razor

## 2020-02-18 NOTE — Assessment & Plan Note (Signed)
Pain is still ongoing.  He feels weak.  It may be more of a component of radicular symptoms as opposed to just being at the shoulder.  He does have significant degenerative changes of the [facility] joint. -Referral to physical therapy. -Counseled on home exercise therapy and supportive care. -Provided Pennsaid samples.

## 2020-02-18 NOTE — Assessment & Plan Note (Signed)
Has feelings of weakness in his arm itself.  May be more radicular in nature as opposed to being his shoulder. -Gabapentin. -Counseled on home exercise therapy and supportive care. -Referral to physical therapy. -Has a pellet in his leg so may need to consider imaging that before ordering an MRI if needed.

## 2020-02-18 NOTE — Progress Notes (Signed)
Lance Cohen - 49 y.o. male MRN [id number]  Date of birth: 1970-12-28  SUBJECTIVE:  Including CC & ROS.  Chief Complaint  Patient presents with  . Follow-up    follow up for right shoulder    Lance Cohen is a 49 y.o. male that is following up for his right shoulder pain and also presenting with right arm pain.  He was unable to take the prednisone since the directions were not provided to him.  He still experiences severe pain in the shoulder as well as down from the neck to the radial aspect of his arm.  He dropped a generator that he was carrying because his arm went limp.   Review of Systems See HPI   HISTORY: Past Medical, Surgical, Social, and Family History Reviewed & Updated per EMR.   Pertinent Historical Findings include:  Past Medical History:  Diagnosis Date  . Asthma   . Bipolar 1 disorder ([place])   . Chronic knee pain   . DVT (deep venous thrombosis) ([place])   . Hypertension   . Insomnia   . Suicide attempt [facility])    2010 Intentional overdose attempt with Depakote.    Past Surgical History:  Procedure Laterality Date  . COLONOSCOPY WITH PROPOFOL N/A 03/18/2019   Procedure: COLONOSCOPY WITH PROPOFOL;  Surgeon: [name], MD;  [facility];  Service: Endoscopy;  Laterality: N/A;  2:30pm  . FOOT SURGERY     had peice of wire removed  . IVC FILTER REMOVAL N/A 02/24/2017   Procedure: IVC Filter Removal;  Surgeon: [name], MD;  Location: [place] [facility];  Service: Cardiovascular;  Laterality: N/A;  . PERIPHERAL VASCULAR CATHETERIZATION Right 10/22/2016   Procedure: Thrombectomy, thrombolyisis;  Surgeon: [name], MD;  Location: [place] CV [facility];  Service: Cardiovascular;  Laterality: Right;  . PERIPHERAL VASCULAR CATHETERIZATION N/A 10/22/2016   Procedure: IVC Filter Insertion;  Surgeon: [name], MD;  Location: [place] [facility] LAB;  Service: Cardiovascular;  Laterality: N/A;  . POLYPECTOMY  03/18/2019   Procedure:  POLYPECTOMY;  Surgeon: [name], MD;  [facility];  Service: Endoscopy;;    Family History  Problem Relation Age of Onset  . Cancer Mother   . Heart attack Father   . Colon cancer Neg Hx     Social History   Socioeconomic History  . Marital status: Married    Spouse name: Not on file  . Number of children: Not on file  . Years of education: Not on file  . Highest education level: Not on file  Occupational History  . Not on file  Tobacco Use  . Smoking status: Current Every Day Smoker    Packs/day: 0.25    Types: Cigarettes  . Smokeless tobacco: Never Used  Substance and Sexual Activity  . Alcohol use: Yes    Comment: occasionally: on weekends (typically a pint on the weekend)  . Drug use: Yes    Types: Marijuana    Comment: smokes for pain  . Sexual activity: Yes  Other Topics Concern  . Not on file  Social History Narrative  . Not on file   Social Determinants of Health   Financial Resource Strain:   . Difficulty of Paying Living Expenses:   Food Insecurity:   . Worried About Charity fundraiser in the Last Year:   . Arboriculturist in the Last Year:   Transportation Needs:   . Lack  of Transportation (Medical):   Marland Kitchen Lack of Transportation (Non-Medical):   Physical Activity:   . Days of Exercise per Week:   . Minutes of Exercise per Session:   Stress:   . Feeling of Stress :   Social Connections:   . Frequency of Communication with Friends and Family:   . Frequency of Social Gatherings with Friends and Family:   . Attends Religious Services:   . Active Member of Clubs or Organizations:   . Attends Archivist Meetings:   Marland Kitchen Marital Status:   Intimate Partner Violence:   . Fear of Current or Ex-Partner:   . Emotionally Abused:   Marland Kitchen Physically Abused:   . Sexually Abused:      PHYSICAL EXAM:  VS: BP 120/73   Pulse (!) 58   Ht 5\' 11"  (1.803 m)   Wt 208 lb (94.3 kg)   BMI 29.01 kg/m  Physical Exam Gen: NAD, alert,  cooperative with exam, well-appearing MSK:  Neck/right shoulder: No signs of atrophy. Pain with external rotation. Pain with external rotation and abduction. Normal grip strength. Trigger points appreciated the trapezius. Normal neck range of motion. Neurovascularly intact     ASSESSMENT & PLAN:   Impingement syndrome of right shoulder Pain is still ongoing.  He feels weak.  It may be more of a component of radicular symptoms as opposed to just being at the shoulder.  He does have significant degenerative changes of the [facility] joint. -Referral to physical therapy. -Counseled on home exercise therapy and supportive care. -Provided Pennsaid samples.  Cervical radiculopathy Has feelings of weakness in his arm itself.  May be more radicular in nature as opposed to being his shoulder. -Gabapentin. -Counseled on home exercise therapy and supportive care. -Referral to physical therapy. -Has a pellet in his leg so may need to consider imaging that before ordering an MRI if needed.

## 2020-02-22 ENCOUNTER — Encounter (HOSPITAL_COMMUNITY): Payer: Self-pay | Admitting: *Deleted

## 2020-02-22 ENCOUNTER — Emergency Department (HOSPITAL_COMMUNITY)
Admission: EM | Admit: 2020-02-22 | Discharge: 2020-02-22 | Disposition: A | Discharge status: Home / Self Care | Payer: Medicaid Other | Attending: Emergency Medicine | Admitting: Emergency Medicine

## 2020-02-22 ENCOUNTER — Other Ambulatory Visit: Payer: Self-pay

## 2020-02-22 DIAGNOSIS — Y999 Unspecified external cause status: Secondary | ICD-10-CM | POA: Diagnosis not present

## 2020-02-22 DIAGNOSIS — Y9389 Activity, other specified: Secondary | ICD-10-CM | POA: Diagnosis not present

## 2020-02-22 DIAGNOSIS — W57XXXA Bitten or stung by nonvenomous insect and other nonvenomous arthropods, initial encounter: Secondary | ICD-10-CM | POA: Insufficient documentation

## 2020-02-22 DIAGNOSIS — S50861A Insect bite (nonvenomous) of right forearm, initial encounter: Secondary | ICD-10-CM | POA: Diagnosis not present

## 2020-02-22 DIAGNOSIS — F17210 Nicotine dependence, cigarettes, uncomplicated: Secondary | ICD-10-CM | POA: Diagnosis not present

## 2020-02-22 DIAGNOSIS — Y929 Unspecified place or not applicable: Secondary | ICD-10-CM | POA: Insufficient documentation

## 2020-02-22 HISTORY — DX: Unspecified osteoarthritis, unspecified site: M19.90

## 2020-02-22 MED ORDER — DIPHENHYDRAMINE HCL 25 MG PO CAPS
25.0000 mg | ORAL_CAPSULE | Freq: Once | ORAL | Status: AC
  Administered 2020-02-22: 25 mg via ORAL
  Filled 2020-02-22: qty 1

## 2020-02-22 MED ORDER — CETIRIZINE HCL 5 MG PO TABS: 5.0000 mg | ORAL_TABLET | Freq: Every day | ORAL | 0 refills | Status: DC

## 2020-02-22 MED ORDER — DIPHENHYDRAMINE HCL 25 MG PO TABS: 25.0000 mg | ORAL_TABLET | Freq: Four times a day (QID) | ORAL | 0 refills | Status: DC | PRN

## 2020-02-22 NOTE — Discharge Instructions (Addendum)
You have been diagnosed today with Insect Bite.  At this time there does not appear to be the presence of an emergent medical condition, however there is always the potential for conditions to change. Please read and follow the below instructions.  Please return to the Emergency Department immediately for any new or worsening symptoms. Please be sure to follow up with your Primary Care Provider within one week regarding your visit today; please call their office to schedule an appointment even if you are feeling better for a follow-up visit. Take the antihistamine medications as prescribed to help with your symptoms.  You may apply ice to the area to help with your symptoms.  Please apply a small amount of antibiotic ointment such as Neosporin to help avoid infection.  Keep the area clean and rinse gently with soapy water twice a day.  Get help right away if: You have joint pain. You have a rash. You feel more tired or sleepy than you normally do. You have neck pain. You have a headache. You feel weaker than you normally do. You have signs of an anaphylactic reaction. Signs may include: Feeling warm in the face. Itchy, red, swollen areas of skin. Swelling of your: Eyes. Lips. Face. Mouth. Tongue. Throat. Trouble with any of these: Breathing. Talking. Swallowing. Loud breathing. Feeling dizzy or light-headed. Passing out. Pain or cramps in your belly. Throwing up. Watery poop. You have any new/concerning or worsening of symptoms  Please read the additional information packets attached to your discharge summary.  Do not take your medicine if  develop an itchy rash, swelling in your mouth or lips, or difficulty breathing; call 911 and seek immediate emergency medical attention if this occurs.  Note: Portions of this text may have been transcribed using voice recognition software. Every effort was made to ensure accuracy; however, inadvertent computerized transcription errors may  still be present.

## 2020-02-22 NOTE — ED Triage Notes (Signed)
Pt c/o red, circular rash to right forearm that came up yesterday after he thinks he was stung by a [place] hornet. Pt reports since he was stung he "feels weird and I'm stumbling". Pt able to walk to triage appropriately without use of assistive devices.

## 2020-02-22 NOTE — ED Provider Notes (Signed)
[facility] EMERGENCY DEPARTMENT Provider Note   CSN: [id number] Arrival date & time: 02/22/20  1517     History Chief Complaint  Patient presents with  . Insect Bite    Lance Cohen is a 49 y.o. male history arthritis, asthma, bipolar, chronic pain, DVT, hypertension, GERD, PE.  Patient presents today for bug bite of the right arm, reports that he began last night he believes he was stung by a "[place] hornet".  He describes sudden onset burning pain constant nonradiating worsened with palpation of the area no alleviating factors pain is gradually improved with time.  He has not attempted any medication for his pain prior to arrival.  He reports that immediately after he was stung he felt "weird", he is unable to adequately describe what this means but it resolved quickly.  Denies fever/chills, headache, vision change, neck pain, chest pain/shortness of breath, cough/mopped assist, nausea/vomiting, abdominal pain, diarrhea, rash, throat swelling, difficulty swallowing or any additional concerns.  Additionally patient denies any tick exposure.  HPI     Past Medical History:  Diagnosis Date  . Arthritis   . Asthma   . Bipolar 1 disorder ([place])   . Chronic knee pain   . DVT (deep venous thrombosis) ([place])   . Hypertension   . Insomnia   . Suicide attempt [facility])    2010 Intentional overdose attempt with Depakote.    Patient Active Problem List   Diagnosis Date Noted  . Cervical radiculopathy 02/18/2020  . Impingement syndrome of right shoulder 01/18/2020  . Rectal pain 06/29/2019  . GERD (gastroesophageal reflux disease) 06/29/2019  . Rectal bleeding 02/22/2019  . Priapism 02/08/2019  . Chest pain 10/19/2017  . Asthma 10/19/2017  . Hypertension 10/19/2017  . Chronic knee pain 10/19/2017  . Pain in limb 12/27/2016  . Acute pulmonary embolism ([place]) 10/23/2016  . Nausea and vomiting 10/23/2016  . S/P IVC filter 10/23/2016  . Tobacco abuse counseling 10/23/2016  . DVT  (deep venous thrombosis) ([place]) 10/18/2016  . Cellulitis of foot 06/24/2011  . Bipolar 1 disorder ([place]) 06/24/2011  . Tobacco abuse 06/24/2011  . Polycythemia 06/24/2011  . Tinea pedis 06/24/2011  . CARPAL TUNNEL SYNDROME, RIGHT 06/21/2010  . CONTUSION, LEFT FOOT 06/05/2010  . FOREIGN BODY, FOOT 04/17/2010    Past Surgical History:  Procedure Laterality Date  . COLONOSCOPY WITH PROPOFOL N/A 03/18/2019   Procedure: COLONOSCOPY WITH PROPOFOL;  Surgeon: [name], MD;  [facility];  Service: Endoscopy;  Laterality: N/A;  2:30pm  . FOOT SURGERY     had peice of wire removed  . IVC FILTER REMOVAL N/A 02/24/2017   Procedure: IVC Filter Removal;  Surgeon: [name], MD;  Location: [place] CV LAB;  Service: Cardiovascular;  Laterality: N/A;  . PERIPHERAL VASCULAR CATHETERIZATION Right 10/22/2016   Procedure: Thrombectomy, thrombolyisis;  Surgeon: [name], MD;  Location: [place] CV LAB;  Service: Cardiovascular;  Laterality: Right;  . PERIPHERAL VASCULAR CATHETERIZATION N/A 10/22/2016   Procedure: IVC Filter Insertion;  Surgeon: [name], MD;  Location: [place] CV LAB;  Service: Cardiovascular;  Laterality: N/A;  . POLYPECTOMY  03/18/2019   Procedure: POLYPECTOMY;  Surgeon: [name], MD;  Location: [facility];  Service: Endoscopy;;       Family History  Problem Relation Age of Onset  . Cancer Mother   . Heart attack Father   . Colon cancer Neg Hx     Social History   Tobacco Use  .  Smoking status: Current Every Day Smoker    Packs/day: 0.50    Types: Cigarettes  . Smokeless tobacco: Never Used  Substance Use Topics  . Alcohol use: Yes    Comment: occasionally: on weekends (typically a pint on the weekend)  . Drug use: Yes    Types: Marijuana    Comment: smokes for pain    Home Medications Prior to Admission medications   Medication Sig Start Date End Date Taking? Authorizing Provider  amLODipine (NORVASC) 10 MG  tablet Take 10 mg by mouth daily.    [provider]  cetirizine (ZYRTEC) 5 MG tablet Take 1 tablet (5 mg total) by mouth daily for 7 days. 02/22/20 02/29/20  Nuala Alpha A, PA-C  diclofenac Sodium (VOLTAREN) 1 % GEL Apply 4 g topically 4 (four) times daily. 01/06/20   Recardo Evangelist, PA-C  diphenhydrAMINE (BENADRYL) 25 MG tablet Take 1 tablet (25 mg total) by mouth every 6 (six) hours as needed for up to 7 days. 02/22/20 02/29/20  Nuala Alpha A, PA-C  doxepin (SINEQUAN) 10 MG capsule Take 10 mg by mouth daily.     [provider]  gabapentin (NEURONTIN) 300 MG capsule Take 1 capsule (300 mg total) by mouth 3 (three) times daily as needed. 02/18/20   Rosemarie Ax, MD  hydrOXYzine (ATARAX/VISTARIL) 50 MG tablet Take 50 mg by mouth 3 (three) times daily as needed.    [provider]  indomethacin (INDOCIN) 25 MG capsule Take 25 mg by mouth 2 (two) times daily with a meal.    [provider]  lisinopril (ZESTRIL) 10 MG tablet Take 10 mg by mouth daily.    [provider]  meclizine (ANTIVERT) 25 MG tablet Take 1 tablet (25 mg total) by mouth 3 (three) times daily as needed for dizziness. 07/03/19   Alveria Apley, PA-C  methocarbamol (ROBAXIN) 500 MG tablet Take 1,000 mg by mouth 2 (two) times daily as needed.     [provider]  omeprazole (PRILOSEC) 40 MG capsule Take 1 capsule (40 mg total) by mouth daily. 06/29/19   Carlis Stable, NP  ondansetron (ZOFRAN ODT) 4 MG disintegrating tablet Take 1 tablet (4 mg total) by mouth every 8 (eight) hours as needed for nausea or vomiting. 05/07/19   Fransico Meadow, PA-C  predniSONE (DELTASONE) 5 MG tablet Take 6 pills for first day, 5 pills second day, 4 pills third day, 3 pills fourth day, 2 pills the fifth day, and 1 pill sixth day. 01/18/20   Rosemarie Ax, MD  rivaroxaban (XARELTO) 20 MG TABS tablet Take 20 mg by mouth daily with supper.    [provider]    Allergies    Bee venom,  Penicillins, and Sulfa antibiotics  Review of Systems   Review of Systems  Constitutional: Negative for chills and fever.  Respiratory: Negative.  Negative for cough and shortness of breath.   Cardiovascular: Negative.  Negative for chest pain.  Gastrointestinal: Negative.  Negative for abdominal pain, diarrhea, nausea and vomiting.  Musculoskeletal: Negative for arthralgias and myalgias.  Skin: Positive for wound.  Neurological: Negative for dizziness, weakness, numbness and headaches.    Physical Exam Updated Vital Signs BP 125/79 (BP Location: Right Arm)   Pulse 87   Temp 98.2 F (36.8 C) (Oral)   Resp 16   Ht 5\' 11"  (1.803 m)   Wt 94.3 kg   SpO2 100%   BMI 29.01 kg/m   Physical Exam Constitutional:  General: He is not in acute distress.    Appearance: Normal appearance. He is well-developed. He is not ill-appearing or diaphoretic.  HENT:     Head: Normocephalic and atraumatic.     Right Ear: External ear normal.     Left Ear: External ear normal.     Nose: Nose normal.  Eyes:     General: Vision grossly intact. Gaze aligned appropriately.     Pupils: Pupils are equal, round, and reactive to light.  Neck:     Trachea: Trachea and phonation normal. No tracheal deviation.  Cardiovascular:     Rate and Rhythm: Normal rate and regular rhythm.     Pulses:          Radial pulses are 2+ on the right side and 2+ on the left side.  Pulmonary:     Effort: Pulmonary effort is normal. No respiratory distress.  Abdominal:     General: There is no distension.     Palpations: Abdomen is soft.     Tenderness: There is no abdominal tenderness. There is no guarding or rebound.  Musculoskeletal:        General: Normal range of motion.     Cervical back: Normal range of motion.     Comments: Range of motion at all major joints intact with appropriate strength without increase in pain.  Skin:    General: Skin is warm and dry.     Comments: Mild erythema of the forearm as  pictured below, appears to extend around 6 to 7 cm in diameter, appears to have a small excoriation centrally.  See picture below.  Neurological:     Mental Status: He is alert.     GCS: GCS eye subscore is 4. GCS verbal subscore is 5. GCS motor subscore is 6.     Comments: Speech is clear and goal oriented, follows commands Major Cranial nerves without deficit, no facial droop Normal strength in upper and lower extremities bilaterally including dorsiflexion and plantar flexion, strong and equal grip strength Sensation normal to light and sharp touch Moves extremities without ataxia, coordination intact Normal finger to nose and rapid alternating movements Neg romberg, no pronator drift Normal gait Normal heel-shin and balance  Psychiatric:        Behavior: Behavior normal.       ED Results / Procedures / Treatments   Labs (all labs ordered are listed, but only abnormal results are displayed) Labs Reviewed - No data to display  EKG None  Radiology No results found.  Procedures Procedures (including critical care time)  Medications Ordered in ED Medications  diphenhydrAMINE (BENADRYL) capsule 25 mg (25 mg Oral Given 02/22/20 1734)    ED Course  I have reviewed the triage vital signs and the nursing notes.  Pertinent labs & imaging results that were available during my care of the patient were reviewed by me and considered in my medical decision making (see chart for details).    MDM Rules/Calculators/A&P                      49 year old male presents today with rash consistent with insect bite.  Does not appear to be a tick bite he reports this was a "[place] hornet". Patient denies any difficulty breathing or swallowing.  Pt has a patent airway without stridor and is handling secretions without difficulty; no angioedema. No blisters, no pustules, no warmth, no draining sinus tracts, no superficial abscesses, no bullous impetigo, no vesicles, no desquamation,  no target  lesions with dusky purpura or a central bulla. Not tender to touch. No concern for superimposed infection. No concern for SJS, TEN, TSS, tick borne illness, syphilis or other life-threatening condition.  Will start patient on H1 and H2 antihistamines, encourage cool compresses and PCP follow-up.  Chart review shows Tdap up-to-date in November 2019.  Additionally will encourage patient put a small amount of antibiotic ointment over the area and monitor for signs of infection.  Additionally suspect patient's weird and stumbling sensation that he is feeling after being started may have been related to stress and anxiety.  Currently he is asymptomatic aside from stinging pain at the bite site, he has a normal neuro exam and no other symptoms suggestive of emergent pathologies at this time. - At discharge patient is standing by the door requesting discharge no acute distress.  At this time there does not appear to be any evidence of an acute emergency medical condition and the patient appears stable for discharge with appropriate outpatient follow up. Diagnosis was discussed with patient who verbalizes understanding of care plan and is agreeable to discharge. I have discussed return precautions with patient who verbalizes understanding. Patient encouraged to follow-up with their PCP. All questions answered.  Patient's case discussed with Dr. Karle Starch who agrees with plan to discharge with follow-up.   Note: Portions of this report may have been transcribed using voice recognition software. Every effort was made to ensure accuracy; however, inadvertent computerized transcription errors may still be present. Final Clinical Impression(s) / ED Diagnoses Final diagnoses:  Insect bite of right forearm, initial encounter    Rx / DC Orders ED Discharge Orders         Ordered    diphenhydrAMINE (BENADRYL) 25 MG tablet  Every 6 hours PRN     02/22/20 1752    cetirizine (ZYRTEC) 5 MG tablet  Daily      02/22/20 1752           Gari Crown 02/22/20 1809    [name], MD 02/22/20 [phone]

## 2020-02-23 ENCOUNTER — Other Ambulatory Visit: Payer: Self-pay

## 2020-02-23 ENCOUNTER — Emergency Department (HOSPITAL_COMMUNITY): Payer: Medicaid Other

## 2020-02-23 ENCOUNTER — Encounter (HOSPITAL_COMMUNITY): Payer: Self-pay | Admitting: Emergency Medicine

## 2020-02-23 ENCOUNTER — Telehealth: Payer: Self-pay | Admitting: Family Medicine

## 2020-02-23 ENCOUNTER — Emergency Department (HOSPITAL_COMMUNITY)
Admission: EM | Admit: 2020-02-23 | Discharge: 2020-02-23 | Disposition: A | Discharge status: Home / Self Care | Payer: Medicaid Other | Attending: Emergency Medicine | Admitting: Emergency Medicine

## 2020-02-23 DIAGNOSIS — I10 Essential (primary) hypertension: Secondary | ICD-10-CM | POA: Diagnosis not present

## 2020-02-23 DIAGNOSIS — I82611 Acute embolism and thrombosis of superficial veins of right upper extremity: Secondary | ICD-10-CM

## 2020-02-23 DIAGNOSIS — F17210 Nicotine dependence, cigarettes, uncomplicated: Secondary | ICD-10-CM | POA: Diagnosis not present

## 2020-02-23 DIAGNOSIS — Z79899 Other long term (current) drug therapy: Secondary | ICD-10-CM | POA: Diagnosis not present

## 2020-02-23 DIAGNOSIS — Z23 Encounter for immunization: Secondary | ICD-10-CM | POA: Diagnosis not present

## 2020-02-23 DIAGNOSIS — L0291 Cutaneous abscess, unspecified: Secondary | ICD-10-CM

## 2020-02-23 DIAGNOSIS — R609 Edema, unspecified: Secondary | ICD-10-CM | POA: Diagnosis not present

## 2020-02-23 DIAGNOSIS — L02413 Cutaneous abscess of right upper limb: Secondary | ICD-10-CM | POA: Insufficient documentation

## 2020-02-23 DIAGNOSIS — Z7901 Long term (current) use of anticoagulants: Secondary | ICD-10-CM | POA: Diagnosis not present

## 2020-02-23 DIAGNOSIS — L539 Erythematous condition, unspecified: Secondary | ICD-10-CM | POA: Diagnosis present

## 2020-02-23 MED ORDER — POVIDONE-IODINE 10 % OINT PACKET: TOPICAL_OINTMENT | Freq: Once | CUTANEOUS | Status: DC

## 2020-02-23 MED ORDER — CLINDAMYCIN HCL 300 MG PO CAPS: 300.0000 mg | ORAL_CAPSULE | Freq: Three times a day (TID) | ORAL | 0 refills | Status: AC

## 2020-02-23 MED ORDER — TETANUS-DIPHTH-ACELL PERTUSSIS 5-2.5-18.5 LF-MCG/0.5 IM SUSP
0.5000 mL | Freq: Once | INTRAMUSCULAR | Status: AC
  Administered 2020-02-23: 13:00:00 0.5 mL via INTRAMUSCULAR
  Filled 2020-02-23: qty 0.5

## 2020-02-23 MED ORDER — POVIDONE-IODINE 10 % EX SOLN
CUTANEOUS | Status: AC
  Filled 2020-02-23: qty 15

## 2020-02-23 MED ORDER — LIDOCAINE-EPINEPHRINE (PF) 2 %-1:200000 IJ SOLN
10.0000 mL | Freq: Once | INTRAMUSCULAR | Status: AC
  Administered 2020-02-23: 10 mL
  Filled 2020-02-23: qty 10

## 2020-02-23 NOTE — Discharge Instructions (Addendum)
Warm compress to arm. Take the antibiotics. Wound recheck in 48 hours.  Take you rblood thinner as you have a superficial blood clot in your arm  Return if you develop chest pain, shortness of breath

## 2020-02-23 NOTE — ED Provider Notes (Signed)
[facility] EMERGENCY DEPARTMENT Provider Note   CSN: [id number] Arrival date & time: 02/23/20  1222     History Chief Complaint  Patient presents with  . Abscess    ADREN BISSONNETTE is a 49 y.o. male with past medical history significant for DVT, chronic knee pain, asthma, arthritis who presents for evaluation of right arm erythema.  Was seen yesterday for insect bite.  States he was stung by a "[place] hornet."  States he felt immediate pain to his right trapezius area immediately after the sting.  Worse with palpation.  Associated erythema and warmth.  Was seen emergency department yesterday and thought this is likely allergic reaction.  Does not prescribe antibiotics.  He has been taking Benadryl and Zyrtec without relief.  Patient states the wound now has a "head."  Feels like the erythema has spread.  No fever, chills, nausea, vomiting, headache, vision changes, chest pain, shortness of breath, cough, hemoptysis, abdominal pain, diarrhea, lesions, sensation of throat closing, urticaria.  Denies additional aggravating or alleviating factors.  History obtained from patient and past medical records.  No interpreter is used.  Intermittently compliant with his Xarelto. Has prescription at home.  HPI     Past Medical History:  Diagnosis Date  . Arthritis   . Asthma   . Bipolar 1 disorder ([place])   . Chronic knee pain   . DVT (deep venous thrombosis) ([place])   . Hypertension   . Insomnia   . Suicide attempt [facility]    2010 Intentional overdose attempt with Depakote.    Patient Active Problem List   Diagnosis Date Noted  . Cervical radiculopathy 02/18/2020  . Impingement syndrome of right shoulder 01/18/2020  . Rectal pain 06/29/2019  . GERD (gastroesophageal reflux disease) 06/29/2019  . Rectal bleeding 02/22/2019  . Priapism 02/08/2019  . Chest pain 10/19/2017  . Asthma 10/19/2017  . Hypertension 10/19/2017  . Chronic knee pain 10/19/2017  . Pain in limb 12/27/2016  . Acute  pulmonary embolism ([place]) 10/23/2016  . Nausea and vomiting 10/23/2016  . S/P IVC filter 10/23/2016  . Tobacco abuse counseling 10/23/2016  . DVT (deep venous thrombosis) ([place]) 10/18/2016  . Cellulitis of foot 06/24/2011  . Bipolar 1 disorder ([place]) 06/24/2011  . Tobacco abuse 06/24/2011  . Polycythemia 06/24/2011  . Tinea pedis 06/24/2011  . CARPAL TUNNEL SYNDROME, RIGHT 06/21/2010  . CONTUSION, LEFT FOOT 06/05/2010  . FOREIGN BODY, FOOT 04/17/2010    Past Surgical History:  Procedure Laterality Date  . COLONOSCOPY WITH PROPOFOL N/A 03/18/2019   Procedure: COLONOSCOPY WITH PROPOFOL;  Surgeon: [name], MD;  [facility];  Service: Endoscopy;  Laterality: N/A;  2:30pm  . FOOT SURGERY     had peice of wire removed  . IVC FILTER REMOVAL N/A 02/24/2017   Procedure: IVC Filter Removal;  Surgeon: [name], MD;  Location: [place] CV LAB;  Service: Cardiovascular;  Laterality: N/A;  . PERIPHERAL VASCULAR CATHETERIZATION Right 10/22/2016   Procedure: Thrombectomy, thrombolyisis;  Surgeon: [name], MD;  Location: [place] CV LAB;  Service: Cardiovascular;  Laterality: Right;  . PERIPHERAL VASCULAR CATHETERIZATION N/A 10/22/2016   Procedure: IVC Filter Insertion;  Surgeon: [name], MD;  Location: [place] CV LAB;  Service: Cardiovascular;  Laterality: N/A;  . POLYPECTOMY  03/18/2019   Procedure: POLYPECTOMY;  Surgeon: [name], MD;  [facility];  Service: Endoscopy;;       Family History  Problem Relation Age of Onset  .  Cancer Mother   . Heart attack Father   . Colon cancer Neg Hx     Social History   Tobacco Use  . Smoking status: Current Every Day Smoker    Packs/day: 0.50    Types: Cigarettes  . Smokeless tobacco: Never Used  Substance Use Topics  . Alcohol use: Yes    Comment: occasionally: on weekends (typically a pint on the weekend)  . Drug use: Yes    Types: Marijuana    Comment: smokes for pain     Home Medications Prior to Admission medications   Medication Sig Start Date End Date Taking? Authorizing Provider  amLODipine (NORVASC) 10 MG tablet Take 10 mg by mouth daily.    [provider]  cetirizine (ZYRTEC) 5 MG tablet Take 1 tablet (5 mg total) by mouth daily for 7 days. 02/22/20 02/29/20  Nuala Alpha A, PA-C  clindamycin (CLEOCIN) 300 MG capsule Take 1 capsule (300 mg total) by mouth 3 (three) times daily for 7 days. 02/23/20 03/01/20  Dalaya Suppa A, PA-C  diclofenac Sodium (VOLTAREN) 1 % GEL Apply 4 g topically 4 (four) times daily. 01/06/20   Recardo Evangelist, PA-C  diphenhydrAMINE (BENADRYL) 25 MG tablet Take 1 tablet (25 mg total) by mouth every 6 (six) hours as needed for up to 7 days. 02/22/20 02/29/20  Nuala Alpha A, PA-C  doxepin (SINEQUAN) 10 MG capsule Take 10 mg by mouth daily.     [provider]  gabapentin (NEURONTIN) 300 MG capsule Take 1 capsule (300 mg total) by mouth 3 (three) times daily as needed. 02/18/20   Rosemarie Ax, MD  hydrOXYzine (ATARAX/VISTARIL) 50 MG tablet Take 50 mg by mouth 3 (three) times daily as needed.    [provider]  indomethacin (INDOCIN) 25 MG capsule Take 25 mg by mouth 2 (two) times daily with a meal.    [provider]  lisinopril (ZESTRIL) 10 MG tablet Take 10 mg by mouth daily.    [provider]  meclizine (ANTIVERT) 25 MG tablet Take 1 tablet (25 mg total) by mouth 3 (three) times daily as needed for dizziness. 07/03/19   Alveria Apley, PA-C  methocarbamol (ROBAXIN) 500 MG tablet Take 1,000 mg by mouth 2 (two) times daily as needed.     [provider]  omeprazole (PRILOSEC) 40 MG capsule Take 1 capsule (40 mg total) by mouth daily. 06/29/19   Carlis Stable, NP  ondansetron (ZOFRAN ODT) 4 MG disintegrating tablet Take 1 tablet (4 mg total) by mouth every 8 (eight) hours as needed for nausea or vomiting. 05/07/19   Fransico Meadow, PA-C  predniSONE (DELTASONE) 5 MG tablet  Take 6 pills for first day, 5 pills second day, 4 pills third day, 3 pills fourth day, 2 pills the fifth day, and 1 pill sixth day. 01/18/20   Rosemarie Ax, MD  rivaroxaban (XARELTO) 20 MG TABS tablet Take 20 mg by mouth daily with supper.    [provider]    Allergies    Bee venom, Penicillins, and Sulfa antibiotics  Review of Systems   Review of Systems  Constitutional: Negative.   HENT: Negative.   Respiratory: Negative.   Cardiovascular: Negative.   Gastrointestinal: Negative.   Genitourinary: Negative.   Musculoskeletal: Negative for neck pain and neck stiffness.       Right triceps pain  Skin: Positive for wound.       Right triceps edema, erythema, warmth  Neurological: Negative.  All other systems reviewed and are negative.   Physical Exam Updated Vital Signs BP 122/74 (BP Location: Right Arm)   Pulse 72   Temp 98.6 F (37 C) (Oral)   Resp 18   Ht 5\' 11"  (1.803 m)   Wt 94.3 kg   SpO2 99%   BMI 29.01 kg/m   Physical Exam Vitals and nursing note reviewed.  Constitutional:      General: He is not in acute distress.    Appearance: He is well-developed. He is not ill-appearing, toxic-appearing or diaphoretic.  HENT:     Head: Normocephalic and atraumatic.     Nose: Nose normal.     Mouth/Throat:     Mouth: Mucous membranes are moist.     Pharynx: Oropharynx is clear.  Eyes:     Pupils: Pupils are equal, round, and reactive to light.  Cardiovascular:     Rate and Rhythm: Normal rate and regular rhythm.     Pulses: Normal pulses.     Heart sounds: Normal heart sounds.  Pulmonary:     Effort: Pulmonary effort is normal. No respiratory distress.     Breath sounds: Normal breath sounds.  Abdominal:     General: Bowel sounds are normal. There is no distension.     Palpations: Abdomen is soft.     Tenderness: There is no abdominal tenderness. There is no right CVA tenderness, left CVA tenderness, guarding or rebound.  Musculoskeletal:         General: Normal range of motion.     Right shoulder: Normal.     Left shoulder: Normal.     Right upper arm: Swelling, edema and tenderness present. No deformity, lacerations or bony tenderness.     Left upper arm: Normal.     Right elbow: Normal.     Left elbow: Normal.     Right forearm: Normal.     Left forearm: Normal.     Right wrist: Normal.     Left wrist: Normal.     Right hand: Normal.     Left hand: Normal.     Cervical back: Normal range of motion and neck supple.  Skin:    General: Skin is warm and dry.     Capillary Refill: Capillary refill takes less than 2 seconds.     Comments: 10 cm x  8cm area of erythema and warmth to proximal triceps on right upper extremity.  No circumferential edema, erythema or warmth. 5 centimeters area of induration with an centimeter area of fluctuance with crusted wound.  No active bleeding or drainage. Brisk capillary refill.    Neurological:     General: No focal deficit present.     Mental Status: He is alert.     Cranial Nerves: Cranial nerves are intact.     Sensory: Sensation is intact.     Motor: Motor function is intact.     Coordination: Coordination is intact.     Gait: Gait is intact.     Comments: 5/5 strength bilateral upper extremities without difficulty       ED Results / Procedures / Treatments   Labs (all labs ordered are listed, but only abnormal results are displayed) Labs Reviewed - No data to display  EKG None  Radiology US Venous Img Upper Uni Right(DVT)  Result Date: 02/23/2020 CLINICAL DATA:  49 year old male with a history of acute right upper extremity pain and edema with redness EXAM: RIGHT UPPER EXTREMITY VENOUS DOPPLER ULTRASOUND TECHNIQUE: Gray-scale sonography with  graded compression, as well as color Doppler and duplex ultrasound were performed to evaluate the upper extremity deep venous system from the level of the subclavian vein and including the jugular, axillary, basilic, radial, ulnar and upper  cephalic vein. Spectral Doppler was utilized to evaluate flow at rest and with distal augmentation maneuvers. COMPARISON:  None. FINDINGS: Contralateral Subclavian Vein: Respiratory phasicity is normal and symmetric with the symptomatic side. No evidence of thrombus. Normal compressibility. Internal Jugular Vein: No evidence of thrombus. Normal compressibility, respiratory phasicity and response to augmentation. Subclavian Vein: No evidence of thrombus. Normal compressibility, respiratory phasicity and response to augmentation. Axillary Vein: No evidence of thrombus. Normal compressibility, respiratory phasicity and response to augmentation. Occlusive thrombus of the basilic vein and the cephalic vein, noncompressible with no flow. Brachial Veins: No evidence of thrombus. Normal compressibility, respiratory phasicity and response to augmentation. Radial Veins: No evidence of thrombus. Normal compressibility, respiratory phasicity and response to augmentation. Ulnar Veins: No evidence of thrombus. Normal compressibility, respiratory phasicity and response to augmentation. Other Findings:  Edema of the forearm IMPRESSION: Sonographic survey of the right upper extremity negative for DVT. Superficial thrombophlebitis with occlusive thrombus of the basilic vein and cephalic veins. Associated edema of the forearm. Electronically Signed   By: Corrie Mckusick D.O.   On: 02/23/2020 13:53    Procedures .Marland KitchenIncision and Drainage  Date/Time: 02/23/2020 2:17 PM Performed by: Nettie Elm, PA-C Authorized by: Nettie Elm, PA-C   Consent:    Consent obtained:  Verbal   Consent given by:  Patient   Risks discussed:  Bleeding, incomplete drainage, pain, damage to other organs and infection   Alternatives discussed:  No treatment, delayed treatment, alternative treatment, observation and referral Universal protocol:    Procedure explained and questions answered to patient or proxy's satisfaction: yes      Relevant documents present and verified: yes     Test results available and properly labeled: yes     Imaging studies available: yes     Required blood products, implants, devices, and special equipment available: yes     Site/side marked: yes     Immediately prior to procedure a time out was called: yes     Patient identity confirmed:  Verbally with patient Location:    Type:  Abscess   Size:  1cm   Location:  Upper extremity   Upper extremity location:  Arm   Arm location:  R upper arm Pre-procedure details:    Skin preparation:  Betadine Anesthesia (see MAR for exact dosages):    Anesthesia method:  Local infiltration   Local anesthetic:  Lidocaine 1% WITH epi Procedure type:    Complexity:  Complex Procedure details:    Incision types:  Single straight   Incision depth:  Subcutaneous   Scalpel blade:  11   Drainage amount:  Scant Post-procedure details:    Patient tolerance of procedure:  Procedure terminated at patient's request   (including critical care time)  Medications Ordered in ED Medications  povidone-iodine (BETADINE) 10 % ointment ( Topical Not Given 02/23/20 1313)  lidocaine-EPINEPHrine (XYLOCAINE W/EPI) 2 %-1:200000 (PF) injection 10 mL (10 mLs Infiltration Given 02/23/20 1310)  Tdap (BOOSTRIX) injection 0.5 mL (0.5 mLs Intramuscular Given 02/23/20 1309)  povidone-iodine (BETADINE) 10 % external solution (  Given 02/23/20 1312)    ED Course  I have reviewed the triage vital signs and the nursing notes.  Pertinent labs & imaging results that were available during my care of the patient were  reviewed by me and considered in my medical decision making (see chart for details).  49 year old male presents for evaluation of insect bite.  He is afebrile, nonseptic, not ill-appearing.  Was apparently bit by a "[place] hornet.  Yesterday.  He observed something biting him apparently.  Was seen here in the emergency department yesterday and was prescribed Zyrtec and  Benadryl.  Patient states today he has noticed a "head" to the wound as well as some spreading erythema.  Is associated warmth, fluctuance and induration.  He is neurovascularly intact.  He has a normal musculoskeletal exam.  He has no pain with passive or active range of motion into his right upper extremity.  He has no chest pain, shortness of breath.  He is not tachycardic, tachypneic or hypoxic.  Does have history of DVT however wound seems more typical of myelitis with abscess however given history will obtain ultrasound to rule out DVT.  If negative plan on incision and drainage as well as antibiotics.  [place] without DVT however with superficial thrombophlebitis and occlusion cephalic and basilic vein. Attempt at abscess drainage however terminated due to patient inability to tolerate. Continues to be NV intact. Low suspicion for myositis, septic joint, vascular occlusion, fracture, dislocation. Will need wound recheck in 48 hours or return sooner it worsening redness. No CP, SOB, tachycardia, tachypnea or hypoxia.  Low suspicion for PE.  Due to ultrasound findings encourage patient compliance with his anticoagulation at home.  States he does have a prescription of this and will start taking this as soon as he return for home.  The patient has been appropriately medically screened and/or stabilized in the ED. I have low suspicion for any other emergent medical condition which would require further screening, evaluation or treatment in the ED or require inpatient management.  Patient is hemodynamically stable and in no acute distress.  Patient able to ambulate in department prior to ED.  Evaluation does not show acute pathology that would require ongoing or additional emergent interventions while in the emergency department or further inpatient treatment.  I have discussed the diagnosis with the patient and answered all questions.  Pain is been managed while in the emergency department and patient has no  further complaints prior to discharge.  Patient is comfortable with plan discussed in room and is stable for discharge at this time.  I have discussed strict return precautions for returning to the emergency department.  Patient was encouraged to follow-up with PCP/specialist refer to at discharge.    MDM Rules/Calculators/A&P                       Final Clinical Impression(s) / ED Diagnoses Final diagnoses:  Edema  Abscess  Acute basilic vein thrombosis, right    Rx / DC Orders ED Discharge Orders         Ordered    clindamycin (CLEOCIN) 300 MG capsule  3 times daily     02/23/20 1414           A[place] Lebeau A, PA-C 02/23/20 1418    Davonna Belling, MD 02/24/20 [phone]

## 2020-02-23 NOTE — ED Notes (Signed)
R forearm with reddened raised area   Pt is disheveled and appears dirty

## 2020-02-23 NOTE — Telephone Encounter (Signed)
Patient called stating the pain in his right arm has increased. He has been taking gabapentin and pennsaid with no relief. He woke up this morning unable to move his arm.  Patient requesting a call back

## 2020-02-23 NOTE — ED Triage Notes (Signed)
Seen here last evening   Had Rx filled  Took 2 antibiotic tabs  Worse   Here for recheck

## 2020-02-24 NOTE — Telephone Encounter (Signed)
Spoke with patient. Informed about increasing gabapentin. He doesn't always have a vehicle. Informed that [facility] offers rides and he can ask about it.   Rosemarie Ax, MD [facility] 02/24/2020, 11:58 AM

## 2020-02-28 ENCOUNTER — Ambulatory Visit: Payer: Medicaid Other | Attending: Family Medicine | Admitting: Physical Therapy

## 2020-03-02 ENCOUNTER — Telehealth: Payer: Self-pay | Admitting: Family Medicine

## 2020-03-02 DIAGNOSIS — M5412 Radiculopathy, cervical region: Secondary | ICD-10-CM

## 2020-03-02 NOTE — Telephone Encounter (Signed)
Patient reports symptoms are getting worse at the neck as well as radiating down his right arm.  He has not received much improvement with the gabapentin.  His primary has provided him another round of prednisone.  We will pursue a MRI of the cervical spine to evaluate for nerve impingement.  For the consideration of epidural.  He can have Valium if needed for his MRI.  Rosemarie Ax, MD [facility] 03/02/2020, 10:26 AM

## 2020-03-16 NOTE — Progress Notes (Signed)
Lance Cohen - 49 y.o. male MRN [id number]  Date of birth: 12-Feb-1971  SUBJECTIVE:  Including CC & ROS.  Chief Complaint  Patient presents with  . Follow-up    neck    Lance Cohen is a 49 y.o. male that is following up for his right-sided radicular type pain.  The pain is still severe in nature and intermittent.  Pain can be through the arm and radiating to the neck.  It is worse with certain movements.  Has had limited improvement thus far with medications and modalities.  He does have an MRI scheduled of the cervical spine but not until June.   Review of Systems See HPI   HISTORY: Past Medical, Surgical, Social, and Family History Reviewed & Updated per EMR.   Pertinent Historical Findings include:  Past Medical History:  Diagnosis Date  . Arthritis   . Asthma   . Bipolar 1 disorder ([place])   . Chronic knee pain   . DVT (deep venous thrombosis) ([place])   . Hypertension   . Insomnia   . Suicide attempt [facility])    2010 Intentional overdose attempt with Depakote.    Past Surgical History:  Procedure Laterality Date  . COLONOSCOPY WITH PROPOFOL N/A 03/18/2019   Procedure: COLONOSCOPY WITH PROPOFOL;  Surgeon: [name], MD;  [facility];  Service: Endoscopy;  Laterality: N/A;  2:30pm  . FOOT SURGERY     had peice of wire removed  . IVC FILTER REMOVAL N/A 02/24/2017   Procedure: IVC Filter Removal;  Surgeon: [name], MD;  Location: [place] CV LAB;  Service: Cardiovascular;  Laterality: N/A;  . PERIPHERAL VASCULAR CATHETERIZATION Right 10/22/2016   Procedure: Thrombectomy, thrombolyisis;  Surgeon: [name], MD;  Location: [place] CV LAB;  Service: Cardiovascular;  Laterality: Right;  . PERIPHERAL VASCULAR CATHETERIZATION N/A 10/22/2016   Procedure: IVC Filter Insertion;  Surgeon: [name], MD;  Location: [place] CV LAB;  Service: Cardiovascular;  Laterality: N/A;  . POLYPECTOMY  03/18/2019   Procedure: POLYPECTOMY;  Surgeon:  [name], MD;  [facility];  Service: Endoscopy;;    Family History  Problem Relation Age of Onset  . Cancer Mother   . Heart attack Father   . Colon cancer Neg Hx     Social History   Socioeconomic History  . Marital status: Married    Spouse name: Not on file  . Number of children: Not on file  . Years of education: Not on file  . Highest education level: Not on file  Occupational History  . Not on file  Tobacco Use  . Smoking status: Current Every Day Smoker    Packs/day: 0.50    Types: Cigarettes  . Smokeless tobacco: Never Used  Substance and Sexual Activity  . Alcohol use: Yes    Comment: occasionally: on weekends (typically a pint on the weekend)  . Drug use: Yes    Types: Marijuana    Comment: smokes for pain  . Sexual activity: Yes  Other Topics Concern  . Not on file  Social History Narrative  . Not on file   Social Determinants of Health   Financial Resource Strain:   . Difficulty of Paying Living Expenses:   Food Insecurity:   . Worried About Charity fundraiser in the Last Year:   . Arboriculturist in the Last Year:   Transportation Needs:   . Film/video editor (Medical):   Marland Kitchen  Lack of Transportation (Non-Medical):   Physical Activity:   . Days of Exercise per Week:   . Minutes of Exercise per Session:   Stress:   . Feeling of Stress :   Social Connections:   . Frequency of Communication with Friends and Family:   . Frequency of Social Gatherings with Friends and Family:   . Attends Religious Services:   . Active Member of Clubs or Organizations:   . Attends Archivist Meetings:   Marland Kitchen Marital Status:   Intimate Partner Violence:   . Fear of Current or Ex-Partner:   . Emotionally Abused:   Marland Kitchen Physically Abused:   . Sexually Abused:      PHYSICAL EXAM:  VS: BP 116/71   Pulse 61   Ht 5\' 11"  (1.803 m)   Wt 218 lb (98.9 kg)   BMI 30.40 kg/m  Physical Exam Gen: NAD, alert, cooperative with exam,  well-appearing MSK:  Neck: He holds his head in a forward flexed position. No signs of atrophy. Normal shoulder range of motion on the right. Neurovascularly intact     ASSESSMENT & PLAN:   Cervical radiculopathy He has a cervical spine MRI scheduled for next month.  Pain is still ongoing. -Counseled on home exercise therapy and supportive care. -Can try cervical soft collar. -He can try the medication sent by his primary provider -We will have virtual visit once MRI is resulted.

## 2020-03-17 ENCOUNTER — Encounter: Payer: Self-pay | Admitting: Family Medicine

## 2020-03-17 ENCOUNTER — Other Ambulatory Visit: Payer: Self-pay

## 2020-03-17 ENCOUNTER — Ambulatory Visit (INDEPENDENT_AMBULATORY_CARE_PROVIDER_SITE_OTHER): Payer: Medicaid Other | Admitting: Family Medicine

## 2020-03-17 DIAGNOSIS — M5412 Radiculopathy, cervical region: Secondary | ICD-10-CM

## 2020-03-17 NOTE — Assessment & Plan Note (Signed)
He has a cervical spine MRI scheduled for next month.  Pain is still ongoing. -Counseled on home exercise therapy and supportive care. -Can try cervical soft collar. -He can try the medication sent by his primary provider -We will have virtual visit once MRI is resulted.

## 2020-03-17 NOTE — Patient Instructions (Signed)
Good to see you Please try heat as needed You can try the soft collar.  You can try the medications sent by your another provided if you want to.    Please send me a message in MyChart with any questions or updates.  We will setup a virtual visit once the MRi is resulted. .   --Dr. Raeford Razor

## 2020-03-30 ENCOUNTER — Telehealth (INDEPENDENT_AMBULATORY_CARE_PROVIDER_SITE_OTHER): Payer: Self-pay | Admitting: Vascular Surgery

## 2020-03-30 NOTE — Telephone Encounter (Signed)
The patient needs to talk to his PCP about seeing a urologist.  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

## 2020-03-30 NOTE — Telephone Encounter (Signed)
Patient called states he has had a DVT in his groin  For about two years now and every time a "busts a nut, it hurts like hell". He would like advice on what he can do about his because he can't stand it anymore.

## 2020-03-30 NOTE — Telephone Encounter (Signed)
Patient was made aware with medical advice and stated that he do have a PCP but the provider is a PA and not a doctor.The patient stated that he spoke with his PCP about was going on and stated that the provider just look at him. The patient informed that he was giving meds to help with erection go down but informed that this all started after the dvt,

## 2020-04-11 ENCOUNTER — Ambulatory Visit
Admission: RE | Admit: 2020-04-11 | Discharge: 2020-04-11 | Disposition: A | Discharge status: Home / Self Care | Payer: Medicaid Other | Source: Ambulatory Visit | Attending: Family Medicine | Admitting: Family Medicine

## 2020-04-11 ENCOUNTER — Other Ambulatory Visit: Payer: Self-pay

## 2020-04-11 DIAGNOSIS — M5412 Radiculopathy, cervical region: Secondary | ICD-10-CM

## 2020-04-12 ENCOUNTER — Telehealth: Payer: Self-pay | Admitting: Family Medicine

## 2020-04-12 NOTE — Telephone Encounter (Signed)
Patient called to update that the muscle relaxers did not help his pain. He states his pain increased when he took them.   He has a virtual visit scheduled on 6/24 to discuss MRI results

## 2020-04-13 ENCOUNTER — Other Ambulatory Visit: Payer: Self-pay

## 2020-04-13 ENCOUNTER — Telehealth (INDEPENDENT_AMBULATORY_CARE_PROVIDER_SITE_OTHER): Payer: Medicaid Other | Admitting: Family Medicine

## 2020-04-13 VITALS — Ht 71.0 in | Wt 218.0 lb

## 2020-04-13 DIAGNOSIS — M5412 Radiculopathy, cervical region: Secondary | ICD-10-CM | POA: Diagnosis not present

## 2020-04-13 DIAGNOSIS — M4802 Spinal stenosis, cervical region: Secondary | ICD-10-CM | POA: Diagnosis not present

## 2020-04-13 NOTE — Telephone Encounter (Signed)
Will discuss during virtual visit.   Rosemarie Ax, MD [facility] 04/13/2020, 8:35 AM

## 2020-04-13 NOTE — Progress Notes (Signed)
Virtual Visit via Telephone Note  I connected with Lance Cohen on 04/13/20 at  1:30 PM EDT by telephone and verified that I am speaking with the correct person using two identifiers.   I discussed the limitations, risks, security and privacy concerns of performing an evaluation and management service by telephone and the availability of in person appointments. I also discussed with the patient that there may be a patient responsible charge related to this service. The patient expressed understanding and agreed to proceed.  Patient: home  Physician: office   History of Present Illness:  Mr. Lance Cohen is a 49 yo M that is following up for his cervical MRI. It was demonstrating multiple level degenerative changes as well as congenital spinal stenosis.   Observations/Objective:   Assessment and Plan:  Cervical radiculopathy: MRI was demonstrating foraminal stenosis as well as congenital spinal stenosis. -Epidural  Follow Up Instructions:    I discussed the assessment and treatment plan with the patient. The patient was provided an opportunity to ask questions and all were answered. The patient agreed with the plan and demonstrated an understanding of the instructions.   The patient was advised to call back or seek an in-person evaluation if the symptoms worsen or if the condition fails to improve as anticipated.  I provided 5 minutes of non-face-to-face time during this encounter.   Clearance Coots, MD

## 2020-04-13 NOTE — Assessment & Plan Note (Signed)
MRI was demonstrating foraminal stenosis as well as congenital spinal stenosis. -Epidural

## 2020-04-27 ENCOUNTER — Ambulatory Visit
Admission: RE | Admit: 2020-04-27 | Discharge: 2020-04-27 | Disposition: A | Discharge status: Home / Self Care | Payer: Medicaid Other | Source: Ambulatory Visit | Attending: Family Medicine | Admitting: Family Medicine

## 2020-04-27 DIAGNOSIS — M5412 Radiculopathy, cervical region: Secondary | ICD-10-CM

## 2020-04-27 MED ORDER — IOPAMIDOL (ISOVUE-M 300) INJECTION 61%
1.0000 mL | Freq: Once | INTRAMUSCULAR | Status: AC
  Administered 2020-04-27: 1 mL via EPIDURAL

## 2020-04-27 MED ORDER — TRIAMCINOLONE ACETONIDE 40 MG/ML IJ SUSP (RADIOLOGY)
60.0000 mg | Freq: Once | INTRAMUSCULAR | Status: AC
  Administered 2020-04-27: 60 mg via EPIDURAL

## 2020-04-27 NOTE — Discharge Instructions (Signed)

## 2020-04-28 ENCOUNTER — Telehealth: Payer: Self-pay | Admitting: Family Medicine

## 2020-04-28 ENCOUNTER — Telehealth: Payer: Self-pay | Admitting: Nurse Practitioner

## 2020-04-28 NOTE — Telephone Encounter (Signed)
Patient called states he is hurting worst since  ESI injection rcvd 04/27/20 .  Please call him back @ [phone].  -Annabell Sabal ( glh)

## 2020-04-28 NOTE — Telephone Encounter (Signed)
Left VM for patient. If he calls back please have he speak with a nurse/CMA and inform that sometimes the steroid and the epidural injection can cause a steroid flare which can make the pain worse.  This should get better over the course of a few days.  Please keep me updated with his symptoms..   If any questions then please take the best time and phone number to call and I will try to call him back.   Rosemarie Ax, MD [facility] 04/28/2020, 1:13 PM

## 2020-04-28 NOTE — Telephone Encounter (Signed)
Phone call from pt to report increased pain s.p cervical epidural injection. Explained to pt that this can be expected and recommended applying heat or ice, resting and taking any OTC or prescribed pain medications that he is currently taking, per Dr. Nelia Shi. Pt became irate, complaining that no one will prescribe him pain medications. Before I could respond, the pt hung up the phone.

## 2020-05-11 DIAGNOSIS — M25519 Pain in unspecified shoulder: Secondary | ICD-10-CM | POA: Insufficient documentation

## 2020-05-11 DIAGNOSIS — M545 Low back pain, unspecified: Secondary | ICD-10-CM | POA: Insufficient documentation

## 2020-05-11 DIAGNOSIS — R519 Headache, unspecified: Secondary | ICD-10-CM | POA: Insufficient documentation

## 2020-05-11 DIAGNOSIS — G8929 Other chronic pain: Secondary | ICD-10-CM | POA: Insufficient documentation

## 2020-06-24 ENCOUNTER — Other Ambulatory Visit: Payer: Self-pay

## 2020-06-24 ENCOUNTER — Encounter (HOSPITAL_COMMUNITY): Payer: Self-pay | Admitting: *Deleted

## 2020-06-24 ENCOUNTER — Emergency Department (HOSPITAL_COMMUNITY)
Admission: EM | Admit: 2020-06-24 | Discharge: 2020-06-24 | Disposition: A | Discharge status: Home / Self Care | Payer: Medicaid Other | Attending: Emergency Medicine | Admitting: Emergency Medicine

## 2020-06-24 DIAGNOSIS — Z5321 Procedure and treatment not carried out due to patient leaving prior to being seen by health care provider: Secondary | ICD-10-CM | POA: Diagnosis not present

## 2020-06-24 DIAGNOSIS — R634 Abnormal weight loss: Secondary | ICD-10-CM | POA: Insufficient documentation

## 2020-06-24 DIAGNOSIS — R531 Weakness: Secondary | ICD-10-CM | POA: Diagnosis not present

## 2020-06-24 DIAGNOSIS — Z20822 Contact with and (suspected) exposure to covid-19: Secondary | ICD-10-CM | POA: Diagnosis not present

## 2020-06-24 LAB — SARS CORONAVIRUS 2 BY RT PCR (HOSPITAL ORDER, PERFORMED IN ~~LOC~~ HOSPITAL LAB): SARS Coronavirus 2: NEGATIVE

## 2020-06-24 NOTE — ED Triage Notes (Signed)
Weakness for 3 days, unable to eat, weight loss for months

## 2020-08-09 ENCOUNTER — Emergency Department (HOSPITAL_COMMUNITY)
Admission: EM | Admit: 2020-08-09 | Discharge: 2020-08-09 | Discharge status: Against Medical Advice | Payer: Medicaid Other | Attending: Emergency Medicine | Admitting: Emergency Medicine

## 2020-08-09 ENCOUNTER — Encounter (HOSPITAL_COMMUNITY): Payer: Self-pay | Admitting: Emergency Medicine

## 2020-08-09 ENCOUNTER — Emergency Department (HOSPITAL_COMMUNITY): Payer: Medicaid Other

## 2020-08-09 ENCOUNTER — Other Ambulatory Visit: Payer: Self-pay

## 2020-08-09 DIAGNOSIS — D689 Coagulation defect, unspecified: Secondary | ICD-10-CM | POA: Insufficient documentation

## 2020-08-09 DIAGNOSIS — Z5321 Procedure and treatment not carried out due to patient leaving prior to being seen by health care provider: Secondary | ICD-10-CM | POA: Diagnosis not present

## 2020-08-09 DIAGNOSIS — M79604 Pain in right leg: Secondary | ICD-10-CM | POA: Diagnosis not present

## 2020-08-09 NOTE — ED Notes (Signed)
Pt in bed, pt c/o R leg pain, states that his pain is from his groin to his knee, pt also c/o calf pain, pt has multiple contusion to his R calf. Pt has strong pedal pulses bilaterally, positive sensation to touch.  resps even and unlabored

## 2020-08-09 NOTE — ED Triage Notes (Signed)
C/o right leg pain.  Pt reports having blood clot for 2 years.  Rates pain 10/10.

## 2020-08-09 NOTE — ED Notes (Signed)
Patient was yelling and cussing at family on phone. Decided to leave AMA

## 2020-08-17 ENCOUNTER — Other Ambulatory Visit: Payer: Self-pay

## 2020-08-17 ENCOUNTER — Encounter (INDEPENDENT_AMBULATORY_CARE_PROVIDER_SITE_OTHER): Payer: Self-pay | Admitting: Nurse Practitioner

## 2020-08-17 ENCOUNTER — Ambulatory Visit (INDEPENDENT_AMBULATORY_CARE_PROVIDER_SITE_OTHER): Payer: Medicaid Other | Admitting: Nurse Practitioner

## 2020-08-17 VITALS — BP 117/75 | HR 76 | Resp 16 | Wt 215.0 lb

## 2020-08-17 DIAGNOSIS — Z716 Tobacco abuse counseling: Secondary | ICD-10-CM | POA: Diagnosis not present

## 2020-08-17 DIAGNOSIS — I10 Essential (primary) hypertension: Secondary | ICD-10-CM

## 2020-08-17 DIAGNOSIS — I82511 Chronic embolism and thrombosis of right femoral vein: Secondary | ICD-10-CM | POA: Diagnosis not present

## 2020-08-17 DIAGNOSIS — M79604 Pain in right leg: Secondary | ICD-10-CM | POA: Diagnosis not present

## 2020-08-17 DIAGNOSIS — N5319 Other ejaculatory dysfunction: Secondary | ICD-10-CM

## 2020-08-17 NOTE — Progress Notes (Signed)
Subjective:    Patient ID: Lance Cohen, male    DOB: Dec 21, 1970, 49 y.o.   MRN: [id number] Chief Complaint  Patient presents with  . Follow-up    [facility] follow up    The patient presents today as a DVT follow-up.  Lance Cohen is a 49 year old male who is well-known to our practice.  The patient has previous history of a mural thrombus in his right femoral vein.  This has been chronic for several years.  The patient has also been compliant on his Xarelto.  Recently the patient began to have severe right lower extremity leg pain.  The patient notes that it starts from his buttocks and goes down his leg.  He describes it almost as an electrical pain and an aching sensation.  He also notes that it tends to be worse when he lays down he has difficulty with getting up.  The patient also has known back issues and currently has back pain at this time.  He is also concerned about his sexual health.  The patient notes that he is able to achieve an erection however he is unable to ejaculate.  He notes that when he is able to ejaculate it is painful.  He is concerned that this is related to his thrombus.  The patient had a DVT study done in the hospital and it was suggestive of a possible new peroneal thrombus however independent review shows no different from previous studies.  All evidence of thrombus is chronic.   Review of Systems  Cardiovascular: Positive for leg swelling.  Musculoskeletal: Positive for arthralgias, back pain and gait problem.  All other systems reviewed and are negative.      Objective:   Physical Exam Vitals reviewed.  HENT:     Head: Normocephalic.  Cardiovascular:     Rate and Rhythm: Normal rate.     Pulses: Normal pulses.  Pulmonary:     Effort: Pulmonary effort is normal.  Skin:    General: Skin is warm and dry.     Capillary Refill: Capillary refill takes less than 2 seconds.  Neurological:     Mental Status: He is alert and oriented to person, place, and  time.  Psychiatric:        Mood and Affect: Mood is anxious.        Behavior: Behavior normal.        Thought Content: Thought content normal.        Judgment: Judgment normal.     BP 117/75 (BP Location: Right Arm)   Pulse 76   Resp 16   Wt 215 lb (97.5 kg)   BMI 29.99 kg/m   Past Medical History:  Diagnosis Date  . Arthritis   . Asthma   . Bipolar 1 disorder ([place])   . Chronic knee pain   . DVT (deep venous thrombosis) ([place])   . Hypertension   . Insomnia   . Suicide attempt [facility])    2010 Intentional overdose attempt with Depakote.    Social History   Socioeconomic History  . Marital status: Married    Spouse name: Not on file  . Number of children: Not on file  . Years of education: Not on file  . Highest education level: Not on file  Occupational History  . Not on file  Tobacco Use  . Smoking status: Current Every Day Smoker    Packs/day: 0.50    Types: Cigarettes  . Smokeless tobacco: Never Used  Vaping  Use  . Vaping Use: Never used  Substance and Sexual Activity  . Alcohol use: Yes    Comment: occasionally: on weekends (typically a pint on the weekend)  . Drug use: Yes    Types: Marijuana    Comment: smokes for pain  . Sexual activity: Yes  Other Topics Concern  . Not on file  Social History Narrative  . Not on file   Social Determinants of Health   Financial Resource Strain:   . Difficulty of Paying Living Expenses: Not on file  Food Insecurity:   . Worried About Charity fundraiser in the Last Year: Not on file  . Ran Out of Food in the Last Year: Not on file  Transportation Needs:   . Lack of Transportation (Medical): Not on file  . Lack of Transportation (Non-Medical): Not on file  Physical Activity:   . Days of Exercise per Week: Not on file  . Minutes of Exercise per Session: Not on file  Stress:   . Feeling of Stress : Not on file  Social Connections:   . Frequency of Communication with Friends and Family: Not on file  . Frequency  of Social Gatherings with Friends and Family: Not on file  . Attends Religious Services: Not on file  . Active Member of Clubs or Organizations: Not on file  . Attends Archivist Meetings: Not on file  . Marital Status: Not on file  Intimate Partner Violence:   . Fear of Current or Ex-Partner: Not on file  . Emotionally Abused: Not on file  . Physically Abused: Not on file  . Sexually Abused: Not on file    Past Surgical History:  Procedure Laterality Date  . COLONOSCOPY WITH PROPOFOL N/A 03/18/2019   Procedure: COLONOSCOPY WITH PROPOFOL;  Surgeon: [name], MD;  [facility];  Service: Endoscopy;  Laterality: N/A;  2:30pm  . FOOT SURGERY     had peice of wire removed  . IVC FILTER REMOVAL N/A 02/24/2017   Procedure: IVC Filter Removal;  Surgeon: [name], MD;  Location: [place] [facility];  Service: Cardiovascular;  Laterality: N/A;  . PERIPHERAL VASCULAR CATHETERIZATION Right 10/22/2016   Procedure: Thrombectomy, thrombolyisis;  Surgeon: [name], MD;  Location: [place] [facility];  Service: Cardiovascular;  Laterality: Right;  . PERIPHERAL VASCULAR CATHETERIZATION N/A 10/22/2016   Procedure: IVC Filter Insertion;  Surgeon: [name], MD;  Location: [place] [facility];  Service: Cardiovascular;  Laterality: N/A;  . POLYPECTOMY  03/18/2019   Procedure: POLYPECTOMY;  Surgeon: [name], MD;  [facility];  Service: Endoscopy;;    Family History  Problem Relation Age of Onset  . Cancer Mother   . Heart attack Father   . Colon cancer Neg Hx     Allergies  Allergen Reactions  . Bee Venom Swelling  . Penicillins Other (See Comments)    PT states that he had symptoms that felt like he was having a heart attack Has patient had a PCN reaction causing immediate rash, facial/tongue/throat swelling, SOB or lightheadedness with hypotension: No Has patient had a PCN reaction causing severe rash involving mucus membranes or  skin necrosis: No Has patient had a PCN reaction that required hospitalization No Has patient had a PCN reaction occurring within the last 10 years: No If all of the above answers are "NO", then may proceed with Cephal  . Sulfa Antibiotics Itching  Assessment & Plan:   1. Chronic deep vein thrombosis (DVT) of femoral vein of right lower extremity (HCC) Noninvasive studies done in the emergency room suggest there could be thrombus within the peroneal vein.  Independent review by myself show no evidence of acute thrombotic event.  The thrombus that is present is chronic.  Patient will remain on Xarelto.  2. Pain of right lower extremity Based upon the patient's description of symptoms as well as the timing of symptoms it is not likely that this is related to postphlebitic syndrome.  The patient's description of pain and sound highly suspicious for possible sciatic pain or pain related to his spinal stenosis.  The patient has remained on anticoagulation and does not experience a significant amounts of swelling.  Discussed with patient possible venogram however it is not likely that it would result in an intervention that would significantly improve the patient's pain.  The patient also notes that he has an upcoming follow-up with his orthopedic doctor.  We will have the patient return in 6 months following orthopedic interventions to see if this is helped relieve the pain or if further exploration of chronic thrombus may be warranted.  3. Hypertension, unspecified type Continue antihypertensive medications as already ordered, these medications have been reviewed and there are no changes at this time.   4. Tobacco abuse counseling Smoking cessation was discussed, 3-10 minutes spent on this topic specifically   5. Abnormal ejaculation While the patient is able to achieve an erection he is unable to ejaculate.  This would not be caused by his chronic thrombus in his femoral vein.  Will refer  patient to urology for further work-up. - Ambulatory referral to Urology   Current Outpatient Medications on File Prior to Visit  Medication Sig Dispense Refill  . amLODipine (NORVASC) 10 MG tablet Take 10 mg by mouth daily.    . diclofenac Sodium (VOLTAREN) 1 % GEL Apply 4 g topically 4 (four) times daily. 150 g 0  . doxepin (SINEQUAN) 10 MG capsule Take 10 mg by mouth daily.     Marland Kitchen gabapentin (NEURONTIN) 300 MG capsule Take 1 capsule (300 mg total) by mouth 3 (three) times daily as needed. 90 capsule 1  . hydrOXYzine (ATARAX/VISTARIL) 50 MG tablet Take 50 mg by mouth 3 (three) times daily as needed.    . indomethacin (INDOCIN) 25 MG capsule Take 25 mg by mouth 2 (two) times daily with a meal.    . lisinopril (ZESTRIL) 10 MG tablet Take 10 mg by mouth daily.    . meclizine (ANTIVERT) 25 MG tablet Take 1 tablet (25 mg total) by mouth 3 (three) times daily as needed for dizziness. 30 tablet 0  . methocarbamol (ROBAXIN) 500 MG tablet Take 1,000 mg by mouth 2 (two) times daily as needed.     Marland Kitchen omeprazole (PRILOSEC) 40 MG capsule Take 1 capsule (40 mg total) by mouth daily. 30 capsule 2  . ondansetron (ZOFRAN ODT) 4 MG disintegrating tablet Take 1 tablet (4 mg total) by mouth every 8 (eight) hours as needed for nausea or vomiting. 20 tablet 0  . predniSONE (DELTASONE) 5 MG tablet Take 6 pills for first day, 5 pills second day, 4 pills third day, 3 pills fourth day, 2 pills the fifth day, and 1 pill sixth day. 21 tablet 0  . rivaroxaban (XARELTO) 20 MG TABS tablet Take 20 mg by mouth daily with supper.    . traMADol (ULTRAM) 50 MG tablet Take 100 mg by mouth  every 8 (eight) hours as needed.    . cetirizine (ZYRTEC) 5 MG tablet Take 1 tablet (5 mg total) by mouth daily for 7 days. 7 tablet 0  . diphenhydrAMINE (BENADRYL) 25 MG tablet Take 1 tablet (25 mg total) by mouth every 6 (six) hours as needed for up to 7 days. 24 tablet 0   No current facility-administered medications on file prior to visit.      There are no Patient Instructions on file for this visit. No follow-ups on file.   Kris Hartmann, NP

## 2020-08-21 ENCOUNTER — Ambulatory Visit: Payer: Medicaid Other | Admitting: Family Medicine

## 2020-08-21 NOTE — Progress Notes (Deleted)
Lance Cohen - 49 y.o. male MRN [id number]  Date of birth: [birth year]  SUBJECTIVE:  Including CC & ROS.  No chief complaint on file.   Lance Cohen is a 49 y.o. male that is  ***.  ***   Review of Systems See HPI   HISTORY: Past Medical, Surgical, Social, and Family History Reviewed & Updated per EMR.   Pertinent Historical Findings include:  Past Medical History:  Diagnosis Date  . Arthritis   . Asthma   . Bipolar 1 disorder ([place])   . Chronic knee pain   . DVT (deep venous thrombosis) ([place])   . Hypertension   . Insomnia   . Suicide attempt [facility])    2010 Intentional overdose attempt with Depakote.    Past Surgical History:  Procedure Laterality Date  . COLONOSCOPY WITH PROPOFOL N/A 03/18/2019   Procedure: COLONOSCOPY WITH PROPOFOL;  Surgeon: [name], MD;  [facility];  Service: Endoscopy;  Laterality: N/A;  2:30pm  . FOOT SURGERY     had peice of wire removed  . IVC FILTER REMOVAL N/A 02/24/2017   Procedure: IVC Filter Removal;  Surgeon: [name], MD;  Location: [place] [facility];  Service: Cardiovascular;  Laterality: N/A;  . PERIPHERAL VASCULAR CATHETERIZATION Right 10/22/2016   Procedure: Thrombectomy, thrombolyisis;  Surgeon: [name], MD;  Location: [place] [facility];  Service: Cardiovascular;  Laterality: Right;  . PERIPHERAL VASCULAR CATHETERIZATION N/A 10/22/2016   Procedure: IVC Filter Insertion;  Surgeon: [name], MD;  Location: [place] [facility];  Service: Cardiovascular;  Laterality: N/A;  . POLYPECTOMY  03/18/2019   Procedure: POLYPECTOMY;  Surgeon: [name], MD;  [facility];  Service: Endoscopy;;    Family History  Problem Relation Age of Onset  . Cancer Mother   . Heart attack Father   . Colon cancer Neg Hx     Social History   Socioeconomic History  . Marital status: Married    Spouse name: Not on file  . Number of children: Not on file  . Years of education: Not on file    . Highest education level: Not on file  Occupational History  . Not on file  Tobacco Use  . Smoking status: Current Every Day Smoker    Packs/day: 0.50    Types: Cigarettes  . Smokeless tobacco: Never Used  Vaping Use  . Vaping Use: Never used  Substance and Sexual Activity  . Alcohol use: Yes    Comment: occasionally: on weekends (typically a pint on the weekend)  . Drug use: Yes    Types: Marijuana    Comment: smokes for pain  . Sexual activity: Yes  Other Topics Concern  . Not on file  Social History Narrative  . Not on file   Social Determinants of Health   Financial Resource Strain:   . Difficulty of Paying Living Expenses: Not on file  Food Insecurity:   . Worried About Charity fundraiser in the Last Year: Not on file  . Ran Out of Food in the Last Year: Not on file  Transportation Needs:   . Lack of Transportation (Medical): Not on file  . Lack of Transportation (Non-Medical): Not on file  Physical Activity:   . Days of Exercise per Week: Not on file  . Minutes of Exercise per Session: Not on file  Stress:   . Feeling of Stress : Not on file  Social Connections:   .  Frequency of Communication with Friends and Family: Not on file  . Frequency of Social Gatherings with Friends and Family: Not on file  . Attends Religious Services: Not on file  . Active Member of Clubs or Organizations: Not on file  . Attends Archivist Meetings: Not on file  . Marital Status: Not on file  Intimate Partner Violence:   . Fear of Current or Ex-Partner: Not on file  . Emotionally Abused: Not on file  . Physically Abused: Not on file  . Sexually Abused: Not on file     PHYSICAL EXAM:  VS: There were no vitals taken for this visit. Physical Exam Gen: NAD, alert, cooperative with exam, well-appearing MSK:  ***      ASSESSMENT & PLAN:   No problem-specific Assessment & Plan notes found for this encounter.

## 2020-08-23 ENCOUNTER — Ambulatory Visit: Payer: Self-pay | Admitting: Urology

## 2020-08-30 ENCOUNTER — Ambulatory Visit: Payer: Self-pay | Admitting: Urology

## 2020-09-04 ENCOUNTER — Ambulatory Visit: Payer: Medicaid Other | Admitting: Family Medicine

## 2020-09-06 ENCOUNTER — Other Ambulatory Visit: Payer: Self-pay

## 2020-09-06 ENCOUNTER — Ambulatory Visit (INDEPENDENT_AMBULATORY_CARE_PROVIDER_SITE_OTHER): Payer: Medicaid Other | Admitting: Urology

## 2020-09-06 ENCOUNTER — Encounter: Payer: Self-pay | Admitting: Urology

## 2020-09-06 VITALS — BP 112/70 | HR 74 | Ht 71.0 in | Wt 215.0 lb

## 2020-09-06 DIAGNOSIS — F5232 Male orgasmic disorder: Secondary | ICD-10-CM | POA: Diagnosis not present

## 2020-09-06 NOTE — Progress Notes (Signed)
09/06/2020 9:31 AM   Lance Cohen Nov 05, 1970 [id number]  Referring provider: Kris Hartmann, NP [name],  [name] [address]  Chief Complaint  Patient presents with  . New Patient (Initial Visit)    HPI: Lance Cohen is a 49 y.o. male seen at the request of [name], NP for evaluation of ejaculatory dysfunction.   1 year history of difficulty achieving orgasm  Typically has intercourse 3 times weekly and is able to achieve orgasm every 2 weeks  Has more difficulty when laying then standing  No pain with ejaculation  No SSRI medications  History of bipolar disorder though he has discontinued this medication along with several other medications  Has not seen any improvement in symptoms with discontinuing several medications  Presently only taking gabapentin, Xarelto and tramadol  History of spinal stenosis  Does have tiredness/fatigue but libido good   PMH: Past Medical History:  Diagnosis Date  . Arthritis   . Asthma   . Bipolar 1 disorder ([place])   . Chronic knee pain   . DVT (deep venous thrombosis) ([place])   . Hypertension   . Insomnia   . Suicide attempt [name])    2010 Intentional overdose attempt with Depakote.    Surgical History: Past Surgical History:  Procedure Laterality Date  . COLONOSCOPY WITH PROPOFOL N/A 03/18/2019   Procedure: COLONOSCOPY WITH PROPOFOL;  Surgeon: [name], MD;  [facility];  Service: Endoscopy;  Laterality: N/A;  2:30pm  . FOOT SURGERY     had peice of wire removed  . IVC FILTER REMOVAL N/A 02/24/2017   Procedure: IVC Filter Removal;  Surgeon: [name], MD;  Location: [place] [facility];  Service: Cardiovascular;  Laterality: N/A;  . PERIPHERAL VASCULAR CATHETERIZATION Right 10/22/2016   Procedure: Thrombectomy, thrombolyisis;  Surgeon: [name], MD;  Location: [place] [facility];  Service: Cardiovascular;  Laterality: Right;  . PERIPHERAL VASCULAR CATHETERIZATION N/A 10/22/2016    Procedure: IVC Filter Insertion;  Surgeon: [name], MD;  Location: [place] [facility];  Service: Cardiovascular;  Laterality: N/A;  . POLYPECTOMY  03/18/2019   Procedure: POLYPECTOMY;  Surgeon: [name], MD;  [facility];  Service: Endoscopy;;    Home Medications:  Allergies as of 09/06/2020      Reactions   Bee Venom Swelling   Penicillins Other (See Comments)   PT states that he had symptoms that felt like he was having a heart attack Has patient had a PCN reaction causing immediate rash, facial/tongue/throat swelling, SOB or lightheadedness with hypotension: No Has patient had a PCN reaction causing severe rash involving mucus membranes or skin necrosis: No Has patient had a PCN reaction that required hospitalization No Has patient had a PCN reaction occurring within the last 10 years: No If all of the above answers are "NO", then may proceed with Cephal   Sulfa Antibiotics Itching      Medication List       Accurate as of September 06, 2020  9:31 AM. If you have any questions, ask your nurse or doctor.        STOP taking these medications   amLODipine 10 MG tablet Commonly known as: NORVASC Stopped by: [name], MD   cetirizine 5 MG tablet Commonly known as: ZYRTEC Stopped by: [name], MD   diclofenac Sodium 1 % Gel Commonly known as: Voltaren Stopped by: [name], MD   diphenhydrAMINE 25 MG  tablet Commonly known as: BENADRYL Stopped by: [name], MD   doxepin 10 MG capsule Commonly known as: SINEQUAN Stopped by: [name], MD   hydrOXYzine 50 MG tablet Commonly known as: ATARAX/VISTARIL Stopped by: [name], MD   indomethacin 25 MG capsule Commonly known as: INDOCIN Stopped by: [name], MD   lisinopril 10 MG tablet Commonly known as: ZESTRIL Stopped by: [name], MD   meclizine 25 MG tablet Commonly known as: ANTIVERT Stopped by: [name], MD     methocarbamol 500 MG tablet Commonly known as: ROBAXIN Stopped by: [name], MD   omeprazole 40 MG capsule Commonly known as: PRILOSEC Stopped by: [name], MD   ondansetron 4 MG disintegrating tablet Commonly known as: Zofran ODT Stopped by: [name], MD   predniSONE 5 MG tablet Commonly known as: DELTASONE Stopped by: [name], MD     TAKE these medications   gabapentin 300 MG capsule Commonly known as: NEURONTIN Take 1 capsule (300 mg total) by mouth 3 (three) times daily as needed.   rivaroxaban 20 MG Tabs tablet Commonly known as: XARELTO Take 20 mg by mouth daily with supper.   traMADol 50 MG tablet Commonly known as: ULTRAM Take 100 mg by mouth every 8 (eight) hours as needed.       Allergies:  Allergies  Allergen Reactions  . Bee Venom Swelling  . Penicillins Other (See Comments)    PT states that he had symptoms that felt like he was having a heart attack Has patient had a PCN reaction causing immediate rash, facial/tongue/throat swelling, SOB or lightheadedness with hypotension: No Has patient had a PCN reaction causing severe rash involving mucus membranes or skin necrosis: No Has patient had a PCN reaction that required hospitalization No Has patient had a PCN reaction occurring within the last 10 years: No If all of the above answers are "NO", then may proceed with Cephal  . Sulfa Antibiotics Itching    Family History: Family History  Problem Relation Age of Onset  . Cancer Mother   . Heart attack Father   . Colon cancer Neg Hx     Social History:  reports that he has been smoking cigarettes. He has been smoking about 0.50 packs per day. He has never used smokeless tobacco. He reports current alcohol use. He reports current drug use. Drug: Marijuana.   Physical Exam: BP 112/70   Pulse 74   Ht 5\' 11"  (1.803 m)   Wt 215 lb (97.5 kg)   BMI 29.99 kg/m   Constitutional:  Alert, No acute distress. HEENT: [place] AT,  moist mucus membranes.  Trachea midline, no masses. Cardiovascular: No clubbing, cyanosis, or edema. Respiratory: Normal respiratory effort, no increased work of breathing. GI: Abdomen is soft, nontender, nondistended, no abdominal masses GU: Phallus circumcised without lesions, testes descended bilaterally without masses or tenderness   Assessment & Plan:    1.  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

## 2020-09-07 ENCOUNTER — Telehealth: Payer: Self-pay | Admitting: *Deleted

## 2020-09-07 LAB — TESTOSTERONE: Testosterone: 532 ng/dL (ref 264–916)

## 2020-09-07 LAB — LUTEINIZING HORMONE: LH: 4.5 m[IU]/mL (ref 1.7–8.6)

## 2020-09-07 NOTE — Telephone Encounter (Signed)
Notified patient as instructed, patient pleased °

## 2020-09-07 NOTE — Telephone Encounter (Signed)
-----   Message from [name], MD sent at 09/07/2020 11:07 AM EST ----- Testosterone level was normal at 532.  His ejaculation problems are most likely related to nerve injury and unfortunately there is no treatment to improve

## 2020-09-08 ENCOUNTER — Ambulatory Visit (INDEPENDENT_AMBULATORY_CARE_PROVIDER_SITE_OTHER): Payer: Medicaid Other | Admitting: Family Medicine

## 2020-09-08 ENCOUNTER — Other Ambulatory Visit: Payer: Self-pay

## 2020-09-08 ENCOUNTER — Ambulatory Visit: Payer: Self-pay

## 2020-09-08 VITALS — BP 108/70 | Ht 71.0 in | Wt 215.0 lb

## 2020-09-08 DIAGNOSIS — M5412 Radiculopathy, cervical region: Secondary | ICD-10-CM

## 2020-09-08 DIAGNOSIS — M7541 Impingement syndrome of right shoulder: Secondary | ICD-10-CM | POA: Diagnosis not present

## 2020-09-08 DIAGNOSIS — M795 Residual foreign body in soft tissue: Secondary | ICD-10-CM | POA: Diagnosis not present

## 2020-09-08 DIAGNOSIS — M25561 Pain in right knee: Secondary | ICD-10-CM

## 2020-09-08 MED ORDER — TRIAMCINOLONE ACETONIDE 40 MG/ML IJ SUSP
40.0000 mg | Freq: Once | INTRAMUSCULAR | Status: AC
Start: 2020-09-08 — End: 2020-09-08
  Administered 2020-09-08: 40 mg via INTRA_ARTICULAR

## 2020-09-08 NOTE — Progress Notes (Signed)
Lance Cohen - 49 y.o. male MRN [id number]  Date of birth: Jun 26, 1971  SUBJECTIVE:  Including CC & ROS.  No chief complaint on file.   Lance Cohen is a 49 y.o. male that is presenting with right shoulder pain that is acute on chronic.  Also reporting is radicular pain.  Has interested in getting the foreign body removed from his right knee.  He also has pain that is generalized in nature.   Review of Systems See HPI   HISTORY: Past Medical, Surgical, Social, and Family History Reviewed & Updated per EMR.   Pertinent Historical Findings include:  Past Medical History:  Diagnosis Date  . Arthritis   . Asthma   . Bipolar 1 disorder ([place])   . Chronic knee pain   . DVT (deep venous thrombosis) ([place])   . Hypertension   . Insomnia   . Suicide attempt [facility])    2010 Intentional overdose attempt with Depakote.    Past Surgical History:  Procedure Laterality Date  . COLONOSCOPY WITH PROPOFOL N/A 03/18/2019   Procedure: COLONOSCOPY WITH PROPOFOL;  Surgeon: [name], MD;  [facility];  Service: Endoscopy;  Laterality: N/A;  2:30pm  . FOOT SURGERY     had peice of wire removed  . IVC FILTER REMOVAL N/A 02/24/2017   Procedure: IVC Filter Removal;  Surgeon: [name], MD;  Location: [place] [facility];  Service: Cardiovascular;  Laterality: N/A;  . PERIPHERAL VASCULAR CATHETERIZATION Right 10/22/2016   Procedure: Thrombectomy, thrombolyisis;  Surgeon: [name], MD;  Location: [place] [facility];  Service: Cardiovascular;  Laterality: Right;  . PERIPHERAL VASCULAR CATHETERIZATION N/A 10/22/2016   Procedure: IVC Filter Insertion;  Surgeon: [name], MD;  Location: [place] [facility];  Service: Cardiovascular;  Laterality: N/A;  . POLYPECTOMY  03/18/2019   Procedure: POLYPECTOMY;  Surgeon: [name], MD;  [facility];  Service: Endoscopy;;    Family History  Problem Relation Age of Onset  . Cancer Mother   . Heart attack  Father   . Colon cancer Neg Hx     Social History   Socioeconomic History  . Marital status: Married    Spouse name: Not on file  . Number of children: Not on file  . Years of education: Not on file  . Highest education level: Not on file  Occupational History  . Not on file  Tobacco Use  . Smoking status: Current Every Day Smoker    Packs/day: 0.50    Types: Cigarettes  . Smokeless tobacco: Never Used  Vaping Use  . Vaping Use: Never used  Substance and Sexual Activity  . Alcohol use: Yes    Comment: occasionally: on weekends (typically a pint on the weekend)  . Drug use: Yes    Types: Marijuana    Comment: smokes for pain  . Sexual activity: Yes  Other Topics Concern  . Not on file  Social History Narrative  . Not on file   Social Determinants of Health   Financial Resource Strain:   . Difficulty of Paying Living Expenses: Not on file  Food Insecurity:   . Worried About Charity fundraiser in the Last Year: Not on file  . Ran Out of Food in the Last Year: Not on file  Transportation Needs:   . Lack of Transportation (Medical): Not on file  . Lack of Transportation (Non-Medical): Not on file  Physical Activity:   . Days  of Exercise per Week: Not on file  . Minutes of Exercise per Session: Not on file  Stress:   . Feeling of Stress : Not on file  Social Connections:   . Frequency of Communication with Friends and Family: Not on file  . Frequency of Social Gatherings with Friends and Family: Not on file  . Attends Religious Services: Not on file  . Active Member of Clubs or Organizations: Not on file  . Attends Archivist Meetings: Not on file  . Marital Status: Not on file  Intimate Partner Violence:   . Fear of Current or Ex-Partner: Not on file  . Emotionally Abused: Not on file  . Physically Abused: Not on file  . Sexually Abused: Not on file     PHYSICAL EXAM:  VS: BP 108/70   Ht 5\' 11"  (1.803 m)   Wt 215 lb (97.5 kg)   BMI 29.99 kg/m    Physical Exam Gen: NAD, alert, cooperative with exam, well-appearing MSK:  Right shoulder: Normal range of motion. Normal strength resistance. Some pain with external rotation and abduction. Positive can test. Neurovascular intact   Aspiration/Injection Procedure Note Lance Cohen [birth year]  Procedure: Injection Indications: Right shoulder pain  Procedure Details Consent: Risks of procedure as well as the alternatives and risks of each were explained to the (patient/caregiver).  Consent for procedure obtained. Time Out: Verified patient identification, verified procedure, site/side was marked, verified correct patient position, special equipment/implants available, medications/allergies/relevent history reviewed, required imaging and test results available.  Performed.  The area was cleaned with iodine and alcohol swabs.    The right subacromial space was injected using 1 cc's of 40 mg Kenalog and 4 cc's of 0.25% bupivacaine with a 25 1 1/2" needle.  Ultrasound was used. Images were obtained in long views showing the injection.     A sterile dressing was applied.  Patient did tolerate procedure well.     ASSESSMENT & PLAN:   Cervical radiculopathy Still gets pain around the neck and radicular in nature.  Has tried a epidural injection with limited improvement. -Counseled on home exercise therapy and supportive care. -B12 and folate  Arthralgia of right knee Intermittent joint pain in the knee as well as other joints.  Does have a history of elevated uric acid. -TSH, ANA, sed rate, CRP, uric acid.   Foreign body (FB) in soft tissue Has Impella in his soft tissue from previous injury.  Has been seeing observed on x-ray.  He would like to have it removed. -Referral to orthopedic surgery for removal.  Impingement syndrome of right shoulder Acute on chronic in nature.  Seems more impingement as opposed to joint related at this time. -Counseled on home exercise  therapy and supportive care. -Injection. -Could consider further imaging if needed.

## 2020-09-08 NOTE — Assessment & Plan Note (Signed)
Acute on chronic in nature.  Seems more impingement as opposed to joint related at this time. -Counseled on home exercise therapy and supportive care. -Injection. -Could consider further imaging if needed.

## 2020-09-08 NOTE — Assessment & Plan Note (Signed)
Has Impella in his soft tissue from previous injury.  Has been seeing observed on x-ray.  He would like to have it removed. -Referral to orthopedic surgery for removal.

## 2020-09-08 NOTE — Patient Instructions (Signed)
Good to see you Please try ice on the shoulder  I will call with the results from today  The surgeon's office will call you about the BB   Please send me a message in MyChart with any questions or updates.  Please see me back in 4-6 weeks.   --Dr. Raeford Razor

## 2020-09-08 NOTE — Assessment & Plan Note (Signed)
Still gets pain around the neck and radicular in nature.  Has tried a epidural injection with limited improvement. -Counseled on home exercise therapy and supportive care. -B12 and folate

## 2020-09-08 NOTE — Assessment & Plan Note (Signed)
Intermittent joint pain in the knee as well as other joints.  Does have a history of elevated uric acid. -TSH, ANA, sed rate, CRP, uric acid.

## 2020-09-12 ENCOUNTER — Other Ambulatory Visit: Payer: Self-pay | Admitting: Family Medicine

## 2020-09-12 ENCOUNTER — Telehealth: Payer: Self-pay | Admitting: Family Medicine

## 2020-09-12 LAB — B12 AND FOLATE PANEL
Folate: 3.7 ng/mL (ref >3.0)
Vitamin B-12: 297 pg/mL (ref 232–1245)

## 2020-09-12 LAB — SEDIMENTATION RATE: Sed Rate: 6 mm/hr (ref 0–15)

## 2020-09-12 LAB — TSH: TSH: 2.28 u[IU]/mL (ref 0.450–4.500)

## 2020-09-12 LAB — URIC ACID: Uric Acid: 8.1 mg/dL (ref 3.8–8.4)

## 2020-09-12 LAB — ANA,IFA RA DIAG PNL W/RFLX TIT/PATN
ANA Titer 1: NEGATIVE
Cyclic Citrullin Peptide Ab: 10 units (ref 0–19)
Rheumatoid fact SerPl-aCnc: <10 IU/mL (ref 0.0–13.9)

## 2020-09-12 LAB — C-REACTIVE PROTEIN: CRP: 2 mg/L (ref 0–10)

## 2020-09-12 MED ORDER — PREDNISONE 5 MG PO TABS: ORAL_TABLET | ORAL | 0 refills | Status: DC

## 2020-09-12 MED ORDER — ALLOPURINOL 100 MG PO TABS: 100.0000 mg | ORAL_TABLET | Freq: Every day | ORAL | 3 refills | Status: DC

## 2020-09-12 NOTE — Progress Notes (Signed)
Initiate allopurinol for his elevated uric acid.   Rosemarie Ax, MD [facility] 09/12/2020, 4:33 PM

## 2020-09-12 NOTE — Telephone Encounter (Signed)
Left VM for patient. If he calls back please have him speak with a nurse/CMA and inform that his uric acid was elevated. He may have underlying gout that is leading to his pain. We could consider starting a medicine for this to see if his symptoms improve. His other labs were normal.   If any questions then please take the best time and phone number to call and I will try to call him back.   Rosemarie Ax, MD [facility] 09/12/2020, 3:23 PM

## 2020-09-19 ENCOUNTER — Ambulatory Visit: Payer: Medicaid Other | Admitting: Orthopaedic Surgery

## 2020-10-06 ENCOUNTER — Ambulatory Visit: Payer: Medicaid Other | Admitting: Family Medicine

## 2020-10-06 NOTE — Progress Notes (Deleted)
  ALEXANDRU MOORER - 49 y.o. male MRN [id number]  Date of birth: August 22, 1971  SUBJECTIVE:  Including CC & ROS.  No chief complaint on file.   EROS MONTOUR is a 49 y.o. male that is  ***.  ***   Review of Systems See HPI   HISTORY: Past Medical, Surgical, Social, and Family History Reviewed & Updated per EMR.   Pertinent Historical Findings include:  Past Medical History:  Diagnosis Date  . Arthritis   . Asthma   . Bipolar 1 disorder ([place])   . Chronic knee pain   . DVT (deep venous thrombosis) ([place])   . Hypertension   . Insomnia   . Suicide attempt [facility])    2010 Intentional overdose attempt with Depakote.    Past Surgical History:  Procedure Laterality Date  . COLONOSCOPY WITH PROPOFOL N/A 03/18/2019   Procedure: COLONOSCOPY WITH PROPOFOL;  Surgeon: [name], MD;  [facility];  Service: Endoscopy;  Laterality: N/A;  2:30pm  . FOOT SURGERY     had peice of wire removed  . IVC FILTER REMOVAL N/A 02/24/2017   Procedure: IVC Filter Removal;  Surgeon: [name], MD;  Location: [place] [facility];  Service: Cardiovascular;  Laterality: N/A;  . PERIPHERAL VASCULAR CATHETERIZATION Right 10/22/2016   Procedure: Thrombectomy, thrombolyisis;  Surgeon: [name], MD;  Location: [place] [facility];  Service: Cardiovascular;  Laterality: Right;  . PERIPHERAL VASCULAR CATHETERIZATION N/A 10/22/2016   Procedure: IVC Filter Insertion;  Surgeon: [name], MD;  Location: [place] [facility];  Service: Cardiovascular;  Laterality: N/A;  . POLYPECTOMY  03/18/2019   Procedure: POLYPECTOMY;  Surgeon: [name], MD;  [facility];  Service: Endoscopy;;    Family History  Problem Relation Age of Onset  . Cancer Mother   . Heart attack Father   . Colon cancer Neg Hx     Social History   Socioeconomic History  . Marital status: Married    Spouse name: Not on file  . Number of children: Not on file  . Years of education: Not on file   . Highest education level: Not on file  Occupational History  . Not on file  Tobacco Use  . Smoking status: Current Every Day Smoker    Packs/day: 0.50    Types: Cigarettes  . Smokeless tobacco: Never Used  Vaping Use  . Vaping Use: Never used  Substance and Sexual Activity  . Alcohol use: Yes    Comment: occasionally: on weekends (typically a pint on the weekend)  . Drug use: Yes    Types: Marijuana    Comment: smokes for pain  . Sexual activity: Yes  Other Topics Concern  . Not on file  Social History Narrative  . Not on file   Social Determinants of Health   Financial Resource Strain: Not on file  Food Insecurity: Not on file  Transportation Needs: Not on file  Physical Activity: Not on file  Stress: Not on file  Social Connections: Not on file  Intimate Partner Violence: Not on file     PHYSICAL EXAM:  VS: There were no vitals taken for this visit. Physical Exam Gen: NAD, alert, cooperative with exam, well-appearing MSK:  ***      ASSESSMENT & PLAN:   No problem-specific Assessment & Plan notes found for this encounter.

## 2020-10-21 DIAGNOSIS — Z86718 Personal history of other venous thrombosis and embolism: Secondary | ICD-10-CM

## 2020-10-21 DIAGNOSIS — M4800 Spinal stenosis, site unspecified: Secondary | ICD-10-CM

## 2020-10-21 HISTORY — DX: Personal history of other venous thrombosis and embolism: Z86.718

## 2020-10-21 HISTORY — DX: Spinal stenosis, site unspecified: M48.00

## 2020-11-13 ENCOUNTER — Ambulatory Visit: Payer: Medicaid Other | Admitting: Family Medicine

## 2020-11-15 ENCOUNTER — Ambulatory Visit: Payer: Medicaid Other | Admitting: Family Medicine

## 2020-11-15 NOTE — Progress Notes (Deleted)
  Lance Cohen - 49 y.o. male MRN [id number]  Date of birth: 07/08/1971  SUBJECTIVE:  Including CC & ROS.  No chief complaint on file.   Lance Cohen is a 49 y.o. male that is  ***.  ***   Review of Systems See HPI   HISTORY: Past Medical, Surgical, Social, and Family History Reviewed & Updated per EMR.   Pertinent Historical Findings include:  Past Medical History:  Diagnosis Date  . Arthritis   . Asthma   . Bipolar 1 disorder ([facility])   . Chronic knee pain   . DVT (deep venous thrombosis) ([facility])   . Hypertension   . Insomnia   . Suicide attempt ([facility])    2010 Intentional overdose attempt with Depakote.    Past Surgical History:  Procedure Laterality Date  . COLONOSCOPY WITH PROPOFOL N/A 03/18/2019   Procedure: COLONOSCOPY WITH PROPOFOL;  Surgeon: Rourk, Robert M, MD;  [facility];  Service: Endoscopy;  Laterality: N/A;  2:30pm  . FOOT SURGERY     had peice of wire removed  . IVC FILTER REMOVAL N/A 02/24/2017   Procedure: IVC Filter Removal;  Surgeon: Dew, Jason S, MD;  Location: [facility];  Service: Cardiovascular;  Laterality: N/A;  . PERIPHERAL VASCULAR CATHETERIZATION Right 10/22/2016   Procedure: Thrombectomy, thrombolyisis;  Surgeon: Gregory G Schnier, MD;  Location: [facility];  Service: Cardiovascular;  Laterality: Right;  . PERIPHERAL VASCULAR CATHETERIZATION N/A 10/22/2016   Procedure: IVC Filter Insertion;  Surgeon: Gregory G Schnier, MD;  Location: [facility];  Service: Cardiovascular;  Laterality: N/A;  . POLYPECTOMY  03/18/2019   Procedure: POLYPECTOMY;  Surgeon: Rourk, Robert M, MD;  [facility];  Service: Endoscopy;;    Family History  Problem Relation Age of Onset  . Cancer Mother   . Heart attack Father   . Colon cancer Neg Hx     Social History   Socioeconomic History  . Marital status: Married    Spouse name: Not on file  . Number of children: Not on file  . Years of education: Not on file   . Highest education level: Not on file  Occupational History  . Not on file  Tobacco Use  . Smoking status: Current Every Day Smoker    Packs/day: 0.50    Types: Cigarettes  . Smokeless tobacco: Never Used  Vaping Use  . Vaping Use: Never used  Substance and Sexual Activity  . Alcohol use: Yes    Comment: occasionally: on weekends (typically a pint on the weekend)  . Drug use: Yes    Types: Marijuana    Comment: smokes for pain  . Sexual activity: Yes  Other Topics Concern  . Not on file  Social History Narrative  . Not on file   Social Determinants of Health   Financial Resource Strain: Not on file  Food Insecurity: Not on file  Transportation Needs: Not on file  Physical Activity: Not on file  Stress: Not on file  Social Connections: Not on file  Intimate Partner Violence: Not on file     PHYSICAL EXAM:  VS: There were no vitals taken for this visit. Physical Exam Gen: NAD, alert, cooperative with exam, well-appearing MSK:  ***      ASSESSMENT & PLAN:   No problem-specific Assessment & Plan notes found for this encounter.     

## 2020-11-16 ENCOUNTER — Encounter (HOSPITAL_COMMUNITY): Payer: Self-pay | Admitting: *Deleted

## 2020-11-16 ENCOUNTER — Emergency Department (HOSPITAL_COMMUNITY): Payer: Medicaid Other

## 2020-11-16 ENCOUNTER — Emergency Department (HOSPITAL_COMMUNITY)
Admission: EM | Admit: 2020-11-16 | Discharge: 2020-11-16 | Disposition: A | Discharge status: Home / Self Care | Payer: Medicaid Other | Attending: Emergency Medicine | Admitting: Emergency Medicine

## 2020-11-16 DIAGNOSIS — S52301A Unspecified fracture of shaft of right radius, initial encounter for closed fracture: Secondary | ICD-10-CM | POA: Insufficient documentation

## 2020-11-16 DIAGNOSIS — S6991XA Unspecified injury of right wrist, hand and finger(s), initial encounter: Secondary | ICD-10-CM | POA: Diagnosis present

## 2020-11-16 DIAGNOSIS — W208XXA Other cause of strike by thrown, projected or falling object, initial encounter: Secondary | ICD-10-CM | POA: Diagnosis not present

## 2020-11-16 DIAGNOSIS — J45909 Unspecified asthma, uncomplicated: Secondary | ICD-10-CM | POA: Diagnosis not present

## 2020-11-16 DIAGNOSIS — F17210 Nicotine dependence, cigarettes, uncomplicated: Secondary | ICD-10-CM | POA: Diagnosis not present

## 2020-11-16 DIAGNOSIS — S52221A Displaced transverse fracture of shaft of right ulna, initial encounter for closed fracture: Secondary | ICD-10-CM | POA: Diagnosis not present

## 2020-11-16 DIAGNOSIS — I10 Essential (primary) hypertension: Secondary | ICD-10-CM | POA: Insufficient documentation

## 2020-11-16 DIAGNOSIS — Z7901 Long term (current) use of anticoagulants: Secondary | ICD-10-CM | POA: Diagnosis not present

## 2020-11-16 MED ORDER — HYDROCODONE-ACETAMINOPHEN 5-325 MG PO TABS
1.0000 | ORAL_TABLET | Freq: Once | ORAL | Status: AC
  Administered 2020-11-16: 1 via ORAL
  Filled 2020-11-16: qty 1

## 2020-11-16 MED ORDER — OXYCODONE-ACETAMINOPHEN 5-325 MG PO TABS
1.0000 | ORAL_TABLET | ORAL | Status: DC | PRN
  Administered 2020-11-16: 1 via ORAL
  Filled 2020-11-16: qty 1

## 2020-11-16 MED ORDER — OXYCODONE-ACETAMINOPHEN 5-325 MG PO TABS
1.0000 | ORAL_TABLET | Freq: Four times a day (QID) | ORAL | 0 refills | Status: DC | PRN
Start: 2020-11-16 — End: 2020-12-07

## 2020-11-16 NOTE — ED Notes (Signed)
Pt refused v/s..

## 2020-11-16 NOTE — Discharge Instructions (Addendum)
As discussed, you broke your arm. Keep the splint on and do not use your right arm until evaluated by the orthopedic surgeon.  Please call the orthopedic surgeon tomorrow to schedule an appointment for further evaluation.  I am sending you home with a pain medication.  Take as needed for severe pain.  Do not drive or operate machinery while on the pain medication. You may take over-the-counter ibuprofen or Tylenol as needed for mild to moderate pain.  Return to the ER for new or worsening symptoms.

## 2020-11-16 NOTE — ED Provider Notes (Signed)
[facility] EMERGENCY DEPARTMENT Provider Note   CSN: [id number] Arrival date & time: 11/16/20  1200     History Chief Complaint  Patient presents with  . Arm Pain    Lance Cohen is a 50 y.o. male with a past medical history significant for asthma, bipolar 1 disorder, history of DVT, and hypertension who presents to the ED due to right forearm pain.  Patient states a metal beam fell directly on his right forearm causing severe, 10/10 pain worse with movement. Pain radiates down entire right arm from elbow to hand. Right arm pain associated with numbness.  Patient states he believes he is had a previous broken bone in his right forearm.  He is right-hand dominant.  No treatment prior to arrival.  No other injuries.  History obtained from patient and past medical records. No interpreter used during encounter.      Past Medical History:  Diagnosis Date  . Arthritis   . Asthma   . Bipolar 1 disorder ([place])   . Chronic knee pain   . DVT (deep venous thrombosis) ([place])   . Hypertension   . Insomnia   . Suicide attempt [facility])    2010 Intentional overdose attempt with Depakote.    Patient Active Problem List   Diagnosis Date Noted  . Foreign body (FB) in soft tissue 09/08/2020  . Cervical radiculopathy 02/18/2020  . Impingement syndrome of right shoulder 01/18/2020  . Rectal pain 06/29/2019  . GERD (gastroesophageal reflux disease) 06/29/2019  . Rectal bleeding 02/22/2019  . Priapism 02/08/2019  . Chest pain 10/19/2017  . Asthma 10/19/2017  . Hypertension 10/19/2017  . Arthralgia of right knee 10/19/2017  . Pain in limb 12/27/2016  . Acute pulmonary embolism ([place]) 10/23/2016  . Nausea and vomiting 10/23/2016  . S/P IVC filter 10/23/2016  . Tobacco abuse counseling 10/23/2016  . DVT (deep venous thrombosis) ([place]) 10/18/2016  . Cellulitis of foot 06/24/2011  . Bipolar 1 disorder ([place]) 06/24/2011  . Tobacco abuse 06/24/2011  . Polycythemia 06/24/2011  . Tinea pedis  06/24/2011  . CARPAL TUNNEL SYNDROME, RIGHT 06/21/2010  . CONTUSION, LEFT FOOT 06/05/2010  . FOREIGN BODY, FOOT 04/17/2010    Past Surgical History:  Procedure Laterality Date  . COLONOSCOPY WITH PROPOFOL N/A 03/18/2019   Procedure: COLONOSCOPY WITH PROPOFOL;  Surgeon: [name], MD;  [facility];  Service: Endoscopy;  Laterality: N/A;  2:30pm  . FOOT SURGERY     had peice of wire removed  . IVC FILTER REMOVAL N/A 02/24/2017   Procedure: IVC Filter Removal;  Surgeon: [name], MD;  Location: [place] [facility];  Service: Cardiovascular;  Laterality: N/A;  . PERIPHERAL VASCULAR CATHETERIZATION Right 10/22/2016   Procedure: Thrombectomy, thrombolyisis;  Surgeon: [name], MD;  Location: [place] [facility];  Service: Cardiovascular;  Laterality: Right;  . PERIPHERAL VASCULAR CATHETERIZATION N/A 10/22/2016   Procedure: IVC Filter Insertion;  Surgeon: [name], MD;  Location: [place] [facility];  Service: Cardiovascular;  Laterality: N/A;  . POLYPECTOMY  03/18/2019   Procedure: POLYPECTOMY;  Surgeon: [name], MD;  [facility];  Service: Endoscopy;;       Family History  Problem Relation Age of Onset  . Cancer Mother   . Heart attack Father   . Colon cancer Neg Hx     Social History   Tobacco Use  . Smoking status: Current Every Day Smoker    Packs/day: 0.50    Types:  Cigarettes  . Smokeless tobacco: Never Used  Vaping Use  . Vaping Use: Never used  Substance Use Topics  . Alcohol use: Yes    Comment: occasionally: on weekends (typically a pint on the weekend)  . Drug use: Yes    Types: Marijuana    Comment: smokes for pain    Home Medications Prior to Admission medications   Medication Sig Start Date End Date Taking? Authorizing Provider  oxyCODONE-acetaminophen (PERCOCET/ROXICET) 5-325 MG tablet Take 1 tablet by mouth every 6 (six) hours as needed for severe pain. 11/16/20  Yes Maxemiliano Riel, Druscilla Brownie, PA-C   allopurinol (ZYLOPRIM) 100 MG tablet Take 1 tablet (100 mg total) by mouth daily. 09/12/20   Rosemarie Ax, MD  gabapentin (NEURONTIN) 300 MG capsule Take 1 capsule (300 mg total) by mouth 3 (three) times daily as needed. 02/18/20   Rosemarie Ax, MD  predniSONE (DELTASONE) 5 MG tablet Take 6 pills for first day, 5 pills second day, 4 pills third day, 3 pills fourth day, 2 pills the fifth day, and 1 pill sixth day. 09/12/20   Rosemarie Ax, MD  rivaroxaban (XARELTO) 20 MG TABS tablet Take 20 mg by mouth daily with supper.    [provider]  traMADol (ULTRAM) 50 MG tablet Take 100 mg by mouth every 8 (eight) hours as needed. 07/19/20   [provider]    Allergies    Bee venom, Penicillins, and Sulfa antibiotics  Review of Systems   Review of Systems  Musculoskeletal: Positive for arthralgias.  Neurological: Positive for numbness.  All other systems reviewed and are negative.   Physical Exam Updated Vital Signs BP 134/83 (BP Location: Left Arm)   Pulse 68   Temp 98.2 F (36.8 C) (Oral)   Resp 18   SpO2 95%   Physical Exam Vitals and nursing note reviewed.  Constitutional:      General: He is not in acute distress.    Appearance: He is not toxic-appearing.  HENT:     Head: Normocephalic.  Eyes:     Pupils: Pupils are equal, round, and reactive to light.  Cardiovascular:     Rate and Rhythm: Normal rate and regular rhythm.     Pulses: Normal pulses.     Heart sounds: Normal heart sounds. No murmur heard. No friction rub. No gallop.   Pulmonary:     Effort: Pulmonary effort is normal.     Breath sounds: Normal breath sounds.  Abdominal:     General: Abdomen is flat. There is no distension.     Palpations: Abdomen is soft.     Tenderness: There is no abdominal tenderness. There is no guarding or rebound.  Musculoskeletal:     Cervical back: Neck supple.     Comments: Noticeable deformity to mid right forearm.  Radial pulse intact.  Soft  compartments.  No tenderness over right elbow or wrist.  Motion of all right fingers.  Limited range of motion of right elbow and wrist due to forearm pain.  Skin:    General: Skin is warm and dry.  Neurological:     General: No focal deficit present.     Mental Status: He is alert.  Psychiatric:        Mood and Affect: Mood normal.        Behavior: Behavior normal.     ED Results / Procedures / Treatments   Labs (all labs ordered are listed, but only abnormal results are displayed) Labs Reviewed -  No data to display  EKG None  Radiology DG Forearm Right  Result Date: 11/16/2020 CLINICAL DATA:  50 year old male with right forearm pain after a large sheet of metal fell on his arm at work EXAM: RIGHT FOREARM - 2 VIEW COMPARISON:  None. FINDINGS: Acute mildly comminuted fractures transverse fractures through the mid diaphysis of the radius and ulna. The ulnar fracture is minimally displaced by approximately 3 mm. The radial fracture is more significantly displaced by approximately 1 cm with an additional 0.8 cm of fracture override. There is associated soft tissue swelling. The proximal and distal radioulnar joints appear to be intact. IMPRESSION: Acute both-bone forearm fracture. The ulnar fracture is minimally displaced while the radial fracture is moderately displaced as described above. Electronically Signed   By: Jacqulynn Cadet M.D.   On: 11/16/2020 14:02    Procedures Procedures   Medications Ordered in ED Medications  oxyCODONE-acetaminophen (PERCOCET/ROXICET) 5-325 MG per tablet 1 tablet (1 tablet Oral Given 11/16/20 1332)    ED Course  I have reviewed the triage vital signs and the nursing notes.  Pertinent labs & imaging results that were available during my care of the patient were reviewed by me and considered in my medical decision making (see chart for details).    MDM Rules/Calculators/A&P                         50 year old male presents to the ED due to right  forearm pain after a metal beam landed directly on it. He is right hand dominant.  No other injuries.  Upon arrival, stable vitals.  Patient in no acute distress and nontoxic-appearing.  Physical exam significant for noticeable deformity to mid right forearm.  Radial pulse intact.  Soft compartments.  Doubt compartment syndrome.  X-ray ordered at triage.  Patient given Percocet while in the waiting room.  X-ray personally reviewed which demonstrates: Acute mildly comminuted fractures transverse fractures through the  mid diaphysis of the radius and ulna. The ulnar fracture is  minimally displaced by approximately 3 mm. The radial fracture is  more significantly displaced by approximately 1 cm with an  additional 0.8 cm of fracture override. There is associated soft  tissue swelling. The proximal and distal radioulnar joints appear to  be intact.   Discussed case with Dr. Aline Brochure with orthopedics who recommends sugar tong splint with outpatient follow-up. Patient discharged with pain medication. Database reviewed prior to prescription.  Advised patient to take over-the-counter ibuprofen or Tylenol for mild to moderate pain and oxycodone for severe pain. Instructed patient not to drive after initiating her on the oxycodone. Strict ED precautions discussed with patient. Patient states understanding and agrees to plan. Patient discharged home in no acute distress and stable vitals. Final Clinical Impression(s) / ED Diagnoses Final diagnoses:  Closed fracture of shaft of right radius, unspecified fracture morphology, initial encounter  Closed displaced transverse fracture of shaft of right ulna, initial encounter    Rx / DC Orders ED Discharge Orders         Ordered    oxyCODONE-acetaminophen (PERCOCET/ROXICET) 5-325 MG tablet  Every 6 hours PRN        11/16/20 1601           Suzy Bouchard, PA-C 11/16/20 1702    Sherwood Gambler, MD 11/17/20 1504

## 2020-11-16 NOTE — ED Triage Notes (Signed)
Pain in right forearm/wrist states a piece of metal fell on arm today

## 2020-11-18 DIAGNOSIS — F1410 Cocaine abuse, uncomplicated: Secondary | ICD-10-CM | POA: Insufficient documentation

## 2020-11-20 ENCOUNTER — Encounter: Payer: Self-pay | Admitting: Orthopedic Surgery

## 2020-11-20 ENCOUNTER — Other Ambulatory Visit: Payer: Self-pay

## 2020-11-20 ENCOUNTER — Ambulatory Visit (INDEPENDENT_AMBULATORY_CARE_PROVIDER_SITE_OTHER): Payer: Medicaid Other | Admitting: Orthopedic Surgery

## 2020-11-20 ENCOUNTER — Telehealth: Payer: Self-pay | Admitting: Radiology

## 2020-11-20 VITALS — BP 143/79 | HR 76 | Ht 71.0 in | Wt 215.0 lb

## 2020-11-20 DIAGNOSIS — S52501A Unspecified fracture of the lower end of right radius, initial encounter for closed fracture: Secondary | ICD-10-CM

## 2020-11-20 DIAGNOSIS — S52601A Unspecified fracture of lower end of right ulna, initial encounter for closed fracture: Secondary | ICD-10-CM | POA: Diagnosis not present

## 2020-11-20 NOTE — Patient Instructions (Addendum)
Surgery is going to be Friday Feb 4th   1 plate will be placed on each bone in your forarm  I will call Dr Merlene Laughter to see what medication he recommends for pain control

## 2020-11-20 NOTE — Progress Notes (Signed)
Chief Complaint  Patient presents with  . Arm Injury      Right forearm fracture was at home working and piece of metal fell on it 11/16/20      Patient referred to us from the ER date of injury November 16, 2020.  Patient was working around his house piece of metal fell on his arm.  He fractured both radius and ulna.   Complains of severe pain.   He is on Suboxone 5/0.5   He also has a history of a blood clot for which she is on Xarelto which he stopped Friday   He complains of global numbness in his right hand since the injury   Review of Systems  Constitutional: Negative for chills and fever.  Musculoskeletal:       Chronic pain  All other systems reviewed and are negative.       Past Medical History:  Diagnosis Date  . Arthritis    . Asthma    . Bipolar 1 disorder (HCC)    . Chronic knee pain    . DVT (deep venous thrombosis) (HCC)    . Hypertension    . Insomnia    . Suicide attempt (HCC)      2010 Intentional overdose attempt with Depakote.         Past Surgical History:  Procedure Laterality Date  . COLONOSCOPY WITH PROPOFOL N/A 03/18/2019    Procedure: COLONOSCOPY WITH PROPOFOL;  Surgeon: Rourk, Robert M, MD;  [facility];  Service: Endoscopy;  Laterality: N/A;  2:30pm  . FOOT SURGERY        had peice of wire removed  . IVC FILTER REMOVAL N/A 02/24/2017    Procedure: IVC Filter Removal;  Surgeon: Dew, Jason S, MD;  Location: [facility];  Service: Cardiovascular;  Laterality: N/A;  . PERIPHERAL VASCULAR CATHETERIZATION Right 10/22/2016    Procedure: Thrombectomy, thrombolyisis;  Surgeon: Gregory G Schnier, MD;  Location: [facility];  Service: Cardiovascular;  Laterality: Right;  . PERIPHERAL VASCULAR CATHETERIZATION N/A 10/22/2016    Procedure: IVC Filter Insertion;  Surgeon: Gregory G Schnier, MD;  Location: [facility];  Service: Cardiovascular;  Laterality: N/A;  . POLYPECTOMY   03/18/2019    Procedure: POLYPECTOMY;  Surgeon:  Rourk, Robert M, MD;  [facility];  Service: Endoscopy;;         Family History  Problem Relation Age of Onset  . Cancer Mother    . Heart attack Father    . Colon cancer Neg Hx      Social History         Tobacco Use  . Smoking status: Current Every Day Smoker      Packs/day: 0.50      Types: Cigarettes  . Smokeless tobacco: Never Used  Vaping Use  . Vaping Use: Never used  Substance Use Topics  . Alcohol use: Yes      Comment: occasionally: on weekends (typically a pint on the weekend)  . Drug use: Yes      Types: Marijuana      Comment: smokes for pain    BP (!) 143/79   Pulse 76   Ht 5' 11" (1.803 m)   Wt 215 lb (97.5 kg)   BMI 29.99 kg/m    Physical Exam Vitals and nursing note reviewed.  Constitutional:      General: He is not in acute distress.    Appearance: Normal appearance. He is not   ill-appearing.  HENT:     Head: Normocephalic and atraumatic.  Eyes:     General: No scleral icterus.       Right eye: No discharge.        Left eye: No discharge.     Extraocular Movements: Extraocular movements intact.     Conjunctiva/sclera: Conjunctivae normal.     Pupils: Pupils are equal, round, and reactive to light.  Cardiovascular:     Rate and Rhythm: Normal rate and regular rhythm.     Pulses: Normal pulses.  Musculoskeletal:     Cervical back: Normal range of motion.     Comments: Right forearm tender mid forearm skin intact, alignment normal.  Patient having pain when he moves his fingers passive elbow range of motion is normal except for pain when moving the forearm  Skin:    General: Skin is warm and dry.     Capillary Refill: Capillary refill takes less than 2 seconds.  Neurological:     General: No focal deficit present.     Mental Status: He is alert and oriented to person, place, and time.     Comments: Radial, median, ulnar nerve tested sensation normal  Psychiatric:        Mood and Affect: Mood normal.        Behavior: Behavior  normal.        Thought Content: Thought content normal.        Judgment: Judgment normal.      Images that were read by me: Images at the ER showed both bones forearm fracture in the midshaft       Encounter Diagnosis  Name Primary?  . Closed fracture of distal ends of right radius and ulna, initial encounter Yes      Plan open reduction internal fixation right forearm with double plating   The procedure has been fully reviewed with the patient; The risks and benefits of surgery have been discussed and explained and understood. Alternative treatment has also been reviewed, questions were encouraged and answered. The postoperative plan is also been reviewed.    

## 2020-11-20 NOTE — Telephone Encounter (Signed)
Cancelled surgery for Friday, they could not accommodate [place] in the am due to staffing, had to RS to Feb 8th I called patient to advise he has voiced understanding

## 2020-11-21 ENCOUNTER — Telehealth: Payer: Self-pay | Admitting: Radiology

## 2020-11-21 NOTE — Telephone Encounter (Signed)
Called to check on the authorization for [employer] [phone] and authorization not required for his upcoming surgery for CPT 25574 ORIF right forearm

## 2020-11-22 ENCOUNTER — Other Ambulatory Visit: Payer: Self-pay

## 2020-11-22 ENCOUNTER — Ambulatory Visit: Payer: Medicaid Other | Admitting: Family Medicine

## 2020-11-22 VITALS — BP 100/64 | Ht 71.0 in | Wt 220.0 lb

## 2020-11-22 DIAGNOSIS — M7918 Myalgia, other site: Secondary | ICD-10-CM

## 2020-11-22 NOTE — Progress Notes (Signed)
Lance Cohen - 50 y.o. male MRN [id number]  Date of birth: 03-22-1971  SUBJECTIVE:  Including CC & ROS.  No chief complaint on file.   Lance Cohen is a 50 y.o. male that is presenting with back pain all throughout the back.  He recently fell and broke his wrist.  He is scheduled for surgery for his wrist.  His back pain occurs intermittently.   Review of Systems See HPI   HISTORY: Past Medical, Surgical, Social, and Family History Reviewed & Updated per EMR.   Pertinent Historical Findings include:  Past Medical History:  Diagnosis Date  . Arthritis   . Asthma   . Bipolar 1 disorder ([place])   . Chronic knee pain   . DVT (deep venous thrombosis) ([place])   . Hypertension   . Insomnia   . Suicide attempt [facility])    2010 Intentional overdose attempt with Depakote.    Past Surgical History:  Procedure Laterality Date  . COLONOSCOPY WITH PROPOFOL N/A 03/18/2019   Procedure: COLONOSCOPY WITH PROPOFOL;  Surgeon: [name], MD;  [facility];  Service: Endoscopy;  Laterality: N/A;  2:30pm  . FOOT SURGERY     had peice of wire removed  . IVC FILTER REMOVAL N/A 02/24/2017   Procedure: IVC Filter Removal;  Surgeon: [name], MD;  Location: [place] [facility];  Service: Cardiovascular;  Laterality: N/A;  . PERIPHERAL VASCULAR CATHETERIZATION Right 10/22/2016   Procedure: Thrombectomy, thrombolyisis;  Surgeon: [name], MD;  Location: [place] [facility];  Service: Cardiovascular;  Laterality: Right;  . PERIPHERAL VASCULAR CATHETERIZATION N/A 10/22/2016   Procedure: IVC Filter Insertion;  Surgeon: [name], MD;  Location: [place] [facility];  Service: Cardiovascular;  Laterality: N/A;  . POLYPECTOMY  03/18/2019   Procedure: POLYPECTOMY;  Surgeon: [name], MD;  [facility];  Service: Endoscopy;;    Family History  Problem Relation Age of Onset  . Cancer Mother   . Heart attack Father   . Colon cancer Neg Hx     Social  History   Socioeconomic History  . Marital status: Married    Spouse name: Not on file  . Number of children: Not on file  . Years of education: Not on file  . Highest education level: Not on file  Occupational History  . Not on file  Tobacco Use  . Smoking status: Current Every Day Smoker    Packs/day: 0.50    Types: Cigarettes  . Smokeless tobacco: Never Used  Vaping Use  . Vaping Use: Never used  Substance and Sexual Activity  . Alcohol use: Yes    Comment: occasionally: on weekends (typically a pint on the weekend)  . Drug use: Yes    Types: Marijuana    Comment: smokes for pain  . Sexual activity: Yes  Other Topics Concern  . Not on file  Social History Narrative  . Not on file   Social Determinants of Health   Financial Resource Strain: Not on file  Food Insecurity: Not on file  Transportation Needs: Not on file  Physical Activity: Not on file  Stress: Not on file  Social Connections: Not on file  Intimate Partner Violence: Not on file     PHYSICAL EXAM:  VS: BP 100/64 (BP Location: Left Arm, Patient Position: Sitting, Cuff Size: Large)   Ht 5\' 11"  (1.803 m)   Wt 220 lb (99.8 kg)   BMI 30.68 kg/m  Physical  Exam Gen: NAD, alert, cooperative with exam, well-appearing    ASSESSMENT & PLAN:   Myofascial pain dysfunction syndrome Has pain throughout his upper back and midline lower back.  Likely exacerbated with his recent fracture and exacerbated by having to wear a shoulder immobilizer.  May have a component of fibromyalgia.  He is currently in the pain clinic. -Counseled on home exercise therapy and supportive care. -Could consider integrative therapy and medicine. -Could consider trigger point injections.

## 2020-11-22 NOTE — Patient Instructions (Addendum)
Lance Cohen  11/22/2020     @PREFPERIOPPHARMACY @   Your procedure is scheduled on  12/05/2020   Report to [facility] at  [phone]  A.M.   Call this number if you have problems the morning of surgery:  [phone]   Remember:  Do not eat or drink after midnight.                         Take these medicines the morning of surgery with A SIP OF WATER     allopurinol, suboxone, lyrica, tramadol(if needed).    Follow any instructions given to you by Dr Aline Brochure concerning your Xarelto.      Do not wear jewelry, make-up or nail polish.  Do not wear lotions, powders, or perfumes, or deodorant.  Do not shave 48 hours prior to surgery.  Men may shave face and neck.  Do not bring valuables to the hospital.  [facility] is not responsible for any belongings or valuables.  Contacts, dentures or bridgework may not be worn into surgery.  Leave your suitcase in the car.  After surgery it may be brought to your room.  For patients admitted to the hospital, discharge time will be determined by your treatment team.  Patients discharged the day of surgery will not be allowed to drive home and must have someone with then for 24 hours.   Special instructions:  DO NOT smoke tobacco or vape the morning of your procedure.   Shower the night before and the morning of your procedure using CHG. DO NOT put CHG on your face, hair or gentials.  After your night time shower, dry off with a clean towel, put on clean clothes to sleep in and place clean sheets on your bed before you sleep.  After your morning shower,dry off with a clean towel, put on clean, comfortable clothes and brush your teeth before your come to the hospital.  Please read over the following fact sheets that you were given. Coughing and Deep Breathing, Surgical Site Infection Prevention, Anesthesia Post-op Instructions and Care and Recovery After Surgery       Wrist Fracture Treated With ORIF, Care After This sheet  gives you information about how to care for yourself after your procedure. Your health care provider may also give you more specific instructions. If you have problems or questions, contact your health care provider. What can I expect after the procedure? After the procedure, it is common to have:  Pain.  Swelling.  Stiffness.  A small amount of drainage from the incision. Follow these instructions at home: If you have a cast:  Do not stick anything inside the cast to scratch your skin. Doing that increases your risk of infection.  Check the skin around the cast every day. Tell your health care provider about any concerns.  You may put lotion on dry skin around the edges of the cast. Do not put lotion on the skin underneath the cast.  Keep the cast clean and dry. If you have a splint or sling:  Wear the splint or sling as told by your health care provider. Remove it only as told by your health care provider.  Loosen the splint or sling if your fingers tingle, become numb, or turn cold and blue.  Keep the splint or sling clean and dry. Bathing  Do not take baths, swim, or use a hot tub until your health care  provider approves. Ask your health care provider if you may take showers. You may only be allowed to take sponge baths.  If your cast, splint, or sling is not waterproof: ? Do not let it get wet. ? Cover it with a watertight covering when you take a bath or shower.  If you have a sling, remove it for bathing only if your health care provider tells you it is safe to do that.  Keep the bandage (dressing) dry until your health care provider says it can be removed. Incision care  Follow instructions from your health care provider about how to take care of your incision. Make sure you: ? Wash your hands with soap and water for at least 20 seconds before and after you change your dressing. If soap and water are not available, use hand sanitizer. ? Change your dressing as told  by your health care provider. ? Leave stitches (sutures), skin glue, or adhesive strips in place. These skin closures may need to stay in place for 2 weeks or longer. If adhesive strip edges start to loosen and curl up, you may trim the loose edges. Do not remove adhesive strips completely unless your health care provider tells you to do that.  Check your incision area every day for signs of infection. Check for: ? Redness. ? More swelling or pain. ? Blood or more fluid. ? Warmth. ? Pus or a bad smell.   Managing pain, stiffness, and swelling  If directed, put ice on the injured area. To do this: ? If you have a removable splint or sling, remove it as told by your health care provider. ? Put ice in a plastic bag. ? Place a towel between your skin and the bag or between your cast and the bag. ? Leave the ice on for 20 minutes, 2-3 times a day. ? Remove the ice if your skin turns bright red. This is very important. If you cannot feel pain, heat, or cold, you have a greater risk of damage to the area.  Move your fingers often to reduce stiffness and swelling.  Raise (elevate) the injured area above the level of your heart while you are sitting or lying down.   Driving  If you were given a sedative during the procedure, it can affect you for several hours. Do not drive or operate machinery until your health care provider says that it is safe.  Ask your health care provider when it is safe to drive if you have a cast, splint, or sling on your wrist. Activity  Return to your normal activities as told by your health care provider. Ask your health care provider what activities are safe for you.  Do exercises as told by your health care provider.  Do not lift with or put weight on your injured wrist until your health care provider approves.  Avoid pulling and pushing. Medicines  Take over-the-counter and prescription medicines only as told by your health care provider.  Ask your health  care provider if the medicine prescribed to you: ? Requires you to avoid driving or using machinery. ? Can cause constipation. You may need to take these actions to prevent or treat constipation:  Drink enough fluid to keep your urine pale yellow.  Take over-the-counter or prescription medicines.  Eat foods that are high in fiber, such as beans, whole grains, and fresh fruits and vegetables.  Limit foods that are high in fat and processed sugars, such as fried or sweet foods.  General instructions  Do not put pressure on any part of the cast or splint until it is fully hardened. This may take several hours.  Do not use any products that contain nicotine or tobacco, such as cigarettes, e-cigarettes, and chewing tobacco. These can delay bone healing after surgery. If you need help quitting, ask your health care provider.  Keep all follow-up visits. This is important. Contact a health care provider if:  Your cast, splint, or sling is damaged or loose.  Your pain is not controlled with medicine.  You have any of these signs of infection: ? A fever. ? Redness around your incision. ? More swelling or pain around your incision. ? Blood or more fluid coming from your incision. ? Warmth coming from your incision. ? Pus or a bad smell coming from your incision or your dressing.  You develop a rash. Get help right away if:  Your skin or fingers on your injured arm turn blue or gray.  Your arm feels cold or numb.  You have severe pain in your injured wrist.  You have trouble breathing.  You feel faint or light-headed. Summary  After the procedure, it is common to have pain, swelling, stiffness, and a small amount of drainage from the incision.  You may use ice, elevation, and pain medicine as told by your health care provider to reduce pain and swelling.  Wear your splint or sling as told by your health care provider.  Do not lift with or put weight on your injured wrist until  your health care provider approves. This information is not intended to replace advice given to you by your health care provider. Make sure you discuss any questions you have with your health care provider. Document Revised: 01/18/2020 Document Reviewed: 01/18/2020 Elsevier Patient Education  [place] Anesthesia, Adult, Care After This sheet gives you information about how to care for yourself after your procedure. Your health care provider may also give you more specific instructions. If you have problems or questions, contact your health care provider. What can I expect after the procedure? After the procedure, the following side effects are common:  Pain or discomfort at the IV site.  Nausea.  Vomiting.  Sore throat.  Trouble concentrating.  Feeling cold or chills.  Feeling weak or tired.  Sleepiness and fatigue.  Soreness and body aches. These side effects can affect parts of the body that were not involved in surgery. Follow these instructions at home: For the time period you were told by your health care provider:  Rest.  Do not participate in activities where you could fall or become injured.  Do not drive or use machinery.  Do not drink alcohol.  Do not take sleeping pills or medicines that cause drowsiness.  Do not make important decisions or sign legal documents.  Do not take care of children on your own.   Eating and drinking  Follow any instructions from your health care provider about eating or drinking restrictions.  When you feel hungry, start by eating small amounts of foods that are soft and easy to digest (bland), such as toast. Gradually return to your regular diet.  Drink enough fluid to keep your urine pale yellow.  If you vomit, rehydrate by drinking water, juice, or clear broth. General instructions  If you have sleep apnea, surgery and certain medicines can increase your risk for breathing problems. Follow instructions  from your health care provider about wearing your sleep device: ? Anytime  you are sleeping, including during daytime naps. ? While taking prescription pain medicines, sleeping medicines, or medicines that make you drowsy.  Have a responsible adult stay with you for the time you are told. It is important to have someone help care for you until you are awake and alert.  Return to your normal activities as told by your health care provider. Ask your health care provider what activities are safe for you.  Take over-the-counter and prescription medicines only as told by your health care provider.  If you smoke, do not smoke without supervision.  Keep all follow-up visits as told by your health care provider. This is important. Contact a health care provider if:  You have nausea or vomiting that does not get better with medicine.  You cannot eat or drink without vomiting.  You have pain that does not get better with medicine.  You are unable to pass urine.  You develop a skin rash.  You have a fever.  You have redness around your IV site that gets worse. Get help right away if:  You have difficulty breathing.  You have chest pain.  You have blood in your urine or stool, or you vomit blood. Summary  After the procedure, it is common to have a sore throat or nausea. It is also common to feel tired.  Have a responsible adult stay with you for the time you are told. It is important to have someone help care for you until you are awake and alert.  When you feel hungry, start by eating small amounts of foods that are soft and easy to digest (bland), such as toast. Gradually return to your regular diet.  Drink enough fluid to keep your urine pale yellow.  Return to your normal activities as told by your health care provider. Ask your health care provider what activities are safe for you. This information is not intended to replace advice given to you by your health care provider.  Make sure you discuss any questions you have with your health care provider. Document Revised: 06/22/2020 Document Reviewed: 01/20/2020 Elsevier Patient Education  2021 [employer].

## 2020-11-22 NOTE — Patient Instructions (Signed)
Good to see you Good luck with your surgery  We will be in touch if we can try Integrative medicine   Please send me a message in MyChart with any questions or updates.  Please see me back in 4-6 weeks.   --Dr. Raeford Razor

## 2020-11-22 NOTE — Assessment & Plan Note (Signed)
Has pain throughout his upper back and midline lower back.  Likely exacerbated with his recent fracture and exacerbated by having to wear a shoulder immobilizer.  May have a component of fibromyalgia.  He is currently in the pain clinic. -Counseled on home exercise therapy and supportive care. -Could consider integrative therapy and medicine. -Could consider trigger point injections.

## 2020-11-23 ENCOUNTER — Telehealth: Payer: Self-pay | Admitting: Radiology

## 2020-11-23 ENCOUNTER — Other Ambulatory Visit: Payer: Self-pay

## 2020-11-23 ENCOUNTER — Encounter (HOSPITAL_COMMUNITY): Payer: Self-pay

## 2020-11-23 ENCOUNTER — Encounter (HOSPITAL_COMMUNITY)
Admission: RE | Admit: 2020-11-23 | Discharge: 2020-11-23 | Disposition: A | Discharge status: Home / Self Care | Payer: Medicaid Other | Source: Ambulatory Visit | Attending: Orthopedic Surgery | Admitting: Orthopedic Surgery

## 2020-11-23 ENCOUNTER — Other Ambulatory Visit (HOSPITAL_COMMUNITY): Admission: RE | Admit: 2020-11-23 | Payer: Medicaid Other | Source: Ambulatory Visit

## 2020-11-23 DIAGNOSIS — Z01812 Encounter for preprocedural laboratory examination: Secondary | ICD-10-CM | POA: Diagnosis not present

## 2020-11-23 HISTORY — DX: Depression, unspecified: F32.A

## 2020-11-23 HISTORY — DX: Anxiety disorder, unspecified: F41.9

## 2020-11-23 HISTORY — DX: Sleep apnea, unspecified: G47.30

## 2020-11-23 HISTORY — DX: Spinal stenosis, site unspecified: M48.00

## 2020-11-23 LAB — CBC WITH DIFFERENTIAL/PLATELET
Abs Immature Granulocytes: 0.01 10*3/uL (ref 0.00–0.07)
Basophils Absolute: 0.1 10*3/uL (ref 0.0–0.1)
Basophils Relative: 1 %
Eosinophils Absolute: 0.4 10*3/uL (ref 0.0–0.5)
Eosinophils Relative: 5 %
HCT: 40.9 % (ref 39.0–52.0)
Hemoglobin: 14 g/dL (ref 13.0–17.0)
Immature Granulocytes: 0 %
Lymphocytes Relative: 28 %
Lymphs Abs: 2.1 10*3/uL (ref 0.7–4.0)
MCH: 33.8 pg (ref 26.0–34.0)
MCHC: 34.2 g/dL (ref 30.0–36.0)
MCV: 98.8 fL (ref 80.0–100.0)
Monocytes Absolute: 0.8 10*3/uL (ref 0.1–1.0)
Monocytes Relative: 11 %
Neutro Abs: 4 10*3/uL (ref 1.7–7.7)
Neutrophils Relative %: 55 %
Platelets: 273 10*3/uL (ref 150–400)
RBC: 4.14 MIL/uL — ABNORMAL LOW (ref 4.22–5.81)
RDW: 12.5 % (ref 11.5–15.5)
WBC: 7.3 10*3/uL (ref 4.0–10.5)
nRBC: 0 % (ref 0.0–0.2)

## 2020-11-23 LAB — BASIC METABOLIC PANEL
Anion gap: 6 (ref 5–15)
BUN: 18 mg/dL (ref 6–20)
CO2: 29 mmol/L (ref 22–32)
Calcium: 9.1 mg/dL (ref 8.9–10.3)
Chloride: 101 mmol/L (ref 98–111)
Creatinine, Ser: 1.14 mg/dL (ref 0.61–1.24)
GFR, Estimated: >60 mL/min (ref >60)
Glucose, Bld: 93 mg/dL (ref 70–99)
Potassium: 3.6 mmol/L (ref 3.5–5.1)
Sodium: 136 mmol/L (ref 135–145)

## 2020-11-23 NOTE — Telephone Encounter (Signed)
Dr Aline Brochure has asked me to call patient advise him to d/c Xarelto on 10th for surgery which has been RS again to Feb 15th.   I will call him

## 2020-11-23 NOTE — Telephone Encounter (Signed)
He called back cant come in today he will come in tomorrow. About 10 am.

## 2020-11-23 NOTE — Telephone Encounter (Signed)
I explained the covid situation to him, and he understood  States he went to get the Xarelto and it was not at pharmacy, he will try to contact the [facility] clinic and see what is going on with Xarelto  He is having a lot of pain and no better with the splint, said splint is coming off, I told him to come by for me to look at it today I am here until 5. He will get a ride and come in.

## 2020-11-23 NOTE — Telephone Encounter (Signed)
Due to covid patients, no nurses to take care of surgery patients they are all caring for covid patients at this time, and they have been holding patients in the OR/ Endo area.   Left message for him to call me back

## 2020-11-23 NOTE — Telephone Encounter (Signed)
Patient called asked why surgery was rescheduled. Please contact patient [phone].

## 2020-11-24 NOTE — Telephone Encounter (Signed)
He came in was uncomfortable in splint I unwrapped ace bandages and re wrapped them leaving sugar tong in place. I have told him to move his fingers to help with swelling. He voiced understanding and stated he felt better after ace wraps were replaced.  To you FYI

## 2020-11-27 ENCOUNTER — Encounter: Payer: Self-pay | Admitting: Orthopedic Surgery

## 2020-11-27 ENCOUNTER — Telehealth: Payer: Self-pay | Admitting: Radiology

## 2020-11-27 ENCOUNTER — Ambulatory Visit (INDEPENDENT_AMBULATORY_CARE_PROVIDER_SITE_OTHER): Payer: Medicaid Other | Admitting: Orthopedic Surgery

## 2020-11-27 ENCOUNTER — Other Ambulatory Visit: Payer: Self-pay

## 2020-11-27 DIAGNOSIS — S52601D Unspecified fracture of lower end of right ulna, subsequent encounter for closed fracture with routine healing: Secondary | ICD-10-CM

## 2020-11-27 DIAGNOSIS — R569 Unspecified convulsions: Secondary | ICD-10-CM | POA: Insufficient documentation

## 2020-11-27 DIAGNOSIS — R42 Dizziness and giddiness: Secondary | ICD-10-CM | POA: Insufficient documentation

## 2020-11-27 DIAGNOSIS — R519 Headache, unspecified: Secondary | ICD-10-CM | POA: Insufficient documentation

## 2020-11-27 DIAGNOSIS — S52501D Unspecified fracture of the lower end of right radius, subsequent encounter for closed fracture with routine healing: Secondary | ICD-10-CM

## 2020-11-27 NOTE — Progress Notes (Signed)
Chief Complaint  Patient presents with  . Arm Pain    Has fracture, scheduled for surgery on 12/05/20 states pain swelling better since rewrapped on Friday but wants to talk to Dr Craig Staggers to be okay with the way things are.  Does not want anything else unwrapped  Wants of the be taken out of his leg we will do that at a later date

## 2020-11-30 ENCOUNTER — Ambulatory Visit: Payer: Medicaid Other | Admitting: Orthopedic Surgery

## 2020-12-01 ENCOUNTER — Encounter (HOSPITAL_COMMUNITY): Admission: RE | Admit: 2020-12-01 | Payer: Medicaid Other | Source: Ambulatory Visit

## 2020-12-04 ENCOUNTER — Other Ambulatory Visit: Payer: Self-pay

## 2020-12-04 ENCOUNTER — Other Ambulatory Visit (HOSPITAL_COMMUNITY)
Admission: RE | Admit: 2020-12-04 | Discharge: 2020-12-04 | Disposition: A | Discharge status: Home / Self Care | Payer: Medicaid Other | Source: Ambulatory Visit | Attending: Orthopedic Surgery | Admitting: Orthopedic Surgery

## 2020-12-04 DIAGNOSIS — Z20822 Contact with and (suspected) exposure to covid-19: Secondary | ICD-10-CM | POA: Insufficient documentation

## 2020-12-04 DIAGNOSIS — Z01812 Encounter for preprocedural laboratory examination: Secondary | ICD-10-CM | POA: Insufficient documentation

## 2020-12-04 LAB — SARS CORONAVIRUS 2 (TAT 6-24 HRS): SARS Coronavirus 2: NEGATIVE

## 2020-12-05 ENCOUNTER — Encounter (HOSPITAL_COMMUNITY)
Admission: RE | Disposition: A | Discharge status: Home / Self Care | Payer: Self-pay | Source: Home / Self Care | Attending: Orthopedic Surgery

## 2020-12-05 ENCOUNTER — Ambulatory Visit (HOSPITAL_COMMUNITY): Payer: Medicaid Other | Admitting: Anesthesiology

## 2020-12-05 ENCOUNTER — Ambulatory Visit (HOSPITAL_COMMUNITY): Payer: Medicaid Other

## 2020-12-05 ENCOUNTER — Ambulatory Visit (HOSPITAL_COMMUNITY)
Admission: RE | Admit: 2020-12-05 | Discharge: 2020-12-05 | Disposition: A | Discharge status: Home / Self Care | Payer: Medicaid Other | Attending: Orthopedic Surgery | Admitting: Orthopedic Surgery

## 2020-12-05 ENCOUNTER — Encounter (HOSPITAL_COMMUNITY): Payer: Self-pay | Admitting: Orthopedic Surgery

## 2020-12-05 DIAGNOSIS — S5291XD Unspecified fracture of right forearm, subsequent encounter for closed fracture with routine healing: Secondary | ICD-10-CM | POA: Diagnosis not present

## 2020-12-05 DIAGNOSIS — F17210 Nicotine dependence, cigarettes, uncomplicated: Secondary | ICD-10-CM | POA: Diagnosis not present

## 2020-12-05 DIAGNOSIS — Z79899 Other long term (current) drug therapy: Secondary | ICD-10-CM | POA: Diagnosis not present

## 2020-12-05 DIAGNOSIS — Z86718 Personal history of other venous thrombosis and embolism: Secondary | ICD-10-CM | POA: Insufficient documentation

## 2020-12-05 DIAGNOSIS — Z7901 Long term (current) use of anticoagulants: Secondary | ICD-10-CM | POA: Diagnosis not present

## 2020-12-05 DIAGNOSIS — S52201A Unspecified fracture of shaft of right ulna, initial encounter for closed fracture: Secondary | ICD-10-CM | POA: Insufficient documentation

## 2020-12-05 DIAGNOSIS — W208XXA Other cause of strike by thrown, projected or falling object, initial encounter: Secondary | ICD-10-CM | POA: Insufficient documentation

## 2020-12-05 DIAGNOSIS — Y92009 Unspecified place in unspecified non-institutional (private) residence as the place of occurrence of the external cause: Secondary | ICD-10-CM | POA: Diagnosis not present

## 2020-12-05 DIAGNOSIS — S5290XA Unspecified fracture of unspecified forearm, initial encounter for closed fracture: Secondary | ICD-10-CM

## 2020-12-05 DIAGNOSIS — S5291XA Unspecified fracture of right forearm, initial encounter for closed fracture: Secondary | ICD-10-CM

## 2020-12-05 HISTORY — PX: ORIF RADIAL FRACTURE: SHX5113

## 2020-12-05 SURGERY — OPEN REDUCTION INTERNAL FIXATION (ORIF) RADIAL FRACTURE
Anesthesia: Regional | Site: Arm Lower | Laterality: Right

## 2020-12-05 MED ORDER — GLYCOPYRROLATE PF 0.2 MG/ML IJ SOSY
PREFILLED_SYRINGE | INTRAMUSCULAR | Status: AC
  Filled 2020-12-05: qty 1

## 2020-12-05 MED ORDER — ONDANSETRON HCL 4 MG/2ML IJ SOLN
INTRAMUSCULAR | Status: DC | PRN
  Administered 2020-12-05: 4 mg via INTRAVENOUS

## 2020-12-05 MED ORDER — DEXAMETHASONE SODIUM PHOSPHATE 4 MG/ML IJ SOLN
INTRAMUSCULAR | Status: DC | PRN
  Administered 2020-12-05: 8 mg via PERINEURAL

## 2020-12-05 MED ORDER — DEXAMETHASONE SODIUM PHOSPHATE 4 MG/ML IJ SOLN
INTRAMUSCULAR | Status: DC | PRN
  Administered 2020-12-05: 8 mg via INTRAVENOUS

## 2020-12-05 MED ORDER — MIDAZOLAM HCL 2 MG/2ML IJ SOLN
2.0000 mg | Freq: Once | INTRAMUSCULAR | Status: DC
  Filled 2020-12-05: qty 2

## 2020-12-05 MED ORDER — ORAL CARE MOUTH RINSE: 15.0000 mL | Freq: Once | OROMUCOSAL | Status: AC

## 2020-12-05 MED ORDER — KETAMINE HCL 10 MG/ML IJ SOLN
INTRAMUSCULAR | Status: DC | PRN
Start: 2020-12-05 — End: 2020-12-05
  Administered 2020-12-05: 20 mg via INTRAVENOUS

## 2020-12-05 MED ORDER — PROMETHAZINE HCL 25 MG/ML IJ SOLN: 6.2500 mg | INTRAMUSCULAR | Status: DC | PRN

## 2020-12-05 MED ORDER — MIDAZOLAM HCL 5 MG/5ML IJ SOLN
INTRAMUSCULAR | Status: DC | PRN
  Administered 2020-12-05: 2 mg via INTRAVENOUS

## 2020-12-05 MED ORDER — OXYCODONE-ACETAMINOPHEN 10-325 MG PO TABS: 1.0000 | ORAL_TABLET | ORAL | 0 refills | Status: AC | PRN

## 2020-12-05 MED ORDER — VANCOMYCIN HCL IN DEXTROSE 1-5 GM/200ML-% IV SOLN
INTRAVENOUS | Status: AC
  Filled 2020-12-05: qty 200

## 2020-12-05 MED ORDER — DEXMEDETOMIDINE (PRECEDEX) IN NS 20 MCG/5ML (4 MCG/ML) IV SYRINGE
PREFILLED_SYRINGE | INTRAVENOUS | Status: AC
  Filled 2020-12-05: qty 10

## 2020-12-05 MED ORDER — PROPOFOL 10 MG/ML IV BOLUS
INTRAVENOUS | Status: DC | PRN
  Administered 2020-12-05: 200 mg via INTRAVENOUS

## 2020-12-05 MED ORDER — FENTANYL CITRATE (PF) 100 MCG/2ML IJ SOLN
100.0000 ug | Freq: Once | INTRAMUSCULAR | Status: DC
  Filled 2020-12-05: qty 2

## 2020-12-05 MED ORDER — ONDANSETRON HCL 4 MG/2ML IJ SOLN
INTRAMUSCULAR | Status: AC
  Filled 2020-12-05: qty 2

## 2020-12-05 MED ORDER — LIDOCAINE HCL (PF) 1 % IJ SOLN
INTRAMUSCULAR | Status: AC
  Filled 2020-12-05: qty 30

## 2020-12-05 MED ORDER — CHLORHEXIDINE GLUCONATE 0.12 % MT SOLN
15.0000 mL | Freq: Once | OROMUCOSAL | Status: AC
  Administered 2020-12-05: 15 mL via OROMUCOSAL

## 2020-12-05 MED ORDER — DEXAMETHASONE SODIUM PHOSPHATE 4 MG/ML IJ SOLN
INTRAMUSCULAR | Status: AC
  Filled 2020-12-05: qty 2

## 2020-12-05 MED ORDER — FENTANYL CITRATE (PF) 100 MCG/2ML IJ SOLN
INTRAMUSCULAR | Status: AC
  Filled 2020-12-05: qty 2

## 2020-12-05 MED ORDER — ROPIVACAINE HCL 5 MG/ML IJ SOLN
INTRAMUSCULAR | Status: AC
  Filled 2020-12-05: qty 30

## 2020-12-05 MED ORDER — DEXAMETHASONE SODIUM PHOSPHATE 10 MG/ML IJ SOLN
INTRAMUSCULAR | Status: AC
  Filled 2020-12-05: qty 1

## 2020-12-05 MED ORDER — VANCOMYCIN HCL IN DEXTROSE 1-5 GM/200ML-% IV SOLN
1000.0000 mg | INTRAVENOUS | Status: AC
  Administered 2020-12-05: 1000 mg via INTRAVENOUS

## 2020-12-05 MED ORDER — ROPIVACAINE HCL 5 MG/ML IJ SOLN
INTRAMUSCULAR | Status: DC | PRN
  Administered 2020-12-05: 28 mL via PERINEURAL

## 2020-12-05 MED ORDER — HYDROMORPHONE HCL 1 MG/ML IJ SOLN: 0.2500 mg | INTRAMUSCULAR | Status: DC | PRN

## 2020-12-05 MED ORDER — FENTANYL CITRATE (PF) 100 MCG/2ML IJ SOLN
INTRAMUSCULAR | Status: DC | PRN
  Administered 2020-12-05 (×2): 50 ug via INTRAVENOUS

## 2020-12-05 MED ORDER — LIDOCAINE HCL (PF) 2 % IJ SOLN
INTRAMUSCULAR | Status: AC
  Filled 2020-12-05: qty 5

## 2020-12-05 MED ORDER — LACTATED RINGERS IV SOLN: INTRAVENOUS | Status: DC

## 2020-12-05 MED ORDER — MIDAZOLAM HCL 2 MG/2ML IJ SOLN
INTRAMUSCULAR | Status: AC
  Filled 2020-12-05: qty 2

## 2020-12-05 MED ORDER — DEXMEDETOMIDINE (PRECEDEX) IN NS 20 MCG/5ML (4 MCG/ML) IV SYRINGE
PREFILLED_SYRINGE | INTRAVENOUS | Status: DC | PRN
  Administered 2020-12-05 (×2): 20 ug via INTRAVENOUS

## 2020-12-05 MED ORDER — SODIUM CHLORIDE 0.9 % IR SOLN
Status: DC | PRN
  Administered 2020-12-05: 1000 mL

## 2020-12-05 MED ORDER — PROPOFOL 10 MG/ML IV BOLUS
INTRAVENOUS | Status: AC
  Filled 2020-12-05: qty 40

## 2020-12-05 MED ORDER — LIDOCAINE 2% (20 MG/ML) 5 ML SYRINGE
INTRAMUSCULAR | Status: DC | PRN
  Administered 2020-12-05: 100 mg via INTRAVENOUS

## 2020-12-05 MED ORDER — LIDOCAINE HCL (PF) 1 % IJ SOLN
INTRAMUSCULAR | Status: DC | PRN
  Administered 2020-12-05: 3 mL

## 2020-12-05 MED ORDER — MEPERIDINE HCL 50 MG/ML IJ SOLN: 6.2500 mg | INTRAMUSCULAR | Status: DC | PRN

## 2020-12-05 MED ORDER — KETAMINE HCL 50 MG/5ML IJ SOSY
PREFILLED_SYRINGE | INTRAMUSCULAR | Status: AC
  Filled 2020-12-05: qty 5

## 2020-12-05 MED ORDER — OXYCODONE HCL 5 MG PO TABS: 10.0000 mg | ORAL_TABLET | Freq: Once | ORAL | Status: DC

## 2020-12-05 MED ORDER — BUPIVACAINE-EPINEPHRINE (PF) 0.5% -1:200000 IJ SOLN
INTRAMUSCULAR | Status: AC
  Filled 2020-12-05: qty 30

## 2020-12-05 MED ORDER — GLYCOPYRROLATE PF 0.2 MG/ML IJ SOSY
PREFILLED_SYRINGE | INTRAMUSCULAR | Status: DC | PRN
  Administered 2020-12-05: .1 mg via INTRAVENOUS

## 2020-12-05 SURGICAL SUPPLY — 62 items
APL PRP STRL LF DISP 70% ISPRP (MISCELLANEOUS) ×1
BANDAGE ELASTIC 3 LF NS (GAUZE/BANDAGES/DRESSINGS) ×4 IMPLANT
BANDAGE ESMARK 4X12 BL STRL LF (DISPOSABLE) ×1 IMPLANT
BIT DRILL 2.5X110 QC LCP DISP (BIT) ×2 IMPLANT
BIT DRILL LONG 2.7 (BIT) ×1 IMPLANT
BLADE SURG SZ10 CARB STEEL (BLADE) ×2 IMPLANT
BNDG CMPR 12X4 ELC STRL LF (DISPOSABLE) ×1
BNDG CMPR MED 5X3 ELC HKLP NS (GAUZE/BANDAGES/DRESSINGS) ×2
BNDG CMPR STD VLCR NS LF 5.8X3 (GAUZE/BANDAGES/DRESSINGS) ×1
BNDG COHESIVE 4X5 TAN STRL (GAUZE/BANDAGES/DRESSINGS) ×2 IMPLANT
BNDG ELASTIC 3X5.8 VLCR NS LF (GAUZE/BANDAGES/DRESSINGS) ×2 IMPLANT
BNDG ESMARK 4X12 BLUE STRL LF (DISPOSABLE) ×2
CHLORAPREP W/TINT 26 (MISCELLANEOUS) ×2 IMPLANT
CLOTH BEACON ORANGE TIMEOUT ST (SAFETY) ×2 IMPLANT
COVER LIGHT HANDLE STERIS (MISCELLANEOUS) ×4 IMPLANT
COVER WAND RF STERILE (DRAPES) ×2 IMPLANT
CUFF TOURN SGL QUICK 18X4 (TOURNIQUET CUFF) ×2 IMPLANT
DECANTER SPIKE VIAL GLASS SM (MISCELLANEOUS) ×2 IMPLANT
DRAPE C-ARM FOLDED MOBILE STRL (DRAPES) ×2 IMPLANT
DRAPE HALF SHEET 40X57 (DRAPES) ×2 IMPLANT
DRILL BIT LONG 2.7 (BIT) ×2
DRSG XEROFORM 1X8 (GAUZE/BANDAGES/DRESSINGS) ×4 IMPLANT
ELECT REM PT RETURN 9FT ADLT (ELECTROSURGICAL) ×2
ELECTRODE REM PT RTRN 9FT ADLT (ELECTROSURGICAL) ×1 IMPLANT
GAUZE SPONGE 4X4 12PLY STRL (GAUZE/BANDAGES/DRESSINGS) ×2 IMPLANT
GAUZE XEROFORM 1X8 LF (GAUZE/BANDAGES/DRESSINGS) IMPLANT
GLOVE SKINSENSE NS SZ8.0 LF (GLOVE) ×1
GLOVE SKINSENSE STRL SZ8.0 LF (GLOVE) ×1 IMPLANT
GLOVE SS N UNI LF 8.5 STRL (GLOVE) ×2 IMPLANT
GLOVE SURG UNDER POLY LF SZ7 (GLOVE) ×6 IMPLANT
GOWN STRL REUS W/TWL LRG LVL3 (GOWN DISPOSABLE) ×4 IMPLANT
GOWN STRL REUS W/TWL XL LVL3 (GOWN DISPOSABLE) ×2 IMPLANT
KIT TURNOVER KIT A (KITS) ×2 IMPLANT
MANIFOLD NEPTUNE II (INSTRUMENTS) ×2 IMPLANT
NEEDLE HYPO 21X1.5 SAFETY (NEEDLE) ×2 IMPLANT
NS IRRIG 1000ML POUR BTL (IV SOLUTION) ×2 IMPLANT
PACK BASIC LIMB (CUSTOM PROCEDURE TRAY) ×2 IMPLANT
PAD ABD 5X9 TENDERSORB (GAUZE/BANDAGES/DRESSINGS) ×4 IMPLANT
PAD ARMBOARD 7.5X6 YLW CONV (MISCELLANEOUS) ×2 IMPLANT
PAD CAST 4YDX4 CTTN HI CHSV (CAST SUPPLIES) ×1 IMPLANT
PADDING CAST COTTON 4X4 STRL (CAST SUPPLIES) ×2
PADDING WEBRIL 4 STERILE (GAUZE/BANDAGES/DRESSINGS) ×4 IMPLANT
PROS LCP PLATE 6H 85MM (Plate) ×4 IMPLANT
PROSTHESIS LCP PLATE 6H 85MM (Plate) ×2 IMPLANT
SCREW CORTEX 3.5 16MM (Screw) ×1 IMPLANT
SCREW CORTEX 3.5 18MM (Screw) ×6 IMPLANT
SCREW CORTEX 3.5 20MM (Screw) ×4 IMPLANT
SCREW LOCK CORT ST 3.5X16 (Screw) ×1 IMPLANT
SCREW LOCK CORT ST 3.5X18 (Screw) ×6 IMPLANT
SCREW LOCK CORT ST 3.5X20 (Screw) ×4 IMPLANT
SCREW LOCK T15 FT 18X3.5X2.9X (Screw) ×1 IMPLANT
SCREW LOCKING 3.5X18 (Screw) ×2 IMPLANT
SET BASIN LINEN APH (SET/KITS/TRAYS/PACK) ×2 IMPLANT
SPLINT IMMOBILIZER J 3INX20FT (CAST SUPPLIES) ×1
SPLINT J IMMOBILIZER 3X20FT (CAST SUPPLIES) ×1 IMPLANT
SPONGE LAP 18X18 X RAY DECT (DISPOSABLE) ×2 IMPLANT
STAPLER VISISTAT 35W (STAPLE) ×4 IMPLANT
SUT ETHILON 3 0 FSL (SUTURE) IMPLANT
SUT MON AB 2-0 SH 27 (SUTURE) ×4
SUT MON AB 2-0 SH27 (SUTURE) ×2 IMPLANT
SYR CONTROL 10ML LL (SYRINGE) ×2 IMPLANT
WATER STERILE IRR 1000ML POUR (IV SOLUTION) ×2 IMPLANT

## 2020-12-05 NOTE — Brief Op Note (Signed)
12/05/2020  9:28 AM  PATIENT:  Lance Cohen  50 y.o. male  PRE-OPERATIVE DIAGNOSIS:  Right both bones (radius/ulna) forearm fracture  POST-OPERATIVE DIAGNOSIS:  Right both bones (radius/ulna) forearm fracture  PROCEDURE:  Procedure(s): OPEN REDUCTION INTERNAL FIXATION (ORIF) RADIAL FRACTURE and ULNA FRACTURE (Right)  Implants Synthes small frag 2, 6-hole plates with nonlocking screws on the ulnar plate x6 and 5 nonlocking screws on the radial plate 1 proximal locking screw (placed due to using all the 18 mm screws for the other holes)  No complications  Assisted by [facility] anesthesia with a preop supraclavicular block  No local    SURGEON:  Surgeon(s) and Role:    * Carole Civil, MD - Primary   BLOOD ADMINISTERED:none  DRAINS: none   LOCAL MEDICATIONS USED:  NONE  SPECIMEN:  No Specimen  DISPOSITION OF SPECIMEN:  N/A  COUNTS:  YES  TOURNIQUET:   Total Tourniquet Time Documented: Upper Arm (Right) - 80 minutes Total: Upper Arm (Right) - 80 minutes   DICTATION: .Viviann Spare Dictation  PLAN OF CARE: Discharge to home after PACU  PATIENT DISPOSITION:  PACU - hemodynamically stable.   Delay start of Pharmacological VTE agent (>24hrs) due to surgical blood loss or risk of bleeding: not applicable

## 2020-12-05 NOTE — Anesthesia Procedure Notes (Signed)
Procedure Name: LMA Insertion Date/Time: 12/05/2020 7:44 AM Performed by: Orlie Dakin, CRNA Pre-anesthesia Checklist: Patient identified, Emergency Drugs available, Suction available and Patient being monitored Patient Re-evaluated:Patient Re-evaluated prior to induction Oxygen Delivery Method: Circle system utilized Preoxygenation: Pre-oxygenation with 100% oxygen Induction Type: IV induction LMA: LMA inserted LMA Size: 4.0 Tube type: Oral Number of attempts: 1 Placement Confirmation: positive ETCO2 Tube secured with: Tape Dental Injury: Teeth and Oropharynx as per pre-operative assessment

## 2020-12-05 NOTE — Transfer of Care (Signed)
Immediate Anesthesia Transfer of Care Note  Patient: Lance Cohen  Procedure(s) Performed: OPEN REDUCTION INTERNAL FIXATION (ORIF) RADIAL FRACTURE and ULNA FRACTURE (Right Arm Lower)  Patient Location: PACU  Anesthesia Type:General and Regional  Level of Consciousness: drowsy  Airway & Oxygen Therapy: Patient Spontanous Breathing and Patient connected to face mask oxygen  Post-op Assessment: Report given to RN and Post -op Vital signs reviewed and stable  Post vital signs: Reviewed and stable  Last Vitals:  Vitals Value Taken Time  BP 91/61 12/05/20 0931  Temp    Pulse 50 12/05/20 0933  Resp 8 12/05/20 0933  SpO2 98 % 12/05/20 0933  Vitals shown include unvalidated device data.  Last Pain:  Vitals:   12/05/20 0643  TempSrc: Oral  PainSc: 7       Patients Stated Pain Goal: 9 (78/47/84 1282)  Complications: No complications documented.

## 2020-12-05 NOTE — Anesthesia Procedure Notes (Signed)
Anesthesia Regional Block: Supraclavicular block   Pre-Anesthetic Checklist: ,, timeout performed, Correct Patient, Correct Site, Correct Laterality, Correct Procedure, Correct Position, site marked, Risks and benefits discussed,  Surgical consent,  Pre-op evaluation,  At surgeon's request and post-op pain management  Laterality: Upper and Right  Prep: chloraprep       Needles:  Injection technique: Single-shot  Needle Type: Echogenic Stimulator Needle     Needle Length: 8.3cm  Needle Gauge: 22   Needle insertion depth: 6 cm   Additional Needles:   Procedures:, nerve stimulator,,,,,,,  Narrative:  Start time: 12/05/2020 7:22 AM End time: 12/05/2020 7:30 AM Injection made incrementally with aspirations every 5 mL.  Performed by: Personally  Anesthesiologist: Denese Killings, MD  Additional Notes: Block assessed prior to start of surgery, ropivacaine 0.5% 28 ml plus dexamethasone 8 mg were injected.

## 2020-12-05 NOTE — Discharge Instructions (Signed)
General Anesthesia, Adult, Care After This sheet gives you information about how to care for yourself after your procedure. Your health care provider may also give you more specific instructions. If you have problems or questions, contact your health care provider. What can I expect after the procedure? After the procedure, the following side effects are common:  Pain or discomfort at the IV site.  Nausea.  Vomiting.  Sore throat.  Trouble concentrating.  Feeling cold or chills.  Feeling weak or tired.  Sleepiness and fatigue.  Soreness and body aches. These side effects can affect parts of the body that were not involved in surgery. Follow these instructions at home: For the time period you were told by your health care provider:  Rest.  Do not participate in activities where you could fall or become injured.  Do not drive or use machinery.  Do not drink alcohol.  Do not take sleeping pills or medicines that cause drowsiness.  Do not make important decisions or sign legal documents.  Do not take care of children on your own.   Eating and drinking  Follow any instructions from your health care provider about eating or drinking restrictions.  When you feel hungry, start by eating small amounts of foods that are soft and easy to digest (bland), such as toast. Gradually return to your regular diet.  Drink enough fluid to keep your urine pale yellow.  If you vomit, rehydrate by drinking water, juice, or clear broth. General instructions  If you have sleep apnea, surgery and certain medicines can increase your risk for breathing problems. Follow instructions from your health care provider about wearing your sleep device: ? Anytime you are sleeping, including during daytime naps. ? While taking prescription pain medicines, sleeping medicines, or medicines that make you drowsy.  Have a responsible adult stay with you for the time you are told. It is important to have  someone help care for you until you are awake and alert.  Return to your normal activities as told by your health care provider. Ask your health care provider what activities are safe for you.  Take over-the-counter and prescription medicines only as told by your health care provider.  If you smoke, do not smoke without supervision.  Keep all follow-up visits as told by your health care provider. This is important. Contact a health care provider if:  You have nausea or vomiting that does not get better with medicine.  You cannot eat or drink without vomiting.  You have pain that does not get better with medicine.  You are unable to pass urine.  You develop a skin rash.  You have a fever.  You have redness around your IV site that gets worse. Get help right away if:  You have difficulty breathing.  You have chest pain.  You have blood in your urine or stool, or you vomit blood. Summary  After the procedure, it is common to have a sore throat or nausea. It is also common to feel tired.  Have a responsible adult stay with you for the time you are told. It is important to have someone help care for you until you are awake and alert.  When you feel hungry, start by eating small amounts of foods that are soft and easy to digest (bland), such as toast. Gradually return to your regular diet.  Drink enough fluid to keep your urine pale yellow.  Return to your normal activities as told by your health care provider.   Ask your health care provider what activities are safe for you. This information is not intended to replace advice given to you by your health care provider. Make sure you discuss any questions you have with your health care provider. Document Revised: 06/22/2020 Document Reviewed: 01/20/2020 Elsevier Patient Education  2021 [employer]. Peripheral Nerve Block  Peripheral nerve block is an injection of numbing medicine (regional anesthetic) near a nerve. The regional  anesthetic numbs everything below the injection site. This provides pain relief during and after a medical procedure. Generally, you will be awake while a peripheral nerve block is performed. You may also receive medicines to help you feel relaxed and comfortable during the procedure (sedatives). When you have a peripheral nerve block, you are not exposed to the risks associated with medicine that makes you fall asleep (general anesthetic). You may also:  Need less pain medicine after your procedure.  Have a lower risk of blood clots.  Recover sooner. Tell a health care provider about:  Any allergies you have.  All medicines you are taking, including vitamins, herbs, eye drops, creams, and over-the-counter medicines.  Any problems you or family members have had with anesthetic medicines.  Any blood disorders you have.  Any surgeries you have.  Any medical conditions you have.  Whether you are pregnant or may be pregnant. What are the risks? Generally, this is a safe procedure. However, problems may occur, including:  Infection.  Bleeding.  Allergic reactions to medicines.  Damage to surrounding structures or organs, such as temporary or permanent nerve damage. What happens before the procedure? Staying hydrated Follow instructions from your health care provider about hydration, which may include:  Up to 2 hours before the procedure - you may continue to drink clear liquids, such as water, clear fruit juice, black coffee, and plain tea. Eating and drinking restrictions Follow instructions from your health care provider about eating and drinking, which may include:  8 hours before the procedure - stop eating heavy meals or foods such as meat, fried foods, or fatty foods.  6 hours before the procedure - stop eating light meals or foods, such as toast or cereal.  6 hours before the procedure - stop drinking milk or drinks that contain milk.  2 hours before the procedure -  stop drinking clear liquids. General instructions  Ask your health care provider about: ? Changing or stopping your regular medicines. This is especially important if you are taking diabetes medicines or blood thinners. ? Taking medicines such as aspirin and ibuprofen. These medicines can thin your blood. Do not take these medicines before your procedure unless your health care provider tells you to. ? Taking over-the-counter medicines, vitamins, herbs, and supplements.  Plan to have someone take you home from the hospital or clinic.  Plan to have a responsible adult care for you for at least 24 hours after you leave the hospital or clinic. This is important. What happens during the procedure?  An IV will be inserted into one of your veins.  You may be given a sedative.  Your nerve may be located by using: ? Sound waves that create images of the area (ultrasound). ? A device that activates the nerve and causes your muscles to twitch (nerve stimulator).  The skin around your injection site will be cleaned with a germ-killing solution.  Medicine to numb your injection site (local anesthetic) may be injected into the tissue above your nerve.  Regional anesthetic will be injected into the area near  your nerve, not into it. ? You should not feel any pain. The area of your peripheral nerve block will begin to feel warm and numb.  A thin, flexible tube (catheter) may be inserted near your nerve. The catheter may remain there to continue delivering the regional anesthetic during and after your medical procedure.  When the area of your peripheral nerve block is completely numb, the medical procedure can be performed.  Your injection site may be covered with a bandage (dressing) when your medical procedure is done. The procedure may vary among health care providers and hospitals.   What can I expect after the procedure?  If you do not have a catheter, you may continue to be numb for up to  36 hours.  If you have a catheter in place, you will continue to be numb until the catheter is removed.  To reduce your risk of injury: ? Do not expose the numb area to heat or cold. ? Do not stand up or try to walk without help if you have a nerve block in one or both legs. Get help and limit your activity as told by your health care provider.  As the medicine wears off, you will have a gradual return of feeling in the area that is supplied by the nerve.  Your blood pressure, heart rate, breathing rate, and blood oxygen level will be monitored until the medicines you were given have worn off. Follow these instructions at home: Activity  Do not drive or use heavy machinery until your health care provider approves.  Do not lift anything that is heavier than 10 lb (4.5 kg), or the limit that you are told, until your health care provider says that it is safe. Injection site care  If you have a dressing, remove it 24 hours after your procedure, or as told by your health care provider.  Check your injection site every day for signs of infection. Check for: ? Redness, swelling, or pain. ? Warmth. ? Fluid or blood. ? Pus or a bad smell. General instructions  Take over-the-counter and prescription medicines only as told by your health care provider.  Do not take showers or baths, swim, or use a hot tub until your health care provider approves.  Keep all follow-up visits as told by your health care provider. This is important. Contact a health care provider if:  You have a fever.  You continue to have numbness, weakness, or tingling after 36 hours.  You have redness, swelling, or pain around your injection site. Get help right away if:  You have trouble breathing. Summary  Peripheral nerve block is an injection of numbing medicine (regional anesthetic) near a nerve. This provides pain relief during and after a medical procedure.  Feeling will gradually return to the area that is  supplied by the nerve.  Contact a health care provider if you continue to have numbness, weakness, or tingling after 36 hours. This information is not intended to replace advice given to you by your health care provider. Make sure you discuss any questions you have with your health care provider. Document Revised: 07/18/2020 Document Reviewed: 07/18/2020 Elsevier Patient Education  2021 [employer].

## 2020-12-05 NOTE — H&P (Signed)
Chief Complaint  Patient presents with  . Arm Injury      Right forearm fracture was at home working and piece of metal fell on it 11/16/20      Patient referred to [place] from the ER date of injury November 16, 2020.  Patient was working around his house piece of metal fell on his arm.  He fractured both radius and ulna.   Complains of severe pain.   He is on Suboxone 5/0.5   He also has a history of a blood clot for which she is on Xarelto which he stopped Friday   He complains of global numbness in his right hand since the injury   Review of Systems  Constitutional: Negative for chills and fever.  Musculoskeletal:       Chronic pain  All other systems reviewed and are negative.       Past Medical History:  Diagnosis Date  . Arthritis    . Asthma    . Bipolar 1 disorder ([place])    . Chronic knee pain    . DVT (deep venous thrombosis) ([place])    . Hypertension    . Insomnia    . Suicide attempt [facility])      2010 Intentional overdose attempt with Depakote.         Past Surgical History:  Procedure Laterality Date  . COLONOSCOPY WITH PROPOFOL N/A 03/18/2019    Procedure: COLONOSCOPY WITH PROPOFOL;  Surgeon: [name], MD;  [facility];  Service: Endoscopy;  Laterality: N/A;  2:30pm  . FOOT SURGERY        had peice of wire removed  . IVC FILTER REMOVAL N/A 02/24/2017    Procedure: IVC Filter Removal;  Surgeon: [name], MD;  Location: [place] [facility];  Service: Cardiovascular;  Laterality: N/A;  . PERIPHERAL VASCULAR CATHETERIZATION Right 10/22/2016    Procedure: Thrombectomy, thrombolyisis;  Surgeon: [name], MD;  Location: [facility];  Service: Cardiovascular;  Laterality: Right;  . PERIPHERAL VASCULAR CATHETERIZATION N/A 10/22/2016    Procedure: IVC Filter Insertion;  Surgeon: [name], MD;  Location: [place] [facility];  Service: Cardiovascular;  Laterality: N/A;  . POLYPECTOMY   03/18/2019    Procedure: POLYPECTOMY;  Surgeon:  [name], MD;  Location: [facility];  Service: Endoscopy;;         Family History  Problem Relation Age of Onset  . Cancer Mother    . Heart attack Father    . Colon cancer Neg Hx      Social History         Tobacco Use  . Smoking status: Current Every Day Smoker      Packs/day: 0.50      Types: Cigarettes  . Smokeless tobacco: Never Used  Vaping Use  . Vaping Use: Never used  Substance Use Topics  . Alcohol use: Yes      Comment: occasionally: on weekends (typically a pint on the weekend)  . Drug use: Yes      Types: Marijuana      Comment: smokes for pain    BP (!) 143/79   Pulse 76   Ht 5\' 11"  (1.803 m)   Wt 215 lb (97.5 kg)   BMI 29.99 kg/m    Physical Exam Vitals and nursing note reviewed.  Constitutional:      General: He is not in acute distress.    Appearance: Normal appearance. He is not  ill-appearing.  HENT:     Head: Normocephalic and atraumatic.  Eyes:     General: No scleral icterus.       Right eye: No discharge.        Left eye: No discharge.     Extraocular Movements: Extraocular movements intact.     Conjunctiva/sclera: Conjunctivae normal.     Pupils: Pupils are equal, round, and reactive to light.  Cardiovascular:     Rate and Rhythm: Normal rate and regular rhythm.     Pulses: Normal pulses.  Musculoskeletal:     Cervical back: Normal range of motion.     Comments: Right forearm tender mid forearm skin intact, alignment normal.  Patient having pain when he moves his fingers passive elbow range of motion is normal except for pain when moving the forearm  Skin:    General: Skin is warm and dry.     Capillary Refill: Capillary refill takes less than 2 seconds.  Neurological:     General: No focal deficit present.     Mental Status: He is alert and oriented to person, place, and time.     Comments: Radial, median, ulnar nerve tested sensation normal  Psychiatric:        Mood and Affect: Mood normal.        Behavior: Behavior  normal.        Thought Content: Thought content normal.        Judgment: Judgment normal.      Images that were read by me: Images at the ER showed both bones forearm fracture in the midshaft       Encounter Diagnosis  Name Primary?  . Closed fracture of distal ends of right radius and ulna, initial encounter Yes      Plan open reduction internal fixation right forearm with double plating   The procedure has been fully reviewed with the patient; The risks and benefits of surgery have been discussed and explained and understood. Alternative treatment has also been reviewed, questions were encouraged and answered. The postoperative plan is also been reviewed.

## 2020-12-05 NOTE — Anesthesia Preprocedure Evaluation (Signed)
Anesthesia Evaluation  Patient identified by MRN, date of birth, ID band Patient awake    Reviewed: Allergy & Precautions, NPO status , Patient's Chart, lab work & pertinent test results  History of Anesthesia Complications Negative for: history of anesthetic complications  Airway Mallampati: II  TM Distance: >3 FB Neck ROM: Full    Dental  (+) Dental Advisory Given, Poor Dentition, Missing, Chipped   Pulmonary asthma , sleep apnea , Current Smoker and Patient abstained from smoking., PE   Pulmonary exam normal breath sounds clear to auscultation       Cardiovascular Exercise Tolerance: Good hypertension, Pt. on medications + DVT  Normal cardiovascular exam Rhythm:Regular Rate:Normal     Neuro/Psych  Headaches, Seizures - (biting spells), Well Controlled,  PSYCHIATRIC DISORDERS Anxiety Depression Bipolar Disorder Cervical radiculopathy   Neuromuscular disease (numbness in middle finger, weak hand grip, right sided weakness and pain from spinal stenosis)    GI/Hepatic GERD  Controlled,(+)     substance abuse  alcohol use and marijuana use,   Endo/Other    Renal/GU      Musculoskeletal  (+) Arthritis  (spinal stenosis),   Abdominal   Peds  Hematology   Anesthesia Other Findings   Reproductive/Obstetrics                            Anesthesia Physical Anesthesia Plan  ASA: III  Anesthesia Plan: General   Post-op Pain Management:  Regional for Post-op pain   Induction: Intravenous  PONV Risk Score and Plan: 2 and Ondansetron, Dexamethasone and Midazolam  Airway Management Planned: LMA  Additional Equipment:   Intra-op Plan:   Post-operative Plan: Extubation in OR  Informed Consent: I have reviewed the patients History and Physical, chart, labs and discussed the procedure including the risks, benefits and alternatives for the proposed anesthesia with the patient or authorized  representative who has indicated his/her understanding and acceptance.     Dental advisory given  Plan Discussed with: CRNA and Surgeon  Anesthesia Plan Comments:         Anesthesia Quick Evaluation

## 2020-12-05 NOTE — Interval H&P Note (Signed)
History and Physical Interval Note:  12/05/2020 7:31 AM  Lance Cohen  has presented today for surgery, with the diagnosis of Both bones forearm fracture.  The various methods of treatment have been discussed with the patient and family. After consideration of risks, benefits and other options for treatment, the patient has consented to  Procedure(s): OPEN REDUCTION INTERNAL FIXATION (ORIF) RADIAL FRACTURE and ULNA FRACTURE (Right) as a surgical intervention.  The patient's history has been reviewed, patient examined, no change in status, stable for surgery.  I have reviewed the patient's chart and labs.  Questions were answered to the patient's satisfaction.     Arther Abbott

## 2020-12-05 NOTE — Op Note (Signed)
12/05/2020 9:30 AM  Operative report  50 year old male had a large object fall on his right forearm he sustained a both bones forearm fractures for surgery was delayed due to COVID-19 surgical restrictions  Presented with a 42-week-old both bones forearm fracture right upper extremity  Preop diagnosis both bones forearm fracture right upper extremity  Postop diagnosis same  Procedure open reduction internal fixation radius and ulnar shaft fractures  Surgeon Aline Brochure  Assisted by Fulton Mole  Complications  Partially healed fractures radius and ulna where the findings  Implants Synthes small fragment set 2, 6-hole plates all nonlocking screws except one proximal screw on the radius which was used secondary to using all of the 18 mm screws and the other holes  Surgery was done as follows  Preop the patient was seen in the preop area the surgical site was confirmed and marked chart was reviewed the implants were reviewed x-rays were available and reviewed He had a preop supraclavicular block He was taken to the operating room  He was intubated in supine position an armboard was used for the right arm a tourniquet was also used.  He was prepped and draped sterilely timeout was completed tourniquet was elevated after exsanguination of the limb with 4 inch Esmarch.  Tourniquet pressure 225 mm  I first made a volar approach using the Mallie Mussel approach to the radius.  We divided the skin subcutaneous tissue protected the artery opened up the flexor sheath and blunt dissection was carried out to the fracture site.  The fracture was debrided and then manually reduced and held with clamps and a C arm x-ray confirmed reduction.  I then took the 6-hole plate made a slight bend in it and then applied it to the radius with compression mode.  I only placed 2 screws.  Radiographs confirmed reduction  I then went to the ulna used a Thompson dorsal approach skin was divided subcutaneous tissue was  divided bone was identified and subperiosteal dissection remove the muscle from the bone.  The fracture was reduced with manual reduction.  Radiographs confirm that reduction.  I then placed a 6-hole plate in compression mode and got a good reduction.  I then tested the arm for pronation supination flexion extension it was intact and full  I then finished the ulnar screw holes and then the radial screw holes.  I then took x-rays to confirm the reduction which was anatomic and then checked his motion which was good.  I then irrigated all the wounds closed both wounds with 2-0 Monocryl sutures in running fashion followed by skin staples well-padded volar splint with the tourniquet released.  Sterile dressings were also placed prior to splinting  Good capillary refill and color were noted in the hand after releasing the tourniquet  Patient was extubated taken recovery room in stable condition  Postop plan is for a splint and active range of motion Staples out at 2 weeks x-rays at 2 weeks 6 weeks and 12 weeks

## 2020-12-05 NOTE — Anesthesia Postprocedure Evaluation (Signed)
Anesthesia Post Note  Patient: Lance Cohen  Procedure(s) Performed: OPEN REDUCTION INTERNAL FIXATION (ORIF) RADIAL FRACTURE and ULNA FRACTURE (Right Arm Lower)  Patient location during evaluation: PACU Anesthesia Type: Regional Level of consciousness: awake and alert and oriented Pain management: pain level controlled Vital Signs Assessment: post-procedure vital signs reviewed and stable Respiratory status: spontaneous breathing and respiratory function stable Cardiovascular status: blood pressure returned to baseline and stable Postop Assessment: no apparent nausea or vomiting Anesthetic complications: no   No complications documented.   Last Vitals:  Vitals:   12/05/20 1045 12/05/20 1055  BP: 96/61 96/61  Pulse: (!) 52 62  Resp: 15 14  Temp:  36.4 C  SpO2: 95% 97%    Last Pain:  Vitals:   12/05/20 1055  TempSrc: Oral  PainSc: 0-No pain                 Mianna Iezzi C Yong Grieser

## 2020-12-06 ENCOUNTER — Encounter (HOSPITAL_COMMUNITY): Payer: Self-pay | Admitting: Orthopedic Surgery

## 2020-12-08 ENCOUNTER — Other Ambulatory Visit: Payer: Self-pay

## 2020-12-08 ENCOUNTER — Encounter (HOSPITAL_COMMUNITY): Payer: Self-pay

## 2020-12-08 DIAGNOSIS — S40021A Contusion of right upper arm, initial encounter: Secondary | ICD-10-CM | POA: Insufficient documentation

## 2020-12-08 DIAGNOSIS — F17210 Nicotine dependence, cigarettes, uncomplicated: Secondary | ICD-10-CM | POA: Insufficient documentation

## 2020-12-08 DIAGNOSIS — X58XXXA Exposure to other specified factors, initial encounter: Secondary | ICD-10-CM | POA: Insufficient documentation

## 2020-12-08 DIAGNOSIS — Z86718 Personal history of other venous thrombosis and embolism: Secondary | ICD-10-CM | POA: Diagnosis not present

## 2020-12-08 DIAGNOSIS — Z7901 Long term (current) use of anticoagulants: Secondary | ICD-10-CM | POA: Diagnosis not present

## 2020-12-08 DIAGNOSIS — J45909 Unspecified asthma, uncomplicated: Secondary | ICD-10-CM | POA: Diagnosis not present

## 2020-12-08 DIAGNOSIS — Z79899 Other long term (current) drug therapy: Secondary | ICD-10-CM | POA: Diagnosis not present

## 2020-12-08 DIAGNOSIS — S4991XA Unspecified injury of right shoulder and upper arm, initial encounter: Secondary | ICD-10-CM | POA: Diagnosis present

## 2020-12-08 NOTE — ED Triage Notes (Signed)
Pt arrives via POV c/o right arm pain, swelling and bruising that began today following a recent reduction of right Ulna and Radius. Pt endorses taking Oxy 10mg , and Xrelto at home.

## 2020-12-09 ENCOUNTER — Emergency Department (HOSPITAL_COMMUNITY)
Admission: EM | Admit: 2020-12-09 | Discharge: 2020-12-09 | Disposition: A | Discharge status: Home / Self Care | Payer: Medicaid Other | Attending: Emergency Medicine | Admitting: Emergency Medicine

## 2020-12-09 ENCOUNTER — Emergency Department (HOSPITAL_COMMUNITY): Payer: Medicaid Other

## 2020-12-09 DIAGNOSIS — S40021A Contusion of right upper arm, initial encounter: Secondary | ICD-10-CM

## 2020-12-09 NOTE — Discharge Instructions (Addendum)
RETURN FOR ANY WORSENED SWELLING, OR ANY NEW NUMBNESS/WEAKNESS IN YOUR RIGHT HAND OVER THE NEXT 2 DAYS.  PLEASE KEEP ARM ELEVATED!

## 2020-12-09 NOTE — ED Provider Notes (Signed)
[facility] EMERGENCY DEPARTMENT Provider Note   CSN: [id number] Arrival date & time: 12/08/20  2206     History Chief Complaint  Patient presents with  . Arm Pain    Lance Cohen is a 50 y.o. male.   Arm Pain Pertinent negatives include no chest pain and no shortness of breath.      Patient with history of anxiety, chronic pain, bipolar, DVT presents with right arm bruising and pain.  Patient underwent right forearm surgery on February 15.  He reports that he restarted his Xarelto approximately 2 days ago.  Over the past 24 hours he has had increasing pain and bruising proximal to the splint.  He denies any numbness or weakness in his hand.  He denies any discoloration of his right hand.  He has tried to elevate the arm without improvement Denies any chest pain or shortness of breath Past Medical History:  Diagnosis Date  . Anxiety   . Arthritis   . Asthma   . Bipolar 1 disorder ([place])   . Chronic knee pain   . Depression   . DVT (deep venous thrombosis) ([place])   . Hypertension   . Insomnia   . Sleep apnea   . Spinal stenosis   . Suicide attempt [facility])    2010 Intentional overdose attempt with Depakote.    Patient Active Problem List   Diagnosis Date Noted  . Closed fracture of right forearm   . Daily headache 11/27/2020  . Dizzy spells 11/27/2020  . Seizure ([place]) 11/27/2020  . Myofascial pain dysfunction syndrome 11/22/2020  . Cocaine abuse ([place]) 11/18/2020  . Foreign body (FB) in soft tissue 09/08/2020  . Chronic low back pain 05/11/2020  . Shoulder pain 05/11/2020  . Headache 05/11/2020  . Cervical radiculopathy 02/18/2020  . Impingement syndrome of right shoulder 01/18/2020  . Rectal pain 06/29/2019  . GERD (gastroesophageal reflux disease) 06/29/2019  . Rectal bleeding 02/22/2019  . Priapism 02/08/2019  . Chest pain 10/19/2017  . Asthma 10/19/2017  . Hypertension 10/19/2017  . Arthralgia of right knee 10/19/2017  . Pain in limb 12/27/2016  . Acute  pulmonary embolism ([place]) 10/23/2016  . Nausea and vomiting 10/23/2016  . S/P IVC filter 10/23/2016  . Tobacco abuse counseling 10/23/2016  . DVT (deep venous thrombosis) ([place]) 10/18/2016  . Cellulitis of foot 06/24/2011  . Bipolar 1 disorder ([place]) 06/24/2011  . Tobacco abuse 06/24/2011  . Polycythemia 06/24/2011  . Tinea pedis 06/24/2011  . CARPAL TUNNEL SYNDROME, RIGHT 06/21/2010  . CONTUSION, LEFT FOOT 06/05/2010  . FOREIGN BODY, FOOT 04/17/2010    Past Surgical History:  Procedure Laterality Date  . COLONOSCOPY WITH PROPOFOL N/A 03/18/2019   Procedure: COLONOSCOPY WITH PROPOFOL;  Surgeon: [name], MD;  [facility];  Service: Endoscopy;  Laterality: N/A;  2:30pm  . FOOT SURGERY     had peice of wire removed  . IVC FILTER REMOVAL N/A 02/24/2017   Procedure: IVC Filter Removal;  Surgeon: [name], MD;  Location: [place] CV LAB;  Service: Cardiovascular;  Laterality: N/A;  . ORIF RADIAL FRACTURE Right 12/05/2020   Procedure: OPEN REDUCTION INTERNAL FIXATION (ORIF) RADIAL FRACTURE and ULNA FRACTURE;  Surgeon: Carole Civil, MD;  Location: [facility] ORS;  Service: Orthopedics;  Laterality: Right;  . PERIPHERAL VASCULAR CATHETERIZATION Right 10/22/2016   Procedure: Thrombectomy, thrombolyisis;  Surgeon: [name], MD;  Location: [place] CV LAB;  Service: Cardiovascular;  Laterality: Right;  . PERIPHERAL VASCULAR CATHETERIZATION N/A  10/22/2016   Procedure: IVC Filter Insertion;  Surgeon: [name], MD;  Location: [place] CV LAB;  Service: Cardiovascular;  Laterality: N/A;  . POLYPECTOMY  03/18/2019   Procedure: POLYPECTOMY;  Surgeon: [name], MD;  [facility];  Service: Endoscopy;;       Family History  Problem Relation Age of Onset  . Cancer Mother   . Heart attack Father   . Colon cancer Neg Hx     Social History   Tobacco Use  . Smoking status: Current Every Day Smoker    Packs/day: 0.50    Types:  Cigarettes  . Smokeless tobacco: Never Used  Vaping Use  . Vaping Use: Never used  Substance Use Topics  . Alcohol use: Yes    Comment: occasionally: on weekends (typically a pint on the weekend)  . Drug use: Yes    Types: Marijuana    Comment: smokes for pain    Home Medications Prior to Admission medications   Medication Sig Start Date End Date Taking? Authorizing Provider  allopurinol (ZYLOPRIM) 100 MG tablet Take 1 tablet (100 mg total) by mouth daily. 09/12/20   Rosemarie Ax, MD  buprenorphine-naloxone (SUBOXONE) 2-0.5 mg SUBL SL tablet Place 1 tablet under the tongue daily.    [provider]  gabapentin (NEURONTIN) 300 MG capsule Take 1 capsule (300 mg total) by mouth 3 (three) times daily as needed. 02/18/20   Rosemarie Ax, MD  oxyCODONE-acetaminophen (PERCOCET) 10-325 MG tablet Take 1 tablet by mouth every 4 (four) hours as needed for up to 7 days for pain. 12/05/20 12/12/20  Carole Civil, MD  pregabalin (LYRICA) 200 MG capsule Take 200 mg by mouth in the morning, at noon, and at bedtime.    [provider]  rivaroxaban (XARELTO) 20 MG TABS tablet Take 20 mg by mouth daily with breakfast.    [provider]  traMADol (ULTRAM) 50 MG tablet Take 100 mg by mouth every 8 (eight) hours as needed (PAIN). 07/19/20   [provider]    Allergies    Bee venom, Penicillins, and Sulfa antibiotics  Review of Systems   Review of Systems  Constitutional: Negative for fever.  Respiratory: Negative for shortness of breath.   Cardiovascular: Negative for chest pain.  Skin: Positive for color change.  Neurological: Negative for weakness and numbness.    Physical Exam Updated Vital Signs BP 105/60   Pulse 80   Temp 98 F (36.7 C)   Resp 18   Ht 1.803 m (5\' 11" )   Wt 99.8 kg   SpO2 95%   BMI 30.68 kg/m   Physical Exam CONSTITUTIONAL: Well developed/well nourished HEAD: Normocephalic/atraumatic EYES: EOMI ENMT: Mucous membranes  moist NECK: supple no meningeal signs CV: S1/S2 noted, no murmurs/rubs/gallops noted LUNGS:no apparent distress ABDOMEN: soft NEURO: Pt is awake/alert/appropriate, moves all extremitiesx4.  No facial droop.  He denies any sensory deficit to the right hand. EXTREMITIES: pulses normal/equal, full ROM Mild tenderness noted over the bruising proximal to the splint.  The right hand is warm to touch.  There is no discoloration to any of his fingers in the right hand.  He is able to move all his fingers on the right hand without difficulty.  Distal cap refill is less than 2 seconds.  No crepitus or fluctuance noted to the arm proximal to splint.  No significant overlying erythema. SKIN: warm, color normal PSYCH: no abnormalities of mood noted, alert and oriented to situation  Patient gave verbal permission to utilize photo for medical documentation only The image was not stored on any personal device  ED Results / Procedures / Treatments   Labs (all labs ordered are listed, but only abnormal results are displayed) Labs Reviewed - No data to display  EKG None  Radiology DG Forearm Right  Result Date: 12/09/2020 CLINICAL DATA:  Recent ORIF of ulna and radius. EXAM: RIGHT FOREARM - 2 VIEW COMPARISON:  None. FINDINGS: Postoperative changes of internal fixation of the distal radius and ulna. A splint and skin staples. No unexpected finding. IMPRESSION: Expected postoperative appearance of internal fixation of the distal radius and ulna. Electronically Signed   By: Ulyses Jarred M.D.   On: 12/09/2020 02:00    Procedures Procedures   Medications Ordered in ED Medications - No data to display  ED Course  I have reviewed the triage vital signs and the nursing notes.     MDM Rules/Calculators/A&P                          Patient underwent open reduction internal fixation of a both bone forearm fracture on February 15.  Patient restarted his Xarelto over the past 2 days.  Over the past 24  hours he has noted increasing bruising proximal to splint.  I suspect that this is a manifestation of the Xarelto postoperatively.  No new trauma was reported.  He denies any numbness or weakness in his right hand, and clinically he does not have signs of compartment syndrome.  No signs of any overlying infection or cellulitis I advised patient to elevate the arm as much as possible to help with edema and bruising. Final Clinical Impression(s) / ED Diagnoses Final diagnoses:  Superficial bruising of arm, right, initial encounter    Rx / DC Orders ED Discharge Orders    None       Ripley Fraise, MD 12/09/20 [phone]

## 2020-12-20 ENCOUNTER — Telehealth: Payer: Self-pay | Admitting: Radiology

## 2020-12-20 ENCOUNTER — Emergency Department (HOSPITAL_COMMUNITY)
Admission: EM | Admit: 2020-12-20 | Discharge: 2020-12-20 | Disposition: A | Discharge status: Home / Self Care | Payer: Medicaid Other | Attending: Emergency Medicine | Admitting: Emergency Medicine

## 2020-12-20 ENCOUNTER — Emergency Department (HOSPITAL_COMMUNITY): Payer: Medicaid Other

## 2020-12-20 ENCOUNTER — Other Ambulatory Visit: Payer: Self-pay

## 2020-12-20 ENCOUNTER — Telehealth: Payer: Self-pay | Admitting: Orthopedic Surgery

## 2020-12-20 DIAGNOSIS — J45909 Unspecified asthma, uncomplicated: Secondary | ICD-10-CM | POA: Insufficient documentation

## 2020-12-20 DIAGNOSIS — R202 Paresthesia of skin: Secondary | ICD-10-CM | POA: Diagnosis not present

## 2020-12-20 DIAGNOSIS — G8918 Other acute postprocedural pain: Secondary | ICD-10-CM

## 2020-12-20 DIAGNOSIS — Z7901 Long term (current) use of anticoagulants: Secondary | ICD-10-CM | POA: Diagnosis not present

## 2020-12-20 DIAGNOSIS — F17210 Nicotine dependence, cigarettes, uncomplicated: Secondary | ICD-10-CM | POA: Diagnosis not present

## 2020-12-20 DIAGNOSIS — I10 Essential (primary) hypertension: Secondary | ICD-10-CM | POA: Diagnosis not present

## 2020-12-20 NOTE — Discharge Instructions (Addendum)
You are seen in the emergency department for increased pain in your right forearm.  Your x-rays did not show any signs of hardware failure.  Your wounds do not look infected.  You were placed back in a splint.  Please follow-up with Dr. Aline Brochure tomorrow as scheduled.

## 2020-12-20 NOTE — ED Triage Notes (Signed)
Presents for concerns of right lower arm  Recent reduction of right ulna and radius. Reports he pulled his arm out his jacket last night and ever since he has had swelling and his fingers are numb/tingling

## 2020-12-20 NOTE — Telephone Encounter (Signed)
Patient is in ED Dr Melina Copa called wanting to discuss patient with you he is there now   Call him back please [phone]

## 2020-12-20 NOTE — ED Provider Notes (Signed)
[facility] EMERGENCY DEPARTMENT Provider Note   CSN: [id number] Arrival date & time: 12/20/20  4401     History Chief Complaint  Patient presents with  . Post-op Problem    Right arm     Lance Cohen is a 50 y.o. male.  He sustained a both bone fracture of his right forearm at the end of January and had surgery by Dr. Aline Brochure mid February.  He isl to see Dr. Aline Brochure tomorrow.  He said he was pulling his arm out of his jacket sleeve last night when he experienced increased pain in his forearm and swelling.  He took his splint off.  Complaining of pain mid forearm and swelling of his hands with some tingling.  No fevers.  No new trauma.  The history is provided by the patient.  Arm Injury Location:  Arm Arm location:  R forearm Injury: no   Pain details:    Quality:  Aching and throbbing   Radiates to:  Does not radiate   Severity:  Moderate   Onset quality:  Sudden   Duration:  24 hours   Timing:  Constant   Progression:  Unchanged Prior injury to area:  Yes Relieved by:  None tried Worsened by:  Movement Ineffective treatments:  None tried Associated symptoms: swelling and tingling   Associated symptoms: no fever        Past Medical History:  Diagnosis Date  . Anxiety   . Arthritis   . Asthma   . Bipolar 1 disorder ([place])   . Chronic knee pain   . Depression   . DVT (deep venous thrombosis) ([place])   . Hypertension   . Insomnia   . Sleep apnea   . Spinal stenosis   . Suicide attempt [facility])    2010 Intentional overdose attempt with Depakote.    Patient Active Problem List   Diagnosis Date Noted  . Closed fracture of right forearm   . Daily headache 11/27/2020  . Dizzy spells 11/27/2020  . Seizure ([place]) 11/27/2020  . Myofascial pain dysfunction syndrome 11/22/2020  . Cocaine abuse ([place]) 11/18/2020  . Foreign body (FB) in soft tissue 09/08/2020  . Chronic low back pain 05/11/2020  . Shoulder pain 05/11/2020  . Headache 05/11/2020  . Cervical  radiculopathy 02/18/2020  . Impingement syndrome of right shoulder 01/18/2020  . Rectal pain 06/29/2019  . GERD (gastroesophageal reflux disease) 06/29/2019  . Rectal bleeding 02/22/2019  . Priapism 02/08/2019  . Chest pain 10/19/2017  . Asthma 10/19/2017  . Hypertension 10/19/2017  . Arthralgia of right knee 10/19/2017  . Pain in limb 12/27/2016  . Acute pulmonary embolism ([place]) 10/23/2016  . Nausea and vomiting 10/23/2016  . S/P IVC filter 10/23/2016  . Tobacco abuse counseling 10/23/2016  . DVT (deep venous thrombosis) ([place]) 10/18/2016  . Cellulitis of foot 06/24/2011  . Bipolar 1 disorder ([place]) 06/24/2011  . Tobacco abuse 06/24/2011  . Polycythemia 06/24/2011  . Tinea pedis 06/24/2011  . CARPAL TUNNEL SYNDROME, RIGHT 06/21/2010  . CONTUSION, LEFT FOOT 06/05/2010  . FOREIGN BODY, FOOT 04/17/2010    Past Surgical History:  Procedure Laterality Date  . COLONOSCOPY WITH PROPOFOL N/A 03/18/2019   Procedure: COLONOSCOPY WITH PROPOFOL;  Surgeon: [name], MD;  [facility];  Service: Endoscopy;  Laterality: N/A;  2:30pm  . FOOT SURGERY     had peice of wire removed  . IVC FILTER REMOVAL N/A 02/24/2017   Procedure: IVC Filter Removal;  Surgeon: [name],  MD;  Location: [place] CV LAB;  Service: Cardiovascular;  Laterality: N/A;  . ORIF RADIAL FRACTURE Right 12/05/2020   Procedure: OPEN REDUCTION INTERNAL FIXATION (ORIF) RADIAL FRACTURE and ULNA FRACTURE;  Surgeon: Carole Civil, MD;  Location: [facility] OR[facility];  Service: Orthopedics;  Laterality: Right;  . PERIPHERAL VASCULAR CATHETERIZATION Right 10/22/2016   Procedure: Thrombectomy, thrombolyisis;  Surgeon: [name], MD;  Location: [place] CV LAB;  Service: Cardiovascular;  Laterality: Right;  . PERIPHERAL VASCULAR CATHETERIZATION N/A 10/22/2016   Procedure: IVC Filter Insertion;  Surgeon: [name], MD;  Location: [place] CV LAB;  Service: Cardiovascular;  Laterality: N/A;  .  POLYPECTOMY  03/18/2019   Procedure: POLYPECTOMY;  Surgeon: [name], MD;  [facility];  Service: Endoscopy;;       Family History  Problem Relation Age of Onset  . Cancer Mother   . Heart attack Father   . Colon cancer Neg Hx     Social History   Tobacco Use  . Smoking status: Current Every Day Smoker    Packs/day: 0.50    Types: Cigarettes  . Smokeless tobacco: Never Used  Vaping Use  . Vaping Use: Never used  Substance Use Topics  . Alcohol use: Yes    Comment: occasionally: on weekends (typically a pint on the weekend)  . Drug use: Yes    Types: Marijuana    Comment: smokes for pain    Home Medications Prior to Admission medications   Medication Sig Start Date End Date Taking? Authorizing Provider  allopurinol (ZYLOPRIM) 100 MG tablet Take 1 tablet (100 mg total) by mouth daily. 09/12/20  Yes Rosemarie Ax, MD  buprenorphine-naloxone (SUBOXONE) 2-0.5 mg SUBL SL tablet Place 1 tablet under the tongue daily.   Yes [provider]  gabapentin (NEURONTIN) 300 MG capsule Take 1 capsule (300 mg total) by mouth 3 (three) times daily as needed. 02/18/20  Yes Rosemarie Ax, MD  pregabalin (LYRICA) 200 MG capsule Take 200 mg by mouth in the morning, at noon, and at bedtime.   Yes [provider]  rivaroxaban (XARELTO) 20 MG TABS tablet Take 20 mg by mouth daily with breakfast.   Yes [provider]  traMADol (ULTRAM) 50 MG tablet Take 100 mg by mouth every 8 (eight) hours as needed (PAIN). 07/19/20   [provider]    Allergies    Bee venom, Penicillins, and Sulfa antibiotics  Review of Systems   Review of Systems  Constitutional: Negative for fever.  Skin: Positive for wound.  Neurological: Negative for weakness and numbness.    Physical Exam Updated Vital Signs BP 103/71 (BP Location: Left Arm)   Pulse 67   Temp 98.1 F (36.7 C) (Oral)   Resp 19   SpO2 96%   Physical Exam Vitals and nursing note  reviewed.  Constitutional:      Appearance: Normal appearance. He is well-developed and well-nourished.  HENT:     Head: Normocephalic and atraumatic.  Eyes:     Conjunctiva/sclera: Conjunctivae normal.  Pulmonary:     Effort: Pulmonary effort is normal.  Musculoskeletal:        General: Tenderness present.     Cervical back: Neck supple.     Comments: He has 2 surgical incisions have staples intact.  No surrounding erythema.  Diffuse tenderness through his mid forearm.  Distal pulses sensation and motor intact.  Skin:    General: Skin is warm and dry.  Capillary Refill: Capillary refill takes less than 2 seconds.  Neurological:     General: No focal deficit present.     Mental Status: He is alert.     GCS: GCS eye subscore is 4. GCS verbal subscore is 5. GCS motor subscore is 6.     Sensory: No sensory deficit.     Motor: No weakness.  Psychiatric:        Mood and Affect: Mood and affect normal.         ED Results / Procedures / Treatments   Labs (all labs ordered are listed, but only abnormal results are displayed) Labs Reviewed - No data to display  EKG None  Radiology DG Forearm Right  Result Date: 12/20/2020 CLINICAL DATA:  Postop pain, swelling.  Redness to right forearm EXAM: RIGHT FOREARM - 2 VIEW COMPARISON:  12/10/2019 FINDINGS: Plate and screw fixation noted across mid shaft right radius and ulnar fractures. Fracture lines remain visible. No significant visible bridging callus. No bone destruction or hardware complicating feature. Soft tissues are intact. IMPRESSION: Plate and screw fixation across midshaft right radius and ulnar fractures without visible healing. No hardware complicating feature. Electronically Signed   By: Rolm Baptise M.D.   On: 12/20/2020 10:23    Procedures Procedures   Medications Ordered in ED Medications - No data to display  ED Course  I have reviewed the triage vital signs and the nursing notes.  Pertinent labs & imaging  results that were available during my care of the patient were reviewed by me and considered in my medical decision making (see chart for details).  Clinical Course as of 12/20/20 1700  Wed Dec 20, 2020  1021 X-rays interpreted by me as hardware intact no acute changes.  Awaiting radiology reading [MB]  1209 Discussed with Dr. Aline Brochure orthopedics.  Recommended placing the patient in a short arm splint for and can see him in the office today or tomorrow. [MB]    Clinical Course User Index [MB] Hayden Rasmussen, MD   MDM Rules/Calculators/A&P                         Differential includes overuse, postoperative swelling, contusion, hardware failure, new fracture, compartment syndrome.  Clinically has got some focal swelling and tenderness but not throughout the whole compartment.  X-ray not showing any hardware disruption.  Reviewed with Dr. Aline Brochure and he asked if we placed the patient back in a volar splint.  Nurse placed in splint patient systems are normal.  Recommended close follow-up with orthopedics.  Return instructions discussed  Final Clinical Impression(s) / ED Diagnoses Final diagnoses:  Post-operative pain    Rx / DC Orders ED Discharge Orders    None       Hayden Rasmussen, MD 12/20/20 1712

## 2020-12-20 NOTE — Telephone Encounter (Signed)
Patient called and was a bit confused on where his appointment is tomorrow.  He said he the ED told him to go to Dr. Aline Brochure and they also told him to go to [facility].  I told him that I wasn't sure about what he was going to [facility] but that as directed by the ED, he needs to come and see Dr. Aline Brochure tomorrow.  I have asked him to be here at the office at 11:30

## 2020-12-21 ENCOUNTER — Ambulatory Visit (INDEPENDENT_AMBULATORY_CARE_PROVIDER_SITE_OTHER): Payer: Medicaid Other | Admitting: Orthopedic Surgery

## 2020-12-21 ENCOUNTER — Other Ambulatory Visit: Payer: Self-pay

## 2020-12-21 ENCOUNTER — Encounter: Payer: Self-pay | Admitting: Orthopedic Surgery

## 2020-12-21 ENCOUNTER — Ambulatory Visit: Payer: Medicaid Other | Admitting: Family Medicine

## 2020-12-21 DIAGNOSIS — S52501D Unspecified fracture of the lower end of right radius, subsequent encounter for closed fracture with routine healing: Secondary | ICD-10-CM

## 2020-12-21 DIAGNOSIS — S52601D Unspecified fracture of lower end of right ulna, subsequent encounter for closed fracture with routine healing: Secondary | ICD-10-CM

## 2020-12-21 DIAGNOSIS — S52601A Unspecified fracture of lower end of right ulna, initial encounter for closed fracture: Secondary | ICD-10-CM | POA: Insufficient documentation

## 2020-12-21 DIAGNOSIS — S52501A Unspecified fracture of the lower end of right radius, initial encounter for closed fracture: Secondary | ICD-10-CM | POA: Insufficient documentation

## 2020-12-21 NOTE — Progress Notes (Signed)
Chief Complaint  Patient presents with  . Post-op Follow-up    Forearm 12/05/20 staples removed patient has gone to ER x2 bruising/ better and pain     Postop day 16 ORIF both bone forearm.  Patient complains of swelling took his splint off went to the ER yesterday x-rays were fine I reviewed them everything looks to be intact his wound looks fine his staples were extracted  He is back on his Xarelto he is neurovascularly intact  We placed him in a removable Velcro forearm splint follow-up in 4 weeks for x-ray

## 2020-12-27 ENCOUNTER — Ambulatory Visit: Payer: Medicaid Other | Admitting: Family Medicine

## 2021-01-17 ENCOUNTER — Encounter (HOSPITAL_COMMUNITY): Payer: Self-pay

## 2021-01-17 ENCOUNTER — Emergency Department (HOSPITAL_COMMUNITY): Payer: Medicaid Other

## 2021-01-17 ENCOUNTER — Telehealth: Payer: Self-pay | Admitting: Orthopedic Surgery

## 2021-01-17 ENCOUNTER — Other Ambulatory Visit: Payer: Self-pay

## 2021-01-17 ENCOUNTER — Emergency Department (HOSPITAL_COMMUNITY)
Admission: EM | Admit: 2021-01-17 | Discharge: 2021-01-17 | Disposition: A | Discharge status: Home / Self Care | Payer: Medicaid Other | Attending: Emergency Medicine | Admitting: Emergency Medicine

## 2021-01-17 DIAGNOSIS — F1193 Opioid use, unspecified with withdrawal: Secondary | ICD-10-CM | POA: Insufficient documentation

## 2021-01-17 DIAGNOSIS — J40 Bronchitis, not specified as acute or chronic: Secondary | ICD-10-CM | POA: Insufficient documentation

## 2021-01-17 DIAGNOSIS — I10 Essential (primary) hypertension: Secondary | ICD-10-CM | POA: Insufficient documentation

## 2021-01-17 DIAGNOSIS — R0602 Shortness of breath: Secondary | ICD-10-CM | POA: Diagnosis present

## 2021-01-17 DIAGNOSIS — J45909 Unspecified asthma, uncomplicated: Secondary | ICD-10-CM | POA: Insufficient documentation

## 2021-01-17 DIAGNOSIS — Z86718 Personal history of other venous thrombosis and embolism: Secondary | ICD-10-CM | POA: Diagnosis not present

## 2021-01-17 DIAGNOSIS — F1123 Opioid dependence with withdrawal: Secondary | ICD-10-CM

## 2021-01-17 DIAGNOSIS — Z79899 Other long term (current) drug therapy: Secondary | ICD-10-CM | POA: Diagnosis not present

## 2021-01-17 DIAGNOSIS — Z20822 Contact with and (suspected) exposure to covid-19: Secondary | ICD-10-CM | POA: Insufficient documentation

## 2021-01-17 DIAGNOSIS — Z7901 Long term (current) use of anticoagulants: Secondary | ICD-10-CM | POA: Diagnosis not present

## 2021-01-17 DIAGNOSIS — F17210 Nicotine dependence, cigarettes, uncomplicated: Secondary | ICD-10-CM | POA: Insufficient documentation

## 2021-01-17 LAB — COMPREHENSIVE METABOLIC PANEL
ALT: 18 U/L (ref 0–44)
AST: 23 U/L (ref 15–41)
Albumin: 4 g/dL (ref 3.5–5.0)
Alkaline Phosphatase: 92 U/L (ref 38–126)
Anion gap: 12 (ref 5–15)
BUN: 15 mg/dL (ref 6–20)
CO2: 19 mmol/L — ABNORMAL LOW (ref 22–32)
Calcium: 9.2 mg/dL (ref 8.9–10.3)
Chloride: 105 mmol/L (ref 98–111)
Creatinine, Ser: 1.08 mg/dL (ref 0.61–1.24)
GFR, Estimated: >60 mL/min (ref >60)
Glucose, Bld: 114 mg/dL — ABNORMAL HIGH (ref 70–99)
Potassium: 3.4 mmol/L — ABNORMAL LOW (ref 3.5–5.1)
Sodium: 136 mmol/L (ref 135–145)
Total Bilirubin: 0.9 mg/dL (ref 0.3–1.2)
Total Protein: 7.2 g/dL (ref 6.5–8.1)

## 2021-01-17 LAB — LIPASE, BLOOD: Lipase: 29 U/L (ref 11–51)

## 2021-01-17 LAB — CBC
HCT: 45.7 % (ref 39.0–52.0)
Hemoglobin: 15.8 g/dL (ref 13.0–17.0)
MCH: 32.9 pg (ref 26.0–34.0)
MCHC: 34.6 g/dL (ref 30.0–36.0)
MCV: 95.2 fL (ref 80.0–100.0)
Platelets: 272 10*3/uL (ref 150–400)
RBC: 4.8 MIL/uL (ref 4.22–5.81)
RDW: 11.9 % (ref 11.5–15.5)
WBC: 9.8 10*3/uL (ref 4.0–10.5)
nRBC: 0 % (ref 0.0–0.2)

## 2021-01-17 LAB — RESP PANEL BY RT-PCR (FLU A&B, COVID) ARPGX2
Influenza A by PCR: NEGATIVE
Influenza B by PCR: NEGATIVE
SARS Coronavirus 2 by RT PCR: NEGATIVE

## 2021-01-17 MED ORDER — ONDANSETRON HCL 4 MG/2ML IJ SOLN
4.0000 mg | Freq: Once | INTRAMUSCULAR | Status: AC
  Administered 2021-01-17: 4 mg via INTRAVENOUS
  Filled 2021-01-17: qty 2

## 2021-01-17 MED ORDER — ALBUTEROL SULFATE HFA 108 (90 BASE) MCG/ACT IN AERS: 1.0000 | INHALATION_SPRAY | Freq: Four times a day (QID) | RESPIRATORY_TRACT | 0 refills | Status: DC | PRN

## 2021-01-17 MED ORDER — ONDANSETRON 8 MG PO TBDP: 8.0000 mg | ORAL_TABLET | Freq: Three times a day (TID) | ORAL | 0 refills | Status: DC | PRN

## 2021-01-17 MED ORDER — PREDNISONE 50 MG PO TABS
50.0000 mg | ORAL_TABLET | Freq: Every day | ORAL | 0 refills | Status: DC
Start: 2021-01-17 — End: 2021-03-23

## 2021-01-17 MED ORDER — HYDROMORPHONE HCL 1 MG/ML IJ SOLN
1.0000 mg | Freq: Once | INTRAMUSCULAR | Status: AC
  Administered 2021-01-17: 1 mg via INTRAVENOUS
  Filled 2021-01-17: qty 1

## 2021-01-17 NOTE — ED Notes (Signed)
Pt sitting in a chair talking on the phone, pt reports decreased pain, pt states that he is ready to go home.

## 2021-01-17 NOTE — Telephone Encounter (Signed)
Please call this patient and speak with him regarding symptoms that he is having.  He says he thinks it could be that he is allergic to metal or he could have a blood clot.  Pt is unsure what is going on. He had surgery on 12/05/20  Thanks so much

## 2021-01-17 NOTE — Telephone Encounter (Signed)
He is coming in tomorrow   I called him to see what is going on He states he has a blood clot in his groin, I asked him if he is using the Xarelto, he was breathing very hard and difficult to understand but states he is using the Xarelto. He said he is sweating.  I told him if he has concerns about blood clot he needs to go to the ER now. Asked if he has someone to take him he said he will call for Ambulance   To you [facility]

## 2021-01-17 NOTE — ED Notes (Signed)
Pt in bed with eyes closed, resps even and unlabored, pt placed on 4L O2 via [place] secondary to low O2 sat, pt now satting mid 90s on 4L

## 2021-01-17 NOTE — ED Triage Notes (Signed)
Per ems pt is here for sob since yesterday, states that they gave 2.5 of albuterol on the way and placed an 18 gauge iv in his L ac.    Pt states that he has a blood clot in his groin and he is having pain there, pt has moments of rapid breathing and moaning.  Pt states that he also has pain in his R arm from where he had surgery on his arm.  Pt denies sob.

## 2021-01-17 NOTE — ED Provider Notes (Signed)
[facility] EMERGENCY DEPARTMENT Provider Note   CSN: [id number] Arrival date & time: 01/17/21  1607     History Chief Complaint  Patient presents with  . Shortness of Breath    Lance Cohen is a 50 y.o. male.  HPI   Patient states he has history of chronic groin pain and arm pain.  He goes to a pain clinic.  He is prescribed Subutex.  Patient states he has been regularly going to his pain clinic.  He went there yesterday.  Patient states he ran out of his Subutex few days ago.  Since then he has been having issues with nausea vomiting he is also having increasing pain in his groin and his arm.  Patient does have a refill available but he was not able to pick it up.  He does have history of DVT in his groin.  He takes Xarelto regularly.  He has been coughing.  He denies any fevers or chills.  States he has been having some issues with shortness of breath and was given albuterol by EMS today.   Past Medical History:  Diagnosis Date  . Anxiety   . Arthritis   . Asthma   . Bipolar 1 disorder ([place])   . Chronic knee pain   . Depression   . DVT (deep venous thrombosis) ([place])   . Hypertension   . Insomnia   . Sleep apnea   . Spinal stenosis   . Suicide attempt [facility])    2010 Intentional overdose attempt with Depakote.    Patient Active Problem List   Diagnosis Date Noted  . Closed fracture of right distal radius and ulna 12/21/2020  . Closed fracture of right forearm   . Daily headache 11/27/2020  . Dizzy spells 11/27/2020  . Seizure ([place]) 11/27/2020  . Myofascial pain dysfunction syndrome 11/22/2020  . Cocaine abuse ([place]) 11/18/2020  . Foreign body (FB) in soft tissue 09/08/2020  . Chronic low back pain 05/11/2020  . Shoulder pain 05/11/2020  . Headache 05/11/2020  . Cervical radiculopathy 02/18/2020  . Impingement syndrome of right shoulder 01/18/2020  . Rectal pain 06/29/2019  . GERD (gastroesophageal reflux disease) 06/29/2019  . Rectal bleeding 02/22/2019  .  Priapism 02/08/2019  . Chest pain 10/19/2017  . Asthma 10/19/2017  . Hypertension 10/19/2017  . Arthralgia of right knee 10/19/2017  . Pain in limb 12/27/2016  . Acute pulmonary embolism ([place]) 10/23/2016  . Nausea and vomiting 10/23/2016  . S/P IVC filter 10/23/2016  . Tobacco abuse counseling 10/23/2016  . DVT (deep venous thrombosis) ([place]) 10/18/2016  . Cellulitis of foot 06/24/2011  . Bipolar 1 disorder ([place]) 06/24/2011  . Tobacco abuse 06/24/2011  . Polycythemia 06/24/2011  . Tinea pedis 06/24/2011  . CARPAL TUNNEL SYNDROME, RIGHT 06/21/2010  . CONTUSION, LEFT FOOT 06/05/2010  . FOREIGN BODY, FOOT 04/17/2010    Past Surgical History:  Procedure Laterality Date  . COLONOSCOPY WITH PROPOFOL N/A 03/18/2019   Procedure: COLONOSCOPY WITH PROPOFOL;  Surgeon: [name], MD;  Location[facility];  Service: Endoscopy;  Laterality: N/A;  2:30pm  . FOOT SURGERY     had peice of wire removed  . IVC FILTER REMOVAL N/A 02/24/2017   Procedure: IVC Filter Removal;  Surgeon: [name], MD;  Location: [place] CV LAB;  Service: Cardiovascular;  Laterality: N/A;  . ORIF RADIAL FRACTURE Right 12/05/2020   Procedure: OPEN REDUCTION INTERNAL FIXATION (ORIF) RADIAL FRACTURE and ULNA FRACTURE;  Surgeon: Carole Civil, MD;  Location: [facility] ORS;  Service: Orthopedics;  Laterality: Right;  . PERIPHERAL VASCULAR CATHETERIZATION Right 10/22/2016   Procedure: Thrombectomy, thrombolyisis;  Surgeon: [name], MD;  Location: [place] CV LAB;  Service: Cardiovascular;  Laterality: Right;  . PERIPHERAL VASCULAR CATHETERIZATION N/A 10/22/2016   Procedure: IVC Filter Insertion;  Surgeon: [name], MD;  Location: [place] CV LAB;  Service: Cardiovascular;  Laterality: N/A;  . POLYPECTOMY  03/18/2019   Procedure: POLYPECTOMY;  Surgeon: [name], MD;  Location: [facility];  Service: Endoscopy;;       Family History  Problem Relation Age of Onset  . Cancer  Mother   . Heart attack Father   . Colon cancer Neg Hx     Social History   Tobacco Use  . Smoking status: Current Every Day Smoker    Packs/day: 0.50    Types: Cigarettes  . Smokeless tobacco: Never Used  Vaping Use  . Vaping Use: Never used  Substance Use Topics  . Alcohol use: Yes  . Drug use: Yes    Types: Marijuana    Comment: smokes for pain    Home Medications Prior to Admission medications   Medication Sig Start Date End Date Taking? Authorizing Provider  albuterol (VENTOLIN HFA) 108 (90 Base) MCG/ACT inhaler Inhale 1-2 puffs into the lungs every 6 (six) hours as needed for wheezing or shortness of breath. 01/17/21  Yes Dorie Rank, MD  ondansetron (ZOFRAN ODT) 8 MG disintegrating tablet Take 1 tablet (8 mg total) by mouth every 8 (eight) hours as needed for nausea or vomiting. 01/17/21  Yes Dorie Rank, MD  predniSONE (DELTASONE) 50 MG tablet Take 1 tablet (50 mg total) by mouth daily. 01/17/21  Yes Dorie Rank, MD  allopurinol (ZYLOPRIM) 100 MG tablet Take 1 tablet (100 mg total) by mouth daily. 09/12/20   Rosemarie Ax, MD  buprenorphine-naloxone (SUBOXONE) 2-0.5 mg SUBL SL tablet Place 1 tablet under the tongue daily.    [provider]  doxepin (SINEQUAN) 10 MG capsule TAKE (1) CAPSULE BY MOUTHCONCE DAILY. 12/07/20   [provider]  gabapentin (NEURONTIN) 300 MG capsule Take 1 capsule (300 mg total) by mouth 3 (three) times daily as needed. 02/18/20   Rosemarie Ax, MD  hydrOXYzine (ATARAX/VISTARIL) 50 MG tablet Take 50 mg by mouth 3 (three) times daily as needed for anxiety or itching. 12/07/20   [provider]  pregabalin (LYRICA) 200 MG capsule Take 200 mg by mouth in the morning, at noon, and at bedtime.    [provider]  rivaroxaban (XARELTO) 20 MG TABS tablet Take 20 mg by mouth daily with breakfast.    [provider]  SUBOXONE 4-1 MG FILM Place 1 strip under the tongue 2 (two) times daily. 12/12/20   [provider]  traMADol (ULTRAM) 50 MG tablet Take 100 mg by mouth every 8 (eight) hours as needed (PAIN). 07/19/20   [provider]    Allergies    Bee venom, Penicillins, and Sulfa antibiotics  Review of Systems   Review of Systems  All other systems reviewed and are negative.   Physical Exam Updated Vital Signs BP 138/81   Pulse 64   Temp (!) 97.5 F (36.4 C) (Oral)   Resp 18   Ht 1.803 m (5\' 11" )   Wt 103 kg   SpO2 94%   BMI 31.66 kg/m   Physical Exam Vitals and nursing note reviewed.  Constitutional:  Appearance: He is well-developed. He is ill-appearing.  HENT:     Head: Normocephalic and atraumatic.     Right Ear: External ear normal.     Left Ear: External ear normal.  Eyes:     General: No scleral icterus.       Right eye: No discharge.        Left eye: No discharge.     Conjunctiva/sclera: Conjunctivae normal.  Neck:     Trachea: No tracheal deviation.  Cardiovascular:     Rate and Rhythm: Normal rate and regular rhythm.  Pulmonary:     Effort: Pulmonary effort is normal. No respiratory distress.     Breath sounds: Normal breath sounds. No stridor. No decreased breath sounds or rales.     Comments: Mild wheeze intermittently noted Abdominal:     General: Bowel sounds are normal. There is no distension.     Palpations: Abdomen is soft.     Tenderness: There is no abdominal tenderness. There is no guarding or rebound.  Musculoskeletal:        General: No tenderness.     Cervical back: Neck supple.     Right lower leg: No tenderness. No edema.     Left lower leg: No tenderness. No edema.  Skin:    General: Skin is warm and dry.     Findings: No rash.  Neurological:     Mental Status: He is alert.     Cranial Nerves: No cranial nerve deficit (no facial droop, extraocular movements intact, no slurred speech).     Sensory: No sensory deficit.     Motor: No abnormal muscle tone or seizure activity.     Coordination: Coordination normal.      ED Results / Procedures / Treatments   Labs (all labs ordered are listed, but only abnormal results are displayed) Labs Reviewed  COMPREHENSIVE METABOLIC PANEL - Abnormal; Notable for the following components:      Result Value   Potassium 3.4 (*)    CO2 19 (*)    Glucose, Bld 114 (*)    All other components within normal limits  RESP PANEL BY RT-PCR (FLU A&B, COVID) ARPGX2  CBC  LIPASE, BLOOD  URINALYSIS, ROUTINE W REFLEX MICROSCOPIC    EKG None  Radiology US Venous Img Lower Unilateral Right  Result Date: 01/17/2021 CLINICAL DATA:  Dyspnea, leg pain, DVT EXAM: RIGHT LOWER EXTREMITY VENOUS DOPPLER ULTRASOUND TECHNIQUE: Gray-scale sonography with compression, as well as color and duplex ultrasound, were performed to evaluate the deep venous system(s) from the level of the common femoral vein through the popliteal and proximal calf veins. COMPARISON:  08/09/2020 FINDINGS: VENOUS Normal compressibility of the common femoral, superficial femoral, and popliteal veins, as well as the visualized calf veins. Visualized portions of profunda femoral vein and great saphenous vein unremarkable. No filling defects to suggest DVT on grayscale or color Doppler imaging. Doppler waveforms show normal direction of venous flow, normal respiratory plasticity and response to augmentation. Limited views of the contralateral common femoral vein are unremarkable. OTHER None. Limitations: none IMPRESSION: 1. No evidence of deep venous thrombosis within the right lower extremity. 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 Electronically Signed   By: Randa Ngo M.D.   On: 01/17/2021 17:27   DG Chest Portable 1 View  Result Date: 01/17/2021 CLINICAL DATA:  DVT, short of breath since yesterday, dyspnea EXAM: PORTABLE CHEST 1 VIEW COMPARISON:  10/18/2017  FINDINGS: The heart size and mediastinal contours are within normal  limits. Both lungs are clear. The visualized skeletal structures are unremarkable. IMPRESSION: No active disease. Electronically Signed   By: Randa Ngo M.D.   On: 01/17/2021 16:41    Procedures Procedures   Medications Ordered in ED Medications  ondansetron (ZOFRAN) injection 4 mg (4 mg Intravenous Given 01/17/21 1631)  HYDROmorphone (DILAUDID) injection 1 mg (1 mg Intravenous Given 01/17/21 1633)    ED Course  I have reviewed the triage vital signs and the nursing notes.  Pertinent labs & imaging results that were available during my care of the patient were reviewed by me and considered in my medical decision making (see chart for details).  Clinical Course as of 01/17/21 Coral Ceo Jan 17, 2021  1805 Covid test is negative.  CBC is normal.  Lipase and metabolic panel are normal. [JK]  1806 DVT study negative. [JK]  1806 Chest x-ray without acute finding. [JK]  7356 Patient is feeling much better.  Feels ready for discharge. [JK]    Clinical Course User Index [JK] Dorie Rank, MD   MDM Rules/Calculators/A&P                          Patient presented to ED with complaints of shortness of breath nausea vomiting and pain.  Patient does have history of chronic pain syndrome and is on Subutex.  He does have prescriptions but was not able to pick up his refill so he was out his medication for the last couple days.  I suspect his nausea vomiting was related to opiate withdrawal.  Patient states his refill is available and he is able to pick it up today.  ED work-up is reassuring.  No signs of pneumonia.  He does not have evidence of a DVT.  Patient improved with breathing treatment.  He did have an oxygen requirement while in the ED but that has improved.  Think he stable for discharge home with steroids and continue breathing treatments.  Patient will plan to pick up his Subutex this afternoon. Final Clinical Impression(s) / ED Diagnoses Final diagnoses:  Opiate withdrawal ([place])   Bronchitis    Rx / DC Orders ED Discharge Orders         Ordered    ondansetron (ZOFRAN ODT) 8 MG disintegrating tablet  Every 8 hours PRN        01/17/21 1821    predniSONE (DELTASONE) 50 MG tablet  Daily        01/17/21 1821    albuterol (VENTOLIN HFA) 108 (90 Base) MCG/ACT inhaler  Every 6 hours PRN        01/17/21 1821           Dorie Rank, MD 01/17/21 [phone]

## 2021-01-17 NOTE — Discharge Instructions (Addendum)
Take the steroids as prescribed.  Continue your inhalers at home.  Make sure to pick up your Subutex today.  Follow-up with your doctor if the symptoms return or return to the ED as needed for worsening symptoms

## 2021-01-18 ENCOUNTER — Ambulatory Visit: Payer: Medicaid Other | Admitting: Orthopedic Surgery

## 2021-01-22 ENCOUNTER — Ambulatory Visit: Payer: Medicaid Other

## 2021-01-22 ENCOUNTER — Ambulatory Visit (INDEPENDENT_AMBULATORY_CARE_PROVIDER_SITE_OTHER): Payer: Medicaid Other | Admitting: Orthopedic Surgery

## 2021-01-22 ENCOUNTER — Encounter: Payer: Self-pay | Admitting: Orthopedic Surgery

## 2021-01-22 ENCOUNTER — Other Ambulatory Visit: Payer: Self-pay

## 2021-01-22 DIAGNOSIS — S52501D Unspecified fracture of the lower end of right radius, subsequent encounter for closed fracture with routine healing: Secondary | ICD-10-CM | POA: Diagnosis not present

## 2021-01-22 DIAGNOSIS — S52601D Unspecified fracture of lower end of right ulna, subsequent encounter for closed fracture with routine healing: Secondary | ICD-10-CM | POA: Diagnosis not present

## 2021-01-22 NOTE — Progress Notes (Signed)
Chief Complaint  Patient presents with  . Post-op Follow-up    Right forearm fracture/ states wrist painful / has weakness ORIF 12/05/20   50 year old male status post ORIF right forearm both bones forearm fracture 8 weeks ago.  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

## 2021-01-22 NOTE — Patient Instructions (Signed)
Start exercises as instructed   Wear brace

## 2021-01-30 ENCOUNTER — Encounter: Payer: Self-pay | Admitting: Family Medicine

## 2021-01-30 ENCOUNTER — Other Ambulatory Visit: Payer: Self-pay

## 2021-01-30 ENCOUNTER — Ambulatory Visit: Payer: Medicaid Other | Admitting: Family Medicine

## 2021-01-30 VITALS — BP 104/68 | Ht 71.0 in | Wt 227.0 lb

## 2021-01-30 DIAGNOSIS — M7918 Myalgia, other site: Secondary | ICD-10-CM

## 2021-01-30 NOTE — Progress Notes (Signed)
Lance Cohen - 50 y.o. male MRN [id number]  Date of birth: [birth year]  SUBJECTIVE:  Including CC & ROS.  No chief complaint on file.   Lance Cohen is a 50 y.o. male that is presenting with multiple body aches and pains.  He recently had surgery for his right arm.  He had opiate withdrawal and was seen in the emergency department recently.  He would like to transition away from medications as much as he can.   Review of Systems See HPI   HISTORY: Past Medical, Surgical, Social, and Family History Reviewed & Updated per EMR.   Pertinent Historical Findings include:  Past Medical History:  Diagnosis Date  . Anxiety   . Arthritis   . Asthma   . Bipolar 1 disorder ([place])   . Chronic knee pain   . Depression   . DVT (deep venous thrombosis) ([place])   . Hypertension   . Insomnia   . Sleep apnea   . Spinal stenosis   . Suicide attempt [facility])    2010 Intentional overdose attempt with Depakote.    Past Surgical History:  Procedure Laterality Date  . COLONOSCOPY WITH PROPOFOL N/A 03/18/2019   Procedure: COLONOSCOPY WITH PROPOFOL;  Surgeon: [name], MD;  [facility];  Service: Endoscopy;  Laterality: N/A;  2:30pm  . FOOT SURGERY     had peice of wire removed  . IVC FILTER REMOVAL N/A 02/24/2017   Procedure: IVC Filter Removal;  Surgeon: [name], MD;  Location: [place] CV LAB;  Service: Cardiovascular;  Laterality: N/A;  . ORIF RADIAL FRACTURE Right 12/05/2020   Procedure: OPEN REDUCTION INTERNAL FIXATION (ORIF) RADIAL FRACTURE and ULNA FRACTURE;  Surgeon: Carole Civil, MD;  Location: [facility] ORS;  Service: Orthopedics;  Laterality: Right;  . PERIPHERAL VASCULAR CATHETERIZATION Right 10/22/2016   Procedure: Thrombectomy, thrombolyisis;  Surgeon: [name], MD;  Location: [place] CV LAB;  Service: Cardiovascular;  Laterality: Right;  . PERIPHERAL VASCULAR CATHETERIZATION N/A 10/22/2016   Procedure: IVC Filter Insertion;  Surgeon: [name], MD;  Location: [place] CV LAB;  Service: Cardiovascular;  Laterality: N/A;  . POLYPECTOMY  03/18/2019   Procedure: POLYPECTOMY;  Surgeon: [name], MD;  [facility];  Service: Endoscopy;;    Family History  Problem Relation Age of Onset  . Cancer Mother   . Heart attack Father   . Colon cancer Neg Hx     Social History   Socioeconomic History  . Marital status: Married    Spouse name: Not on file  . Number of children: Not on file  . Years of education: Not on file  . Highest education level: Not on file  Occupational History  . Not on file  Tobacco Use  . Smoking status: Current Every Day Smoker    Packs/day: 0.50    Types: Cigarettes  . Smokeless tobacco: Never Used  Vaping Use  . Vaping Use: Never used  Substance and Sexual Activity  . Alcohol use: Yes  . Drug use: Yes    Types: Marijuana    Comment: smokes for pain  . Sexual activity: Yes  Other Topics Concern  . Not on file  Social History Narrative  . Not on file   Social Determinants of Health   Financial Resource Strain: Not on file  Food Insecurity: Not on file  Transportation Needs: Not on file  Physical Activity: Not on file  Stress: Not on file  Social Connections: Not on file  Intimate Partner Violence: Not on file     PHYSICAL EXAM:  VS: BP 104/68 (BP Location: Left Arm, Patient Position: Sitting, Cuff Size: Large)   Ht 5\' 11"  (1.803 m)   Wt 227 lb (103 kg)   BMI 31.66 kg/m  Physical Exam Gen: NAD, alert, cooperative with exam, well-appearing    ASSESSMENT & PLAN:   Myofascial pain dysfunction syndrome Having pain in multiple areas has been treated with opiates in the past.  He would like to transition away from this is much as possible. -Counseled on home exercise therapy and supportive care. -Referral to integrative medicine.

## 2021-01-31 NOTE — Assessment & Plan Note (Signed)
Having pain in multiple areas has been treated with opiates in the past.  He would like to transition away from this is much as possible. -Counseled on home exercise therapy and supportive care. -Referral to integrative medicine.

## 2021-02-05 ENCOUNTER — Telehealth: Payer: Self-pay | Admitting: Orthopedic Surgery

## 2021-02-05 NOTE — Telephone Encounter (Signed)
Patient called to relay that his arm hurts, and feels like he is getting a 'knot' in the area around where his surgery was done, 12/05/20 (ORIF radial and ulnar fracture). Patient was seen by Dr Aline Brochure 01/30/21, and due back 02/19/21; notes indicate referral was made to integrative medicine/orthopaedic sports medicine at time of recent visit. Please advise.

## 2021-02-05 NOTE — Telephone Encounter (Signed)
Bring him in tomorrow am with Dr Luna Glasgow for eval.  If he cannot wait til then, he needs to go to the ED for eval.

## 2021-02-06 NOTE — Telephone Encounter (Signed)
Have not reached as of yet.

## 2021-02-08 NOTE — Telephone Encounter (Signed)
Reached patient; scheduled into cancellation slot with Dr Aline Brochure. Aware of appointment.

## 2021-02-12 ENCOUNTER — Encounter: Payer: Self-pay | Admitting: Orthopedic Surgery

## 2021-02-12 ENCOUNTER — Ambulatory Visit (INDEPENDENT_AMBULATORY_CARE_PROVIDER_SITE_OTHER): Payer: Medicaid Other | Admitting: Orthopedic Surgery

## 2021-02-12 ENCOUNTER — Other Ambulatory Visit: Payer: Self-pay

## 2021-02-12 ENCOUNTER — Ambulatory Visit: Payer: Medicaid Other

## 2021-02-12 DIAGNOSIS — S52501D Unspecified fracture of the lower end of right radius, subsequent encounter for closed fracture with routine healing: Secondary | ICD-10-CM | POA: Diagnosis not present

## 2021-02-12 DIAGNOSIS — S52601D Unspecified fracture of lower end of right ulna, subsequent encounter for closed fracture with routine healing: Secondary | ICD-10-CM | POA: Diagnosis not present

## 2021-02-12 NOTE — Progress Notes (Signed)
Chief Complaint  Patient presents with  . Post-op Follow-up    Right forearm states he has a knot on forearm near incision was worse but has gotten a little better       DOS 12/05/20/ORIF BBFA   C/O KNOT ON THE FOREARM, HAS DECRESSED IN SIZE AND NO WND DRAINAGE   CLINICALLY LOOKS NORMAL HE DOES HAVE SOME PALMAR NUMBNESS THAT DOES NOT EXTEND INTO THE FINGERS   HE HAS HETERO TOPIC BONE BETW THE RAD AND ULNA  XRAYS NORMAL XC THE HETEROTOPIC BONE   WE LL SEE HIM MAY 16 INSTEAD OF NEXT WEEK

## 2021-02-14 NOTE — Progress Notes (Deleted)
MRN : [id number]  MONTEZ CUDA is a 50 y.o. ([birth year]) male who presents with chief complaint of No chief complaint on file. Marland Kitchen  History of Present Illness:   The patient presents to the office for evaluation of DVT.  DVT was identified at [facility] by Duplex ultrasound.  The initial symptoms were pain and swelling in the lower extremity.  Past interventions 10/22/2016: Placement of a Ceslect IVC filter infrarenal Mechanical thrombectomy of the popliteal, SFV common femoral vein using the Penumbra catheter Percutaneous transluminal angioplasty to 7 mm right superficial femoral vein and common femoral vein  02/24/2017: Retrieval of Cook Celect IVC filter  The patient notes the leg continues to be very painful with dependency and swells quite a bite.  Symptoms are much better with elevation.  The patient notes minimal edema in the morning which steadily worsens throughout the day.    The patient has not been using compression therapy at this point.  No SOB or pleuritic chest pains.  No cough or hemoptysis.  No blood per rectum or blood in any sputum.  No excessive bruising per the patient.       No outpatient medications have been marked as taking for the 02/15/21 encounter (Appointment) with Delana Meyer, Dolores Lory, MD.    Past Medical History:  Diagnosis Date  . Anxiety   . Arthritis   . Asthma   . Bipolar 1 disorder ([place])   . Chronic knee pain   . Depression   . DVT (deep venous thrombosis) ([place])   . Hypertension   . Insomnia   . Sleep apnea   . Spinal stenosis   . Suicide attempt [facility])    2010 Intentional overdose attempt with Depakote.    Past Surgical History:  Procedure Laterality Date  . COLONOSCOPY WITH PROPOFOL N/A 03/18/2019   Procedure: COLONOSCOPY WITH PROPOFOL;  Surgeon: [name], MD;  [facility];  Service: Endoscopy;  Laterality: N/A;  2:30pm  . FOOT SURGERY     had peice of wire removed  . IVC FILTER REMOVAL N/A 02/24/2017   Procedure:  IVC Filter Removal;  Surgeon: [name], MD;  Location: [place] CV LAB;  Service: Cardiovascular;  Laterality: N/A;  . ORIF RADIAL FRACTURE Right 12/05/2020   Procedure: OPEN REDUCTION INTERNAL FIXATION (ORIF) RADIAL FRACTURE and ULNA FRACTURE;  Surgeon: Carole Civil, MD;  Location: [facility] ORS;  Service: Orthopedics;  Laterality: Right;  . PERIPHERAL VASCULAR CATHETERIZATION Right 10/22/2016   Procedure: Thrombectomy, thrombolyisis;  Surgeon: [name], MD;  Location: [place] [facility] LAB;  Service: Cardiovascular;  Laterality: Right;  . PERIPHERAL VASCULAR CATHETERIZATION N/A 10/22/2016   Procedure: IVC Filter Insertion;  Surgeon: [name], MD;  Location: [place] CV LAB;  Service: Cardiovascular;  Laterality: N/A;  . POLYPECTOMY  03/18/2019   Procedure: POLYPECTOMY;  Surgeon: [name], MD;  [facility];  Service: Endoscopy;;    Social History Social History   Tobacco Use  . Smoking status: Current Every Day Smoker    Packs/day: 0.50    Types: Cigarettes  . Smokeless tobacco: Never Used  Vaping Use  . Vaping Use: Never used  Substance Use Topics  . Alcohol use: Yes  . Drug use: Yes    Types: Marijuana    Comment: smokes for pain    Family History Family History  Problem Relation Age of Onset  . Cancer Mother   . Heart attack Father   . Colon  cancer Neg Hx     Allergies  Allergen Reactions  . Bee Venom Swelling  . Penicillins Other (See Comments)    PT states that he had symptoms that felt like he was having a heart attack Has patient had a PCN reaction causing immediate rash, facial/tongue/throat swelling, SOB or lightheadedness with hypotension: No Has patient had a PCN reaction causing severe rash involving mucus membranes or skin necrosis: No Has patient had a PCN reaction that required hospitalization No Has patient had a PCN reaction occurring within the last 10 years: No If all of the above answers are "NO", then may  proceed with Cephal  . Sulfa Antibiotics Itching     REVIEW OF SYSTEMS (Negative unless checked)  Constitutional: [] Weight loss  [] Fever  [] Chills Cardiac: [] Chest pain   [] Chest pressure   [] Palpitations   [] Shortness of breath when laying flat   [] Shortness of breath with exertion. Vascular:  [] Pain in legs with walking   [x] Pain in legs at rest  [x] History of DVT   [] Phlebitis   [x] Swelling in legs   [] Varicose veins   [] Non-healing ulcers Pulmonary:   [] Uses home oxygen   [] Productive cough   [] Hemoptysis   [] Wheeze  [] COPD   [] Asthma Neurologic:  [] Dizziness   [] Seizures   [] History of stroke   [] History of TIA  [] Aphasia   [] Vissual changes   [] Weakness or numbness in arm   [] Weakness or numbness in leg Musculoskeletal:   [] Joint swelling   [x] Joint pain   [] Low back pain Hematologic:  [] Easy bruising  [] Easy bleeding   [] Hypercoagulable state   [] Anemic Gastrointestinal:  [] Diarrhea   [] Vomiting  [] Gastroesophageal reflux/heartburn   [] Difficulty swallowing. Genitourinary:  [] Chronic kidney disease   [] Difficult urination  [] Frequent urination   [] Blood in urine Skin:  [x] Rashes   [] Ulcers  Psychological:  [] History of anxiety   []  History of major depression.  Physical Examination  There were no vitals filed for this visit. There is no height or weight on file to calculate BMI. Gen: WD/WN, NAD Head: [place]/AT, No temporalis wasting.  Ear/Nose/Throat: Hearing grossly intact, nares w/o erythema or drainage Eyes: PER, EOMI, sclera nonicteric.  Neck: Supple, no large masses.   Pulmonary:  Good air movement, no audible wheezing bilaterally, no use of accessory muscles.  Cardiac: RRR, no JVD Vascular: scattered varicosities present bilaterally.  Mild venous stasis changes to the legs bilaterally.  2+ soft pitting edema Vessel Right Left  Radial Palpable Palpable  Gastrointestinal: Non-distended. No guarding/no peritoneal signs.  Musculoskeletal: M/S 5/5 throughout.  No deformity or  atrophy.  Neurologic: CN 2-12 intact. Symmetrical.  Speech is fluent. Motor exam as listed above. Psychiatric: Judgment intact, Mood & affect appropriate for pt's clinical situation. Dermatologic: Venous rashes no ulcers noted.  No changes consistent with cellulitis. Lymph : + lichenification or skin changes of chronic lymphedema.  CBC Lab Results  Component Value Date   WBC 9.8 01/17/2021   HGB 15.8 01/17/2021   HCT 45.7 01/17/2021   MCV 95.2 01/17/2021   PLT 272 01/17/2021    BMET    Component Value Date/Time   NA 136 01/17/2021 1623   K 3.4 (L) 01/17/2021 1623   CL 105 01/17/2021 1623   CO2 19 (L) 01/17/2021 1623   GLUCOSE 114 (H) 01/17/2021 1623   BUN 15 01/17/2021 1623   CREATININE 1.08 01/17/2021 1623   CALCIUM 9.2 01/17/2021 1623   GFRNONAA >60 01/17/2021 1623   GFRAA >60 07/03/2019 1345   CrCl  cannot be calculated (Patient's most recent lab result is older than the maximum 21 days allowed.).  COAG Lab Results  Component Value Date   INR 1.11 10/22/2016   INR 1.06 10/18/2016    Radiology DG Forearm Right  Result Date: 02/12/2021 [facility] Radiology Report Dictated by Dr. Aline Brochure Chief complaint  S/P ORIF BBFA FRACTURE RIGHT C/O A KNOT ON HIS SKIN Images: AP LAT RT FOREARM ANATOMIC ALIGNMENT, OF BOTH BONES WITH 2 PLATES AND MULTIPLES SCREWS WHICH ARE UNCHANGED FROM PRIOR FILMS THE HETEROTOPIC BONE HAS STABILIZED IMPRESSION EXPECTED APPEARANCE OF THE ORIF OF BFA   DG Forearm Right  Result Date: 01/22/2021 [facility] Radiology Report Dictated by Dr. 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 COMPARISON:  08/09/2020 FINDINGS: VENOUS Normal compressibility of the common femoral, superficial femoral, and popliteal veins, as well as the visualized calf veins. Visualized portions of profunda femoral vein and great saphenous vein unremarkable. No filling defects to suggest DVT on grayscale or color Doppler imaging. Doppler waveforms show normal direction of venous flow, normal respiratory plasticity and response to augmentation. Limited views of the contralateral common femoral vein are unremarkable. OTHER None. Limitations: none IMPRESSION: 1. No evidence of deep venous thrombosis within the right lower extremity. 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 Electronically Signed   By: Randa Ngo M.D.   On: 01/17/2021 17:27   DG Chest Portable 1 View  Result Date: 01/17/2021 CLINICAL DATA:  DVT, short of breath since yesterday, dyspnea EXAM: PORTABLE CHEST 1 VIEW COMPARISON:  10/18/2017 FINDINGS: The heart size and mediastinal contours are within normal limits. Both lungs are clear. The visualized skeletal structures are unremarkable. IMPRESSION: No active disease. Electronically Signed   By: Randa Ngo M.D.   On: 01/17/2021 16:41     Assessment/Plan There are no diagnoses linked to this encounter.   Hortencia Pilar, MD  02/14/2021 4:30 PM

## 2021-02-15 ENCOUNTER — Encounter (INDEPENDENT_AMBULATORY_CARE_PROVIDER_SITE_OTHER): Payer: Self-pay | Admitting: Vascular Surgery

## 2021-02-15 ENCOUNTER — Ambulatory Visit (INDEPENDENT_AMBULATORY_CARE_PROVIDER_SITE_OTHER): Payer: Medicaid Other | Admitting: Vascular Surgery

## 2021-02-15 DIAGNOSIS — J45909 Unspecified asthma, uncomplicated: Secondary | ICD-10-CM

## 2021-02-15 DIAGNOSIS — I10 Essential (primary) hypertension: Secondary | ICD-10-CM

## 2021-02-15 DIAGNOSIS — I82511 Chronic embolism and thrombosis of right femoral vein: Secondary | ICD-10-CM

## 2021-02-19 ENCOUNTER — Ambulatory Visit: Payer: Medicaid Other | Admitting: Orthopedic Surgery

## 2021-03-05 ENCOUNTER — Encounter: Payer: Self-pay | Admitting: Orthopedic Surgery

## 2021-03-05 ENCOUNTER — Other Ambulatory Visit: Payer: Self-pay

## 2021-03-05 ENCOUNTER — Ambulatory Visit: Payer: Medicaid Other

## 2021-03-05 ENCOUNTER — Ambulatory Visit (INDEPENDENT_AMBULATORY_CARE_PROVIDER_SITE_OTHER): Payer: Medicaid Other | Admitting: Orthopedic Surgery

## 2021-03-05 DIAGNOSIS — S52601D Unspecified fracture of lower end of right ulna, subsequent encounter for closed fracture with routine healing: Secondary | ICD-10-CM | POA: Diagnosis not present

## 2021-03-05 DIAGNOSIS — S52501D Unspecified fracture of the lower end of right radius, subsequent encounter for closed fracture with routine healing: Secondary | ICD-10-CM

## 2021-03-05 NOTE — Progress Notes (Signed)
Chief Complaint  Patient presents with  . Post-op Follow-up    Right forearm surgery states wrist pain 12/05/20     12 weeks postop his primary complaint is wrist pain  His pain is at the radial side of his wrist.  His x-ray shows some arthritis there between the radius and scaphoid  His fracture healed nicely.  He has decreased rotation of the forearm which we believed to be secondary to the heterotopic bone  There was a moderate amount of heterotopic bone on x-ray  We will see him in 3 months for x-ray

## 2021-03-05 NOTE — Patient Instructions (Signed)
Brace can be removed

## 2021-03-21 ENCOUNTER — Other Ambulatory Visit: Payer: Self-pay

## 2021-03-21 ENCOUNTER — Ambulatory Visit (INDEPENDENT_AMBULATORY_CARE_PROVIDER_SITE_OTHER): Payer: Medicaid Other | Admitting: Family Medicine

## 2021-03-21 ENCOUNTER — Encounter: Payer: Self-pay | Admitting: Family Medicine

## 2021-03-21 VITALS — BP 140/82 | Ht 71.0 in | Wt 227.0 lb

## 2021-03-21 DIAGNOSIS — S52501D Unspecified fracture of the lower end of right radius, subsequent encounter for closed fracture with routine healing: Secondary | ICD-10-CM | POA: Diagnosis not present

## 2021-03-21 DIAGNOSIS — S52601D Unspecified fracture of lower end of right ulna, subsequent encounter for closed fracture with routine healing: Secondary | ICD-10-CM

## 2021-03-21 NOTE — Patient Instructions (Signed)
Good to see you I'll call with the results from today   Please send me a message in MyChart with any questions or updates.  Follow up will depend on results.   --Dr. Raeford Razor

## 2021-03-21 NOTE — Progress Notes (Signed)
Lance Cohen - 50 y.o. male MRN [id number]  Date of birth: [birth year]  SUBJECTIVE:  Including CC & ROS.  No chief complaint on file.   Lance Cohen is a 50 y.o. male that is presenting with pain in his right forearm following his surgical fixation.  He reports having nausea and upset stomach.  He remembers feeling this way shortly after the surgery.  He has concerns that he has toxicity due to the implant.  Review of Systems See HPI   HISTORY: Past Medical, Surgical, Social, and Family History Reviewed & Updated per EMR.   Pertinent Historical Findings include:  Past Medical History:  Diagnosis Date  . Anxiety   . Arthritis   . Asthma   . Bipolar 1 disorder ([place])   . Chronic knee pain   . Depression   . DVT (deep venous thrombosis) ([place])   . Hypertension   . Insomnia   . Sleep apnea   . Spinal stenosis   . Suicide attempt [facility])    2010 Intentional overdose attempt with Depakote.    Past Surgical History:  Procedure Laterality Date  . COLONOSCOPY WITH PROPOFOL N/A 03/18/2019   Procedure: COLONOSCOPY WITH PROPOFOL;  Surgeon: [name], MD;  [facility];  Service: Endoscopy;  Laterality: N/A;  2:30pm  . FOOT SURGERY     had peice of wire removed  . IVC FILTER REMOVAL N/A 02/24/2017   Procedure: IVC Filter Removal;  Surgeon: [name], MD;  Location: [place] CV LAB;  Service: Cardiovascular;  Laterality: N/A;  . ORIF RADIAL FRACTURE Right 12/05/2020   Procedure: OPEN REDUCTION INTERNAL FIXATION (ORIF) RADIAL FRACTURE and ULNA FRACTURE;  Surgeon: Carole Civil, MD;  Location: [facility];  Service: Orthopedics;  Laterality: Right;  . PERIPHERAL VASCULAR CATHETERIZATION Right 10/22/2016   Procedure: Thrombectomy, thrombolyisis;  Surgeon: [name], MD;  Location: [place] [facility] LAB;  Service: Cardiovascular;  Laterality: Right;  . PERIPHERAL VASCULAR CATHETERIZATION N/A 10/22/2016   Procedure: IVC Filter Insertion;  Surgeon: [name],  MD;  Location: [place] CV LAB;  Service: Cardiovascular;  Laterality: N/A;  . POLYPECTOMY  03/18/2019   Procedure: POLYPECTOMY;  Surgeon: [name], MD;  [facility];  Service: Endoscopy;;    Family History  Problem Relation Age of Onset  . Cancer Mother   . Heart attack Father   . Colon cancer Neg Hx     Social History   Socioeconomic History  . Marital status: Married    Spouse name: Not on file  . Number of children: Not on file  . Years of education: Not on file  . Highest education level: Not on file  Occupational History  . Not on file  Tobacco Use  . Smoking status: Current Every Day Smoker    Packs/day: 0.50    Types: Cigarettes  . Smokeless tobacco: Never Used  Vaping Use  . Vaping Use: Never used  Substance and Sexual Activity  . Alcohol use: Yes  . Drug use: Yes    Types: Marijuana    Comment: smokes for pain  . Sexual activity: Yes  Other Topics Concern  . Not on file  Social History Narrative  . Not on file   Social Determinants of Health   Financial Resource Strain: Not on file  Food Insecurity: Not on file  Transportation Needs: Not on file  Physical Activity: Not on file  Stress: Not on file  Social Connections: Not  on file  Intimate Partner Violence: Not on file     PHYSICAL EXAM:  VS: BP 140/82 (BP Location: Left Arm, Patient Position: Sitting, Cuff Size: Large)   Ht 5\' 11"  (1.803 m)   Wt 227 lb (103 kg)   BMI 31.66 kg/m  Physical Exam Gen: NAD, alert, cooperative with exam, well-appearing MSK:  Right forearm: No swelling or ecchymosis. He does have deformity over the mid ulnar aspect. Well-healed vertical incisions. Neurovascular intact     ASSESSMENT & PLAN:   Closed fracture of right distal radius and ulna Initial injury on 2/7 and care was on 2/15.  He reports having nausea and vomiting here recently.  He has concerned that he has toxicity related to implant on his right forearm. -Counseled on home  exercise therapy and supportive care. -Sed rate, CRP, CMP, CBC activity level. -Could consider a nuclear bone scan of the area

## 2021-03-21 NOTE — Assessment & Plan Note (Signed)
Initial injury on 2/7 and care was on 2/15.  He reports having nausea and vomiting here recently.  He has concerned that he has toxicity related to implant on his right forearm. -Counseled on home exercise therapy and supportive care. -Sed rate, CRP, CMP, CBC activity level. -Could consider a nuclear bone scan of the area

## 2021-03-23 ENCOUNTER — Emergency Department (HOSPITAL_COMMUNITY): Payer: Medicaid Other

## 2021-03-23 ENCOUNTER — Emergency Department (HOSPITAL_COMMUNITY)
Admission: EM | Admit: 2021-03-23 | Discharge: 2021-03-23 | Disposition: A | Discharge status: Home / Self Care | Payer: Medicaid Other | Attending: Emergency Medicine | Admitting: Emergency Medicine

## 2021-03-23 ENCOUNTER — Encounter (HOSPITAL_COMMUNITY): Payer: Self-pay | Admitting: Emergency Medicine

## 2021-03-23 ENCOUNTER — Telehealth: Payer: Self-pay | Admitting: *Deleted

## 2021-03-23 ENCOUNTER — Telehealth: Payer: Self-pay | Admitting: Orthopedic Surgery

## 2021-03-23 ENCOUNTER — Other Ambulatory Visit: Payer: Self-pay

## 2021-03-23 DIAGNOSIS — J45909 Unspecified asthma, uncomplicated: Secondary | ICD-10-CM | POA: Diagnosis not present

## 2021-03-23 DIAGNOSIS — M79631 Pain in right forearm: Secondary | ICD-10-CM

## 2021-03-23 DIAGNOSIS — M7989 Other specified soft tissue disorders: Secondary | ICD-10-CM | POA: Diagnosis not present

## 2021-03-23 DIAGNOSIS — Z7901 Long term (current) use of anticoagulants: Secondary | ICD-10-CM | POA: Diagnosis not present

## 2021-03-23 DIAGNOSIS — I10 Essential (primary) hypertension: Secondary | ICD-10-CM | POA: Diagnosis not present

## 2021-03-23 DIAGNOSIS — R112 Nausea with vomiting, unspecified: Secondary | ICD-10-CM | POA: Diagnosis not present

## 2021-03-23 DIAGNOSIS — F17210 Nicotine dependence, cigarettes, uncomplicated: Secondary | ICD-10-CM | POA: Insufficient documentation

## 2021-03-23 DIAGNOSIS — Z79899 Other long term (current) drug therapy: Secondary | ICD-10-CM | POA: Diagnosis not present

## 2021-03-23 MED ORDER — PREDNISONE 10 MG PO TABS: ORAL_TABLET | ORAL | 0 refills | Status: DC

## 2021-03-23 NOTE — Telephone Encounter (Signed)
Angie I have left message for patient to call back Angie states patient has made threats when he called office. I have asked her to document them in this note so I can make Dr Aline Brochure and management aware.

## 2021-03-23 NOTE — Telephone Encounter (Signed)
Fine to come in plates arent titanium!  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

## 2021-03-23 NOTE — Telephone Encounter (Signed)
Patient called for 03/21/21 lab results. I informed him Dr. Raeford Razor is still waiting for all of his results.   He c/o right arm swelling, pain and extreme itching. He states the surgeon told him it "looks good". Patient states if something doesn't change soon he is going to the ER. Please advise.

## 2021-03-23 NOTE — Telephone Encounter (Signed)
Angie, please make appointment for him to come in next available no need to work in.  . I will call and provide reassurance. It is very rare to have a metal allergy that would cause this kind of trouble.

## 2021-03-23 NOTE — Telephone Encounter (Signed)
Patient called and stated that he thought the metal that Dr. Aline Brochure put in his arm was making his arm swell and itch.  I told him that Dr. Aline Brochure was not in the office today but would return Monday.  I did tell him that I would get a message to Dr. Aline Brochure.  I asked him how long this had been going on.  He said for 2 days.  He went on to say that he saw his back doctor, Dr. Phillis Knack in [facility] and the doctor ordered lab work for him.  He said they were waiting on the test for allergy for titanium.  I told him that he should call them to see if the results had come in yet.  He said he would do this and he might also go to the ED.  I did suggest that he speak to his PCP and he said they are closed today.  He then went on to say that he thought he has missed his psychiatry appointment but it didn't matter because that doctor was doing anything for him anyway.  I told him that I would send the message to Dr. Aline Brochure for him.  He said okay to this.

## 2021-03-23 NOTE — ED Provider Notes (Signed)
[facility] Provider Note   CSN: [id number] Arrival date & time: 03/23/21  0938     History No chief complaint on file.   Lance Cohen is a 50 y.o. male.  HPI      Lance Cohen is a 50 y.o. male who presents to the Emergency Department complaining of pain and swelling of his right forearm x1 week.  He had surgical repair with ORIF for a radial fracture in January of this year.  He has been followed by his orthopedic provider and his family practice provider.  He comes to the emergency department today with complaints of pain and swelling of the forearm associated with nausea and vomiting.  He is concerned that he has toxicity secondary to the titanium implant.  He states that he had a similar issue years ago with a filter that was placed after a blood clot and he subsequently had to have the filter removed. denies fever, chills, numbness of the extremity    Past Medical History:  Diagnosis Date  . Anxiety   . Arthritis   . Asthma   . Bipolar 1 disorder ([place])   . Chronic knee pain   . Depression   . DVT (deep venous thrombosis) ([place])   . Hypertension   . Insomnia   . Sleep apnea   . Spinal stenosis   . Suicide attempt [facility])    2010 Intentional overdose attempt with Depakote.    Patient Active Problem List   Diagnosis Date Noted  . Closed fracture of right distal radius and ulna 12/21/2020  . Closed fracture of right forearm   . Daily headache 11/27/2020  . Dizzy spells 11/27/2020  . Seizure ([place]) 11/27/2020  . Myofascial pain dysfunction syndrome 11/22/2020  . Cocaine abuse ([place]) 11/18/2020  . Foreign body (FB) in soft tissue 09/08/2020  . Chronic low back pain 05/11/2020  . Shoulder pain 05/11/2020  . Headache 05/11/2020  . Cervical radiculopathy 02/18/2020  . Impingement syndrome of right shoulder 01/18/2020  . Rectal pain 06/29/2019  . GERD (gastroesophageal reflux disease) 06/29/2019  . Rectal bleeding 02/22/2019  . Priapism  02/08/2019  . Chest pain 10/19/2017  . Asthma 10/19/2017  . Hypertension 10/19/2017  . Arthralgia of right knee 10/19/2017  . Pain in limb 12/27/2016  . Acute pulmonary embolism ([place]) 10/23/2016  . Nausea and vomiting 10/23/2016  . S/P IVC filter 10/23/2016  . Tobacco abuse counseling 10/23/2016  . Chronic deep vein thrombosis (DVT) ([place]) 10/18/2016  . Cellulitis of foot 06/24/2011  . Bipolar 1 disorder ([place]) 06/24/2011  . Tobacco abuse 06/24/2011  . Polycythemia 06/24/2011  . Tinea pedis 06/24/2011  . CARPAL TUNNEL SYNDROME, RIGHT 06/21/2010  . CONTUSION, LEFT FOOT 06/05/2010  . FOREIGN BODY, FOOT 04/17/2010    Past Surgical History:  Procedure Laterality Date  . COLONOSCOPY WITH PROPOFOL N/A 03/18/2019   Procedure: COLONOSCOPY WITH PROPOFOL;  Surgeon: [name], MD;  Location: [facility];  Service: Endoscopy;  Laterality: N/A;  2:30pm  . FOOT SURGERY     had peice of wire removed  . IVC FILTER REMOVAL N/A 02/24/2017   Procedure: IVC Filter Removal;  Surgeon: [name], MD;  Location: [facility];  Service: Cardiovascular;  Laterality: N/A;  . ORIF RADIAL FRACTURE Right 12/05/2020   Procedure: OPEN REDUCTION INTERNAL FIXATION (ORIF) RADIAL FRACTURE and ULNA FRACTURE;  Surgeon: Carole Civil, MD;  Location: [facility];  Service: Orthopedics;  Laterality: Right;  . PERIPHERAL VASCULAR  CATHETERIZATION Right 10/22/2016   Procedure: Thrombectomy, thrombolyisis;  Surgeon: [name], MD;  Location: [facility];  Service: Cardiovascular;  Laterality: Right;  . PERIPHERAL VASCULAR CATHETERIZATION N/A 10/22/2016   Procedure: IVC Filter Insertion;  Surgeon: [name], MD;  Location: [facility];  Service: Cardiovascular;  Laterality: N/A;  . POLYPECTOMY  03/18/2019   Procedure: POLYPECTOMY;  Surgeon: [name], MD;  Location: [facility];  Service: Endoscopy;;       Family History  Problem Relation Age of Onset  . Cancer Mother    . Heart attack Father   . Colon cancer Neg Hx     Social History   Tobacco Use  . Smoking status: Current Every Day Smoker    Packs/day: 0.50    Types: Cigarettes  . Smokeless tobacco: Never Used  Vaping Use  . Vaping Use: Never used  Substance Use Topics  . Alcohol use: Yes  . Drug use: Yes    Types: Marijuana    Comment: smokes for pain    Home Medications Prior to Admission medications   Medication Sig Start Date End Date Taking? Authorizing Provider  albuterol (VENTOLIN HFA) 108 (90 Base) MCG/ACT inhaler Inhale 1-2 puffs into the lungs every 6 (six) hours as needed for wheezing or shortness of breath. 01/17/21   Dorie Rank, MD  allopurinol (ZYLOPRIM) 100 MG tablet Take 1 tablet (100 mg total) by mouth daily. 09/12/20   Rosemarie Ax, MD  doxepin (SINEQUAN) 10 MG capsule TAKE (1) CAPSULE BY MOUTHCONCE DAILY. 12/07/20   [provider]  gabapentin (NEURONTIN) 300 MG capsule Take 1 capsule (300 mg total) by mouth 3 (three) times daily as needed. 02/18/20   Rosemarie Ax, MD  hydrOXYzine (ATARAX/VISTARIL) 50 MG tablet Take 50 mg by mouth 3 (three) times daily as needed for anxiety or itching. 12/07/20   [provider]  ondansetron (ZOFRAN ODT) 8 MG disintegrating tablet Take 1 tablet (8 mg total) by mouth every 8 (eight) hours as needed for nausea or vomiting. 01/17/21   Dorie Rank, MD  predniSONE (DELTASONE) 50 MG tablet Take 1 tablet (50 mg total) by mouth daily. 01/17/21   Dorie Rank, MD  pregabalin (LYRICA) 200 MG capsule Take 200 mg by mouth in the morning, at noon, and at bedtime.    [provider]  rivaroxaban (XARELTO) 20 MG TABS tablet Take 20 mg by mouth daily with breakfast.    [provider]  SUBOXONE 4-1 MG FILM Place 1 strip under the tongue 2 (two) times daily. 12/12/20   [provider]  traMADol (ULTRAM) 50 MG tablet Take 100 mg by mouth every 8 (eight) hours as needed (PAIN). 07/19/20   [provider]     Allergies    Bee venom, Penicillins, and Sulfa antibiotics  Review of Systems   Review of Systems  Constitutional: Negative for chills, fatigue and fever.  Respiratory: Negative for shortness of breath.   Cardiovascular: Negative for chest pain.  Gastrointestinal: Positive for nausea and vomiting.  Genitourinary: Negative for dysuria, flank pain and hematuria.  Musculoskeletal: Positive for arthralgias (right forearm pain and swelling). Negative for neck pain and neck stiffness.  Skin: Negative for rash.  Neurological: Negative for dizziness, weakness, numbness and headaches.  Hematological: Does not bruise/bleed easily.    Physical Exam Updated Vital Signs BP 140/86   Pulse 63   Temp 98.3 F (36.8 C) (Oral)   Resp 18   Ht 5'  11" (1.803 m)   Wt 103 kg   SpO2 100%   BMI 31.66 kg/m   Physical Exam Vitals and nursing note reviewed.  Constitutional:      General: He is not in acute distress.    Appearance: Normal appearance.  HENT:     Mouth/Throat:     Mouth: Mucous membranes are moist.  Cardiovascular:     Rate and Rhythm: Normal rate and regular rhythm.  Abdominal:     General: There is no distension.     Palpations: Abdomen is soft.     Tenderness: There is no abdominal tenderness. There is no guarding.  Musculoskeletal:        General: No signs of injury.     Comments: Well healed surgical scar to the right forearm, no excessive warmth or surrounding erythema.  No appreciable edema noted on exam.  Compartments are soft.    Skin:    General: Skin is warm.     Capillary Refill: Capillary refill takes less than 2 seconds.     Findings: No bruising, erythema or rash.  Neurological:     Mental Status: He is alert.     ED Results / Procedures / Treatments   Labs (all labs ordered are listed, but only abnormal results are displayed) Labs Reviewed - No data to display  EKG None  Radiology DG Forearm Right  Result Date: 03/23/2021 CLINICAL DATA:   50 year old male with history of forearm fracture. EXAM: RIGHT FOREARM - 2 VIEW COMPARISON:  Mar 05, 2021. FINDINGS: Signs of calcification at the expected location of intraosseous membrane. No bony resorption along cortical plate and screw fixation of ulnar and radial fractures at the junction of middle and distal of the radius and ulna. No significant overlying soft tissue swelling. No sign of new fracture. IMPRESSION: Callus formation and heterotopic ossification about forearm fractures post cortical plate and screw fixation. No bony resorptive changes or acute findings. Electronically Signed   By: Zetta Bills M.D.   On: 03/23/2021 10:53   DG Forearm Right  Result Date: 03/05/2021 [facility] Radiology Report Dictated by Dr. Aline Brochure Chief complaint  12/05/20 orif BBFA Images: ap and lateral right forearm Both fractures are anatomically aligned with intact hardware plates on each one There is a fair amount of heterotopic bone between the radius and ulna but no evidence of bridging   HEALED FRACTURES OF BBFA, with moderate amount of hetero topic bone without bridging   US Venous Img Upper Uni Right(DVT)  Result Date: 03/23/2021 CLINICAL DATA:  Right arm swelling EXAM: RIGHT UPPER EXTREMITY VENOUS DOPPLER ULTRASOUND TECHNIQUE: Gray-scale sonography with graded compression, as well as color Doppler and duplex ultrasound were performed to evaluate the upper extremity deep venous system from the level of the subclavian vein and including the jugular, axillary, basilic, radial, ulnar and upper cephalic vein. Spectral Doppler was utilized to evaluate flow at rest and with distal augmentation maneuvers. COMPARISON:  None. FINDINGS: Contralateral Subclavian Vein: Respiratory phasicity is normal and symmetric with the symptomatic side. No evidence of thrombus. Normal compressibility. Internal Jugular Vein: No evidence of thrombus. Normal compressibility, respiratory phasicity and response to  augmentation. Subclavian Vein: No evidence of thrombus. Normal compressibility, respiratory phasicity and response to augmentation. Axillary Vein: No evidence of thrombus. Normal compressibility, respiratory phasicity and response to augmentation. Cephalic Vein: No evidence of thrombus. Normal compressibility, respiratory phasicity and response to augmentation. Basilic Vein: No evidence of thrombus. Normal compressibility, respiratory phasicity and response to augmentation. Brachial  Veins: No evidence of thrombus. Normal compressibility, respiratory phasicity and response to augmentation. Radial Veins: No evidence of thrombus. Normal compressibility, respiratory phasicity and response to augmentation. Ulnar Veins: No evidence of thrombus. Normal compressibility, respiratory phasicity and response to augmentation. Venous Reflux:  None visualized. Other Findings:  None visualized. IMPRESSION: No evidence of DVT within the right upper extremity. Electronically Signed   By: Jacqulynn Cadet M.D.   On: 03/23/2021 11:58     Procedures Procedures   Medications Ordered in ED Medications - No data to display  ED Course  I have reviewed the triage vital signs and the nursing notes.  Pertinent labs & imaging results that were available during my care of the patient were reviewed by me and considered in my medical decision making (see chart for details).    MDM Rules/Calculators/A&P                           Pt here for evaluation of reported pain and swelling to the right forearm.  Hx of ORIF in February secondary to radius and ulna fx.     No clinical evidence to suggest infection.  NV intact.  XR of the extremity w/o acute finding and [place] w/o evidence of DVT.  Current sx's possibly radicular.  Doubt emergent process   Consulted Dr. Aline Brochure and discussed findings.  Will start patient on steroid taper and he will follow-up closely in Dr. Ruthe Mannan office.  Pt agreeable to plan.     Final Clinical  Impression(s) / ED Diagnoses Final diagnoses:  Arm swelling  Right forearm pain    Rx / DC Orders ED Discharge Orders    None       Kem Parkinson, PA-C 03/26/21 1353    Long, Wonda Old[facility], MD 03/26/21 [phone]

## 2021-03-23 NOTE — ED Triage Notes (Signed)
Pt reports swelling to right arm starting about 1 week ago. Minimal swelling and redness to forearm area. Warm to touch, similar to unaffected arm.

## 2021-03-23 NOTE — Telephone Encounter (Signed)
I left message for patient to call me back.

## 2021-03-23 NOTE — Telephone Encounter (Signed)
I called the patient he says he is in the ER he said he started having swelling 2 or 3 days ago.  I reviewed with him that his arm was not swollen on 16 May  He said he had a bump near one of the incisions and now the whole arm is red and hurting.  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

## 2021-03-23 NOTE — Discharge Instructions (Addendum)
Your ultrasound and x-ray of your forearm today are reassuring.  Try the prescription prednisone and take as directed until it is finished.  Call Dr. Ruthe Mannan office to arrange a follow-up appointment.

## 2021-03-26 ENCOUNTER — Ambulatory Visit (INDEPENDENT_AMBULATORY_CARE_PROVIDER_SITE_OTHER): Payer: Medicaid Other | Admitting: Vascular Surgery

## 2021-03-26 NOTE — Telephone Encounter (Signed)
I have advised Dr Aline Brochure patient stated he would shoot [place] if he had a gun. Dr Aline Brochure is aware.He stated this threat to Angie I asked Angie to document but she has not. Our manager is out of the office currently.

## 2021-03-27 ENCOUNTER — Telehealth: Payer: Self-pay | Admitting: Orthopedic Surgery

## 2021-03-27 LAB — COMPREHENSIVE METABOLIC PANEL
ALT: 12 IU/L (ref 0–44)
AST: 21 IU/L (ref 0–40)
Albumin/Globulin Ratio: 1.7 (ref 1.2–2.2)
Albumin: 4.4 g/dL (ref 4.0–5.0)
Alkaline Phosphatase: 100 IU/L (ref 44–121)
BUN/Creatinine Ratio: 7 — ABNORMAL LOW (ref 9–20)
BUN: 8 mg/dL (ref 6–24)
Bilirubin Total: 0.6 mg/dL (ref 0.0–1.2)
CO2: 21 mmol/L (ref 20–29)
Calcium: 9 mg/dL (ref 8.7–10.2)
Chloride: 100 mmol/L (ref 96–106)
Creatinine, Ser: 1.1 mg/dL (ref 0.76–1.27)
Globulin, Total: 2.6 g/dL (ref 1.5–4.5)
Glucose: 80 mg/dL (ref 65–99)
Potassium: 4.5 mmol/L (ref 3.5–5.2)
Sodium: 138 mmol/L (ref 134–144)
Total Protein: 7 g/dL (ref 6.0–8.5)
eGFR: 82 mL/min/{1.73_m2} (ref >59)

## 2021-03-27 LAB — CBC WITH DIFFERENTIAL
Basophils Absolute: 0.1 10*3/uL (ref 0.0–0.2)
Basos: 1 %
EOS (ABSOLUTE): 0.2 10*3/uL (ref 0.0–0.4)
Eos: 2 %
Hematocrit: 46.5 % (ref 37.5–51.0)
Hemoglobin: 15.7 g/dL (ref 13.0–17.7)
Immature Grans (Abs): 0 10*3/uL (ref 0.0–0.1)
Immature Granulocytes: 0 %
Lymphocytes Absolute: 3.1 10*3/uL (ref 0.7–3.1)
Lymphs: 34 %
MCH: 32.6 pg (ref 26.6–33.0)
MCHC: 33.8 g/dL (ref 31.5–35.7)
MCV: 97 fL (ref 79–97)
Monocytes Absolute: 0.9 10*3/uL (ref 0.1–0.9)
Monocytes: 10 %
Neutrophils Absolute: 4.8 10*3/uL (ref 1.4–7.0)
Neutrophils: 53 %
RBC: 4.81 x10E6/uL (ref 4.14–5.80)
RDW: 12.3 % (ref 11.6–15.4)
WBC: 9 10*3/uL (ref 3.4–10.8)

## 2021-03-27 LAB — C-REACTIVE PROTEIN: CRP: 9 mg/L (ref 0–10)

## 2021-03-27 LAB — SEDIMENTATION RATE: Sed Rate: 12 mm/hr (ref 0–15)

## 2021-03-27 LAB — TITANIUM, WB: Titanium, WB: <10 ng/mL (ref <10.0)

## 2021-03-27 NOTE — Telephone Encounter (Signed)
I think there has been a miscommunication regarding this patient's comment.  When I spoke with Lance Cohen on Friday, 03/23/21. he was frustrated as to what is going on with his arm and having so much pain.  I gave him an appointment to come in to see Dr. Aline Brochure per clinic staff.  At the end of our conversation he did say that "I wish I had a gun" but in no way did I take this as a threat to anyone in our office.  He then stated he surely appreciated everyone's help here and appreciated me giving him an appointment.

## 2021-03-27 NOTE — Telephone Encounter (Signed)
Left VM for patient. If he calls back please have him speak with a nurse/CMA and inform that his lab work is normal. We may need to consider a bone scan or CT scan if he is still having pain. .   If any questions then please take the best time and phone number to call and I will try to call him  back.   Rosemarie Ax, MD [facility] 03/27/2021, 8:04 AM

## 2021-03-29 ENCOUNTER — Ambulatory Visit (INDEPENDENT_AMBULATORY_CARE_PROVIDER_SITE_OTHER): Payer: Medicaid Other | Admitting: Orthopedic Surgery

## 2021-03-29 ENCOUNTER — Other Ambulatory Visit: Payer: Self-pay

## 2021-03-29 ENCOUNTER — Encounter: Payer: Self-pay | Admitting: Orthopedic Surgery

## 2021-03-29 VITALS — BP 122/76 | HR 80 | Ht 71.0 in | Wt 225.0 lb

## 2021-03-29 DIAGNOSIS — M4302 Spondylolysis, cervical region: Secondary | ICD-10-CM

## 2021-03-29 DIAGNOSIS — Z8781 Personal history of (healed) traumatic fracture: Secondary | ICD-10-CM

## 2021-03-29 DIAGNOSIS — M1A0310 Idiopathic chronic gout, right wrist, without tophus (tophi): Secondary | ICD-10-CM

## 2021-03-29 DIAGNOSIS — S52501D Unspecified fracture of the lower end of right radius, subsequent encounter for closed fracture with routine healing: Secondary | ICD-10-CM

## 2021-03-29 NOTE — Progress Notes (Signed)
FOLLOW UP   Chief Complaint  Patient presents with   Post-op Follow-up    Right forearm fracture 12/05/20 states painful    Mr. Lance Cohen is status post ORIF both bones forearm fracture back in February 2022.  Went to the ER last week for arm pain was put on steroids and seems to be improved.  He says he thinks he has gout.  He actually complains of wrist pain we x-rayed him last time and he has arthritis in his wrist  His forearm fracture has healed he was worked up for infection this was normal  He also has a cervical radiculopathy history of previous epidural steroids and has a fair amount of cervical disc disease and spinal stenosis  However at present his pain is controlled with the gout medication  He is off the Suboxone Lyrica and Neurontin  His arm looks good today no signs of infection his elbow range of motion and wrist range of motion are normal except for pain with wrist extension  The surgical wounds of healed without incident  Follow-up in a month continue colchicine he also takes allopurinol   IMPRESSION: Technically successful initial epidural injection on the right at C7-T1.   Patient was having a severe headache prior to and following the procedure.     Electronically Signed   By: Nelson Chimes M.D.   On: 04/27/2020 10:27    C3-4: Mild disc degeneration and diffuse uncinate spurring causing moderate right foraminal encroachment and mild left foraminal encroachment. Mild spinal stenosis.   C4-5: Small central disc protrusion. Disc degeneration and diffuse uncinate spurring. Mild facet degeneration. Moderate right foraminal encroachment and mild left foraminal encroachment. Mild spinal stenosis.   C5-6: Small central disc protrusion. Mild foraminal narrowing bilaterally due to spurring and facet hypertrophy.   C6-7: Mild disc degeneration and spurring. No significant spinal or foraminal stenosis.   C7-T1: Mild facet degeneration. Mild foraminal  narrowing bilaterally.   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   IMPRESSION: Multilevel degenerative change in the cervical spine. Congenital spinal stenosis. Acquired spinal and foraminal stenosis at multiple levels as described above.     Electronically Signed   By: Franchot Gallo M.D.   On: 04/12/2020 08:21  Encounter Diagnoses  Name Primary?   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

## 2021-04-03 ENCOUNTER — Telehealth: Payer: Self-pay | Admitting: Orthopedic Surgery

## 2021-04-03 NOTE — Telephone Encounter (Signed)
Patient called and wanted [place] to ask Dr Aline Brochure if Indomethacin that he takes for gout, can give you stomach cramps?  I told him that Dr. Aline Brochure was not in the office today and that he could check with his pharmacist.  He said he didn't want to do this.  He said Dr. Aline Brochure is very smart and he trusts him when it comes to knowing medications.  Please advise  Thanks

## 2021-04-03 NOTE — Telephone Encounter (Signed)
Yes Indocin can cause stomach upset. He also may want to discuss with primary care, since it may not be ok to use with is Xarelto. I called him to discuss. He voiced understanding and will reach out to his primary care.

## 2021-04-05 ENCOUNTER — Telehealth: Payer: Self-pay | Admitting: Orthopedic Surgery

## 2021-04-05 ENCOUNTER — Other Ambulatory Visit: Payer: Self-pay | Admitting: Orthopedic Surgery

## 2021-04-05 MED ORDER — COLCHICINE 0.6 MG PO TABS: 0.6000 mg | ORAL_TABLET | Freq: Two times a day (BID) | ORAL | 5 refills | Status: DC

## 2021-04-05 NOTE — Progress Notes (Signed)
Meds ordered this encounter  Medications   colchicine 0.6 MG tablet    Sig: Take 1 tablet (0.6 mg total) by mouth 2 (two) times daily.    Dispense:  60 tablet    Refill:  5

## 2021-04-05 NOTE — Telephone Encounter (Signed)
Mr. Lance Cohen called in this morning asking if Dr Aline Brochure would give him something for gout  He states he uses [employer]

## 2021-04-07 ENCOUNTER — Other Ambulatory Visit: Payer: Self-pay

## 2021-04-07 ENCOUNTER — Emergency Department (HOSPITAL_COMMUNITY)
Admission: EM | Admit: 2021-04-07 | Discharge: 2021-04-07 | Disposition: A | Discharge status: Home / Self Care | Payer: Medicaid Other | Attending: Emergency Medicine | Admitting: Emergency Medicine

## 2021-04-07 ENCOUNTER — Encounter (HOSPITAL_COMMUNITY): Payer: Self-pay | Admitting: *Deleted

## 2021-04-07 DIAGNOSIS — E860 Dehydration: Secondary | ICD-10-CM | POA: Diagnosis not present

## 2021-04-07 DIAGNOSIS — Z79899 Other long term (current) drug therapy: Secondary | ICD-10-CM | POA: Insufficient documentation

## 2021-04-07 DIAGNOSIS — Z7901 Long term (current) use of anticoagulants: Secondary | ICD-10-CM | POA: Diagnosis not present

## 2021-04-07 DIAGNOSIS — M25512 Pain in left shoulder: Secondary | ICD-10-CM | POA: Insufficient documentation

## 2021-04-07 DIAGNOSIS — M542 Cervicalgia: Secondary | ICD-10-CM | POA: Insufficient documentation

## 2021-04-07 DIAGNOSIS — R531 Weakness: Secondary | ICD-10-CM | POA: Insufficient documentation

## 2021-04-07 DIAGNOSIS — I10 Essential (primary) hypertension: Secondary | ICD-10-CM | POA: Insufficient documentation

## 2021-04-07 DIAGNOSIS — M25511 Pain in right shoulder: Secondary | ICD-10-CM | POA: Insufficient documentation

## 2021-04-07 DIAGNOSIS — F17210 Nicotine dependence, cigarettes, uncomplicated: Secondary | ICD-10-CM | POA: Diagnosis not present

## 2021-04-07 DIAGNOSIS — J45909 Unspecified asthma, uncomplicated: Secondary | ICD-10-CM | POA: Insufficient documentation

## 2021-04-07 DIAGNOSIS — R55 Syncope and collapse: Secondary | ICD-10-CM

## 2021-04-07 LAB — COMPREHENSIVE METABOLIC PANEL
ALT: 22 U/L (ref 0–44)
AST: 24 U/L (ref 15–41)
Albumin: 4.2 g/dL (ref 3.5–5.0)
Alkaline Phosphatase: 82 U/L (ref 38–126)
Anion gap: 5 (ref 5–15)
BUN: 16 mg/dL (ref 6–20)
CO2: 25 mmol/L (ref 22–32)
Calcium: 8.8 mg/dL — ABNORMAL LOW (ref 8.9–10.3)
Chloride: 105 mmol/L (ref 98–111)
Creatinine, Ser: 1.2 mg/dL (ref 0.61–1.24)
GFR, Estimated: >60 mL/min (ref >60)
Glucose, Bld: 91 mg/dL (ref 70–99)
Potassium: 4.7 mmol/L (ref 3.5–5.1)
Sodium: 135 mmol/L (ref 135–145)
Total Bilirubin: 0.6 mg/dL (ref 0.3–1.2)
Total Protein: 7.4 g/dL (ref 6.5–8.1)

## 2021-04-07 LAB — CBC WITH DIFFERENTIAL/PLATELET
Abs Immature Granulocytes: 0.02 10*3/uL (ref 0.00–0.07)
Basophils Absolute: 0.1 10*3/uL (ref 0.0–0.1)
Basophils Relative: 1 %
Eosinophils Absolute: 0.1 10*3/uL (ref 0.0–0.5)
Eosinophils Relative: 1 %
HCT: 45.2 % (ref 39.0–52.0)
Hemoglobin: 15.8 g/dL (ref 13.0–17.0)
Immature Granulocytes: 0 %
Lymphocytes Relative: 33 %
Lymphs Abs: 2.8 10*3/uL (ref 0.7–4.0)
MCH: 33.1 pg (ref 26.0–34.0)
MCHC: 35 g/dL (ref 30.0–36.0)
MCV: 94.8 fL (ref 80.0–100.0)
Monocytes Absolute: 0.8 10*3/uL (ref 0.1–1.0)
Monocytes Relative: 9 %
Neutro Abs: 4.6 10*3/uL (ref 1.7–7.7)
Neutrophils Relative %: 56 %
Platelets: 323 10*3/uL (ref 150–400)
RBC: 4.77 MIL/uL (ref 4.22–5.81)
RDW: 12.4 % (ref 11.5–15.5)
WBC: 8.3 10*3/uL (ref 4.0–10.5)
nRBC: 0 % (ref 0.0–0.2)

## 2021-04-07 LAB — URINALYSIS, ROUTINE W REFLEX MICROSCOPIC
Bilirubin Urine: NEGATIVE
Glucose, UA: NEGATIVE mg/dL
Hgb urine dipstick: NEGATIVE
Ketones, ur: NEGATIVE mg/dL
Leukocytes,Ua: NEGATIVE
Nitrite: NEGATIVE
Protein, ur: NEGATIVE mg/dL
Specific Gravity, Urine: 1.012 (ref 1.005–1.030)
pH: 6 (ref 5.0–8.0)

## 2021-04-07 LAB — MAGNESIUM: Magnesium: 2.1 mg/dL (ref 1.7–2.4)

## 2021-04-07 MED ORDER — SODIUM CHLORIDE 0.9 % IV BOLUS
1000.0000 mL | Freq: Once | INTRAVENOUS | Status: AC
  Administered 2021-04-07: 1000 mL via INTRAVENOUS

## 2021-04-07 MED ORDER — ONDANSETRON 4 MG PO TBDP: 4.0000 mg | ORAL_TABLET | Freq: Three times a day (TID) | ORAL | 0 refills | Status: DC | PRN

## 2021-04-07 MED ORDER — KETOROLAC TROMETHAMINE 30 MG/ML IJ SOLN
30.0000 mg | Freq: Once | INTRAMUSCULAR | Status: AC
  Administered 2021-04-07: 30 mg via INTRAVENOUS
  Filled 2021-04-07: qty 1

## 2021-04-07 MED ORDER — ONDANSETRON HCL 4 MG/2ML IJ SOLN
4.0000 mg | INTRAMUSCULAR | Status: AC
  Administered 2021-04-07: 4 mg via INTRAVENOUS
  Filled 2021-04-07: qty 2

## 2021-04-07 NOTE — ED Triage Notes (Signed)
Pt c/o abdominal cramps, generalized muscle aches, generalized weakness, near syncope x 2 yesterday. Pt reports he recently started Colchicine for his gout and doesn't know if this is a reaction to his new medication.

## 2021-04-07 NOTE — Discharge Instructions (Addendum)
Your test has been normal, continue to drink plenty of fluids, Zofran as needed for nausea, see your doctor in 1 week if no better  Emergency department for worsening symptoms

## 2021-04-07 NOTE — ED Provider Notes (Signed)
[facility] EMERGENCY DEPARTMENT Provider Note   CSN: [id number] Arrival date & time: 04/07/21  1002     History Chief Complaint  Patient presents with   Near Syncope   Weakness    Lance Cohen is a 50 y.o. male.  This patient is a 50 year old male, he has multiple medical problems including chronic bipolar disorder, insomnia, asthma and hypertension.  He is currently on an albuterol inhaler amlodipine lisinopril quetiapine and is currently taking colchicine because of gout.  He presents to the hospital with multiple medical problems including complaints of pain in his neck and his bilateral shoulders which is chronic and similar to his chronic myofascial pain syndrome.  He also states that yesterday after being outside in the hot sun for a couple of hours he was extremely weak and was having difficulty walking and getting back into the house, he needed his wife to help him.  He was able to get up this morning and have some chicken noodle soup which she states is a staple of what he eats due to intolerance of other foods.  He did not vomit has no diarrhea, no complaints of rectal bleeding, no blurred vision, mild generalized weakness.   Near Syncope  Weakness Associated symptoms: near-syncope       Past Medical History:  Diagnosis Date   Anxiety    Arthritis    Asthma    Bipolar 1 disorder (HCC)    Chronic knee pain    Depression    DVT (deep venous thrombosis) ([place])    Hypertension    Insomnia    Sleep apnea    Spinal stenosis    Suicide attempt ([place])    2010 Intentional overdose attempt with Depakote.    Patient Active Problem List   Diagnosis Date Noted   Closed fracture of right distal radius and ulna 12/21/2020   Closed fracture of right forearm    Daily headache 11/27/2020   Dizzy spells 11/27/2020   Seizure ([place]) 11/27/2020   Myofascial pain dysfunction syndrome 11/22/2020   Cocaine abuse ([place]) 11/18/2020   Foreign body (FB) in soft tissue 09/08/2020    Chronic low back pain 05/11/2020   Shoulder pain 05/11/2020   Headache 05/11/2020   Cervical radiculopathy 02/18/2020   Impingement syndrome of right shoulder 01/18/2020   Rectal pain 06/29/2019   GERD (gastroesophageal reflux disease) 06/29/2019   Rectal bleeding 02/22/2019   Priapism 02/08/2019   Chest pain 10/19/2017   Asthma 10/19/2017   Hypertension 10/19/2017   Arthralgia of right knee 10/19/2017   Pain in limb 12/27/2016   Acute pulmonary embolism ([place]) 10/23/2016   Nausea and vomiting 10/23/2016   S/P IVC filter 10/23/2016   Tobacco abuse counseling 10/23/2016   Chronic deep vein thrombosis (DVT) ([place]) 10/18/2016   Cellulitis of foot 06/24/2011   Bipolar 1 disorder ([place]) 06/24/2011   Tobacco abuse 06/24/2011   Polycythemia 06/24/2011   Tinea pedis 06/24/2011   CARPAL TUNNEL SYNDROME, RIGHT 06/21/2010   CONTUSION, LEFT FOOT 06/05/2010   FOREIGN BODY, FOOT 04/17/2010    Past Surgical History:  Procedure Laterality Date   COLONOSCOPY WITH PROPOFOL N/A 03/18/2019   Procedure: COLONOSCOPY WITH PROPOFOL;  Surgeon: [name], MD;  [facility];  Service: Endoscopy;  Laterality: N/A;  2:30pm   FOOT SURGERY     had peice of wire removed   IVC FILTER REMOVAL N/A 02/24/2017   Procedure: IVC Filter Removal;  Surgeon: [name], MD;  Location: [place]  CV LAB;  Service: Cardiovascular;  Laterality: N/A;   ORIF RADIAL FRACTURE Right 12/05/2020   Procedure: OPEN REDUCTION INTERNAL FIXATION (ORIF) RADIAL FRACTURE and ULNA FRACTURE;  Surgeon: Carole Civil, MD;  Location: [facility] OR[facility];  Service: Orthopedics;  Laterality: Right;   PERIPHERAL VASCULAR CATHETERIZATION Right 10/22/2016   Procedure: Thrombectomy, thrombolyisis;  Surgeon: [name], MD;  Location: [place] CV LAB;  Service: Cardiovascular;  Laterality: Right;   PERIPHERAL VASCULAR CATHETERIZATION N/A 10/22/2016   Procedure: IVC Filter Insertion;  Surgeon: [name], MD;  Location:  [place] CV LAB;  Service: Cardiovascular;  Laterality: N/A;   POLYPECTOMY  03/18/2019   Procedure: POLYPECTOMY;  Surgeon: [name], MD;  [facility];  Service: Endoscopy;;       Family History  Problem Relation Age of Onset   Cancer Mother    Heart attack Father    Colon cancer Neg Hx     Social History   Tobacco Use   Smoking status: Every Day    Packs/day: 0.50    Pack years: 0.00    Types: Cigarettes   Smokeless tobacco: Never  Vaping Use   Vaping Use: Never used  Substance Use Topics   Alcohol use: Yes    Comment: seldom   Drug use: Not Currently    Types: Marijuana    Comment: smokes for pain    Home Medications Prior to Admission medications   Medication Sig Start Date End Date Taking? Authorizing Provider  ondansetron (ZOFRAN ODT) 4 MG disintegrating tablet Take 1 tablet (4 mg total) by mouth every 8 (eight) hours as needed for nausea. 04/07/21  Yes [name], MD  albuterol (VENTOLIN HFA) 108 (90 Base) MCG/ACT inhaler Inhale 1-2 puffs into the lungs every 6 (six) hours as needed for wheezing or shortness of breath. 01/17/21   Dorie Rank, MD  allopurinol (ZYLOPRIM) 100 MG tablet Take 1 tablet (100 mg total) by mouth daily. 09/12/20   Rosemarie Ax, MD  colchicine 0.6 MG tablet Take 1 tablet (0.6 mg total) by mouth 2 (two) times daily. 04/05/21   Carole Civil, MD  doxepin (SINEQUAN) 10 MG capsule TAKE (1) CAPSULE BY MOUTHCONCE DAILY. Patient not taking: Reported on 03/29/2021 12/07/20   [provider]  rivaroxaban (XARELTO) 20 MG TABS tablet Take 20 mg by mouth daily with breakfast.    [provider]    Allergies    Bee venom, Penicillins, and Sulfa antibiotics  Review of Systems   Review of Systems  Cardiovascular:  Positive for near-syncope.  Neurological:  Positive for weakness.  All other systems reviewed and are negative.  Physical Exam Updated Vital Signs BP 121/75   Pulse 64   Temp 97.9 F (36.6  C) (Oral)   Resp 16   Ht 1.803 m (5\' 11" )   Wt 103 kg   SpO2 99%   BMI 31.66 kg/m   Physical Exam Vitals and nursing note reviewed.  Constitutional:      General: He is not in acute distress.    Appearance: He is well-developed.  HENT:     Head: Normocephalic and atraumatic.     Mouth/Throat:     Pharynx: No oropharyngeal exudate.  Eyes:     General: No scleral icterus.       Right eye: No discharge.        Left eye: No discharge.     Conjunctiva/sclera: Conjunctivae normal.     Pupils: Pupils are equal, round,  and reactive to light.  Neck:     Thyroid: No thyromegaly.     Vascular: No JVD.  Cardiovascular:     Rate and Rhythm: Normal rate and regular rhythm.     Heart sounds: Normal heart sounds. No murmur heard.   No friction rub. No gallop.  Pulmonary:     Effort: Pulmonary effort is normal. No respiratory distress.     Breath sounds: Normal breath sounds. No wheezing or rales.  Abdominal:     General: Bowel sounds are normal. There is no distension.     Palpations: Abdomen is soft. There is no mass.     Tenderness: There is no abdominal tenderness.  Musculoskeletal:        General: No tenderness. Normal range of motion.     Cervical back: Normal range of motion and neck supple.  Lymphadenopathy:     Cervical: No cervical adenopathy.  Skin:    General: Skin is warm and dry.     Findings: No erythema or rash.  Neurological:     Mental Status: He is alert.     Coordination: Coordination normal.     Comments: The patient is able to stand walk and maneuver in the bed without difficulty, he has normal strength in all 4 extremities, no facial droop.  Speech is clear  Psychiatric:        Behavior: Behavior normal.    ED Results / Procedures / Treatments   Labs (all labs ordered are listed, but only abnormal results are displayed) Labs Reviewed  COMPREHENSIVE METABOLIC PANEL - Abnormal; Notable for the following components:      Result Value   Calcium 8.8 (*)     All other components within normal limits  CBC WITH DIFFERENTIAL/PLATELET  MAGNESIUM  URINALYSIS, ROUTINE W REFLEX MICROSCOPIC    EKG EKG Interpretation  Date/Time:  Saturday April 07 2021 10:40:22 EDT Ventricular Rate:  76 PR Interval:  148 QRS Duration: 89 QT Interval:  383 QTC Calculation: 431 R Axis:   -48 Text Interpretation: Sinus rhythm Left anterior fascicular block Low voltage, precordial leads Confirmed by [name] [phone]) on 04/07/2021 10:45:54 AM  Radiology No results found.  Procedures Procedures   Medications Ordered in ED Medications  sodium chloride 0.9 % bolus 1,000 mL (0 mLs Intravenous Stopped 04/07/21 1209)  ondansetron (ZOFRAN) injection 4 mg (4 mg Intravenous Given 04/07/21 1104)  ketorolac (TORADOL) 30 MG/ML injection 30 mg (30 mg Intravenous Given 04/07/21 1104)    ED Course  I have reviewed the triage vital signs and the nursing notes.  Pertinent labs & imaging results that were available during my care of the patient were reviewed by me and considered in my medical decision making (see chart for details).    MDM Rules/Calculators/A&P                          Despite complaints of generalized weakness the patient has an unremarkable exam.  He is not tachycardic with a heart rate of 76, EKG shows some mild poor R wave progression but no signs of acute ischemia or arrhythmia.  There may be some element of dehydration, this may be related to renal function or uremia given that he is on multiple medications that could affect the kidneys including lisinopril and colchicine.  He is agreeable to lab work IV fluids and some medication for his discomfort.  We will stick with anti-inflammatories at this time  Pt has normal  w/u including labs, urine and reassuring VS - stable for d/c - no focal neuro sx.  Likely had some dehydration.  Final Clinical Impression(s) / ED Diagnoses Final diagnoses:  Dehydration  Near syncope    Rx / DC Orders ED  Discharge Orders          Ordered    ondansetron (ZOFRAN ODT) 4 MG disintegrating tablet  Every 8 hours PRN        04/07/21 1233             [name], MD 04/07/21 1238

## 2021-04-07 NOTE — ED Notes (Addendum)
Water provided for fluid challenge. Pt made aware to provide urine sample.

## 2021-04-08 ENCOUNTER — Other Ambulatory Visit: Payer: Self-pay

## 2021-04-08 ENCOUNTER — Encounter (HOSPITAL_COMMUNITY): Payer: Self-pay

## 2021-04-08 ENCOUNTER — Emergency Department (HOSPITAL_COMMUNITY)
Admission: EM | Admit: 2021-04-08 | Discharge: 2021-04-08 | Disposition: A | Discharge status: Home / Self Care | Payer: Medicaid Other | Attending: Emergency Medicine | Admitting: Emergency Medicine

## 2021-04-08 DIAGNOSIS — I10 Essential (primary) hypertension: Secondary | ICD-10-CM | POA: Diagnosis not present

## 2021-04-08 DIAGNOSIS — Z79899 Other long term (current) drug therapy: Secondary | ICD-10-CM | POA: Diagnosis not present

## 2021-04-08 DIAGNOSIS — Z20822 Contact with and (suspected) exposure to covid-19: Secondary | ICD-10-CM | POA: Diagnosis not present

## 2021-04-08 DIAGNOSIS — T7840XA Allergy, unspecified, initial encounter: Secondary | ICD-10-CM | POA: Diagnosis not present

## 2021-04-08 DIAGNOSIS — Z7901 Long term (current) use of anticoagulants: Secondary | ICD-10-CM | POA: Insufficient documentation

## 2021-04-08 DIAGNOSIS — X58XXXA Exposure to other specified factors, initial encounter: Secondary | ICD-10-CM | POA: Insufficient documentation

## 2021-04-08 DIAGNOSIS — J45909 Unspecified asthma, uncomplicated: Secondary | ICD-10-CM | POA: Insufficient documentation

## 2021-04-08 DIAGNOSIS — F17210 Nicotine dependence, cigarettes, uncomplicated: Secondary | ICD-10-CM | POA: Diagnosis not present

## 2021-04-08 DIAGNOSIS — L509 Urticaria, unspecified: Secondary | ICD-10-CM | POA: Diagnosis not present

## 2021-04-08 LAB — BASIC METABOLIC PANEL
Anion gap: 8 (ref 5–15)
BUN: 16 mg/dL (ref 6–20)
CO2: 25 mmol/L (ref 22–32)
Calcium: 8.9 mg/dL (ref 8.9–10.3)
Chloride: 105 mmol/L (ref 98–111)
Creatinine, Ser: 1.23 mg/dL (ref 0.61–1.24)
GFR, Estimated: >60 mL/min (ref >60)
Glucose, Bld: 88 mg/dL (ref 70–99)
Potassium: 3.5 mmol/L (ref 3.5–5.1)
Sodium: 138 mmol/L (ref 135–145)

## 2021-04-08 LAB — CBC WITH DIFFERENTIAL/PLATELET
Abs Immature Granulocytes: 0.05 10*3/uL (ref 0.00–0.07)
Basophils Absolute: 0.1 10*3/uL (ref 0.0–0.1)
Basophils Relative: 0 %
Eosinophils Absolute: 0.1 10*3/uL (ref 0.0–0.5)
Eosinophils Relative: 1 %
HCT: 49 % (ref 39.0–52.0)
Hemoglobin: 16.8 g/dL (ref 13.0–17.0)
Immature Granulocytes: 0 %
Lymphocytes Relative: 28 %
Lymphs Abs: 3.4 10*3/uL (ref 0.7–4.0)
MCH: 32.8 pg (ref 26.0–34.0)
MCHC: 34.3 g/dL (ref 30.0–36.0)
MCV: 95.7 fL (ref 80.0–100.0)
Monocytes Absolute: 1 10*3/uL (ref 0.1–1.0)
Monocytes Relative: 8 %
Neutro Abs: 7.7 10*3/uL (ref 1.7–7.7)
Neutrophils Relative %: 63 %
Platelets: 339 10*3/uL (ref 150–400)
RBC: 5.12 MIL/uL (ref 4.22–5.81)
RDW: 12.3 % (ref 11.5–15.5)
WBC: 12.2 10*3/uL — ABNORMAL HIGH (ref 4.0–10.5)
nRBC: 0 % (ref 0.0–0.2)

## 2021-04-08 MED ORDER — FAMOTIDINE 20 MG PO TABS: 20.0000 mg | ORAL_TABLET | Freq: Two times a day (BID) | ORAL | 0 refills | Status: DC

## 2021-04-08 MED ORDER — SODIUM CHLORIDE 0.9 % IV BOLUS
1000.0000 mL | Freq: Once | INTRAVENOUS | Status: AC
  Administered 2021-04-08: 1000 mL via INTRAVENOUS

## 2021-04-08 MED ORDER — FAMOTIDINE IN NACL 20-0.9 MG/50ML-% IV SOLN
20.0000 mg | Freq: Once | INTRAVENOUS | Status: AC
  Administered 2021-04-08: 20 mg via INTRAVENOUS
  Filled 2021-04-08: qty 50

## 2021-04-08 MED ORDER — EPINEPHRINE 0.3 MG/0.3ML IJ SOAJ: 0.3000 mg | INTRAMUSCULAR | 0 refills | Status: DC | PRN

## 2021-04-08 MED ORDER — DIPHENHYDRAMINE HCL 50 MG/ML IJ SOLN
25.0000 mg | Freq: Once | INTRAMUSCULAR | Status: AC
  Administered 2021-04-08: 25 mg via INTRAVENOUS
  Filled 2021-04-08: qty 1

## 2021-04-08 MED ORDER — METHYLPREDNISOLONE SODIUM SUCC 125 MG IJ SOLR
125.0000 mg | Freq: Once | INTRAMUSCULAR | Status: AC
  Administered 2021-04-08: 125 mg via INTRAVENOUS
  Filled 2021-04-08: qty 2

## 2021-04-08 MED ORDER — PREDNISONE 50 MG PO TABS: ORAL_TABLET | ORAL | 0 refills | Status: DC

## 2021-04-08 NOTE — ED Triage Notes (Signed)
Pt arrived via POV with hives noted all over body. Pt states that he is not sure what may have caused them. Itch very bad . Noticed the rash at 0100

## 2021-04-08 NOTE — Discharge Instructions (Addendum)
Take the Steroids as prescribed.  You may use antihistamines as needed for itching.  Avoid zofran or toradol as you may be reacting to that.  Use the epinephrine pen for severe allergic reaction only.  Use the epinephrine pen you must come to the hospital.  Return to the ED with difficulty breathing, chest pain, shortness of breath, throat or tongue swelling or other concerns

## 2021-04-08 NOTE — ED Provider Notes (Signed)
[facility] EMERGENCY DEPARTMENT Provider Note   CSN: [id number] Arrival date & time: 04/08/21  0406     History Chief Complaint  Patient presents with   Allergic Reaction    Lance Cohen is a 50 y.o. male.  Patient with history of DVT on  xarelto, hypertension, myofascial pain syndrome here with hives that onset around 1 AM.  Describes hives all over involving his abdomen, chest, back, arms and legs.  Denies any difficulty breathing or difficulty swallowing.  Denies any tongue or lip swelling.  He is not certain what he is reacting to.  Has a history of allergy to bee venom, penicillins and sulfa.  Denies any new medications, exposures, clothing. Was seen in the ED yesterday for dehydration and given Toradol and zofran.  He believes he has had Zofran before but is not sure about Toradol.  He was sent home with Zofran prescription which he has not filled yet. patient states one of his gout medications interacts with his Xarelto he was told to stop taking it. No other medications or new  The history is provided by the patient.  Allergic Reaction Presenting symptoms: rash       Past Medical History:  Diagnosis Date   Anxiety    Arthritis    Asthma    Bipolar 1 disorder ([place])    Chronic knee pain    Depression    DVT (deep venous thrombosis) ([place])    Hypertension    Insomnia    Sleep apnea    Spinal stenosis    Suicide attempt ([place])    2010 Intentional overdose attempt with Depakote.    Patient Active Problem List   Diagnosis Date Noted   Closed fracture of right distal radius and ulna 12/21/2020   Closed fracture of right forearm    Daily headache 11/27/2020   Dizzy spells 11/27/2020   Seizure ([place]) 11/27/2020   Myofascial pain dysfunction syndrome 11/22/2020   Cocaine abuse ([place]) 11/18/2020   Foreign body (FB) in soft tissue 09/08/2020   Chronic low back pain 05/11/2020   Shoulder pain 05/11/2020   Headache 05/11/2020   Cervical radiculopathy 02/18/2020    Impingement syndrome of right shoulder 01/18/2020   Rectal pain 06/29/2019   GERD (gastroesophageal reflux disease) 06/29/2019   Rectal bleeding 02/22/2019   Priapism 02/08/2019   Chest pain 10/19/2017   Asthma 10/19/2017   Hypertension 10/19/2017   Arthralgia of right knee 10/19/2017   Pain in limb 12/27/2016   Acute pulmonary embolism ([place]) 10/23/2016   Nausea and vomiting 10/23/2016   S/P IVC filter 10/23/2016   Tobacco abuse counseling 10/23/2016   Chronic deep vein thrombosis (DVT) ([place]) 10/18/2016   Cellulitis of foot 06/24/2011   Bipolar 1 disorder ([place]) 06/24/2011   Tobacco abuse 06/24/2011   Polycythemia 06/24/2011   Tinea pedis 06/24/2011   CARPAL TUNNEL SYNDROME, RIGHT 06/21/2010   CONTUSION, LEFT FOOT 06/05/2010   FOREIGN BODY, FOOT 04/17/2010    Past Surgical History:  Procedure Laterality Date   COLONOSCOPY WITH PROPOFOL N/A 03/18/2019   Procedure: COLONOSCOPY WITH PROPOFOL;  Surgeon: [name], MD;  [facility];  Service: Endoscopy;  Laterality: N/A;  2:30pm   FOOT SURGERY     had peice of wire removed   IVC FILTER REMOVAL N/A 02/24/2017   Procedure: IVC Filter Removal;  Surgeon: [name], MD;  Location: [place] [facility];  Service: Cardiovascular;  Laterality: N/A;   ORIF RADIAL FRACTURE Right 12/05/2020  Procedure: OPEN REDUCTION INTERNAL FIXATION (ORIF) RADIAL FRACTURE and ULNA FRACTURE;  Surgeon: Carole Civil, MD;  Location: [facility];  Service: Orthopedics;  Laterality: Right;   PERIPHERAL VASCULAR CATHETERIZATION Right 10/22/2016   Procedure: Thrombectomy, thrombolyisis;  Surgeon: [name], MD;  Location: [place] [facility];  Service: Cardiovascular;  Laterality: Right;   PERIPHERAL VASCULAR CATHETERIZATION N/A 10/22/2016   Procedure: IVC Filter Insertion;  Surgeon: [name], MD;  Location: [place] [facility];  Service: Cardiovascular;  Laterality: N/A;   POLYPECTOMY  03/18/2019   Procedure: POLYPECTOMY;  Surgeon:  [name], MD;  [facility];  Service: Endoscopy;;       Family History  Problem Relation Age of Onset   Cancer Mother    Heart attack Father    Colon cancer Neg Hx     Social History   Tobacco Use   Smoking status: Every Day    Packs/day: 0.50    Pack years: 0.00    Types: Cigarettes   Smokeless tobacco: Never  Vaping Use   Vaping Use: Never used  Substance Use Topics   Alcohol use: Yes    Comment: seldom   Drug use: Not Currently    Types: Marijuana    Comment: smokes for pain    Home Medications Prior to Admission medications   Medication Sig Start Date End Date Taking? Authorizing Provider  albuterol (VENTOLIN HFA) 108 (90 Base) MCG/ACT inhaler Inhale 1-2 puffs into the lungs every 6 (six) hours as needed for wheezing or shortness of breath. 01/17/21   Dorie Rank, MD  allopurinol (ZYLOPRIM) 100 MG tablet Take 1 tablet (100 mg total) by mouth daily. 09/12/20   Rosemarie Ax, MD  colchicine 0.6 MG tablet Take 1 tablet (0.6 mg total) by mouth 2 (two) times daily. 04/05/21   Carole Civil, MD  doxepin (SINEQUAN) 10 MG capsule TAKE (1) CAPSULE BY MOUTHCONCE DAILY. Patient not taking: Reported on 03/29/2021 12/07/20   [provider]  ondansetron (ZOFRAN ODT) 4 MG disintegrating tablet Take 1 tablet (4 mg total) by mouth every 8 (eight) hours as needed for nausea. 04/07/21   [name], MD  rivaroxaban (XARELTO) 20 MG TABS tablet Take 20 mg by mouth daily with breakfast.    [provider]    Allergies    Bee venom, Penicillins, and Sulfa antibiotics  Review of Systems   Review of Systems  Constitutional:  Negative for activity change, appetite change and fever.  HENT:  Negative for congestion and nosebleeds.   Respiratory:  Negative for cough, chest tightness and shortness of breath.   Cardiovascular:  Negative for chest pain.  Gastrointestinal:  Negative for abdominal pain, nausea and vomiting.  Genitourinary:  Negative  for dysuria.  Musculoskeletal:  Negative for arthralgias and myalgias.  Skin:  Positive for rash.  Neurological:  Negative for dizziness, weakness and headaches.   all other systems are negative except as noted in the HPI and PMH.   Physical Exam Updated Vital Signs BP 127/73 (BP Location: Right Arm)   Pulse 89   Temp (!) 97.2 F (36.2 C) (Oral)   Resp 18   Ht 5\' 11"  (1.803 m)   Wt 103 kg   SpO2 94%   BMI 31.66 kg/m   Physical Exam Vitals and nursing note reviewed.  Constitutional:      General: He is not in acute distress.    Appearance: He is well-developed.  HENT:     Head:  Normocephalic and atraumatic.     Mouth/Throat:     Pharynx: No oropharyngeal exudate.     Comments: No tongue or lip swelling Eyes:     Conjunctiva/sclera: Conjunctivae normal.     Pupils: Pupils are equal, round, and reactive to light.  Neck:     Comments: No meningismus. Cardiovascular:     Rate and Rhythm: Normal rate and regular rhythm.     Heart sounds: Normal heart sounds. No murmur heard. Pulmonary:     Effort: Pulmonary effort is normal. No respiratory distress.     Breath sounds: Normal breath sounds.  Abdominal:     Palpations: Abdomen is soft.     Tenderness: There is no abdominal tenderness. There is no guarding or rebound.  Musculoskeletal:        General: No tenderness. Normal range of motion.     Cervical back: Normal range of motion and neck supple.  Skin:    General: Skin is warm.     Findings: Rash present.     Comments: Diffuse urticaria involving arms, legs, back and stomach  Neurological:     Mental Status: He is alert and oriented to person, place, and time.     Cranial Nerves: No cranial nerve deficit.     Motor: No abnormal muscle tone.     Coordination: Coordination normal.     Comments: No ataxia on finger to nose bilaterally. No pronator drift. 5/5 strength throughout. CN 2-12 intact.Equal grip strength. Sensation intact.   Psychiatric:        Behavior:  Behavior normal.    ED Results / Procedures / Treatments   Labs (all labs ordered are listed, but only abnormal results are displayed) Labs Reviewed  CBC WITH DIFFERENTIAL/PLATELET  BASIC METABOLIC PANEL    EKG None  Radiology No results found.  Procedures Procedures   Medications Ordered in ED Medications  methylPREDNISolone sodium succinate (SOLU-MEDROL) 125 mg/2 mL injection 125 mg (has no administration in time range)  diphenhydrAMINE (BENADRYL) injection 25 mg (has no administration in time range)  famotidine (PEPCID) IVPB 20 mg premix (has no administration in time range)  sodium chloride 0.9 % bolus 1,000 mL (has no administration in time range)    ED Course  I have reviewed the triage vital signs and the nursing notes.  Pertinent labs & imaging results that were available during my care of the patient were reviewed by me and considered in my medical decision making (see chart for details).    MDM Rules/Calculators/A&P                         Urticaria without evidence of anaphylaxis.  Patient given steroids, antihistamines and Benadryl.  Did receive Toradol yesterday which may be culprit. Zofran also considered.   Patient improving with steroids, antihistamines and Benadryl.  No indication for epinephrine.  No tongue or lip swelling.  No chest pain or shortness of breath  Observed in the ED with no deterioration of condition.  Will give steroids and antihistamines for home use.  Cautioned to avoid Toradol and Zofran in the future as this may be contributing. Epinephrine pen given for home use with instructions.  Return to the ED if he develops chest pain, shortness of breath, tongue or lip swelling, or any other concerns.  Final Clinical Impression(s) / ED Diagnoses Final diagnoses:  Urticaria    Rx / DC Orders ED Discharge Orders     None  Ezequiel Essex, MD 04/08/21 [phone]

## 2021-04-09 ENCOUNTER — Ambulatory Visit (INDEPENDENT_AMBULATORY_CARE_PROVIDER_SITE_OTHER): Payer: Medicaid Other | Admitting: Vascular Surgery

## 2021-04-09 ENCOUNTER — Encounter (INDEPENDENT_AMBULATORY_CARE_PROVIDER_SITE_OTHER): Payer: Self-pay | Admitting: Vascular Surgery

## 2021-04-09 VITALS — BP 114/69 | HR 57 | Ht 71.0 in | Wt 227.0 lb

## 2021-04-09 DIAGNOSIS — K219 Gastro-esophageal reflux disease without esophagitis: Secondary | ICD-10-CM

## 2021-04-09 DIAGNOSIS — J45909 Unspecified asthma, uncomplicated: Secondary | ICD-10-CM

## 2021-04-09 DIAGNOSIS — I82511 Chronic embolism and thrombosis of right femoral vein: Secondary | ICD-10-CM | POA: Diagnosis not present

## 2021-04-09 DIAGNOSIS — I10 Essential (primary) hypertension: Secondary | ICD-10-CM

## 2021-04-09 LAB — SARS CORONAVIRUS 2 (TAT 6-24 HRS): SARS Coronavirus 2: NEGATIVE

## 2021-04-09 MED ORDER — RIVAROXABAN 20 MG PO TABS: 20.0000 mg | ORAL_TABLET | Freq: Every day | ORAL | 11 refills | Status: DC

## 2021-04-09 NOTE — Progress Notes (Signed)
MRN : [id number]  Lance Cohen is a 50 y.o. (1970-10-29) male who presents with chief complaint of  Chief Complaint  Patient presents with   Follow-up    6 Mo FU  .  History of Present Illness:    The patient presents to the office for follow up regarding right femoral DVT.  DVT was identified at [facility] by Duplex ultrasound about 6 months ago.  The initial symptoms were pain and swelling in the lower extremity.  The patient notes the leg continues to be very painful with dependency and swells quite a bite.  Symptoms are much better with elevation.  The patient notes minimal edema in the morning which steadily worsens throughout the day.    He has a history of chronic DVT, as well as a history of PE.  The patient has not been using compression therapy at this point.  No SOB or pleuritic chest pains.  No cough or hemoptysis.  No blood per rectum or blood in any sputum.  No excessive bruising per the patient.       Current Meds  Medication Sig   albuterol (VENTOLIN HFA) 108 (90 Base) MCG/ACT inhaler Inhale 1-2 puffs into the lungs every 6 (six) hours as needed for wheezing or shortness of breath.   allopurinol (ZYLOPRIM) 100 MG tablet Take 1 tablet (100 mg total) by mouth daily.   colchicine 0.6 MG tablet Take 1 tablet (0.6 mg total) by mouth 2 (two) times daily.   doxepin (SINEQUAN) 10 MG capsule    EPINEPHrine 0.3 mg/0.3 mL IJ SOAJ injection Inject 0.3 mg into the muscle as needed for anaphylaxis (For severe allergic reaction only with difficulty breathing, tongue swelling or throat swelling.).   famotidine (PEPCID) 20 MG tablet Take 1 tablet (20 mg total) by mouth 2 (two) times daily.   predniSONE (DELTASONE) 50 MG tablet As directed   rivaroxaban (XARELTO) 20 MG TABS tablet Take 20 mg by mouth daily with breakfast.    Past Medical History:  Diagnosis Date   Anxiety    Arthritis    Asthma    Bipolar 1 disorder (HCC)    Chronic knee pain    Depression    DVT (deep  venous thrombosis) ([place])    Hypertension    Insomnia    Sleep apnea    Spinal stenosis    Suicide attempt ([place])    2010 Intentional overdose attempt with Depakote.    Past Surgical History:  Procedure Laterality Date   COLONOSCOPY WITH PROPOFOL N/A 03/18/2019   Procedure: COLONOSCOPY WITH PROPOFOL;  Surgeon: [name], MD;  [facility];  Service: Endoscopy;  Laterality: N/A;  2:30pm   FOOT SURGERY     had peice of wire removed   IVC FILTER REMOVAL N/A 02/24/2017   Procedure: IVC Filter Removal;  Surgeon: [name], MD;  Location: [place] CV LAB;  Service: Cardiovascular;  Laterality: N/A;   ORIF RADIAL FRACTURE Right 12/05/2020   Procedure: OPEN REDUCTION INTERNAL FIXATION (ORIF) RADIAL FRACTURE and ULNA FRACTURE;  Surgeon: Carole Civil, MD;  Location: [facility];  Service: Orthopedics;  Laterality: Right;   PERIPHERAL VASCULAR CATHETERIZATION Right 10/22/2016   Procedure: Thrombectomy, thrombolyisis;  Surgeon: [name], MD;  Location: [place] [facility];  Service: Cardiovascular;  Laterality: Right;   PERIPHERAL VASCULAR CATHETERIZATION N/A 10/22/2016   Procedure: IVC Filter Insertion;  Surgeon: [name], MD;  Location: [place] [facility] LAB;  Service: Cardiovascular;  Laterality: N/A;   POLYPECTOMY  03/18/2019   Procedure: POLYPECTOMY;  Surgeon: [name], MD;  [facility];  Service: Endoscopy;;    Social History Social History   Tobacco Use   Smoking status: Every Day    Packs/day: 0.50    Pack years: 0.00    Types: Cigarettes   Smokeless tobacco: Never  Vaping Use   Vaping Use: Never used  Substance Use Topics   Alcohol use: Yes    Comment: seldom   Drug use: Not Currently    Types: Marijuana    Comment: smokes for pain    Family History Family History  Problem Relation Age of Onset   Cancer Mother    Heart attack Father    Colon cancer Neg Hx     Allergies  Allergen Reactions   Bee Venom Swelling    Penicillins Other (See Comments)    PT states that he had symptoms that felt like he was having a heart attack Has patient had a PCN reaction causing immediate rash, facial/tongue/throat swelling, SOB or lightheadedness with hypotension: No Has patient had a PCN reaction causing severe rash involving mucus membranes or skin necrosis: No Has patient had a PCN reaction that required hospitalization No Has patient had a PCN reaction occurring within the last 10 years: No If all of the above answers are "NO", then may proceed with Cephal   Zofran [Ondansetron] Hives   Sulfa Antibiotics Itching     REVIEW OF SYSTEMS (Negative unless checked)  Constitutional: [] Weight loss  [] Fever  [] Chills Cardiac: [] Chest pain   [] Chest pressure   [] Palpitations   [] Shortness of breath when laying flat   [] Shortness of breath with exertion. Vascular:  [] Pain in legs with walking   [x] Pain in legs at rest  [x] History of DVT   [] Phlebitis   [] Swelling in legs   [] Varicose veins   [] Non-healing ulcers Pulmonary:   [] Uses home oxygen   [] Productive cough   [] Hemoptysis   [] Wheeze  [] COPD   [] Asthma Neurologic:  [] Dizziness   [] Seizures   [] History of stroke   [] History of TIA  [] Aphasia   [] Vissual changes   [] Weakness or numbness in arm   [] Weakness or numbness in leg Musculoskeletal:   [] Joint swelling   [x] Joint pain   [] Low back pain Hematologic:  [] Easy bruising  [] Easy bleeding   [] Hypercoagulable state   [] Anemic Gastrointestinal:  [] Diarrhea   [] Vomiting  [x] Gastroesophageal reflux/heartburn   [] Difficulty swallowing. Genitourinary:  [] Chronic kidney disease   [] Difficult urination  [] Frequent urination   [] Blood in urine Skin:  [] Rashes   [] Ulcers  Psychological:  [] History of anxiety   []  History of major depression.  Physical Examination  Vitals:   04/09/21 0945  BP: 114/69  Pulse: (!) 57  Weight: 227 lb (103 kg)  Height: 5\' 11"  (1.803 m)   Body mass index is 31.66 kg/m. Gen: WD/WN,  NAD Head: [place]/AT, No temporalis wasting.  Ear/Nose/Throat: Hearing grossly intact, nares w/o erythema or drainage Eyes: PER, EOMI, sclera nonicteric.  Neck: Supple, no large masses.   Pulmonary:  Good air movement, no audible wheezing bilaterally, no use of accessory muscles.  Cardiac: RRR, no JVD Vascular:  scattered varicosities present bilaterally.  Mild venous stasis changes to the legs bilaterally.  2+ soft pitting edema  Vessel Right Left  Radial Palpable Palpable  Gastrointestinal: Non-distended. No guarding/no peritoneal signs.  Musculoskeletal: M/S 5/5 throughout.  No deformity or atrophy.  Neurologic: CN 2-12 intact. Symmetrical.  Speech is fluent. Motor exam as listed above. Psychiatric: Judgment intact, Mood & affect appropriate for pt's clinical situation. Dermatologic: Mild venous rashes no ulcers noted.  No changes consistent with cellulitis. Lymph : No lichenification or skin changes of chronic lymphedema.  CBC Lab Results  Component Value Date   WBC 12.2 (H) 04/08/2021   HGB 16.8 04/08/2021   HCT 49.0 04/08/2021   MCV 95.7 04/08/2021   PLT 339 04/08/2021    BMET    Component Value Date/Time   NA 138 04/08/2021 0458   NA 138 03/21/2021 1300   K 3.5 04/08/2021 0458   CL 105 04/08/2021 0458   CO2 25 04/08/2021 0458   GLUCOSE 88 04/08/2021 0458   BUN 16 04/08/2021 0458   BUN 8 03/21/2021 1300   CREATININE 1.23 04/08/2021 0458   CALCIUM 8.9 04/08/2021 0458   GFRNONAA >60 04/08/2021 0458   GFRAA >60 07/03/2019 1345   Estimated Creatinine Clearance: 88.8 mL/min (by C-G formula based on SCr of 1.23 mg/dL).  COAG Lab Results  Component Value Date   INR 1.11 10/22/2016   INR 1.06 10/18/2016    Radiology DG Forearm Right  Result Date: 03/23/2021 CLINICAL DATA:  50 year old male with history of forearm fracture. EXAM: RIGHT FOREARM - 2 VIEW COMPARISON:  Mar 05, 2021. FINDINGS: Signs of calcification at the expected location of intraosseous membrane. No bony  resorption along cortical plate and screw fixation of ulnar and radial fractures at the junction of middle and distal of the radius and ulna. No significant overlying soft tissue swelling. No sign of new fracture. IMPRESSION: Callus formation and heterotopic ossification about forearm fractures post cortical plate and screw fixation. No bony resorptive changes or acute findings. Electronically Signed   By: Zetta Bills M.D.   On: 03/23/2021 10:53   US Venous Img Upper Uni Right(DVT)  Result Date: 03/23/2021 CLINICAL DATA:  Right arm swelling EXAM: RIGHT UPPER EXTREMITY VENOUS DOPPLER ULTRASOUND TECHNIQUE: Gray-scale sonography with graded compression, as well as color Doppler and duplex ultrasound were performed to evaluate the upper extremity deep venous system from the level of the subclavian vein and including the jugular, axillary, basilic, radial, ulnar and upper cephalic vein. Spectral Doppler was utilized to evaluate flow at rest and with distal augmentation maneuvers. COMPARISON:  None. FINDINGS: Contralateral Subclavian Vein: Respiratory phasicity is normal and symmetric with the symptomatic side. No evidence of thrombus. Normal compressibility. Internal Jugular Vein: No evidence of thrombus. Normal compressibility, respiratory phasicity and response to augmentation. Subclavian Vein: No evidence of thrombus. Normal compressibility, respiratory phasicity and response to augmentation. Axillary Vein: No evidence of thrombus. Normal compressibility, respiratory phasicity and response to augmentation. Cephalic Vein: No evidence of thrombus. Normal compressibility, respiratory phasicity and response to augmentation. Basilic Vein: No evidence of thrombus. Normal compressibility, respiratory phasicity and response to augmentation. Brachial Veins: No evidence of thrombus. Normal compressibility, respiratory phasicity and response to augmentation. Radial Veins: No evidence of thrombus. Normal compressibility,  respiratory phasicity and response to augmentation. Ulnar Veins: No evidence of thrombus. Normal compressibility, respiratory phasicity and response to augmentation. Venous Reflux:  None visualized. Other Findings:  None visualized. IMPRESSION: No evidence of DVT within the right upper extremity. Electronically Signed   By: Jacqulynn Cadet M.D.   On: 03/23/2021 11:58     Assessment/Plan 1. Chronic deep vein thrombosis (DVT) of femoral vein of right lower extremity (HCC) Recommend:   No surgery or intervention at this point in time.  IVC filter is not indicated at  present.  Patient's previous duplex ultrasound of the venous system shows chronic DVT in the popliteal to the femoral veins.  The patient is on Xarelto and we will continue to prescribe it.  Elevation was stressed, use of a recliner was discussed.  The patient should wear graduated compression stockings class 1 (20-30 mmHg), on a daily basis a prescription was given. The patient will begin wearing the stockings first thing in the morning and removing them in the evening. The patient is specifically not to sleep in the stockings.  In addition, behavioral modification including elevation during the day and avoidance of prolonged dependency will be initiated.    The patient will continue anticoagulation for now as there have not been any problems or complications at this point.    2. Hypertension, unspecified type Continue antihypertensive medications as already ordered, these medications have been reviewed and there are no changes at this time.   3. Uncomplicated asthma, unspecified asthma severity, unspecified whether persistent Continue pulmonary medications and aerosols as already ordered, these medications have been reviewed and there are no changes at this time.    4. Gastroesophageal reflux disease, unspecified whether esophagitis present Continue PPI as already ordered, this medication has been reviewed and there are no  changes at this time.  Avoidence of caffeine and alcohol  Moderate elevation of the head of the bed      Hortencia Pilar, MD  04/09/2021 9:48 AM

## 2021-04-10 ENCOUNTER — Ambulatory Visit (HOSPITAL_COMMUNITY): Payer: Medicaid Other | Admitting: Physical Therapy

## 2021-04-12 ENCOUNTER — Encounter (HOSPITAL_COMMUNITY): Payer: Self-pay | Admitting: Emergency Medicine

## 2021-04-12 ENCOUNTER — Telehealth: Payer: Self-pay | Admitting: Orthopedic Surgery

## 2021-04-12 ENCOUNTER — Emergency Department (HOSPITAL_COMMUNITY)
Admission: EM | Admit: 2021-04-12 | Discharge: 2021-04-12 | Disposition: A | Discharge status: Home / Self Care | Payer: Medicaid Other | Attending: Emergency Medicine | Admitting: Emergency Medicine

## 2021-04-12 ENCOUNTER — Other Ambulatory Visit: Payer: Self-pay

## 2021-04-12 DIAGNOSIS — Z79899 Other long term (current) drug therapy: Secondary | ICD-10-CM | POA: Insufficient documentation

## 2021-04-12 DIAGNOSIS — J45909 Unspecified asthma, uncomplicated: Secondary | ICD-10-CM | POA: Diagnosis not present

## 2021-04-12 DIAGNOSIS — Z7901 Long term (current) use of anticoagulants: Secondary | ICD-10-CM | POA: Insufficient documentation

## 2021-04-12 DIAGNOSIS — G8929 Other chronic pain: Secondary | ICD-10-CM | POA: Insufficient documentation

## 2021-04-12 DIAGNOSIS — F17210 Nicotine dependence, cigarettes, uncomplicated: Secondary | ICD-10-CM | POA: Insufficient documentation

## 2021-04-12 DIAGNOSIS — M542 Cervicalgia: Secondary | ICD-10-CM | POA: Insufficient documentation

## 2021-04-12 DIAGNOSIS — M25511 Pain in right shoulder: Secondary | ICD-10-CM | POA: Insufficient documentation

## 2021-04-12 DIAGNOSIS — R519 Headache, unspecified: Secondary | ICD-10-CM | POA: Diagnosis not present

## 2021-04-12 DIAGNOSIS — I10 Essential (primary) hypertension: Secondary | ICD-10-CM | POA: Insufficient documentation

## 2021-04-12 DIAGNOSIS — M25512 Pain in left shoulder: Secondary | ICD-10-CM | POA: Insufficient documentation

## 2021-04-12 DIAGNOSIS — M546 Pain in thoracic spine: Secondary | ICD-10-CM | POA: Insufficient documentation

## 2021-04-12 MED ORDER — OXYCODONE-ACETAMINOPHEN 5-325 MG PO TABS: 1.0000 | ORAL_TABLET | ORAL | 0 refills | Status: DC | PRN

## 2021-04-12 MED ORDER — METHOCARBAMOL 500 MG PO TABS: 500.0000 mg | ORAL_TABLET | Freq: Three times a day (TID) | ORAL | 0 refills | Status: DC

## 2021-04-12 MED ORDER — METHOCARBAMOL 500 MG PO TABS
500.0000 mg | ORAL_TABLET | Freq: Once | ORAL | Status: AC
  Administered 2021-04-12: 500 mg via ORAL
  Filled 2021-04-12: qty 1

## 2021-04-12 MED ORDER — OXYCODONE-ACETAMINOPHEN 5-325 MG PO TABS
1.0000 | ORAL_TABLET | Freq: Once | ORAL | Status: AC
Start: 2021-04-12 — End: 2021-04-12
  Administered 2021-04-12: 1 via ORAL
  Filled 2021-04-12: qty 1

## 2021-04-12 NOTE — Discharge Instructions (Addendum)
You may try alternating ice and heat to your upper back and neck.  Follow-up with your primary care provider or your orthopedic provider.

## 2021-04-12 NOTE — ED Triage Notes (Signed)
Pt c/o back, neck and head pain.

## 2021-04-12 NOTE — Telephone Encounter (Signed)
Call received from patient relaying that he "has a back doctor in [facility]" but "for whatever reason can't get through to them". Also unable to get with primary care; states they have only PA's there, so he doesn't think that they can help him. Relays his back and neck are really hurting, and that he has a bad headache. I relayed that since he is currently treating with specialist, and also that what he is treating for is beyond what we treat in our clinic, recommendation is to try again to reach his current specialist.

## 2021-04-12 NOTE — ED Provider Notes (Signed)
[facility] EMERGENCY DEPARTMENT Provider Note   CSN: [id number] Arrival date & time: 04/12/21  3007     History Chief Complaint  Patient presents with   Back Pain    CURLIE MACKEN is a 50 y.o. male.   Back Pain Associated symptoms: headaches   Associated symptoms: no abdominal pain, no chest pain, no dysuria, no fever, no numbness and no weakness       HELIODORO DOMAGALSKI is a 50 y.o. male who presents to the Emergency Department complaining of upper back and neck pain.  He has a hx of same and states the pain has been gradually worsening for several days.  He stopped going to his pain clinic and no longer taking his suboxone. He describes aching pain from his upper back, across his shoulders and into his neck.   Pain also radiating into back of his head and radiating forward.  Pain similar to previous, no injury.  He denies fever, chills, numbness or weakness of his extremities.  No visual changes.    Past Medical History:  Diagnosis Date   Anxiety    Arthritis    Asthma    Bipolar 1 disorder ([place])    Chronic knee pain    Depression    DVT (deep venous thrombosis) ([place])    Hypertension    Insomnia    Sleep apnea    Spinal stenosis    Suicide attempt ([place])    2010 Intentional overdose attempt with Depakote.    Patient Active Problem List   Diagnosis Date Noted   Closed fracture of right distal radius and ulna 12/21/2020   Closed fracture of right forearm    Daily headache 11/27/2020   Dizzy spells 11/27/2020   Seizure ([place]) 11/27/2020   Myofascial pain dysfunction syndrome 11/22/2020   Cocaine abuse ([place]) 11/18/2020   Foreign body (FB) in soft tissue 09/08/2020   Chronic low back pain 05/11/2020   Shoulder pain 05/11/2020   Headache 05/11/2020   Cervical radiculopathy 02/18/2020   Impingement syndrome of right shoulder 01/18/2020   Rectal pain 06/29/2019   GERD (gastroesophageal reflux disease) 06/29/2019   Rectal bleeding 02/22/2019   Priapism 02/08/2019    Chest pain 10/19/2017   Asthma 10/19/2017   Hypertension 10/19/2017   Arthralgia of right knee 10/19/2017   Pain in limb 12/27/2016   Acute pulmonary embolism ([place]) 10/23/2016   Nausea and vomiting 10/23/2016   S/P IVC filter 10/23/2016   Tobacco abuse counseling 10/23/2016   Chronic deep vein thrombosis (DVT) ([place]) 10/18/2016   Cellulitis of foot 06/24/2011   Bipolar 1 disorder ([place]) 06/24/2011   Tobacco abuse 06/24/2011   Polycythemia 06/24/2011   Tinea pedis 06/24/2011   CARPAL TUNNEL SYNDROME, RIGHT 06/21/2010   CONTUSION, LEFT FOOT 06/05/2010   FOREIGN BODY, FOOT 04/17/2010    Past Surgical History:  Procedure Laterality Date   COLONOSCOPY WITH PROPOFOL N/A 03/18/2019   Procedure: COLONOSCOPY WITH PROPOFOL;  Surgeon: [name], MD;  [facility];  Service: Endoscopy;  Laterality: N/A;  2:30pm   FOOT SURGERY     had peice of wire removed   IVC FILTER REMOVAL N/A 02/24/2017   Procedure: IVC Filter Removal;  Surgeon: [name], MD;  Location: [place] CV LAB;  Service: Cardiovascular;  Laterality: N/A;   ORIF RADIAL FRACTURE Right 12/05/2020   Procedure: OPEN REDUCTION INTERNAL FIXATION (ORIF) RADIAL FRACTURE and ULNA FRACTURE;  Surgeon: Carole Civil, MD;  Location: [facility];  Service: Orthopedics;  Laterality: Right;   PERIPHERAL VASCULAR CATHETERIZATION Right 10/22/2016   Procedure: Thrombectomy, thrombolyisis;  Surgeon: [name], MD;  Location: [place] [facility] LAB;  Service: Cardiovascular;  Laterality: Right;   PERIPHERAL VASCULAR CATHETERIZATION N/A 10/22/2016   Procedure: IVC Filter Insertion;  Surgeon: [name], MD;  Location: [place] [facility];  Service: Cardiovascular;  Laterality: N/A;   POLYPECTOMY  03/18/2019   Procedure: POLYPECTOMY;  Surgeon: [name], MD;  [facility];  Service: Endoscopy;;       Family History  Problem Relation Age of Onset   Cancer Mother    Heart attack Father    Colon cancer  Neg Hx     Social History   Tobacco Use   Smoking status: Every Day    Packs/day: 0.50    Pack years: 0.00    Types: Cigarettes   Smokeless tobacco: Never  Vaping Use   Vaping Use: Never used  Substance Use Topics   Alcohol use: Yes    Comment: seldom   Drug use: Not Currently    Types: Marijuana    Comment: smokes for pain    Home Medications Prior to Admission medications   Medication Sig Start Date End Date Taking? Authorizing Provider  albuterol (VENTOLIN HFA) 108 (90 Base) MCG/ACT inhaler Inhale 1-2 puffs into the lungs every 6 (six) hours as needed for wheezing or shortness of breath. 01/17/21   Dorie Rank, MD  allopurinol (ZYLOPRIM) 100 MG tablet Take 1 tablet (100 mg total) by mouth daily. 09/12/20   Rosemarie Ax, MD  amLODipine (NORVASC) 10 MG tablet Take 1 tablet by mouth daily. 03/29/21   [provider]  colchicine 0.6 MG tablet Take 1 tablet (0.6 mg total) by mouth 2 (two) times daily. 04/05/21   Carole Civil, MD  doxepin [facility]) 10 MG capsule  12/07/20   [provider]  DULoxetine (CYMBALTA) 60 MG capsule  12/26/20   [provider]  EPINEPHrine 0.3 mg/0.3 mL IJ SOAJ injection Inject 0.3 mg into the muscle as needed for anaphylaxis (For severe allergic reaction only with difficulty breathing, tongue swelling or throat swelling.). 04/08/21   Rancour, Annie Main, MD  famotidine (PEPCID) 20 MG tablet Take 1 tablet (20 mg total) by mouth 2 (two) times daily. 04/08/21   Rancour, Annie Main, MD  lisinopril (ZESTRIL) 10 MG tablet Take 1 tablet by mouth daily. 03/29/21   [provider]  predniSONE (DELTASONE) 50 MG tablet As directed 04/08/21   Rancour, Annie Main, MD  QUEtiapine (SEROQUEL) 100 MG tablet Take by mouth. 03/29/21   [provider]  rivaroxaban (XARELTO) 20 MG TABS tablet Take 1 tablet (20 mg total) by mouth daily with breakfast. 04/09/21   Schnier, Dolores Lory, MD  SUBOXONE 2-0.5 MG FILM PLACE 1 FILM UNDER TONGUEHAND ALLOW  TO DISSOLVE TWICE DAILY FOR PAIN. 11/18/20   [provider]    Allergies    Bee venom, Penicillins, Zofran [ondansetron], and Sulfa antibiotics  Review of Systems   Review of Systems  Constitutional:  Negative for chills, fatigue and fever.  HENT:  Negative for congestion.   Eyes:  Negative for visual disturbance.  Respiratory:  Negative for shortness of breath.   Cardiovascular:  Negative for chest pain.  Gastrointestinal:  Negative for abdominal pain, nausea and vomiting.  Genitourinary:  Negative for difficulty urinating, dysuria and hematuria.  Musculoskeletal:  Positive for back pain and neck pain. Negative for arthralgias, myalgias and neck stiffness.  Skin:  Negative  for color change and rash.  Neurological:  Positive for headaches. Negative for dizziness, syncope, speech difficulty, weakness and numbness.  Hematological:  Does not bruise/bleed easily.  Psychiatric/Behavioral:  Negative for confusion.    Physical Exam Updated Vital Signs BP 108/63   Pulse 65   Temp 98.2 F (36.8 C) (Oral)   Resp 17   Ht 5\' 11"  (1.803 m)   Wt 103 kg   SpO2 93%   BMI 31.66 kg/m   Physical Exam Vitals and nursing note reviewed.  Constitutional:      General: He is not in acute distress.    Appearance: Normal appearance. He is not ill-appearing.  HENT:     Head: Normocephalic and atraumatic.     Mouth/Throat:     Mouth: Mucous membranes are moist.  Eyes:     Extraocular Movements: Extraocular movements intact.     Conjunctiva/sclera: Conjunctivae normal.     Pupils: Pupils are equal, round, and reactive to light.  Neck:     Thyroid: No thyromegaly.     Meningeal: Kernig's sign absent.     Comments: Ttp of the bilateral cervical paraspinal muscles.  Tenderness of bilateral trapezius muscles.   Cardiovascular:     Rate and Rhythm: Normal rate and regular rhythm.     Pulses: Normal pulses.  Pulmonary:     Effort: Pulmonary effort is normal.     Breath sounds: Normal  breath sounds. No wheezing.  Musculoskeletal:        General: Tenderness present. No swelling or signs of injury. Normal range of motion.     Cervical back: Normal range of motion. Tenderness present. Muscular tenderness present. No spinous process tenderness.     Comments: Grip strength symmetrical   Skin:    General: Skin is warm.     Capillary Refill: Capillary refill takes less than 2 seconds.     Findings: No rash.  Neurological:     General: No focal deficit present.     Mental Status: He is alert.     Sensory: Sensation is intact. No sensory deficit.     Motor: Motor function is intact. No weakness.     Coordination: Coordination is intact.     Gait: Gait is intact.    ED Results / Procedures / Treatments   Labs (all labs ordered are listed, but only abnormal results are displayed) Labs Reviewed - No data to display  EKG None  Radiology No results found.  Procedures Procedures   Medications Ordered in ED Medications  oxyCODONE-acetaminophen (PERCOCET/ROXICET) 5-325 MG per tablet 1 tablet (has no administration in time range)  methocarbamol (ROBAXIN) tablet 500 mg (has no administration in time range)    ED Course  I have reviewed the triage vital signs and the nursing notes.  Pertinent labs & imaging results that were available during my care of the patient were reviewed by me and considered in my medical decision making (see chart for details).    MDM Rules/Calculators/A&P                          Pt with with likely acute on chronic neck pain.  Upper back pain is muscular tenderness.  NV intact.  No meningeal signs.  Pt well appearing.  No concerning sx's for infectious process.    Pt pain addressed here.  Doubt emergent process.  Pt agreeable to out patient f/u.    Final Clinical Impression(s) / ED Diagnoses Final diagnoses:  Chronic neck pain    Rx / DC Orders ED Discharge Orders     None        Kem Parkinson, PA-C 04/14/21 1629     Milton Ferguson, MD 04/16/21 [phone]

## 2021-04-18 ENCOUNTER — Ambulatory Visit: Payer: Medicaid Other | Admitting: Family Medicine

## 2021-04-18 NOTE — Progress Notes (Deleted)
Lance Cohen - 50 y.o. male MRN [id number]  Date of birth: [birth year]  SUBJECTIVE:  Including CC & ROS.  No chief complaint on file.   Lance Cohen is a 50 y.o. male that is  ***.  ***   Review of Systems See HPI   HISTORY: Past Medical, Surgical, Social, and Family History Reviewed & Updated per EMR.   Pertinent Historical Findings include:  Past Medical History:  Diagnosis Date   Anxiety    Arthritis    Asthma    Bipolar 1 disorder ([place])    Chronic knee pain    Depression    DVT (deep venous thrombosis) ([place])    Hypertension    Insomnia    Sleep apnea    Spinal stenosis    Suicide attempt ([place])    2010 Intentional overdose attempt with Depakote.    Past Surgical History:  Procedure Laterality Date   COLONOSCOPY WITH PROPOFOL N/A 03/18/2019   Procedure: COLONOSCOPY WITH PROPOFOL;  Surgeon: Lance Dolin, MD;  [facility];  Service: Endoscopy;  Laterality: N/A;  2:30pm   FOOT SURGERY     had peice of wire removed   IVC FILTER REMOVAL N/A 02/24/2017   Procedure: IVC Filter Removal;  Surgeon: Lance Huxley, MD;  Location: [place] [facility];  Service: Cardiovascular;  Laterality: N/A;   ORIF RADIAL FRACTURE Right 12/05/2020   Procedure: OPEN REDUCTION INTERNAL FIXATION (ORIF) RADIAL FRACTURE and ULNA FRACTURE;  Surgeon: Lance Civil, MD;  Location: [facility];  Service: Orthopedics;  Laterality: Right;   PERIPHERAL VASCULAR CATHETERIZATION Right 10/22/2016   Procedure: Thrombectomy, thrombolyisis;  Surgeon: Lance Cabal, MD;  Location: [facility];  Service: Cardiovascular;  Laterality: Right;   PERIPHERAL VASCULAR CATHETERIZATION N/A 10/22/2016   Procedure: IVC Filter Insertion;  Surgeon: Lance Cabal, MD;  Location: [facility];  Service: Cardiovascular;  Laterality: N/A;   POLYPECTOMY  03/18/2019   Procedure: POLYPECTOMY;  Surgeon: Lance Dolin, MD;  [facility];  Service: Endoscopy;;    Family History   Problem Relation Age of Onset   Cancer Mother    Heart attack Father    Colon cancer Neg Hx     Social History   Socioeconomic History   Marital status: Married    Spouse name: Not on file   Number of children: Not on file   Years of education: Not on file   Highest education level: Not on file  Occupational History   Not on file  Tobacco Use   Smoking status: Every Day    Packs/day: 0.50    Pack years: 0.00    Types: Cigarettes   Smokeless tobacco: Never  Vaping Use   Vaping Use: Never used  Substance and Sexual Activity   Alcohol use: Yes    Comment: seldom   Drug use: Not Currently    Types: Marijuana    Comment: smokes for pain   Sexual activity: Yes  Other Topics Concern   Not on file  Social History Narrative   Not on file   Social Determinants of Health   Financial Resource Strain: Not on file  Food Insecurity: Not on file  Transportation Needs: Not on file  Physical Activity: Not on file  Stress: Not on file  Social Connections: Not on file  Intimate Partner Violence: Not on file     PHYSICAL EXAM:  VS: There were no vitals taken for this visit. Physical Exam  Gen: NAD, alert, cooperative with exam, well-appearing MSK:  ***      ASSESSMENT & PLAN:   No problem-specific Assessment & Plan notes found for this encounter.

## 2021-04-30 ENCOUNTER — Encounter: Payer: Self-pay | Admitting: Orthopedic Surgery

## 2021-04-30 ENCOUNTER — Ambulatory Visit (INDEPENDENT_AMBULATORY_CARE_PROVIDER_SITE_OTHER): Payer: Medicaid Other | Admitting: Orthopedic Surgery

## 2021-04-30 ENCOUNTER — Other Ambulatory Visit: Payer: Self-pay

## 2021-04-30 VITALS — BP 124/80 | HR 75 | Ht 71.0 in | Wt 220.0 lb

## 2021-04-30 DIAGNOSIS — S52601D Unspecified fracture of lower end of right ulna, subsequent encounter for closed fracture with routine healing: Secondary | ICD-10-CM | POA: Diagnosis not present

## 2021-04-30 DIAGNOSIS — M4302 Spondylolysis, cervical region: Secondary | ICD-10-CM

## 2021-04-30 DIAGNOSIS — S52501D Unspecified fracture of the lower end of right radius, subsequent encounter for closed fracture with routine healing: Secondary | ICD-10-CM | POA: Diagnosis not present

## 2021-04-30 NOTE — Progress Notes (Signed)
FOLLOW UP   Encounter Diagnoses  Name Primary?   Closed fracture of distal ends of right radius and ulna with routine healing, subsequent encounter 12/05/20 Yes   Spondylolysis, cervical region      Chief Complaint  Patient presents with   Post-op Follow-up    Right forearm ORIF 12/05/20     50 year old male status post ORIF right forearm both bones forearm fracture unfortunately had an electrocution from a wall outlet and this caused some pain in his right forearm which has subsequently resolved  His exam shows no erythema no drainage normal wounds normal function of the right upper extremity  His main problem right now is neck pain and headaches, he has cervical spondylosis and is being followed by the pain management and a doctor in [facility] he is scheduled to have physical therapy  He is encouraged to see [place] on an as-needed basis regarding his right forearm if there are any issues that come up.

## 2021-05-01 ENCOUNTER — Encounter (HOSPITAL_COMMUNITY): Payer: Self-pay | Admitting: Physical Therapy

## 2021-05-01 ENCOUNTER — Ambulatory Visit (HOSPITAL_COMMUNITY): Payer: Medicaid Other | Attending: Pain Medicine | Admitting: Physical Therapy

## 2021-05-01 DIAGNOSIS — M542 Cervicalgia: Secondary | ICD-10-CM | POA: Diagnosis not present

## 2021-05-01 DIAGNOSIS — R29898 Other symptoms and signs involving the musculoskeletal system: Secondary | ICD-10-CM

## 2021-05-01 NOTE — Patient Instructions (Signed)
Access Code: [email or web address] URL: [email or web address] Date: 05/01/2021 Prepared by: Mitzi Hansen Abdulaziz Toman  Exercises Supine Chin Tuck - 3 x daily - 7 x weekly - 2 sets - 10 reps - 2 second holds hold Seated Scapular Retraction - 3 x daily - 7 x weekly - 2 sets - 10 reps

## 2021-05-01 NOTE — Therapy (Signed)
[facility] [address] [place], [place], [address] Phone: [phone]   Fax:  [phone]  Physical Therapy Evaluation  Patient Details  Name: Lance Cohen MRN: [id number] Date of Birth: [birth year] Referring Provider (PT): Sherlyn Lees MD   Encounter Date: 05/01/2021   PT End of Session - 05/01/21 0955     Visit Number 1    Number of Visits 8    Date for PT Re-Evaluation 05/29/21    Authorization Type Medicaid Healthy blue ( 8 visits requested- check auth)    Authorization - Visit Number 1    Authorization - Number of Visits 1    PT Start Time [phone]   arrives late   PT Stop Time 0954    PT Time Calculation (min) 25 min    Activity Tolerance Patient tolerated treatment well    Behavior During Therapy [facility] for tasks assessed/performed             Past Medical History:  Diagnosis Date   Anxiety    Arthritis    Asthma    Bipolar 1 disorder ([place])    Chronic knee pain    Depression    DVT (deep venous thrombosis) ([place])    Hypertension    Insomnia    Sleep apnea    Spinal stenosis    Suicide attempt ([place])    2010 Intentional overdose attempt with Depakote.    Past Surgical History:  Procedure Laterality Date   COLONOSCOPY WITH PROPOFOL N/A 03/18/2019   Procedure: COLONOSCOPY WITH PROPOFOL;  Surgeon: [name], MD;  [facility];  Service: Endoscopy;  Laterality: N/A;  2:30pm   FOOT SURGERY     had peice of wire removed   IVC FILTER REMOVAL N/A 02/24/2017   Procedure: IVC Filter Removal;  Surgeon: [name], MD;  Location: [facility] [facility];  Service: Cardiovascular;  Laterality: N/A;   ORIF RADIAL FRACTURE Right 12/05/2020   Procedure: OPEN REDUCTION INTERNAL FIXATION (ORIF) RADIAL FRACTURE and ULNA FRACTURE;  Surgeon: Carole Civil, MD;  [facility];  Service: Orthopedics;  Laterality: Right;   PERIPHERAL VASCULAR CATHETERIZATION Right 10/22/2016   Procedure: Thrombectomy, thrombolyisis;   Surgeon: [name], MD;  Location: [facility];  Service: Cardiovascular;  Laterality: Right;   PERIPHERAL VASCULAR CATHETERIZATION N/A 10/22/2016   Procedure: IVC Filter Insertion;  Surgeon: [name], MD;  Location: [facility];  Service: Cardiovascular;  Laterality: N/A;   POLYPECTOMY  03/18/2019   Procedure: POLYPECTOMY;  Surgeon: [name], MD;  [facility];  Service: Endoscopy;;    There were no vitals filed for this visit.    Subjective Assessment - 05/01/21 0932     Subjective Patient is a 50 y.o. male who presents to physical therapy with c/o cervical radiculopathy. Patient states spinal stenosis. Patient states neck pain with insidious onset of 2-3 years. Increased pain with lifting head. Symptoms decrease with neck pillow. Patient states main goal is to decrease headaches. Patient states almost constant headaches.    Limitations House hold activities;Lifting    Patient Stated Goals decrease headaches    Currently in Pain? Yes    Pain Score 10-Worst pain ever    Pain Location Neck    Pain Descriptors / Indicators Aching    Pain Type Chronic pain    Pain Onset More than a month ago    Pain Frequency Constant  [facility] PT Assessment - 05/01/21 0001       Assessment   Medical Diagnosis Cervical Radiculopathy    Referring Provider (PT) Sherlyn Lees MD    Onset Date/Surgical Date 05/01/18    Next MD Visit unsure      Precautions   Precautions None      Restrictions   Weight Bearing Restrictions No      Balance Screen   Has the patient fallen in the past 6 months No    Has the patient had a decrease in activity level because of a fear of falling?  No    Is the patient reluctant to leave their home because of a fear of falling?  No      Prior Function   Level of Independence Independent    Vocation On disability      Cognition   Overall Cognitive Status Within Functional Limits for tasks assessed       Observation/Other Assessments   Observations Ambulates without AD, slouched in seated, eyes closed most of time    Focus on Therapeutic Outcomes (FOTO)  n/a      Sensation   Light Touch Appears Intact      Posture/Postural Control   Posture/Postural Control Postural limitations    Postural Limitations Rounded Shoulders;Forward head      ROM / Strength   AROM / PROM / Strength AROM;Strength      AROM   Overall AROM Comments pain with all AROM, worst with extension    AROM Assessment Site Cervical    Cervical Flexion 25% limited    Cervical Extension 50% limited    Cervical - Right Side Bend 50% limited    Cervical - Left Side Bend 50% limited    Cervical - Right Rotation 0% limited    Cervical - Left Rotation 0% limited      Strength   Strength Assessment Site Shoulder;Elbow    Right/Left Shoulder Right;Left    Right Shoulder Flexion 4+/5    Left Shoulder Flexion 5/5    Right/Left Elbow Right;Left    Right Elbow Flexion 4/5    Right Elbow Extension 5/5    Left Elbow Flexion 5/5    Left Elbow Extension 5/5      Palpation   Spinal mobility unable to fully assess due to pain    Palpation comment TTP grossly throughout cervical paraspinals, suboccipitials, UT bilaterally                        Objective measurements completed on examination: See above findings.       [facility] Adult PT Treatment/Exercise - 05/01/21 0001       Exercises   Exercises Neck      Neck Exercises: Seated   Other Seated Exercise scap retractions 1x 10      Neck Exercises: Supine   Neck Retraction 10 reps;3 secs                    PT Education - 05/01/21 0928     Education Details Patient educated on exam findings, POC, scope of PT, HEP    Person(s) Educated Patient    Methods Explanation;Demonstration;Handout    Comprehension Verbalized understanding;Returned demonstration              PT Short Term Goals - 05/01/21 1000       PT SHORT TERM GOAL #1    Title Patient will be independent with HEP in order  to improve functional outcomes.    Time 2    Period Weeks    Status New    Target Date 05/15/21      PT SHORT TERM GOAL #2   Title Patient will report at least 25% improvement in symptoms for improved quality of life.    Time 2    Period Weeks    Status New    Target Date 05/15/21               PT Long Term Goals - 05/01/21 1000       PT LONG TERM GOAL #1   Title Patient will report at least 75% improvement in symptoms for improved quality of life.    Time 4    Period Weeks    Status New    Target Date 05/29/21      PT LONG TERM GOAL #2   Title Patient will demonstrate at least 25% improvement in cervical ROM in all restricted planes for improved ability to move head while completing chores.    Time 4    Period Weeks    Status New    Target Date 05/29/21                    Plan - 05/01/21 0957     Clinical Impression Statement Patient is a 50 y.o. male who presents to physical therapy with c/o cervical radiculopathy. He presents with pain limited deficits in cervical spine strength, ROM, endurance, postural impairments, hyperactive and tender musculature, spinal mobility and functional mobility with ADL. He is having to modify and restrict ADL as indicated by subjective information and objective measures which is affecting overall participation. Patient will benefit from skilled physical therapy in order to improve function and reduce impairment.    Personal Factors and Comorbidities Age;Time since onset of injury/illness/exacerbation;Past/Current Experience;Comorbidity 1;Comorbidity 2;Transportation    Comorbidities bipolar, chronic pain    Examination-Activity Limitations Bend;Caring for Others;Lift;Carry    Examination-Participation Restrictions Meal Prep;Cleaning;Occupation;Shop;Volunteer;Yard Work;Driving    Stability/Clinical Decision Making Stable/Uncomplicated    Clinical Decision Making Low    Rehab  Potential Fair    PT Frequency 2x / week    PT Duration 4 weeks    PT Treatment/Interventions ADLs/Self Care Home Management;Aquatic Therapy;Electrical Stimulation;Iontophoresis 32m/ml Dexamethasone;Moist Heat;Traction;Ultrasound;Parrafin;DME Instruction;Gait training;Stair training;Functional mobility training;Therapeutic activities;Therapeutic exercise;Neuromuscular re-education;Patient/family education;Balance training;Orthotic Fit/Training;Manual techniques;Scar mobilization;Passive range of motion;Dry needling;Energy conservation;Taping;Spinal Manipulations;Joint Manipulations    PT Next Visit Plan f/u with cervical retractions, postural strengthening, possibly manual for pain/mobility    PT Home Exercise Plan cervical retraction supine, scap ret    Consulted and Agree with Plan of Care Patient             Patient will benefit from skilled therapeutic intervention in order to improve the following deficits and impairments:  Decreased range of motion, Impaired UE functional use, Increased muscle spasms, Decreased activity tolerance, Pain, Impaired flexibility, Improper body mechanics, Postural dysfunction, Decreased strength, Decreased mobility  Visit Diagnosis: Cervicalgia  Other symptoms and signs involving the musculoskeletal system     Problem List Patient Active Problem List   Diagnosis Date Noted   Closed fracture of right distal radius and ulna 12/21/2020   Closed fracture of right forearm    Daily headache 11/27/2020   Dizzy spells 11/27/2020   [place] 11/27/2020   Myofascial pain dysfunction syndrome 11/22/2020   Cocaine abuse ([place] 11/18/2020   Foreign body (FB) in soft tissue 09/08/2020   Chronic low back pain 05/11/2020  Shoulder pain 05/11/2020   Headache 05/11/2020   Cervical radiculopathy 02/18/2020   Impingement syndrome of right shoulder 01/18/2020   Rectal pain 06/29/2019   GERD (gastroesophageal reflux disease) 06/29/2019   Rectal bleeding  02/22/2019   Priapism 02/08/2019   Chest pain 10/19/2017   Asthma 10/19/2017   Hypertension 10/19/2017   Arthralgia of right knee 10/19/2017   Pain in limb 12/27/2016   Acute pulmonary embolism ([place]) 10/23/2016   Nausea and vomiting 10/23/2016   S/P IVC filter 10/23/2016   Tobacco abuse counseling 10/23/2016   Chronic deep vein thrombosis (DVT) ([place]) 10/18/2016   Cellulitis of foot 06/24/2011   Bipolar 1 disorder ([place]) 06/24/2011   Tobacco abuse 06/24/2011   Polycythemia 06/24/2011   Tinea pedis 06/24/2011   CARPAL TUNNEL SYNDROME, RIGHT 06/21/2010   CONTUSION, LEFT FOOT 06/05/2010   FOREIGN BODY, FOOT 04/17/2010   10:02 AM, 05/01/21 Mearl Latin PT, DPT Physical Therapist at [facility] [place], [place], [address] Phone: [phone]   Fax:  [phone]  Name: LORING LISKEY MRN: [id number] Date of Birth: 11-23-1970

## 2021-05-08 ENCOUNTER — Other Ambulatory Visit: Payer: Self-pay

## 2021-05-08 ENCOUNTER — Ambulatory Visit (HOSPITAL_COMMUNITY): Payer: Medicaid Other

## 2021-05-08 DIAGNOSIS — M542 Cervicalgia: Secondary | ICD-10-CM | POA: Diagnosis not present

## 2021-05-08 DIAGNOSIS — R29898 Other symptoms and signs involving the musculoskeletal system: Secondary | ICD-10-CM

## 2021-05-08 NOTE — Therapy (Signed)
[facility], [place], [address] Phone: [phone]   Fax:  [phone]  Physical Therapy Treatment  Patient Details  Name: Lance Cohen MRN: [id number] Date of Birth: 1970-11-13 Referring Provider (PT): Sherlyn Lees MD   Encounter Date: 05/08/2021   PT End of Session - 05/08/21 1034     Visit Number 2    Number of Visits 8    Date for PT Re-Evaluation 05/29/21    Authorization Type Medicaid Healthy blue ( 8 visits requested- check auth)    Authorization - Visit Number 2    Authorization - Number of Visits 1    PT Start Time 1034    PT Stop Time 1115    PT Time Calculation (min) 41 min    Activity Tolerance Patient tolerated treatment well    Behavior During Therapy [facility] for tasks assessed/performed             Past Medical History:  Diagnosis Date   Anxiety    Arthritis    Asthma    Bipolar 1 disorder ([place])    Chronic knee pain    Depression    DVT (deep venous thrombosis) ([place])    Hypertension    Insomnia    Sleep apnea    Spinal stenosis    Suicide attempt ([place])    2010 Intentional overdose attempt with Depakote.    Past Surgical History:  Procedure Laterality Date   COLONOSCOPY WITH PROPOFOL N/A 03/18/2019   Procedure: COLONOSCOPY WITH PROPOFOL;  Surgeon: [name], MD;  [facility];  Service: Endoscopy;  Laterality: N/A;  2:30pm   FOOT SURGERY     had peice of wire removed   IVC FILTER REMOVAL N/A 02/24/2017   Procedure: IVC Filter Removal;  Surgeon: [name], MD;  Location: [facility];  Service: Cardiovascular;  Laterality: N/A;   ORIF RADIAL FRACTURE Right 12/05/2020   Procedure: OPEN REDUCTION INTERNAL FIXATION (ORIF) RADIAL FRACTURE and ULNA FRACTURE;  Surgeon: Carole Civil, MD;  [facility];  Service: Orthopedics;  Laterality: Right;   PERIPHERAL VASCULAR CATHETERIZATION Right 10/22/2016   Procedure: Thrombectomy, thrombolyisis;  Surgeon: [name], MD;  Location: [facility];  Service: Cardiovascular;  Laterality: Right;   PERIPHERAL VASCULAR CATHETERIZATION N/A 10/22/2016   Procedure: IVC Filter Insertion;  Surgeon: [name], MD;  Location: [facility];  Service: Cardiovascular;  Laterality: N/A;   POLYPECTOMY  03/18/2019   Procedure: POLYPECTOMY;  Surgeon: [name], MD;  [facility];  Service: Endoscopy;;    There were no vitals filed for this visit.   Subjective Assessment - 05/08/21 1036     Subjective "Hurts from the top of my head through my shoulders. Feels like pins shooting up and down."    Limitations House hold activities;Lifting    Patient Stated Goals decrease headaches    Currently in Pain? Yes    Pain Score 9     Pain Location Neck    Pain Orientation Mid;Lower    Pain Descriptors / Indicators Aching;Pins and needles    Pain Type Chronic pain    Pain Onset More than a month ago                               [facility] Adult PT Treatment/Exercise - 05/08/21 0001       Neck  Exercises: Seated   Neck Retraction 10 reps;3 secs    Other Seated Exercise scap retractions 1x 10    Other Seated Exercise extension SNAG 10x3 sec hold      Neck Exercises: Supine   Neck Retraction 15 reps;3 secs    Capital Flexion 10 reps;3 secs                    PT Education - 05/08/21 1103     Education Details education on benefits of postural re-education and idea of consistent performance and mindfullness for head/neck position to minimize postural stress/pain    Person(s) Educated Patient    Methods Explanation;Demonstration;Handout    Comprehension Verbalized understanding;Returned demonstration              PT Short Term Goals - 05/01/21 1000       PT SHORT TERM GOAL #1   Title Patient will be independent with HEP in order to improve functional outcomes.    Time 2    Period Weeks    Status New    Target Date 05/15/21      PT  SHORT TERM GOAL #2   Title Patient will report at least 25% improvement in symptoms for improved quality of life.    Time 2    Period Weeks    Status New    Target Date 05/15/21               PT Long Term Goals - 05/01/21 1000       PT LONG TERM GOAL #1   Title Patient will report at least 75% improvement in symptoms for improved quality of life.    Time 4    Period Weeks    Status New    Target Date 05/29/21      PT LONG TERM GOAL #2   Title Patient will demonstrate at least 25% improvement in cervical ROM in all restricted planes for improved ability to move head while completing chores.    Time 4    Period Weeks    Status New    Target Date 05/29/21                   Plan - 05/08/21 1104     Clinical Impression Statement Visual assessment reveals pt sitting with very poor, slumped posture and maintaining head/neck in pronounced forward flexion.  Able to proceed with postural activities initiated in supine without difficulty and increased pain. Progressed to sitting with cues for good posture and able to achieve good alignment with neutral c-spine via chin retraction and verbal/visual cues. Explained rationale for HEP performance to perform activities for endurance to promote greater efficiency and progressing for improve posture without cues.    Personal Factors and Comorbidities Age;Time since onset of injury/illness/exacerbation;Past/Current Experience;Comorbidity 1;Comorbidity 2;Transportation    Comorbidities bipolar, chronic pain    Examination-Activity Limitations Bend;Caring for Others;Lift;Carry    Examination-Participation Restrictions Meal Prep;Cleaning;Occupation;Shop;Volunteer;Yard Work;Driving    Stability/Clinical Decision Making Stable/Uncomplicated    Rehab Potential Fair    PT Frequency 2x / week    PT Duration 4 weeks    PT Treatment/Interventions ADLs/Self Care Home Management;Aquatic Therapy;Electrical Stimulation;Iontophoresis 3m/ml  Dexamethasone;Moist Heat;Traction;Ultrasound;Parrafin;DME Instruction;Gait training;Stair training;Functional mobility training;Therapeutic activities;Therapeutic exercise;Neuromuscular re-education;Patient/family education;Balance training;Orthotic Fit/Training;Manual techniques;Scar mobilization;Passive range of motion;Dry needling;Energy conservation;Taping;Spinal Manipulations;Joint Manipulations    PT Next Visit Plan f/u with cervical retractions, postural strengthening, possibly manual for pain/mobility    PT Home Exercise Plan cervical retraction supine, scap ret, supine  neck flexion, seated retractions, extension SNAG    Consulted and Agree with Plan of Care Patient             Patient will benefit from skilled therapeutic intervention in order to improve the following deficits and impairments:  Decreased range of motion, Impaired UE functional use, Increased muscle spasms, Decreased activity tolerance, Pain, Impaired flexibility, Improper body mechanics, Postural dysfunction, Decreased strength, Decreased mobility  Visit Diagnosis: Cervicalgia  Other symptoms and signs involving the musculoskeletal system     Problem List Patient Active Problem List   Diagnosis Date Noted   Closed fracture of right distal radius and ulna 12/21/2020   Closed fracture of right forearm    Daily headache 11/27/2020   Dizzy spells 11/27/2020   Seizure ([place]) 11/27/2020   Myofascial pain dysfunction syndrome 11/22/2020   Cocaine abuse ([place]) 11/18/2020   Foreign body (FB) in soft tissue 09/08/2020   Chronic low back pain 05/11/2020   Shoulder pain 05/11/2020   Headache 05/11/2020   Cervical radiculopathy 02/18/2020   Impingement syndrome of right shoulder 01/18/2020   Rectal pain 06/29/2019   GERD (gastroesophageal reflux disease) 06/29/2019   Rectal bleeding 02/22/2019   Priapism 02/08/2019   Chest pain 10/19/2017   Asthma 10/19/2017   Hypertension 10/19/2017   Arthralgia of right knee  10/19/2017   Pain in limb 12/27/2016   Acute pulmonary embolism ([facility]) 10/23/2016   Nausea and vomiting 10/23/2016   S/P IVC filter 10/23/2016   Tobacco abuse counseling 10/23/2016   Chronic deep vein thrombosis (DVT) ([place]) 10/18/2016   Cellulitis of foot 06/24/2011   Bipolar 1 disorder ([place]) 06/24/2011   Tobacco abuse 06/24/2011   Polycythemia 06/24/2011   Tinea pedis 06/24/2011   CARPAL TUNNEL SYNDROME, RIGHT 06/21/2010   CONTUSION, LEFT FOOT 06/05/2010   FOREIGN BODY, FOOT 04/17/2010   11:07 AM, 05/08/21 M. Sherlyn Lees, PT, DPT Physical Therapist- [facility] Office Number: [phone]   [facility] [address] [place], [place], [address] Phone: [phone]   Fax:  [phone]  Name: Lance Cohen MRN: [id number] Date of Birth: Aug 08, 1971

## 2021-05-08 NOTE — Patient Instructions (Signed)
Access Code: [email or web address] URL: [email or web address] Date: 05/08/2021 Prepared by: Sherlyn Lees  Exercises Supine Deep Neck Flexor Training - Repetitions - 1 x daily - 7 x weekly - 3 sets - 10 reps - 3 sec hold Seated Cervical Retraction - 1 x daily - 7 x weekly - 5-10 reps - 3 hold Mid-Lower Cervical Extension SNAG with Strap - 1 x daily - 7 x weekly - 3 sets - 10 reps - 3 sec hold

## 2021-05-10 ENCOUNTER — Telehealth (HOSPITAL_COMMUNITY): Payer: Self-pay | Admitting: Physical Therapy

## 2021-05-10 ENCOUNTER — Ambulatory Visit (HOSPITAL_COMMUNITY): Payer: Medicaid Other | Admitting: Physical Therapy

## 2021-05-10 NOTE — Telephone Encounter (Signed)
Pt did not show.  Called and voicemail was full.  Pt then called back and reported "I thought my old lady had called and cancelled".  Reminded of next appt on Tuesday.  [name], PTA/CLT [phone]

## 2021-05-15 ENCOUNTER — Other Ambulatory Visit: Payer: Self-pay

## 2021-05-15 ENCOUNTER — Ambulatory Visit (HOSPITAL_COMMUNITY): Payer: Medicaid Other | Admitting: Physical Therapy

## 2021-05-15 DIAGNOSIS — R29898 Other symptoms and signs involving the musculoskeletal system: Secondary | ICD-10-CM

## 2021-05-15 DIAGNOSIS — M542 Cervicalgia: Secondary | ICD-10-CM | POA: Diagnosis not present

## 2021-05-15 NOTE — Therapy (Deleted)
[place], [place], [address] Phone: [phone]   Fax:  [phone]  Physical Therapy Treatment  Patient Details  Name: [name] MRN: [id number] Date of Birth: 1971/03/16 Referring Provider (PT): Sherlyn Lees MD   Encounter Date: 05/15/2021   PT End of Session - 05/15/21 1013     Visit Number 3    Number of Visits 8    Date for PT Re-Evaluation 05/29/21    Authorization Type Medicaid Healthy blue ( 8 visits requested- check auth)    Authorization - Visit Number 3    Authorization - Number of Visits 8    PT Start Time [id number]    PT Stop Time 1040    PT Time Calculation (min) 38 min    Activity Tolerance Patient tolerated treatment well    Behavior During Therapy [facility] for tasks assessed/performed             Past Medical History:  Diagnosis Date   Anxiety    Arthritis    Asthma    Bipolar 1 disorder ([place])    Chronic knee pain    Depression    DVT (deep venous thrombosis) ([place])    Hypertension    Insomnia    Sleep apnea    Spinal stenosis    Suicide attempt ([place])    2010 Intentional overdose attempt with Depakote.    Past Surgical History:  Procedure Laterality Date   COLONOSCOPY WITH PROPOFOL N/A 03/18/2019   Procedure: COLONOSCOPY WITH PROPOFOL;  Surgeon: [name], MD;  [facility];  Service: Endoscopy;  Laterality: N/A;  2:30pm   FOOT SURGERY     had peice of wire removed   IVC FILTER REMOVAL N/A 02/24/2017   Procedure: IVC Filter Removal;  Surgeon: [name], MD;  Location: [place] [facility];  Service: Cardiovascular;  Laterality: N/A;   ORIF RADIAL FRACTURE Right 12/05/2020   Procedure: OPEN REDUCTION INTERNAL FIXATION (ORIF) RADIAL FRACTURE and ULNA FRACTURE;  Surgeon: Carole Civil, MD;  Location: [facility];  Service: Orthopedics;  Laterality: Right;   PERIPHERAL VASCULAR CATHETERIZATION Right 10/22/2016   Procedure: Thrombectomy, thrombolyisis;  Surgeon: [name], MD;  Location: [place] [facility];  Service: Cardiovascular;  Laterality: Right;   PERIPHERAL VASCULAR CATHETERIZATION N/A 10/22/2016   Procedure: IVC Filter Insertion;  Surgeon: [name], MD;  Location: [place] [facility];  Service: Cardiovascular;  Laterality: N/A;   POLYPECTOMY  03/18/2019   Procedure: POLYPECTOMY;  Surgeon: [name], MD;  [facility];  Service: Endoscopy;;    There were no vitals filed for this visit.   Subjective Assessment - 05/15/21 1008     Subjective Pt states he's been trying to do his HEP.  Pain is still on the Lt side of his neck at 10/10.  Denies need to go to ED due to high pain levels.    Currently in Pain? Yes    Pain Score 10-Worst pain ever                               [facility] Adult PT Treatment/Exercise - 05/15/21 0001       Neck Exercises: Standing   Upper Extremity Flexion with Stabilization 10 reps    Other Standing Exercises upright posturing against the wall standing tall 10 seconds X 10 reps      Neck Exercises:  Seated   Neck Retraction 10 reps;3 secs    X to V 10 reps    W Back 10 reps    Shoulder Rolls 10 reps                    PT Education - 05/15/21 1022     Education Details importance of posture with tendency of patient to keep head/neck forward flexed.  Encouraged to focus on keeping in neutral to minimize postural stress and pain.    Person(s) Educated Patient    Methods Explanation    Comprehension Verbalized understanding              PT Short Term Goals - 05/01/21 1000       PT SHORT TERM GOAL #1   Title Patient will be independent with HEP in order to improve functional outcomes.    Time 2    Period Weeks    Status New    Target Date 05/15/21      PT SHORT TERM GOAL #2   Title Patient will report at least 25% improvement in symptoms for improved quality of life.    Time 2    Period Weeks    Status New    Target Date 05/15/21                PT Long Term Goals - 05/01/21 1000       PT LONG TERM GOAL #1   Title Patient will report at least 75% improvement in symptoms for improved quality of life.    Time 4    Period Weeks    Status New    Target Date 05/29/21      PT LONG TERM GOAL #2   Title Patient will demonstrate at least 25% improvement in cervical ROM in all restricted planes for improved ability to move head while completing chores.    Time 4    Period Weeks    Status New    Target Date 05/29/21                    Patient will benefit from skilled therapeutic intervention in order to improve the following deficits and impairments:     Visit Diagnosis: Cervicalgia  Other symptoms and signs involving the musculoskeletal system     Problem List Patient Active Problem List   Diagnosis Date Noted   Closed fracture of right distal radius and ulna 12/21/2020   Closed fracture of right forearm    Daily headache 11/27/2020   Dizzy spells 11/27/2020   Seizure ([place]) 11/27/2020   Myofascial pain dysfunction syndrome 11/22/2020   Cocaine abuse ([place]) 11/18/2020   Foreign body (FB) in soft tissue 09/08/2020   Chronic low back pain 05/11/2020   Shoulder pain 05/11/2020   Headache 05/11/2020   Cervical radiculopathy 02/18/2020   Impingement syndrome of right shoulder 01/18/2020   Rectal pain 06/29/2019   GERD (gastroesophageal reflux disease) 06/29/2019   Rectal bleeding 02/22/2019   Priapism 02/08/2019   Chest pain 10/19/2017   Asthma 10/19/2017   Hypertension 10/19/2017   Arthralgia of right knee 10/19/2017   Pain in limb 12/27/2016   Acute pulmonary embolism ([place]) 10/23/2016   Nausea and vomiting 10/23/2016   S/P IVC filter 10/23/2016   Tobacco abuse counseling 10/23/2016   Chronic deep vein thrombosis (DVT) ([place]) 10/18/2016   Cellulitis of foot 06/24/2011   Bipolar 1 disorder ([place]) 06/24/2011   Tobacco abuse 06/24/2011   Polycythemia 06/24/2011  Tinea pedis 06/24/2011    CARPAL TUNNEL SYNDROME, RIGHT 06/21/2010   CONTUSION, LEFT FOOT 06/05/2010   FOREIGN BODY, FOOT 04/17/2010    [name] 05/15/2021, 10:25 AM  [facility] [address] [place], [place], [address] Phone: [phone]   Fax:  [phone]  Name: Lance Cohen MRN: [id number] Date of Birth: 01/02/1971

## 2021-05-15 NOTE — Therapy (Signed)
[place], [place], [address] Phone: [phone]   Fax:  [phone]  Physical Therapy Treatment  Patient Details  Name: Lance Cohen MRN: [id number] Date of Birth: 12/26/1970 Referring Provider (PT): Sherlyn Lees MD   Encounter Date: 05/15/2021   PT End of Session - 05/15/21 1013     Visit Number 3    Number of Visits 8    Date for PT Re-Evaluation 05/29/21    Authorization Type Medicaid [employer] Blue Approved PT 7/12-8/06/2021 2x/week for 4 weeks    Authorization - Visit Number 3    Authorization - Number of Visits 8    PT Start Time 1027    PT Stop Time 1040    PT Time Calculation (min) 38 min    Activity Tolerance Patient tolerated treatment well    Behavior During Therapy [facility] for tasks assessed/performed             Past Medical History:  Diagnosis Date   Anxiety    Arthritis    Asthma    Bipolar 1 disorder ([place])    Chronic knee pain    Depression    DVT (deep venous thrombosis) ([place])    Hypertension    Insomnia    Sleep apnea    Spinal stenosis    Suicide attempt ([place])    2010 Intentional overdose attempt with Depakote.    Past Surgical History:  Procedure Laterality Date   COLONOSCOPY WITH PROPOFOL N/A 03/18/2019   Procedure: COLONOSCOPY WITH PROPOFOL;  Surgeon: [name], MD;  [facility];  Service: Endoscopy;  Laterality: N/A;  2:30pm   FOOT SURGERY     had peice of wire removed   IVC FILTER REMOVAL N/A 02/24/2017   Procedure: IVC Filter Removal;  Surgeon: [name], MD;  Location: [place] [facility];  Service: Cardiovascular;  Laterality: N/A;   ORIF RADIAL FRACTURE Right 12/05/2020   Procedure: OPEN REDUCTION INTERNAL FIXATION (ORIF) RADIAL FRACTURE and ULNA FRACTURE;  Surgeon: Carole Civil, MD;  [facility];  Service: Orthopedics;  Laterality: Right;   PERIPHERAL VASCULAR CATHETERIZATION Right 10/22/2016   Procedure: Thrombectomy, thrombolyisis;   Surgeon: [name], MD;  Location: [place] [facility];  Service: Cardiovascular;  Laterality: Right;   PERIPHERAL VASCULAR CATHETERIZATION N/A 10/22/2016   Procedure: IVC Filter Insertion;  Surgeon: [name], MD;  Location: [place] [facility];  Service: Cardiovascular;  Laterality: N/A;   POLYPECTOMY  03/18/2019   Procedure: POLYPECTOMY;  Surgeon: [name], MD;  [facility];  Service: Endoscopy;;    There were no vitals filed for this visit.   Subjective Assessment - 05/15/21 1008     Subjective Pt states he's been trying to do his HEP.  Pain is still on the Lt side of his neck at 10/10.  Denies need to go to ED due to high pain levels.    Currently in Pain? Yes    Pain Score 10-Worst pain ever                               [facility] Adult PT Treatment/Exercise - 05/15/21 0001       Neck Exercises: Standing   Upper Extremity Flexion with Stabilization 10 reps    Other Standing Exercises upright posturing against the wall standing tall 10 seconds X 10 reps      Neck  Exercises: Seated   Neck Retraction 10 reps;3 secs    X to V 10 reps    W Back 10 reps    Shoulder Rolls 10 reps      Manual Therapy   Manual Therapy Soft tissue mobilization    Manual therapy comments completed seperately from all other skilled interventions    Soft tissue mobilization seated to cervical and Upper traps Rt>Lt                    PT Education - 05/15/21 1022     Education Details importance of posture with tendency of patient to keep head/neck forward flexed.  Encouraged to focus on keeping in neutral to minimize postural stress and pain.    Person(s) Educated Patient    Methods Explanation    Comprehension Verbalized understanding              PT Short Term Goals - 05/01/21 1000       PT SHORT TERM GOAL #1   Title Patient will be independent with HEP in order to improve functional outcomes.    Time 2    Period Weeks     Status New    Target Date 05/15/21      PT SHORT TERM GOAL #2   Title Patient will report at least 25% improvement in symptoms for improved quality of life.    Time 2    Period Weeks    Status New    Target Date 05/15/21               PT Long Term Goals - 05/01/21 1000       PT LONG TERM GOAL #1   Title Patient will report at least 75% improvement in symptoms for improved quality of life.    Time 4    Period Weeks    Status New    Target Date 05/29/21      PT LONG TERM GOAL #2   Title Patient will demonstrate at least 25% improvement in cervical ROM in all restricted planes for improved ability to move head while completing chores.    Time 4    Period Weeks    Status New    Target Date 05/29/21                   Plan - 05/15/21 1156     Clinical Impression Statement Began session with standing stretches and UE flexion to encourage upright posturing.  Educated on posture as tends to present with forward bent/forward head.   Encouraged to focus on keeping in neutral to minimize postural stress and pain.  Manual completed at EOS to cervical and bil UT.  Rt UT with several spasms/tight areas with none in comparison to Lt UT.  Pt reported overall improvement with reduction in pain to 5/10 at end of session.    Personal Factors and Comorbidities Age;Time since onset of injury/illness/exacerbation;Past/Current Experience;Comorbidity 1;Comorbidity 2;Transportation    Comorbidities bipolar, chronic pain    Examination-Activity Limitations Bend;Caring for Others;Lift;Carry    Examination-Participation Restrictions Meal Prep;Cleaning;Occupation;Shop;Volunteer;Yard Work;Driving    Stability/Clinical Decision Making Stable/Uncomplicated    Rehab Potential Fair    PT Frequency 2x / week    PT Duration 4 weeks    PT Treatment/Interventions ADLs/Self Care Home Management;Aquatic Therapy;Electrical Stimulation;Iontophoresis 63m/ml Dexamethasone;Moist  Heat;Traction;Ultrasound;Parrafin;DME Instruction;Gait training;Stair training;Functional mobility training;Therapeutic activities;Therapeutic exercise;Neuromuscular re-education;Patient/family education;Balance training;Orthotic Fit/Training;Manual techniques;Scar mobilization;Passive range of motion;Dry needling;Energy conservation;Taping;Spinal Manipulations;Joint Manipulations    PT Next  Visit Plan f/u with cervical retractions, postural strengthening, possibly manual for pain/mobility    PT Home Exercise Plan cervical retraction supine, scap ret, supine neck flexion, seated retractions, extension SNAG    Consulted and Agree with Plan of Care Patient             Patient will benefit from skilled therapeutic intervention in order to improve the following deficits and impairments:  Decreased range of motion, Impaired UE functional use, Increased muscle spasms, Decreased activity tolerance, Pain, Impaired flexibility, Improper body mechanics, Postural dysfunction, Decreased strength, Decreased mobility  Visit Diagnosis: Cervicalgia  Other symptoms and signs involving the musculoskeletal system     Problem List Patient Active Problem List   Diagnosis Date Noted   Closed fracture of right distal radius and ulna 12/21/2020   Closed fracture of right forearm    Daily headache 11/27/2020   Dizzy spells 11/27/2020   Seizure ([place]) 11/27/2020   Myofascial pain dysfunction syndrome 11/22/2020   Cocaine abuse ([place]) 11/18/2020   Foreign body (FB) in soft tissue 09/08/2020   Chronic low back pain 05/11/2020   Shoulder pain 05/11/2020   Headache 05/11/2020   Cervical radiculopathy 02/18/2020   Impingement syndrome of right shoulder 01/18/2020   Rectal pain 06/29/2019   GERD (gastroesophageal reflux disease) 06/29/2019   Rectal bleeding 02/22/2019   Priapism 02/08/2019   Chest pain 10/19/2017   Asthma 10/19/2017   Hypertension 10/19/2017   Arthralgia of right knee 10/19/2017   Pain in  limb 12/27/2016   Acute pulmonary embolism ([facility]) 10/23/2016   Nausea and vomiting 10/23/2016   S/P IVC filter 10/23/2016   Tobacco abuse counseling 10/23/2016   Chronic deep vein thrombosis (DVT) ([place]) 10/18/2016   Cellulitis of foot 06/24/2011   Bipolar 1 disorder ([place]) 06/24/2011   Tobacco abuse 06/24/2011   Polycythemia 06/24/2011   Tinea pedis 06/24/2011   CARPAL TUNNEL SYNDROME, RIGHT 06/21/2010   CONTUSION, LEFT FOOT 06/05/2010   FOREIGN BODY, FOOT 04/17/2010   Lance Cohen, PTA/CLT [phone]  Lance Cohen 05/15/2021, 11:56 AM  [place], [place], [address] Phone: [phone]   Fax:  [phone]  Name: Lance Cohen MRN: [id number] Date of Birth: 03-03-1971

## 2021-05-17 ENCOUNTER — Encounter (HOSPITAL_COMMUNITY): Payer: Medicaid Other | Admitting: Physical Therapy

## 2021-05-22 ENCOUNTER — Ambulatory Visit (HOSPITAL_COMMUNITY): Payer: Medicaid Other | Admitting: Physical Therapy

## 2021-05-24 ENCOUNTER — Other Ambulatory Visit: Payer: Self-pay

## 2021-05-24 ENCOUNTER — Encounter: Payer: Self-pay | Admitting: Orthopedic Surgery

## 2021-05-24 ENCOUNTER — Ambulatory Visit (INDEPENDENT_AMBULATORY_CARE_PROVIDER_SITE_OTHER): Payer: Medicaid Other | Admitting: Orthopedic Surgery

## 2021-05-24 ENCOUNTER — Ambulatory Visit: Payer: Medicaid Other

## 2021-05-24 ENCOUNTER — Ambulatory Visit (HOSPITAL_COMMUNITY): Payer: Medicaid Other

## 2021-05-24 VITALS — Resp 16 | Ht 71.0 in | Wt 215.0 lb

## 2021-05-24 DIAGNOSIS — S80251D Superficial foreign body, right knee, subsequent encounter: Secondary | ICD-10-CM | POA: Diagnosis not present

## 2021-05-24 DIAGNOSIS — S80251S Superficial foreign body, right knee, sequela: Secondary | ICD-10-CM
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
## 2021-05-29 ENCOUNTER — Encounter (HOSPITAL_COMMUNITY): Payer: Self-pay | Admitting: Physical Therapy

## 2021-05-29 ENCOUNTER — Ambulatory Visit (HOSPITAL_COMMUNITY): Payer: Medicaid Other | Attending: Pain Medicine | Admitting: Physical Therapy

## 2021-05-29 ENCOUNTER — Other Ambulatory Visit: Payer: Self-pay

## 2021-05-29 DIAGNOSIS — R29898 Other symptoms and signs involving the musculoskeletal system: Secondary | ICD-10-CM | POA: Diagnosis present

## 2021-05-29 DIAGNOSIS — M542 Cervicalgia: Secondary | ICD-10-CM

## 2021-05-29 NOTE — Therapy (Addendum)
[facility] [address] [place], [place], [address] Phone: [phone]   Fax:  [phone]  Physical Therapy Treatment  Patient Details  Name: Lance Cohen MRN: [id number] Date of Birth: Aug 04, 1971 Referring Provider (PT): Sherlyn Lees MD  Progress Note Reporting Period 05/01/21 to 05/29/21  See note below for Objective Data and Assessment of Progress/Goals.      Encounter Date: 05/29/2021   PT End of Session - 05/29/21 1029     Visit Number 4    Number of Visits 12    Date for PT Re-Evaluation 06/26/21    Authorization Type Medicaid [employer] Approved PT 7/12-8/06/2021 2x/week for 4 weeks (submitted 8 more 05/29/21)    Authorization Time Period check auth    Authorization - Visit Number 4    Authorization - Number of Visits 8    PT Start Time 1030    PT Stop Time 1110    PT Time Calculation (min) 40 min    Activity Tolerance Patient limited by pain    Behavior During Therapy WFL for tasks assessed/performed             Past Medical History:  Diagnosis Date   Anxiety    Arthritis    Asthma    Bipolar 1 disorder ([place])    Chronic knee pain    Depression    DVT (deep venous thrombosis) ([place])    Hypertension    Insomnia    Sleep apnea    Spinal stenosis    Suicide attempt ([place])    2010 Intentional overdose attempt with Depakote.    Past Surgical History:  Procedure Laterality Date   COLONOSCOPY WITH PROPOFOL N/A 03/18/2019   Procedure: COLONOSCOPY WITH PROPOFOL;  Surgeon: [name], MD;  [facility];  Service: Endoscopy;  Laterality: N/A;  2:30pm   FOOT SURGERY     had peice of wire removed   IVC FILTER REMOVAL N/A 02/24/2017   Procedure: IVC Filter Removal;  Surgeon: [name], MD;  Location: [facility];  Service: Cardiovascular;  Laterality: N/A;   ORIF RADIAL FRACTURE Right 12/05/2020   Procedure: OPEN REDUCTION INTERNAL FIXATION (ORIF) RADIAL FRACTURE and ULNA FRACTURE;  Surgeon:  Carole Civil, MD;  [facility]: [facility];  Service: Orthopedics;  Laterality: Right;   PERIPHERAL VASCULAR CATHETERIZATION Right 10/22/2016   Procedure: Thrombectomy, thrombolyisis;  Surgeon: [name], MD;  Location: [facility];  Service: Cardiovascular;  Laterality: Right;   PERIPHERAL VASCULAR CATHETERIZATION N/A 10/22/2016   Procedure: IVC Filter Insertion;  Surgeon: [name], MD;  Location: [facility];  Service: Cardiovascular;  Laterality: N/A;   POLYPECTOMY  03/18/2019   Procedure: POLYPECTOMY;  Surgeon: [name], MD;  [facility];  Service: Endoscopy;;    There were no vitals filed for this visit.   Subjective Assessment - 05/29/21 1038     Subjective Patient says he feels about the same. He says exercises have helped a little but still having increased neck pain. He states about 50% improvement.    Limitations House hold activities;Lifting    Currently in Pain? Yes    Pain Score 8     Pain Location Neck    Pain Orientation Posterior    Pain Descriptors / Indicators Aching;Burning;Pins and needles    Pain Type Chronic pain    Pain Onset More than a month ago    Pain Frequency Constant  Effect of Pain on Daily Activities Limits                [facility] PT Assessment - 05/29/21 0001       Assessment   Medical Diagnosis Cervical Radiculopathy    Referring Provider (PT) Sherlyn Lees MD    Onset Date/Surgical Date 05/01/18      Prior Function   Level of Independence Independent    Vocation On disability      Cognition   Overall Cognitive Status Within Functional Limits for tasks assessed      Posture/Postural Control   Posture/Postural Control Postural limitations    Postural Limitations Rounded Shoulders;Forward head   slouched seated posture     AROM   Cervical Flexion WNL    Cervical Extension 25% limited (increased pain)   was 50% limited   Cervical - Right Side Bend 25% limited   was 50% limited   Cervical  - Left Side Bend 25% limited    Cervical - Right Rotation 0% limited    Cervical - Left Rotation 0% limited                           [facility] Adult PT Treatment/Exercise - 05/29/21 0001       Neck Exercises: Theraband   Shoulder Extension 10 reps;Green    Rows 10 reps;Green      Neck Exercises: Seated   Neck Retraction 10 reps;3 secs    W Back 10 reps    Shoulder Rolls 10 reps    Other Seated Exercise scap retractions 10 x 3"      Neck Exercises: Stretches   Upper Trapezius Stretch Right;Left;2 reps;30 seconds                      PT Short Term Goals - 05/29/21 1102       PT SHORT TERM GOAL #1   Title Patient will be independent with HEP in order to improve functional outcomes.    Baseline Reports varied compliance    Time 2    Period Weeks    Status Partially Met    Target Date 05/15/21      PT SHORT TERM GOAL #2   Title Patient will report at least 25% improvement in symptoms for improved quality of life.    Baseline Reports 50%    Time 2    Period Weeks    Status Achieved    Target Date 05/15/21               PT Long Term Goals - 05/29/21 1102       PT LONG TERM GOAL #1   Title Patient will report at least 75% improvement in symptoms for improved quality of life.    Baseline Reports 50%    Time 4    Period Weeks    Status On-going      PT LONG TERM GOAL #2   Title Patient will demonstrate at least 25% improvement in cervical ROM in all restricted planes for improved ability to move head while completing chores.    Baseline See AROM    Time 4    Period Weeks    Status Partially Met                   Plan - 05/29/21 1059     Clinical Impression Statement Patient demos moderate progress toward therapy goals. Patient remains pain limited  and reports continued elevated pain level at baseline. Patient does show improved cervical mobility and reports moderate subjective improvement. Patient will continue to benefit  from skilled therapy services to address remaining deficits for reduced pain and improved functional ability.    Personal Factors and Comorbidities Age;Time since onset of injury/illness/exacerbation;Past/Current Experience;Comorbidity 1;Comorbidity 2;Transportation    Comorbidities bipolar, chronic pain    Examination-Activity Limitations Bend;Caring for Others;Lift;Carry    Examination-Participation Restrictions Meal Prep;Cleaning;Occupation;Shop;Volunteer;Yard Work;Driving    Stability/Clinical Decision Making Stable/Uncomplicated    Rehab Potential Fair    PT Frequency 2x / week    PT Duration 4 weeks    PT Treatment/Interventions ADLs/Self Care Home Management;Aquatic Therapy;Electrical Stimulation;Iontophoresis 85m/ml Dexamethasone;Moist Heat;Traction;Ultrasound;Parrafin;DME Instruction;Gait training;Stair training;Functional mobility training;Therapeutic activities;Therapeutic exercise;Neuromuscular re-education;Patient/family education;Balance training;Orthotic Fit/Training;Manual techniques;Scar mobilization;Passive range of motion;Dry needling;Energy conservation;Taping;Spinal Manipulations;Joint Manipulations    PT Next Visit Plan Conitnue to progress postural strength and cervical mobility as tolerated. Manual as needed.    PT Home Exercise Plan cervical retraction supine, scap ret, supine neck flexion, seated retractions, extension SNAG    Consulted and Agree with Plan of Care Patient             Patient will benefit from skilled therapeutic intervention in order to improve the following deficits and impairments:  Decreased range of motion, Impaired UE functional use, Increased muscle spasms, Decreased activity tolerance, Pain, Impaired flexibility, Improper body mechanics, Postural dysfunction, Decreased strength, Decreased mobility  Visit Diagnosis: Cervicalgia - Plan: PT plan of care cert/re-cert     Problem List Patient Active Problem List   Diagnosis Date Noted    Closed fracture of right distal radius and ulna 12/21/2020   Closed fracture of right forearm    Daily headache 11/27/2020   Dizzy spells 11/27/2020   Seizure ([place] 11/27/2020   Myofascial pain dysfunction syndrome 11/22/2020   Cocaine abuse ([place] 11/18/2020   Foreign body (FB) in soft tissue 09/08/2020   Chronic low back pain 05/11/2020   Shoulder pain 05/11/2020   Headache 05/11/2020   Cervical radiculopathy 02/18/2020   Impingement syndrome of right shoulder 01/18/2020   Rectal pain 06/29/2019   GERD (gastroesophageal reflux disease) 06/29/2019   Rectal bleeding 02/22/2019   Priapism 02/08/2019   Chest pain 10/19/2017   Asthma 10/19/2017   Hypertension 10/19/2017   Arthralgia of right knee 10/19/2017   Pain in limb 12/27/2016   Acute pulmonary embolism (HCC) 10/23/2016   Nausea and vomiting 10/23/2016   S/P IVC filter 10/23/2016   Tobacco abuse counseling 10/23/2016   Chronic deep [place] ([place]T) ([place] 10/18/2016   Cellulitis of foot 06/24/2011   Bipolar 1 disorder ([place] 06/24/2011   Tobacco abuse 06/24/2011   Polycythemia 06/24/2011   Tinea pedis 06/24/2011   CARPAL TUNNEL SYNDROME, RIGHT 06/21/2010   CONTUSION, LEFT FOOT 06/05/2010   FOREIGN BODY, FOOT 04/17/2010   11:19 AM, 05/29/21 CJosue HectorPT DPT  [facility] [phone] [address] [address]Phone: [phone]  Fax:  [phone] Name: Lance REHAMRN: [id number] of Birth: 5May 02, 1972

## 2021-05-31 ENCOUNTER — Ambulatory Visit (HOSPITAL_COMMUNITY): Payer: Medicaid Other | Admitting: Physical Therapy

## 2021-05-31 ENCOUNTER — Telehealth (HOSPITAL_COMMUNITY): Payer: Self-pay | Admitting: Physical Therapy

## 2021-05-31 NOTE — Telephone Encounter (Signed)
Called patient about missed appointment today. Patient sates he forgot about appointment and did not have schedule. Gave reminder of upcoming visit on 8/17.   11:48 AM, 05/31/21 [name] PT DPT  Physical Therapist with [facility]  [phone]

## 2021-06-04 ENCOUNTER — Ambulatory Visit: Payer: Medicaid Other | Admitting: Orthopedic Surgery

## 2021-06-06 ENCOUNTER — Other Ambulatory Visit: Payer: Self-pay

## 2021-06-06 ENCOUNTER — Encounter (HOSPITAL_COMMUNITY): Payer: Self-pay | Admitting: Physical Therapy

## 2021-06-06 ENCOUNTER — Ambulatory Visit (HOSPITAL_COMMUNITY): Payer: Medicaid Other | Admitting: Physical Therapy

## 2021-06-06 DIAGNOSIS — M542 Cervicalgia: Secondary | ICD-10-CM

## 2021-06-06 DIAGNOSIS — R29898 Other symptoms and signs involving the musculoskeletal system: Secondary | ICD-10-CM

## 2021-06-06 NOTE — Therapy (Signed)
[place], [place], [address] Phone: [phone]   Fax:  [phone]  Physical Therapy Treatment  Patient Details  Name: Lance Cohen MRN: [id number] Date of Birth: 08-21-1971 Referring Provider (PT): Sherlyn Lees MD   Encounter Date: 06/06/2021   PT End of Session - 06/06/21 0806     Visit Number 5    Number of Visits 12    Date for PT Re-Evaluation 06/26/21    Authorization Type Medicaid Healthy Blue Approved PT 7/12-8/06/2021 2x/week for 4 weeks (submitted 8 more 05/29/21)    Authorization Time Period check auth    Authorization - Visit Number 5    Authorization - Number of Visits 8    PT Start Time 0805    PT Stop Time 0845    PT Time Calculation (min) 40 min    Activity Tolerance Patient limited by pain    Behavior During Therapy [facility] for tasks assessed/performed             Past Medical History:  Diagnosis Date   Anxiety    Arthritis    Asthma    Bipolar 1 disorder ([place])    Chronic knee pain    Depression    DVT (deep venous thrombosis) ([place])    Hypertension    Insomnia    Sleep apnea    Spinal stenosis    Suicide attempt ([place])    2010 Intentional overdose attempt with Depakote.    Past Surgical History:  Procedure Laterality Date   COLONOSCOPY WITH PROPOFOL N/A 03/18/2019   Procedure: COLONOSCOPY WITH PROPOFOL;  Surgeon: [name], MD;  [facility];  Service: Endoscopy;  Laterality: N/A;  2:30pm   FOOT SURGERY     had peice of wire removed   IVC FILTER REMOVAL N/A 02/24/2017   Procedure: IVC Filter Removal;  Surgeon: [name], MD;  Location: [place];  Service: Cardiovascular;  Laterality: N/A;   ORIF RADIAL FRACTURE Right 12/05/2020   Procedure: OPEN REDUCTION INTERNAL FIXATION (ORIF) RADIAL FRACTURE and ULNA FRACTURE;  Surgeon: Carole Civil, MD;  [facility];  Service: Orthopedics;  Laterality: Right;   PERIPHERAL VASCULAR CATHETERIZATION  Right 10/22/2016   Procedure: Thrombectomy, thrombolyisis;  Surgeon: [name], MD;  Location: [place] [facility];  Service: Cardiovascular;  Laterality: Right;   PERIPHERAL VASCULAR CATHETERIZATION N/A 10/22/2016   Procedure: IVC Filter Insertion;  Surgeon: [name], MD;  Location: [facility];  Service: Cardiovascular;  Laterality: N/A;   POLYPECTOMY  03/18/2019   Procedure: POLYPECTOMY;  Surgeon: [name], MD;  [facility];  Service: Endoscopy;;    There were no vitals filed for this visit.   Subjective Assessment - 06/06/21 0807     Subjective Patient states exercises have been helping a little bit but still hurts like hell. HEP is going well.    Limitations House hold activities;Lifting    Currently in Pain? Yes    Pain Score 10-Worst pain ever    Pain Location Neck    Pain Descriptors / Indicators Aching;Burning    Pain Type Chronic pain    Pain Onset More than a month ago                               [facility] Adult PT Treatment/Exercise - 06/06/21 0001       Neck Exercises:  Seated   Neck Retraction 10 reps;3 secs    Neck Retraction Limitations 2 sets    W Back 10 reps    W Back Limitations red band    Shoulder Rolls 10 reps    Other Seated Exercise scap retractions 10 x 3"      Neck Exercises: Supine   Neck Retraction 10 reps;3 secs    Neck Retraction Limitations 2 sets    Other Supine Exercise scap ret 3x 10    Other Supine Exercise decompression with band 2x 10 each with red band      Neck Exercises: Stretches   Upper Trapezius Stretch Right;Left;2 reps;30 seconds                    PT Education - 06/06/21 0807     Education Details HEP    Person(s) Educated Patient    Methods Explanation    Comprehension Verbalized understanding              PT Short Term Goals - 05/29/21 1102       PT SHORT TERM GOAL #1   Title Patient will be independent with HEP in order to improve  functional outcomes.    Baseline Reports varied compliance    Time 2    Period Weeks    Status Partially Met    Target Date 05/15/21      PT SHORT TERM GOAL #2   Title Patient will report at least 25% improvement in symptoms for improved quality of life.    Baseline Reports 50%    Time 2    Period Weeks    Status Achieved    Target Date 05/15/21               PT Long Term Goals - 05/29/21 1102       PT LONG TERM GOAL #1   Title Patient will report at least 75% improvement in symptoms for improved quality of life.    Baseline Reports 50%    Time 4    Period Weeks    Status On-going      PT LONG TERM GOAL #2   Title Patient will demonstrate at least 25% improvement in cervical ROM in all restricted planes for improved ability to move head while completing chores.    Baseline See AROM    Time 4    Period Weeks    Status Partially Met                   Plan - 06/06/21 0807     Clinical Impression Statement Patient continues to sit with poor posture likely contributing to cervical symptoms. Patient able to complete cervical retractions in supine with good mechanics with minimal cueing for decreasing intensity. Patient performs exercises with good mechanics and positioning following initial demonstration with intermittent c/o cervical and UT symptoms. Patient educated on completing band decompression exercises at home.  Patient will continue to benefit from skilled physical therapy in order to reduce impairment and improve function.    Personal Factors and Comorbidities Age;Time since onset of injury/illness/exacerbation;Past/Current Experience;Comorbidity 1;Comorbidity 2;Transportation    Comorbidities bipolar, chronic pain    Examination-Activity Limitations Bend;Caring for Others;Lift;Carry    Examination-Participation Restrictions Meal Prep;Cleaning;Occupation;Shop;Volunteer;Yard Work;Driving    Stability/Clinical Decision Making Stable/Uncomplicated    Rehab  Potential Fair    PT Frequency 2x / week    PT Duration 4 weeks    PT Treatment/Interventions ADLs/Self Care Home Management;Aquatic Therapy;Electrical Stimulation;Iontophoresis  72m/ml Dexamethasone;Moist Heat;Traction;Ultrasound;Parrafin;DME Instruction;Gait training;Stair training;Functional mobility training;Therapeutic activities;Therapeutic exercise;Neuromuscular re-education;Patient/family education;Balance training;Orthotic Fit/Training;Manual techniques;Scar mobilization;Passive range of motion;Dry needling;Energy conservation;Taping;Spinal Manipulations;Joint Manipulations    PT Next Visit Plan Conitnue to progress postural strength and cervical mobility as tolerated. Manual as needed.    PT Home Exercise Plan cervical retraction supine, scap ret, supine neck flexion, seated retractions, extension SNAG; decompression with band    Consulted and Agree with Plan of Care Patient             Patient will benefit from skilled therapeutic intervention in order to improve the following deficits and impairments:  Decreased range of motion, Impaired UE functional use, Increased muscle spasms, Decreased activity tolerance, Pain, Impaired flexibility, Improper body mechanics, Postural dysfunction, Decreased strength, Decreased mobility  Visit Diagnosis: Cervicalgia  Other symptoms and signs involving the musculoskeletal system     Problem List Patient Active Problem List   Diagnosis Date Noted   Closed fracture of right distal radius and ulna 12/21/2020   Closed fracture of right forearm    Daily headache 11/27/2020   Dizzy spells 11/27/2020   Seizure ([place] 11/27/2020   Myofascial pain dysfunction syndrome 11/22/2020   Cocaine abuse ([place] 11/18/2020   Foreign body (FB) in soft tissue 09/08/2020   Chronic low back pain 05/11/2020   Shoulder pain 05/11/2020   Headache 05/11/2020   Cervical radiculopathy 02/18/2020   Impingement syndrome of right shoulder 01/18/2020   Rectal pain  06/29/2019   GERD (gastroesophageal reflux disease) 06/29/2019   Rectal bleeding 02/22/2019   Priapism 02/08/2019   Chest pain 10/19/2017   Asthma 10/19/2017   Hypertension 10/19/2017   Arthralgia of right knee 10/19/2017   Pain in limb 12/27/2016   Acute pulmonary embolism (HCC) 10/23/2016   Nausea and vomiting 10/23/2016   S/P IVC filter 10/23/2016   Tobacco abuse counseling 10/23/2016   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

## 2021-06-07 ENCOUNTER — Encounter (HOSPITAL_COMMUNITY): Payer: Medicaid Other | Admitting: Physical Therapy

## 2021-06-07 ENCOUNTER — Telehealth (HOSPITAL_COMMUNITY): Payer: Self-pay | Admitting: Physical Therapy

## 2021-06-07 NOTE — Telephone Encounter (Signed)
2 nd no show  Let pt know about his next visit.  Explained that all other visits have been cancelled due to 2 no shows.  He will need to make one visit at a time  Rayetta Humphrey, [place] CLT [phone]

## 2021-06-12 ENCOUNTER — Other Ambulatory Visit: Payer: Self-pay

## 2021-06-12 ENCOUNTER — Telehealth: Payer: Self-pay | Admitting: Radiology

## 2021-06-12 ENCOUNTER — Emergency Department (HOSPITAL_COMMUNITY)
Admission: EM | Admit: 2021-06-12 | Discharge: 2021-06-12 | Disposition: A | Discharge status: Home / Self Care | Payer: Medicaid Other

## 2021-06-12 NOTE — Telephone Encounter (Signed)
Called patient to RS his surgery, hopefully to Sept 9th Left message for him to call me back to discuss.

## 2021-06-12 NOTE — Telephone Encounter (Signed)
I spoke to patient he is agreeable to RS from Aug 30th to Sept 9th

## 2021-06-13 ENCOUNTER — Ambulatory Visit (HOSPITAL_COMMUNITY): Payer: Medicaid Other | Admitting: Physical Therapy

## 2021-06-13 NOTE — Patient Instructions (Signed)
JESSIAH HIPPLE  06/13/2021     '@[email or web address]'$ @   Your procedure is scheduled on  06/29/2021.   Report to [facility] at  1030 A.M.   Call this number if you have problems the morning of surgery:  [phone]   Remember:  Do not eat or drink after midnight.      Take these medicines the morning of surgery with A SIP OF WATER                                      None     Do not wear jewelry, make-up or nail polish.  Do not wear lotions, powders, or perfumes, or deodorant.  Do not shave 48 hours prior to surgery.  Men may shave face and neck.  Do not bring valuables to the hospital.  [facility] is not responsible for any belongings or valuables.  Contacts, dentures or bridgework may not be worn into surgery.  Leave your suitcase in the car.  After surgery it may be brought to your room.  For patients admitted to the hospital, discharge time will be determined by your treatment team.  Patients discharged the day of surgery will not be allowed to drive home and must have someone with them for 24 hours.    Special instructions:   DO NOT smoke tobacco or vape for 24 hours before your procedure.  Please read over the following fact sheets that you were given. Coughing and Deep Breathing, Surgical Site Infection Prevention, Anesthesia Post-op Instructions, and Care and Recovery After Surgery      Hand or Foot Foreign Body, Adult A foreign body is an object that gets into your body that should not be there. Your hands and feet are common entry points for foreign bodies. Common examples of foreign bodies include: Splinters. Thorns. Pieces of glass, wood, metal, or plastic. Metal objects such as needles, nails, and fishing hooks. Foreign bodies that are not removed quickly may cause infection. What are the causes? A foreign body can get in your hand or foot through an injury, such as a scrapeor cut, or if something punctures your skin. What are the signs or  symptoms? Symptoms of having recently gotten a foreign body in your hand or foot include: Pain. Redness. Swelling. Tenderness. Numbness or tingling. Noticing something under the skin. If the foreign body has caused an infection, symptoms may also include: Fever. Warmth in the area of the puncture. A lump that you can feel under the skin. Not being able to use your hand or put weight (bear weight) on your foot. Greenish or yellow fluid coming from the puncture area. How is this diagnosed? Your health care provider will diagnose a foreign body in the hand or foot by taking a medical history and examining your wound. If the foreign body is deep,you may also have tests, such as an X-ray, MRI, or ultrasound. In some cases, your health care provider may need to numb the area, open it upto examine what is inside your skin, and remove the object. How is this treated? Your health care provider may be able to remove the foreign body while exploring your wound during the diagnosis. If the foreign body is deep or in awound that is infected, you may need to have a procedure to remove it. If an incision is needed, it may be closed with stitches (  sutures), skin glue, or adhesive strips. A bandage (dressing) will be placed over the incision. You may also need to get a tetanus shot or take antibiotic medicines to preventor treat an infection. Follow these instructions at home: Medicines Take over-the-counter and prescription medicines only as told by your health care provider. If you were prescribed an antibiotic medicine, take the antibiotic as told by your health care provider. Do not stop taking the antibiotic even if you start to feel better. Wound care  Change your dressing as told by your health care provider. Follow instructions from your health care provider about how to take care of your wound or incision. Make sure you: Wash your hands with soap and water before you change your dressing. If  soap and water are not available, use hand sanitizer. Change your dressing as told by your health care provider. Leave sutures, skin glue, or adhesive strips in place. These skin closures may need to stay in place for 2 weeks or longer. If adhesive strip edges start to loosen and curl up, you may trim the loose edges. Do not remove adhesive strips completely unless your health care provider tells you to do that. Check your puncture area or incision every day for signs of infection. Check for: More redness, swelling, or pain. More fluid or blood. Warmth. Pus or a bad smell.  General instructions Return to your normal activities as told by your health care provider. Ask your health care provider what activities are safe for you. Do not take baths, swim, or use a hot tub until your health care provider approves. Ask your health care provider if you may take showers. You may only be allowed to take sponge baths. Keep all follow-up visits as told by your health care provider. This is important. How is this prevented? To protect yourself: Wear gloves when working with sharp objects. Do not walk barefoot in areas where there may be sharp objects. Wear thick-soled shoes or boots when walking in areas where there are sharp objects. Contact a health care provider if: You have a foreign body that has not come out or been removed. You have more redness, swelling, or pain around the puncture area or incision. You have more fluid or blood coming from the puncture area or incision. Your puncture area or incision feels warm to the touch. You have pus or a bad smell coming from the puncture area or incision. You have a fever. You were given a tetanus shot, and you have swelling, severe pain, redness, or bleeding at the injection site. Get help right away if: Your puncture area becomes numb. You cannot use your hand or foot. Summary A foreign body is an object that gets into your body that should not be  there. Your hands and feet are common entry points for foreign bodies. Your health care provider may be able to remove the foreign body while exploring your wound during the diagnosis. If the foreign body is deep or in a wound that is infected, you may need to have a procedure to remove it. You may also need to get a tetanus shot or take antibiotic medicines to prevent or treat an infection. Check your puncture area or incision every day for signs of infection. This information is not intended to replace advice given to you by your health care provider. Make sure you discuss any questions you have with your healthcare provider. Document Revised: 08/02/2020 Document Reviewed: 08/02/2020 Elsevier Patient Education  [place]  Anesthesia, Adult, Care After This sheet gives you information about how to care for yourself after your procedure. Your health care provider may also give you more specific instructions. If you have problems or questions, contact your health careprovider. What can I expect after the procedure? After the procedure, the following side effects are common: Pain or discomfort at the IV site. Nausea. Vomiting. Sore throat. Trouble concentrating. Feeling cold or chills. Feeling weak or tired. Sleepiness and fatigue. Soreness and body aches. These side effects can affect parts of the body that were not involved in surgery. Follow these instructions at home: For the time period you were told by your health care provider:  Rest. Do not participate in activities where you could fall or become injured. Do not drive or use machinery. Do not drink alcohol. Do not take sleeping pills or medicines that cause drowsiness. Do not make important decisions or sign legal documents. Do not take care of children on your own.  Eating and drinking Follow any instructions from your health care provider about eating or drinking restrictions. When you feel hungry, start by  eating small amounts of foods that are soft and easy to digest (bland), such as toast. Gradually return to your regular diet. Drink enough fluid to keep your urine pale yellow. If you vomit, rehydrate by drinking water, juice, or clear broth. General instructions If you have sleep apnea, surgery and certain medicines can increase your risk for breathing problems. Follow instructions from your health care provider about wearing your sleep device: Anytime you are sleeping, including during daytime naps. While taking prescription pain medicines, sleeping medicines, or medicines that make you drowsy. Have a responsible adult stay with you for the time you are told. It is important to have someone help care for you until you are awake and alert. Return to your normal activities as told by your health care provider. Ask your health care provider what activities are safe for you. Take over-the-counter and prescription medicines only as told by your health care provider. If you smoke, do not smoke without supervision. Keep all follow-up visits as told by your health care provider. This is important. Contact a health care provider if: You have nausea or vomiting that does not get better with medicine. You cannot eat or drink without vomiting. You have pain that does not get better with medicine. You are unable to pass urine. You develop a skin rash. You have a fever. You have redness around your IV site that gets worse. Get help right away if: You have difficulty breathing. You have chest pain. You have blood in your urine or stool, or you vomit blood. Summary After the procedure, it is common to have a sore throat or nausea. It is also common to feel tired. Have a responsible adult stay with you for the time you are told. It is important to have someone help care for you until you are awake and alert. When you feel hungry, start by eating small amounts of foods that are soft and easy to digest  (bland), such as toast. Gradually return to your regular diet. Drink enough fluid to keep your urine pale yellow. Return to your normal activities as told by your health care provider. Ask your health care provider what activities are safe for you. This information is not intended to replace advice given to you by your health care provider. Make sure you discuss any questions you have with your healthcare provider. Document Revised: 06/22/2020 Document Reviewed:  01/20/2020 Elsevier Patient Education  2022 [place]. How to Use Chlorhexidine for Bathing Chlorhexidine gluconate (CHG) is a germ-killing (antiseptic) solution that is used to clean the skin. It can get rid of the bacteria that normally live on the skin and can keep them away for about 24 hours. To clean your skin with CHG, you may be given: A CHG solution to use in the shower or as part of a sponge bath. A prepackaged cloth that contains CHG. Cleaning your skin with CHG may help lower the risk for infection: While you are staying in the intensive care unit of the hospital. If you have a vascular access, such as a central line, to provide short-term or long-term access to your veins. If you have a catheter to drain urine from your bladder. If you are on a ventilator. A ventilator is a machine that helps you breathe by moving air in and out of your lungs. After surgery. What are the risks? Risks of using CHG include: A skin reaction. Hearing loss, if CHG gets in your ears. Eye injury, if CHG gets in your eyes and is not rinsed out. The CHG product catching fire. Make sure that you avoid smoking and flames after applying CHG to your skin. Do not use CHG: If you have a chlorhexidine allergy or have previously reacted to chlorhexidine. On babies younger than 2 months of age. How to use CHG solution Use CHG only as told by your health care provider, and follow the instructions on the label. Use the full amount of CHG as directed.  Usually, this is one bottle. During a shower Follow these steps when using CHG solution during a shower (unless your health care provider gives you different instructions): Start the shower. Use your normal soap and shampoo to wash your face and hair. Turn off the shower or move out of the shower stream. Pour the CHG onto a clean washcloth. Do not use any type of brush or rough-edged sponge. Starting at your neck, lather your body down to your toes. Make sure you follow these instructions: If you will be having surgery, pay special attention to the part of your body where you will be having surgery. Scrub this area for at least 1 minute. Do not use CHG on your head or face. If the solution gets into your ears or eyes, rinse them well with water. Avoid your genital area. Avoid any areas of skin that have broken skin, cuts, or scrapes. Scrub your back and under your arms. Make sure to wash skin folds. Let the lather sit on your skin for 1-2 minutes or as long as told by your health care provider. Thoroughly rinse your entire body in the shower. Make sure that all body creases and crevices are rinsed well. Dry off with a clean towel. Do not put any substances on your body afterward--such as powder, lotion, or perfume--unless you are told to do so by your health care provider. Only use lotions that are recommended by the manufacturer. Put on clean clothes or pajamas. If it is the night before your surgery, sleep in clean sheets.  During a sponge bath Follow these steps when using CHG solution during a sponge bath (unless your health care provider gives you different instructions): Use your normal soap and shampoo to wash your face and hair. Pour the CHG onto a clean washcloth. Starting at your neck, lather your body down to your toes. Make sure you follow these instructions: If you will be  having surgery, pay special attention to the part of your body where you will be having surgery. Scrub this  area for at least 1 minute. Do not use CHG on your head or face. If the solution gets into your ears or eyes, rinse them well with water. Avoid your genital area. Avoid any areas of skin that have broken skin, cuts, or scrapes. Scrub your back and under your arms. Make sure to wash skin folds. Let the lather sit on your skin for 1-2 minutes or as long as told by your health care provider. Using a different clean, wet washcloth, thoroughly rinse your entire body. Make sure that all body creases and crevices are rinsed well. Dry off with a clean towel. Do not put any substances on your body afterward--such as powder, lotion, or perfume--unless you are told to do so by your health care provider. Only use lotions that are recommended by the manufacturer. Put on clean clothes or pajamas. If it is the night before your surgery, sleep in clean sheets. How to use CHG prepackaged cloths Only use CHG cloths as told by your health care provider, and follow the instructions on the label. Use the CHG cloth on clean, dry skin. Do not use the CHG cloth on your head or face unless your health care provider tells you to. When washing with the CHG cloth: Avoid your genital area. Avoid any areas of skin that have broken skin, cuts, or scrapes. Before surgery Follow these steps when using a CHG cloth to clean before surgery (unless your health care provider gives you different instructions): Using the CHG cloth, vigorously scrub the part of your body where you will be having surgery. Scrub using a back-and-forth motion for 3 minutes. The area on your body should be completely wet with CHG when you are done scrubbing. Do not rinse. Discard the cloth and let the area air-dry. Do not put any substances on the area afterward, such as powder, lotion, or perfume. Put on clean clothes or pajamas. If it is the night before your surgery, sleep in clean sheets.  For general bathing Follow these steps when using CHG  cloths for general bathing (unless your health care provider gives you different instructions). Use a separate CHG cloth for each area of your body. Make sure you wash between any folds of skin and between your fingers and toes. Wash your body in the following order, switching to a new cloth after each step: The front of your neck, shoulders, and chest. Both of your arms, under your arms, and your hands. Your stomach and groin area, avoiding the genitals. Your right leg and foot. Your left leg and foot. The back of your neck, your back, and your buttocks. Do not rinse. Discard the cloth and let the area air-dry. Do not put any substances on your body afterward--such as powder, lotion, or perfume--unless you are told to do so by your health care provider. Only use lotions that are recommended by the manufacturer. Put on clean clothes or pajamas. Contact a health care provider if: Your skin gets irritated after scrubbing. You have questions about using your solution or cloth. Get help right away if: Your eyes become very red or swollen. Your eyes itch badly. Your skin itches badly and is red or swollen. Your hearing changes. You have trouble seeing. You have swelling or tingling in your mouth or throat. You have trouble breathing. You swallow any chlorhexidine. Summary Chlorhexidine gluconate (CHG) is a germ-killing (antiseptic)  solution that is used to clean the skin. Cleaning your skin with CHG may help to lower your risk for infection. You may be given CHG to use for bathing. It may be in a bottle or in a prepackaged cloth to use on your skin. Carefully follow your health care provider's instructions and the instructions on the product label. Do not use CHG if you have a chlorhexidine allergy. Contact your health care provider if your skin gets irritated after scrubbing. This information is not intended to replace advice given to you by your health care provider. Make sure you discuss any  questions you have with your healthcare provider. Document Revised: 02/18/2020 Document Reviewed: 03/24/2020 Elsevier Patient Education  [place].

## 2021-06-15 ENCOUNTER — Encounter (HOSPITAL_COMMUNITY): Payer: Medicaid Other

## 2021-06-15 ENCOUNTER — Encounter (HOSPITAL_COMMUNITY)
Admission: RE | Admit: 2021-06-15 | Discharge: 2021-06-15 | Disposition: A | Discharge status: Home / Self Care | Payer: Medicaid Other | Source: Ambulatory Visit | Attending: Orthopedic Surgery | Admitting: Orthopedic Surgery

## 2021-06-16 ENCOUNTER — Emergency Department (HOSPITAL_COMMUNITY): Payer: Medicaid Other

## 2021-06-16 ENCOUNTER — Observation Stay (HOSPITAL_COMMUNITY)
Admission: EM | Admit: 2021-06-16 | Discharge: 2021-06-17 | Disposition: A | Discharge status: Home / Self Care | Payer: Medicaid Other | Attending: Internal Medicine | Admitting: Internal Medicine

## 2021-06-16 ENCOUNTER — Observation Stay (HOSPITAL_COMMUNITY): Payer: Medicaid Other

## 2021-06-16 ENCOUNTER — Other Ambulatory Visit: Payer: Self-pay

## 2021-06-16 ENCOUNTER — Encounter (HOSPITAL_COMMUNITY): Payer: Self-pay | Admitting: Emergency Medicine

## 2021-06-16 DIAGNOSIS — J45909 Unspecified asthma, uncomplicated: Secondary | ICD-10-CM | POA: Diagnosis not present

## 2021-06-16 DIAGNOSIS — I82511 Chronic embolism and thrombosis of right femoral vein: Secondary | ICD-10-CM

## 2021-06-16 DIAGNOSIS — Z7901 Long term (current) use of anticoagulants: Secondary | ICD-10-CM | POA: Diagnosis not present

## 2021-06-16 DIAGNOSIS — Z20822 Contact with and (suspected) exposure to covid-19: Secondary | ICD-10-CM | POA: Insufficient documentation

## 2021-06-16 DIAGNOSIS — I82509 Chronic embolism and thrombosis of unspecified deep veins of unspecified lower extremity: Secondary | ICD-10-CM | POA: Diagnosis present

## 2021-06-16 DIAGNOSIS — R001 Bradycardia, unspecified: Secondary | ICD-10-CM | POA: Diagnosis not present

## 2021-06-16 DIAGNOSIS — I10 Essential (primary) hypertension: Secondary | ICD-10-CM | POA: Insufficient documentation

## 2021-06-16 DIAGNOSIS — F17210 Nicotine dependence, cigarettes, uncomplicated: Secondary | ICD-10-CM | POA: Insufficient documentation

## 2021-06-16 DIAGNOSIS — R0789 Other chest pain: Secondary | ICD-10-CM | POA: Diagnosis not present

## 2021-06-16 DIAGNOSIS — Z86711 Personal history of pulmonary embolism: Secondary | ICD-10-CM | POA: Diagnosis present

## 2021-06-16 DIAGNOSIS — Z79899 Other long term (current) drug therapy: Secondary | ICD-10-CM | POA: Insufficient documentation

## 2021-06-16 LAB — COMPREHENSIVE METABOLIC PANEL
ALT: 13 U/L (ref 0–44)
AST: 14 U/L — ABNORMAL LOW (ref 15–41)
Albumin: 4 g/dL (ref 3.5–5.0)
Alkaline Phosphatase: 85 U/L (ref 38–126)
Anion gap: 4 — ABNORMAL LOW (ref 5–15)
BUN: 11 mg/dL (ref 6–20)
CO2: 27 mmol/L (ref 22–32)
Calcium: 8.8 mg/dL — ABNORMAL LOW (ref 8.9–10.3)
Chloride: 107 mmol/L (ref 98–111)
Creatinine, Ser: 1 mg/dL (ref 0.61–1.24)
GFR, Estimated: >60 mL/min (ref >60)
Glucose, Bld: 98 mg/dL (ref 70–99)
Potassium: 4 mmol/L (ref 3.5–5.1)
Sodium: 138 mmol/L (ref 135–145)
Total Bilirubin: 0.5 mg/dL (ref 0.3–1.2)
Total Protein: 7 g/dL (ref 6.5–8.1)

## 2021-06-16 LAB — URINALYSIS, ROUTINE W REFLEX MICROSCOPIC
Bacteria, UA: NONE SEEN
Bilirubin Urine: NEGATIVE
Glucose, UA: NEGATIVE mg/dL
Hgb urine dipstick: NEGATIVE
Ketones, ur: 5 mg/dL — AB
Nitrite: NEGATIVE
Protein, ur: NEGATIVE mg/dL
Specific Gravity, Urine: 1.02 (ref 1.005–1.030)
pH: 5 (ref 5.0–8.0)

## 2021-06-16 LAB — TROPONIN I (HIGH SENSITIVITY)
Troponin I (High Sensitivity): 6 ng/L (ref <18)
Troponin I (High Sensitivity): 5 ng/L (ref <18)

## 2021-06-16 LAB — RESP PANEL BY RT-PCR (FLU A&B, COVID) ARPGX2
Influenza A by PCR: NEGATIVE
Influenza B by PCR: NEGATIVE
SARS Coronavirus 2 by RT PCR: NEGATIVE

## 2021-06-16 LAB — CBC WITH DIFFERENTIAL/PLATELET
Abs Immature Granulocytes: 0.02 10*3/uL (ref 0.00–0.07)
Basophils Absolute: 0.1 10*3/uL (ref 0.0–0.1)
Basophils Relative: 1 %
Eosinophils Absolute: 0.2 10*3/uL (ref 0.0–0.5)
Eosinophils Relative: 3 %
HCT: 45.2 % (ref 39.0–52.0)
Hemoglobin: 15.6 g/dL (ref 13.0–17.0)
Immature Granulocytes: 0 %
Lymphocytes Relative: 31 %
Lymphs Abs: 2.4 10*3/uL (ref 0.7–4.0)
MCH: 33.8 pg (ref 26.0–34.0)
MCHC: 34.5 g/dL (ref 30.0–36.0)
MCV: 98 fL (ref 80.0–100.0)
Monocytes Absolute: 0.7 10*3/uL (ref 0.1–1.0)
Monocytes Relative: 9 %
Neutro Abs: 4.3 10*3/uL (ref 1.7–7.7)
Neutrophils Relative %: 56 %
Platelets: 302 10*3/uL (ref 150–400)
RBC: 4.61 MIL/uL (ref 4.22–5.81)
RDW: 12.2 % (ref 11.5–15.5)
WBC: 7.7 10*3/uL (ref 4.0–10.5)
nRBC: 0 % (ref 0.0–0.2)

## 2021-06-16 LAB — TSH: TSH: 1.715 u[IU]/mL (ref 0.350–4.500)

## 2021-06-16 LAB — MAGNESIUM: Magnesium: 2.1 mg/dL (ref 1.7–2.4)

## 2021-06-16 MED ORDER — RIVAROXABAN 20 MG PO TABS
20.0000 mg | ORAL_TABLET | Freq: Every day | ORAL | Status: DC
  Administered 2021-06-16: 20 mg via ORAL
  Filled 2021-06-16: qty 1

## 2021-06-16 MED ORDER — IOHEXOL 350 MG/ML SOLN
100.0000 mL | Freq: Once | INTRAVENOUS | Status: AC | PRN
  Administered 2021-06-16: 100 mL via INTRAVENOUS

## 2021-06-16 NOTE — ED Notes (Signed)
Pt went to CT

## 2021-06-16 NOTE — ED Triage Notes (Signed)
Patient c/o central chest pain that started last night and is progressively getting worse. Denies pain radiating. Patient has shortness of breath but states "I always do." Denies any change in shortness of breath. Per patient also has nausea and vomiting. Unsure of cardiac hx states "I was admitted once at [facility] for possible heart attack but they never told me if I had one." Patient has "blood clot in right groin" in which he supposed to take Xarelto for but has currently stopped (a month ago) for surgery (removal of BB from knee)."

## 2021-06-16 NOTE — ED Notes (Signed)
Pts HR draws to SB [id number], denies any SOB.  Melvern Sample, PA informed.
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
## 2021-06-16 NOTE — ED Notes (Signed)
[facility] took pt.

## 2021-06-16 NOTE — H&P (Signed)
History and Physical  Lance Cohen [id number] DOB: 04/26/1971 DOA: 06/16/2021  Referring physician: Rayna Sexton, PA-C, EDP PCP: [name], NP  Outpatient Specialists:  Patient Coming From: home  Chief Complaint: Chest pressure  HPI: Lance Cohen is a 50 y.o. male with a history of bipolar, chronic DVT on Xarelto, asthma.  Patient seen for chest pressure that started yesterday.  Has been continuing since his the onset.  No palliating or provoking factors.  On arrival, it was noted that he was bradycardic with a heart rate between 34 on mid 50s.  When his heart rate would drop into the 30s and 40s, the patient would become a little lightheaded.  Cardiology was consulted who recommended observation and an echocardiogram.  The patient is supposed to have surgery fairly soon and took himself off Xarelto at the beginning of August.  Emergency Department Course: Troponin is normal.  Chest x-ray normal.  CTA normal.  Review of Systems:   Pt denies any fevers, chills, nausea, vomiting, diarrhea, constipation, abdominal pain, shortness of breath, dyspnea on exertion, orthopnea, cough, wheezing, palpitations, headache, vision changes, lightheadedness, dizziness, melena, rectal bleeding.  Review of systems are otherwise negative  Past Medical History:  Diagnosis Date   Anxiety    Arthritis    Asthma    Bipolar 1 disorder ([place])    Chronic knee pain    Depression    DVT (deep venous thrombosis) ([place])    Hypertension    Insomnia    Sleep apnea    Spinal stenosis    Suicide attempt ([place])    2010 Intentional overdose attempt with Depakote.   Past Surgical History:  Procedure Laterality Date   COLONOSCOPY WITH PROPOFOL N/A 03/18/2019   Procedure: COLONOSCOPY WITH PROPOFOL;  Surgeon: [name], MD;  [facility];  Service: Endoscopy;  Laterality: N/A;  2:30pm   FOOT SURGERY     had peice of wire removed   IVC FILTER REMOVAL N/A 02/24/2017    Procedure: IVC Filter Removal;  Surgeon: [name], MD;  Location: [place] [facility];  Service: Cardiovascular;  Laterality: N/A;   ORIF RADIAL FRACTURE Right 12/05/2020   Procedure: OPEN REDUCTION INTERNAL FIXATION (ORIF) RADIAL FRACTURE and ULNA FRACTURE;  Surgeon: Carole Civil, MD;  Location: [facility];  Service: Orthopedics;  Laterality: Right;   PERIPHERAL VASCULAR CATHETERIZATION Right 10/22/2016   Procedure: Thrombectomy, thrombolyisis;  Surgeon: [name], MD;  Location: [place] [facility];  Service: Cardiovascular;  Laterality: Right;   PERIPHERAL VASCULAR CATHETERIZATION N/A 10/22/2016   Procedure: IVC Filter Insertion;  Surgeon: [name], MD;  Location: [place] [facility];  Service: Cardiovascular;  Laterality: N/A;   POLYPECTOMY  03/18/2019   Procedure: POLYPECTOMY;  Surgeon: [name], MD;  [facility];  Service: Endoscopy;;   Social History:  reports that he has been smoking cigarettes. He has been smoking an average of .5 packs per day. He has never used smokeless tobacco. He reports current alcohol use. He reports current drug use. Drug: Marijuana. Patient lives at home  Allergies  Allergen Reactions   Bee Venom Swelling   Penicillins Other (See Comments)    PT states that he had symptoms that felt like he was having a heart attack Has patient had a PCN reaction causing immediate rash, facial/tongue/throat swelling, SOB or lightheadedness with hypotension: No Has patient had a PCN reaction causing severe rash involving mucus membranes or skin necrosis: No Has patient  had a PCN reaction that required hospitalization No Has patient had a PCN reaction occurring within the last 10 years: No If all of the above answers are "NO", then may proceed with Cephal   Zofran [Ondansetron] Hives   Other Palpitations    Steroiod   Sulfa Antibiotics Itching    Family History  Problem Relation Age of Onset   Cancer Mother    Heart attack Father     Colon cancer Neg Hx       Prior to Admission medications   Medication Sig Start Date End Date Taking? Authorizing Provider  albuterol (VENTOLIN HFA) 108 (90 Base) MCG/ACT inhaler Inhale 1-2 puffs into the lungs every 6 (six) hours as needed for wheezing or shortness of breath. 01/17/21  Yes Dorie Rank, MD  EPINEPHrine 0.3 mg/0.3 mL IJ SOAJ injection Inject 0.3 mg into the muscle as needed for anaphylaxis (For severe allergic reaction only with difficulty breathing, tongue swelling or throat swelling.). 04/08/21  Yes Rancour, Annie Main, MD  allopurinol (ZYLOPRIM) 100 MG tablet Take 1 tablet (100 mg total) by mouth daily. Patient not taking: No sig reported 09/12/20   Rosemarie Ax, MD  amLODipine (NORVASC) 10 MG tablet Take 1 tablet by mouth daily. Patient not taking: No sig reported 03/29/21   [provider]  colchicine 0.6 MG tablet Take 1 tablet (0.6 mg total) by mouth 2 (two) times daily. Patient not taking: Reported on 06/16/2021 04/05/21   Carole Civil, MD  famotidine (PEPCID) 20 MG tablet Take 1 tablet (20 mg total) by mouth 2 (two) times daily. Patient not taking: No sig reported 04/08/21   Rancour, Annie Main, MD  lisinopril (ZESTRIL) 10 MG tablet Take 1 tablet by mouth daily. Patient not taking: No sig reported 03/29/21   [provider]  QUEtiapine (SEROQUEL) 100 MG tablet Take 100 mg by mouth at bedtime. Patient not taking: No sig reported 03/29/21   [provider]  rivaroxaban (XARELTO) 20 MG TABS tablet Take 1 tablet (20 mg total) by mouth daily with breakfast. 04/09/21   Schnier, Dolores Lory, MD    Physical Exam: BP 112/85   Pulse (!) 50   Temp 98.1 F (36.7 C) (Oral)   Resp 13   Ht '5\' 11"'$  (1.803 m)   Wt 104.3 kg   SpO2 100%   BMI 32.08 kg/m   General: Middle-age male. Awake and alert and oriented x3. No acute cardiopulmonary distress.  HEENT: Normocephalic atraumatic.  Right and left ears normal in appearance.  Pupils equal, round, reactive  to light. Extraocular muscles are intact. Sclerae anicteric and noninjected.  Moist mucosal membranes. No mucosal lesions.  Neck: Neck supple without lymphadenopathy. No carotid bruits. No masses palpated.  Cardiovascular: Bradycardic rate with normal S1-S2 sounds. No murmurs, rubs, gallops auscultated. No JVD.  Respiratory: Good respiratory effort with no wheezes, rales, rhonchi. Lungs clear to auscultation bilaterally.  No accessory muscle use. Abdomen: Soft, nontender, nondistended. Active bowel sounds. No masses or hepatosplenomegaly  Skin: No rashes, lesions, or ulcerations.  Dry, warm to touch. 2+ dorsalis pedis and radial pulses. Musculoskeletal: No calf or leg pain. All major joints not erythematous nontender.  No upper or lower joint deformation.  Good ROM.  No contractures  Psychiatric: Intact judgment and insight. Pleasant and cooperative. Neurologic: No focal neurological deficits. Strength is 5/5 and symmetric in upper and lower extremities.  Cranial nerves II through XII are grossly intact.           Labs on Admission:  I have personally reviewed following labs and imaging studies  CBC: Recent Labs  Lab 06/16/21 1241  WBC 7.7  NEUTROABS 4.3  HGB 15.6  HCT 45.2  MCV 98.0  PLT 99991111   Basic Metabolic Panel: Recent Labs  Lab 06/16/21 1241  NA 138  K 4.0  CL 107  CO2 27  GLUCOSE 98  BUN 11  CREATININE 1.00  CALCIUM 8.8*  MG 2.1   GFR: Estimated Creatinine Clearance: 109.8 mL/min (by C-G formula based on SCr of 1 mg/dL). Liver Function Tests: Recent Labs  Lab 06/16/21 1241  AST 14*  ALT 13  ALKPHOS 85  BILITOT 0.5  PROT 7.0  ALBUMIN 4.0   No results for input(s): LIPASE, AMYLASE in the last 168 hours. No results for input(s): AMMONIA in the last 168 hours. Coagulation Profile: No results for input(s): INR, PROTIME in the last 168 hours. Cardiac Enzymes: No results for input(s): CKTOTAL, CKMB, CKMBINDEX, TROPONINI in the last 168 hours. BNP (last 3  results) No results for input(s): PROBNP in the last 8760 hours. HbA1C: No results for input(s): HGBA1C in the last 72 hours. CBG: No results for input(s): GLUCAP in the last 168 hours. Lipid Profile: No results for input(s): CHOL, HDL, LDLCALC, TRIG, CHOLHDL, LDLDIRECT in the last 72 hours. Thyroid Function Tests: Recent Labs    06/16/21 1241  TSH 1.715   Anemia Panel: No results for input(s): VITAMINB12, FOLATE, FERRITIN, TIBC, IRON, RETICCTPCT in the last 72 hours. Urine analysis:    Component Value Date/Time   COLORURINE YELLOW 06/16/2021 1303   APPEARANCEUR HAZY (A) 06/16/2021 1303   LABSPEC 1.020 06/16/2021 1303   PHURINE 5.0 06/16/2021 1303   GLUCOSEU NEGATIVE 06/16/2021 1303   HGBUR NEGATIVE 06/16/2021 1303   BILIRUBINUR NEGATIVE 06/16/2021 1303   KETONESUR 5 (A) 06/16/2021 1303   PROTEINUR NEGATIVE 06/16/2021 1303   UROBILINOGEN 0.2 12/08/2013 1735   NITRITE NEGATIVE 06/16/2021 1303   LEUKOCYTESUR LARGE (A) 06/16/2021 1303   Sepsis Labs: '@LABRCNTIP'$ (procalcitonin:4,lacticidven:4) )No results found for this or any previous visit (from the past 240 hour(s)).   Radiological Exams on Admission: CT Angio Chest PE W and/or Wo Contrast  Result Date: 06/16/2021 CLINICAL DATA:  PE suspected, high prob Chest pain off xarelto for 1 month. progressively getting worse, nausea and vomiting, bradycardia-heart rate dropping to 38. EXAM: CT ANGIOGRAPHY CHEST WITH CONTRAST TECHNIQUE: Multidetector CT imaging of the chest was performed using the standard protocol during bolus administration of intravenous contrast. Multiplanar CT image reconstructions and MIPs were obtained to evaluate the vascular anatomy. CONTRAST:  144m OMNIPAQUE IOHEXOL 350 MG/ML SOLN COMPARISON:  None. FINDINGS: Cardiovascular: Satisfactory opacification of the pulmonary arteries to the segmental level. No evidence of pulmonary embolism. The main pulmonary artery is normal in caliber. Normal heart size. No  significant pericardial effusion. The thoracic aorta is normal in caliber. No atherosclerotic plaque of the thoracic aorta. Query left anterior descending coronary artery calcifications. Mediastinum/Nodes: No enlarged mediastinal, hilar, or axillary lymph nodes. Trace debris within the trachea. Otherwise thyroid gland, trachea, and esophagus demonstrate no significant findings. Lungs/Pleura: Bilateral lower lobe subsegmental atelectasis. No focal consolidation. Few stable scattered pulmonary micronodules. No new pulmonary nodule. No pulmonary mass. No pleural effusion. No pneumothorax. Upper Abdomen: No acute abnormality. Musculoskeletal: No chest wall abnormality. No suspicious lytic or blastic osseous lesions. No acute displaced fracture. Multilevel degenerative changes of the spine. Review of the MIP images confirms the above findings. IMPRESSION: 1. No pulmonary embolus. 2. No acute intrathoracic abnormality. Electronically Signed  By: Iven Finn M.D.   On: 06/16/2021 17:17   DG Chest Portable 1 View  Result Date: 06/16/2021 CLINICAL DATA:  Chest pain. EXAM: PORTABLE CHEST 1 VIEW COMPARISON:  January 17, 2021. FINDINGS: The heart size and mediastinal contours are within normal limits. Both lungs are clear. The visualized skeletal structures are unremarkable. IMPRESSION: No active disease. Electronically Signed   By: Marijo Conception M.D.   On: 06/16/2021 13:33    EKG: Independently reviewed.  Bradycardia. anterior septal infarct.  No acute ST changes.  Assessment/Plan: Principal Problem:   Bradycardia Active Problems:   Chronic deep vein thrombosis (DVT) (H[facility])   History of pulmonary embolus (PE)    This patient was discussed with the ED physician, including pertinent vitals, physical exam findings, labs, and imaging.  We also discussed care given by the ED provider.  Bradycardia We will send the patient to [facility] so cardiology can consult on his bradycardia Will get echo in  the morning  History of PE with chronic DVT on chronic anticoagulation CTA negative for PE Restart Xarelto, which he should stop 2 to 3 days prior to surgery  DVT prophylaxis: Xarelto Consultants: Cardiology Code Status: Full code Family Communication: None Disposition Plan: Pending   Truett Mainland, DO

## 2021-06-16 NOTE — ED Notes (Signed)
Pt is being monitored on AED.  Dr Laverta Baltimore and Elmyra Ricks, RN (charge nurse) informed.

## 2021-06-16 NOTE — ED Provider Notes (Signed)
Emergency Medicine Provider Triage Evaluation Note  Lance Cohen , a 50 y.o. male  was evaluated in triage.  Pt complains of chest pain that started around 5 PM yesterday.  Describes it as central and pressure-like.  It has been constant.  No modifying factors.  Denies any acute shortness of breath, nausea, vomiting, diaphoresis.  States that he has a chronic blood clot in his groin and discontinued his Xarelto about 1 month ago for an upcoming surgery to remove a BB from his knee.  Physical Exam  BP 111/78 (BP Location: Right Arm)   Pulse (!) 58   Temp 98.1 F (36.7 C) (Oral)   Resp 16   Ht '5\' 11"'$  (1.803 m)   Wt 104.3 kg   SpO2 98%   BMI 32.08 kg/m  Gen:   Awake, no distress   Resp:  Normal effort  MSK:   Moves extremities without difficulty  Other:    Medical Decision Making  Medically screening exam initiated at 12:32 PM.  Appropriate orders placed.  Lance Cohen was informed that the remainder of the evaluation will be completed by another provider, this initial triage assessment does not replace that evaluation, and the importance of remaining in the ED until their evaluation is complete.   Rayna Sexton, PA-C 06/16/21 1233    Margette Fast, MD 06/17/21 [phone]

## 2021-06-17 ENCOUNTER — Observation Stay (HOSPITAL_BASED_OUTPATIENT_CLINIC_OR_DEPARTMENT_OTHER): Payer: Medicaid Other

## 2021-06-17 ENCOUNTER — Encounter (HOSPITAL_COMMUNITY): Payer: Self-pay | Admitting: Family Medicine

## 2021-06-17 ENCOUNTER — Other Ambulatory Visit: Payer: Self-pay | Admitting: Physician Assistant

## 2021-06-17 DIAGNOSIS — R0789 Other chest pain: Secondary | ICD-10-CM | POA: Diagnosis not present

## 2021-06-17 DIAGNOSIS — R9431 Abnormal electrocardiogram [ECG] [EKG]: Secondary | ICD-10-CM

## 2021-06-17 DIAGNOSIS — R001 Bradycardia, unspecified: Secondary | ICD-10-CM | POA: Diagnosis not present

## 2021-06-17 LAB — BASIC METABOLIC PANEL
Anion gap: 4 — ABNORMAL LOW (ref 5–15)
BUN: 10 mg/dL (ref 6–20)
CO2: 28 mmol/L (ref 22–32)
Calcium: 8.7 mg/dL — ABNORMAL LOW (ref 8.9–10.3)
Chloride: 105 mmol/L (ref 98–111)
Creatinine, Ser: 1.14 mg/dL (ref 0.61–1.24)
GFR, Estimated: >60 mL/min (ref >60)
Glucose, Bld: 87 mg/dL (ref 70–99)
Potassium: 4.3 mmol/L (ref 3.5–5.1)
Sodium: 137 mmol/L (ref 135–145)

## 2021-06-17 LAB — CBC
HCT: 42.8 % (ref 39.0–52.0)
Hemoglobin: 14.9 g/dL (ref 13.0–17.0)
MCH: 33.5 pg (ref 26.0–34.0)
MCHC: 34.8 g/dL (ref 30.0–36.0)
MCV: 96.2 fL (ref 80.0–100.0)
Platelets: 280 10*3/uL (ref 150–400)
RBC: 4.45 MIL/uL (ref 4.22–5.81)
RDW: 12.2 % (ref 11.5–15.5)
WBC: 9.3 10*3/uL (ref 4.0–10.5)
nRBC: 0 % (ref 0.0–0.2)

## 2021-06-17 LAB — ECHOCARDIOGRAM COMPLETE
Area-P 1/2: 2.76 cm2
Calc EF: 55 %
Height: 71 in
S' Lateral: 2.8 cm
Single Plane A2C EF: 52.5 %
Single Plane A4C EF: 59 %
Weight: 3428.59 oz

## 2021-06-17 LAB — HIV ANTIBODY (ROUTINE TESTING W REFLEX): HIV Screen 4th Generation wRfx: NONREACTIVE

## 2021-06-17 NOTE — Progress Notes (Signed)
  Echocardiogram 2D Echocardiogram has been performed.  Fidel Levy 06/17/2021, 10:13 AM

## 2021-06-17 NOTE — Discharge Summary (Signed)
DISCHARGE SUMMARY  Lance Cohen  MR#: [id number]  DOB:[birth year]  Date of Admission: 06/16/2021 Date of Discharge: 06/17/2021  Attending Physician:Lance Bohne Hennie Duos, MD  Patient's [id number], Lance Lango, NP  Consults: [facility] cardiology  Disposition: Discharge home  Follow-up Appts:  Follow-up Information     Branch, Alphonse Guild, MD Follow up.   Specialty: Cardiology Why: office will call regarding monitor and follow up Contact information: [place] [address] [phone]         [name], NP Follow up in 1 week(s).   Specialty: Adult Health Nurse Practitioner Contact information: [place] [address] [phone]                 Tests Needing Follow-up: -assess HR  -assess compliance w/ anticoag   Discharge Diagnoses: Possible symptomatic bradycardia Atypical chest pain History of chronic DVT with PE  Initial presentation: 50 year old with a history of bipolar disorder, chronic DVT on Xarelto, and asthma who presented to the ED with 24 hours of continuous chest pressure without clear provoking factors.  In the ER he was found to be bradycardic with heart rate in the 30s/40s.  He reported lightheadedness at the times he was noted to be dipping into the 30s.  Hospital Course:  Symptomatic bradycardia Cardiology evaluated during admission - no heart block appreciated - lowest confirmed on telemetry is a heart rate of 40s -plan for outpt 14 day Zio patch (to be arranged as outpt)   Atypical chest pain Symptoms not suggestive of CAD - coronary CTA 2018 without significant disease -TTE w/o acute findings and no WMA therefore no further w/u planned    History of chronic DVT with PE CTa negative for PE - resumed Xarelto which patient had stopped 2 to 3 days prior to this admission due to a "pending surgery"  Allergies as of 06/17/2021       Reactions   Bee Venom Swelling    Penicillins Other (See Comments)   PT states that he had symptoms that felt like he was having a heart attack Has patient had a PCN reaction causing immediate rash, facial/tongue/throat swelling, SOB or lightheadedness with hypotension: No Has patient had a PCN reaction causing severe rash involving mucus membranes or skin necrosis: No Has patient had a PCN reaction that required hospitalization No Has patient had a PCN reaction occurring within the last 10 years: No If all of the above answers are "NO", then may proceed with Cephal   Zofran [ondansetron] Hives   Other Palpitations   Steroiod   Sulfa Antibiotics Itching        Medication List     TAKE these medications    albuterol 108 (90 Base) MCG/ACT inhaler Commonly known as: VENTOLIN HFA Inhale 1-2 puffs into the lungs every 6 (six) hours as needed for wheezing or shortness of breath.   EPINEPHrine 0.3 mg/0.3 mL Soaj injection Commonly known as: EPI-PEN Inject 0.3 mg into the muscle as needed for anaphylaxis (For severe allergic reaction only with difficulty breathing, tongue swelling or throat swelling.).   rivaroxaban 20 MG Tabs tablet Commonly known as: XARELTO Take 1 tablet (20 mg total) by mouth daily with breakfast.        Day of Discharge BP 110/67 (BP Location: Right Arm)   Pulse 62   Temp 98.4 F (36.9 C) (Oral)   Resp 18   Ht '5\' 11"'$  (1.803 m)   Wt 97.2 kg   SpO2  99%   BMI 29.89 kg/m   Physical Exam: General: No acute respiratory distress Lungs: Clear to auscultation bilaterally without wheezes or crackles Cardiovascular: Regular rate and rhythm without murmur gallop or rub normal S1 and S2 Abdomen: Nontender, nondistended, soft, bowel sounds positive, no rebound, no ascites, no appreciable mass Extremities: No significant cyanosis, clubbing, or edema bilateral lower extremities  Basic Metabolic Panel: Recent Labs  Lab 06/16/21 1241 06/17/21 0628  NA 138 137  K 4.0 4.3  CL 107 105  CO2 27  28  GLUCOSE 98 87  BUN 11 10  CREATININE 1.00 1.14  CALCIUM 8.8* 8.7*  MG 2.1  --     Liver Function Tests: Recent Labs  Lab 06/16/21 1241  AST 14*  ALT 13  ALKPHOS 85  BILITOT 0.5  PROT 7.0  ALBUMIN 4.0    CBC: Recent Labs  Lab 06/16/21 1241 06/17/21 0628  WBC 7.7 9.3  NEUTROABS 4.3  --   HGB 15.6 14.9  HCT 45.2 42.8  MCV 98.0 96.2  PLT 302 280    Recent Results (from the past 240 hour(s))  Resp Panel by RT-PCR (Flu A&B, Covid) Nasopharyngeal Swab     Status: None   Collection Time: 06/16/21  5:52 PM   Specimen: Nasopharyngeal Swab; Nasopharyngeal(NP) swabs in vial transport medium  Result Value Ref Range Status   SARS Coronavirus 2 by RT PCR NEGATIVE NEGATIVE Final    Comment: (NOTE) SARS-CoV-2 target nucleic acids are NOT DETECTED.  The SARS-CoV-2 RNA is generally detectable in upper respiratory specimens during the acute phase of infection. The lowest concentration of SARS-CoV-2 viral copies this assay can detect is 138 copies/mL. A negative result does not preclude SARS-Cov-2 infection and should not be used as the sole basis for treatment or other patient management decisions. A negative result may occur with  improper specimen collection/handling, submission of specimen other than nasopharyngeal swab, presence of viral mutation(s) within the areas targeted by this assay, and inadequate number of viral copies(<138 copies/mL). A negative result must be combined with clinical observations, patient history, and epidemiological information. The expected result is Negative.  Fact Sheet for Patients:  EntrepreneurPulse.com.au  Fact Sheet for Healthcare Providers:  IncredibleEmployment.be  This test is no t yet approved or cleared by the [place] FDA and  has been authorized for detection and/or diagnosis of SARS-CoV-2 by FDA under an Emergency Use Authorization (EUA). This EUA will remain  in effect (meaning this  test can be used) for the duration of the COVID-19 declaration under Section 564(b)(1) of the Act, 21 U.S.C.section 360bbb-3(b)(1), unless the authorization is terminated  or revoked sooner.       Influenza A by PCR NEGATIVE NEGATIVE Final   Influenza B by PCR NEGATIVE NEGATIVE Final    Comment: (NOTE) The Xpert Xpress SARS-CoV-2/FLU/RSV plus assay is intended as an aid in the diagnosis of influenza from Nasopharyngeal swab specimens and should not be used as a sole basis for treatment. Nasal washings and aspirates are unacceptable for Xpert Xpress SARS-CoV-2/FLU/RSV testing.  Fact Sheet for Patients: EntrepreneurPulse.com.au  Fact Sheet for Healthcare Providers: IncredibleEmployment.be  This test is not yet approved or cleared by the [place] FDA and has been authorized for detection and/or diagnosis of SARS-CoV-2 by FDA under an Emergency Use Authorization (EUA). This EUA will remain in effect (meaning this test can be used) for the duration of the COVID-19 declaration under Section 564(b)(1) of the Act, 21 U.S.C. section 360bbb-3(b)(1), unless the authorization is terminated  or revoked.  Performed at [facility], [address] [place],  [address]       Time spent in discharge (includes decision making & examination of pt): 30 minutes  06/17/2021, 11:57 AM   Cherene Altes, MD [facility] Office  [phone]

## 2021-06-17 NOTE — Consult Note (Signed)
Cardiology Consultation:   Patient ID: Lance Cohen MRN: [id number]; DOB: May 15, 1971  Admit date: 06/16/2021 Date of Consult: 06/17/2021  PCP:  [name], NP   [facility] Providers Cardiologist:  New      Patient Profile:   Lance Cohen is a 50 y.o. male with a hx of HTN, OSA, chronic DVT on xarelto  who is being seen 06/17/2021 for the evaluation of chest pain and bradycardia  at the request of Dr Thereasa Solo.  History of Present Illness:   Mr. Lance Cohen 50 yo male history of biopolar, HTN, OSA, chronic DVT on xarelto, presents with chest pain and also bradycardia. Admission 2018 with atypical chest pain, coronary CTA without significant disease.  He describes chest pain midchest into right chest that started while in bed Friday night. Has been constant since that time (now nearly 36 hours). Some vomiting on Saturday, pain worst with vomiting. During ER workup noted to have some bradycardia to high 30s low 40s.    K 4 Cr 1 BUN 11 WBC 12.2 Hgb 15.6 Plt 302 TSH 1.7 Mg 2.1  Trop 6-->5 COVID neg EKG NSR, no acute ischemic changes. Follow up EKG sinus brady 44 CT PE: no PE CXR no acute process 09/2017 cardiac CTA: minimal plaque mid LAD 0-25% stneosis Echo pending  Past Medical History:  Diagnosis Date   Anxiety    Arthritis    Asthma    Bipolar 1 disorder (HCC)    Chronic knee pain    Depression    DVT (deep venous thrombosis) ([place])    Hypertension    Insomnia    Sleep apnea    Spinal stenosis    Suicide attempt ([place])    2010 Intentional overdose attempt with Depakote.    Past Surgical History:  Procedure Laterality Date   COLONOSCOPY WITH PROPOFOL N/A 03/18/2019   Procedure: COLONOSCOPY WITH PROPOFOL;  Surgeon: [name], MD;  [facility];  Service: Endoscopy;  Laterality: N/A;  2:30pm   FOOT SURGERY     had peice of wire removed   IVC FILTER REMOVAL N/A 02/24/2017   Procedure: IVC Filter Removal;  Surgeon: [name], MD;   Location: [place] [facility];  Service: Cardiovascular;  Laterality: N/A;   ORIF RADIAL FRACTURE Right 12/05/2020   Procedure: OPEN REDUCTION INTERNAL FIXATION (ORIF) RADIAL FRACTURE and ULNA FRACTURE;  Surgeon: Carole Civil, MD;  Location: [facility];  Service: Orthopedics;  Laterality: Right;   PERIPHERAL VASCULAR CATHETERIZATION Right 10/22/2016   Procedure: Thrombectomy, thrombolyisis;  Surgeon: [name], MD;  Location: [place] [facility];  Service: Cardiovascular;  Laterality: Right;   PERIPHERAL VASCULAR CATHETERIZATION N/A 10/22/2016   Procedure: IVC Filter Insertion;  Surgeon: [name], MD;  Location: [place] [facility];  Service: Cardiovascular;  Laterality: N/A;   POLYPECTOMY  03/18/2019   Procedure: POLYPECTOMY;  Surgeon: [name], MD;  [facility]: [facility];  Service: Endoscopy;;       Inpatient Medications: Scheduled Meds:  rivaroxaban  20 mg Oral Q supper   Continuous Infusions:  PRN Meds:   Allergies:    Allergies  Allergen Reactions   Bee Venom Swelling   Penicillins Other (See Comments)    PT states that he had symptoms that felt like he was having a heart attack Has patient had a PCN reaction causing immediate rash, facial/tongue/throat swelling, SOB or lightheadedness with hypotension: No Has patient had a PCN reaction causing severe rash involving mucus  membranes or skin necrosis: No Has patient had a PCN reaction that required hospitalization No Has patient had a PCN reaction occurring within the last 10 years: No If all of the above answers are "NO", then may proceed with Cephal   Zofran [Ondansetron] Hives   Other Palpitations    Steroiod   Sulfa Antibiotics Itching    Social History:   Social History   Socioeconomic History   Marital status: Married    Spouse name: Not on file   Number of children: Not on file   Years of education: Not on file   Highest education level: Not on file  Occupational History   Not on  file  Tobacco Use   Smoking status: Every Day    Packs/day: 0.50    Types: Cigarettes   Smokeless tobacco: Never  Vaping Use   Vaping Use: Never used  Substance and Sexual Activity   Alcohol use: Yes    Comment: seldom   Drug use: Yes    Types: Marijuana    Comment: smokes for pain   Sexual activity: Yes  Other Topics Concern   Not on file  Social History Narrative   Not on file   Social Determinants of Health   Financial Resource Strain: Not on file  Food Insecurity: Not on file  Transportation Needs: Not on file  Physical Activity: Not on file  Stress: Not on file  Social Connections: Not on file  Intimate Partner Violence: Not on file    Family History:    Family History  Problem Relation Age of Onset   Cancer Mother    Heart attack Father    Colon cancer Neg Hx      ROS:  Please see the history of present illness.  All other ROS reviewed and negative.     Physical Exam/Data:   Vitals:   06/16/21 1910 06/17/21 0013 06/17/21 0403 06/17/21 0700  BP: (!) 145/87 96/64 100/71 (!) 111/57  Pulse: (!) 50 (!) 51 (!) 50 (!) 58  Resp: '18 16 17 18  '$ Temp: 98.4 F (36.9 C) 98.2 F (36.8 C) 97.9 F (36.6 C) 98.1 F (36.7 C)  TempSrc: Oral Oral Oral Oral  SpO2: 100% 97% 98% 99%  Weight: 97.3 kg  97.2 kg   Height: '5\' 11"'$  (1.803 m)       Intake/Output Summary (Last 24 hours) at 06/17/2021 0849 Last data filed at 06/17/2021 0737 Gross per 24 hour  Intake 460 ml  Output 450 ml  Net 10 ml   Last 3 Weights 06/17/2021 06/16/2021 06/16/2021  Weight (lbs) 214 lb 4.6 oz 214 lb 8.1 oz 230 lb  Weight (kg) 97.2 kg 97.3 kg 104.327 kg     Body mass index is 29.89 kg/m.  General:  Well nourished, well developed, in no acute distress HEENT: normal Lymph: no adenopathy Neck: no JVD Endocrine:  No thryomegaly Vascular: No carotid bruits; FA pulses 2+ bilaterally without bruits  Cardiac:  normal S1, S2; RRR; no murmur  Lungs:  clear to auscultation bilaterally, no wheezing,  rhonchi or rales  Abd: soft, nontender, no hepatomegaly  Ext: no edema Musculoskeletal:  No deformities, BUE and BLE strength normal and equal Skin: warm and dry  Neuro:  CNs 2-12 intact, no focal abnormalities noted Psych:  Normal affect     Laboratory Data:  High Sensitivity Troponin:   Recent Labs  Lab 06/16/21 1241 06/16/21 1435  TROPONINIHS 6 5     Chemistry Recent Labs  Lab  06/16/21 1241 06/17/21 0628  NA 138 137  K 4.0 4.3  CL 107 105  CO2 27 28  GLUCOSE 98 87  BUN 11 10  CREATININE 1.00 1.14  CALCIUM 8.8* 8.7*  GFRNONAA >60 >60  ANIONGAP 4* 4*    Recent Labs  Lab 06/16/21 1241  PROT 7.0  ALBUMIN 4.0  AST 14*  ALT 13  ALKPHOS 85  BILITOT 0.5   Hematology Recent Labs  Lab 06/16/21 1241 06/17/21 0628  WBC 7.7 9.3  RBC 4.61 4.45  HGB 15.6 14.9  HCT 45.2 42.8  MCV 98.0 96.2  MCH 33.8 33.5  MCHC 34.5 34.8  RDW 12.2 12.2  PLT 302 280   BNPNo results for input(s): BNP, PROBNP in the last 168 hours.  DDimer No results for input(s): DDIMER in the last 168 hours.   Radiology/Studies:  CT Angio Chest PE W and/or Wo Contrast  Result Date: 06/16/2021 CLINICAL DATA:  PE suspected, high prob Chest pain off xarelto for 1 month. progressively getting worse, nausea and vomiting, bradycardia-heart rate dropping to 38. EXAM: CT ANGIOGRAPHY CHEST WITH CONTRAST TECHNIQUE: Multidetector CT imaging of the chest was performed using the standard protocol during bolus administration of intravenous contrast. Multiplanar CT image reconstructions and MIPs were obtained to evaluate the vascular anatomy. CONTRAST:  12m OMNIPAQUE IOHEXOL 350 MG/ML SOLN COMPARISON:  None. FINDINGS: Cardiovascular: Satisfactory opacification of the pulmonary arteries to the segmental level. No evidence of pulmonary embolism. The main pulmonary artery is normal in caliber. Normal heart size. No significant pericardial effusion. The thoracic aorta is normal in caliber. No atherosclerotic plaque  of the thoracic aorta. Query left anterior descending coronary artery calcifications. Mediastinum/Nodes: No enlarged mediastinal, hilar, or axillary lymph nodes. Trace debris within the trachea. Otherwise thyroid gland, trachea, and esophagus demonstrate no significant findings. Lungs/Pleura: Bilateral lower lobe subsegmental atelectasis. No focal consolidation. Few stable scattered pulmonary micronodules. No new pulmonary nodule. No pulmonary mass. No pleural effusion. No pneumothorax. Upper Abdomen: No acute abnormality. Musculoskeletal: No chest wall abnormality. No suspicious lytic or blastic osseous lesions. No acute displaced fracture. Multilevel degenerative changes of the spine. Review of the MIP images confirms the above findings. IMPRESSION: 1. No pulmonary embolus. 2. No acute intrathoracic abnormality. Electronically Signed   By: MIven FinnM.D.   On: 06/16/2021 17:17   DG Chest Portable 1 View  Result Date: 06/16/2021 CLINICAL DATA:  Chest pain. EXAM: PORTABLE CHEST 1 VIEW COMPARISON:  January 17, 2021. FINDINGS: The heart size and mediastinal contours are within normal limits. Both lungs are clear. The visualized skeletal structures are unremarkable. IMPRESSION: No active disease. Electronically Signed   By: JMarijo ConceptionM.D.   On: 06/16/2021 13:33     Assessment and Plan:   1.Chest pain - atypical chest pain constant nearly 36 hours. No objective evidence of ishcemia by EKG or enzymes. Pain worst with vomiting. Coroanry CTA in 2018 without significant diseaes - f/u echo, in absence of significant findings no plans for further cardiac testing.   2.Bradycardia - EKG with transient sinus brady to 44, initial EKG sinus in 60s. Tele sinus brady to low 40s at times, often 60s to 70s as well, no heart block noted.  - remote hisotry of syncope, no recent episodes. Nonspecific dizziness at times - he is not on any av nodal agents.  - TSH normal  - would plan outpatient 14 day zio  patch.  OLake Robertsfor discharge pending echo, monitor would be set up as outpatient.  For questions or updates, please contact [place] Please consult [email or web address] for contact info under    Signed, Carlyle Dolly, MD  06/17/2021 8:49 AM

## 2021-06-17 NOTE — Progress Notes (Signed)
Nursing DC note  Patient alert and oriented. VSS. DC instructions reviewed , patient verbalized understanding. All belongings given to patient. Patient awaiting a friend Lance Cohen to take him home.

## 2021-06-18 ENCOUNTER — Telehealth: Payer: Self-pay | Admitting: Cardiology

## 2021-06-18 ENCOUNTER — Encounter (HOSPITAL_COMMUNITY): Payer: Medicaid Other | Admitting: Physical Therapy

## 2021-06-18 NOTE — Telephone Encounter (Signed)
LMTCB to schedule patient for a hospital f/u after 2 week zio monitor.

## 2021-06-20 ENCOUNTER — Encounter (HOSPITAL_COMMUNITY): Payer: Medicaid Other | Admitting: Physical Therapy

## 2021-06-26 ENCOUNTER — Encounter (HOSPITAL_COMMUNITY): Payer: Medicaid Other | Admitting: Physical Therapy

## 2021-06-26 ENCOUNTER — Ambulatory Visit (INDEPENDENT_AMBULATORY_CARE_PROVIDER_SITE_OTHER): Payer: Medicaid Other

## 2021-06-26 ENCOUNTER — Telehealth: Payer: Self-pay | Admitting: Orthopedic Surgery

## 2021-06-26 DIAGNOSIS — R001 Bradycardia, unspecified: Secondary | ICD-10-CM

## 2021-06-26 NOTE — Telephone Encounter (Signed)
He will need cardiac clearance for surgery since he is having chest pain  Sent letter to cardiology   Sent message to [facility]  Left message for patient  To Dr Aline Brochure

## 2021-06-26 NOTE — Telephone Encounter (Signed)
Call received from patient relaying that he went to [facility] emergency department 06/16/21 due to chest pain; states was admitted, and is now to be on a heart monitor. Please advise regarding surgery scheduled for 06/29/21. New Ph# [phone]

## 2021-06-27 ENCOUNTER — Encounter (HOSPITAL_COMMUNITY): Payer: Self-pay

## 2021-06-27 ENCOUNTER — Encounter (HOSPITAL_COMMUNITY): Payer: Medicaid Other

## 2021-06-28 ENCOUNTER — Encounter (HOSPITAL_COMMUNITY): Payer: Medicaid Other | Admitting: Physical Therapy

## 2021-06-28 DIAGNOSIS — R001 Bradycardia, unspecified: Secondary | ICD-10-CM | POA: Diagnosis not present

## 2021-06-29 ENCOUNTER — Encounter (HOSPITAL_COMMUNITY): Admission: RE | Payer: Self-pay | Source: Ambulatory Visit

## 2021-06-29 ENCOUNTER — Ambulatory Visit (HOSPITAL_COMMUNITY): Admission: RE | Admit: 2021-06-29 | Payer: Medicaid Other | Source: Ambulatory Visit | Admitting: Orthopedic Surgery

## 2021-06-29 SURGERY — REMOVAL FOREIGN BODY EXTREMITY
Anesthesia: Choice | Laterality: Right

## 2021-07-04 ENCOUNTER — Other Ambulatory Visit: Payer: Self-pay

## 2021-07-04 ENCOUNTER — Observation Stay (HOSPITAL_COMMUNITY)
Admission: EM | Admit: 2021-07-04 | Discharge: 2021-07-05 | Disposition: A | Discharge status: Home / Self Care | Payer: Medicaid Other | Attending: Internal Medicine | Admitting: Internal Medicine

## 2021-07-04 ENCOUNTER — Emergency Department (HOSPITAL_COMMUNITY): Payer: Medicaid Other

## 2021-07-04 ENCOUNTER — Encounter (HOSPITAL_COMMUNITY): Payer: Self-pay

## 2021-07-04 DIAGNOSIS — J4520 Mild intermittent asthma, uncomplicated: Secondary | ICD-10-CM | POA: Diagnosis not present

## 2021-07-04 DIAGNOSIS — Z20822 Contact with and (suspected) exposure to covid-19: Secondary | ICD-10-CM | POA: Diagnosis not present

## 2021-07-04 DIAGNOSIS — I10 Essential (primary) hypertension: Secondary | ICD-10-CM | POA: Insufficient documentation

## 2021-07-04 DIAGNOSIS — F17210 Nicotine dependence, cigarettes, uncomplicated: Secondary | ICD-10-CM | POA: Insufficient documentation

## 2021-07-04 DIAGNOSIS — K219 Gastro-esophageal reflux disease without esophagitis: Secondary | ICD-10-CM | POA: Insufficient documentation

## 2021-07-04 DIAGNOSIS — E876 Hypokalemia: Secondary | ICD-10-CM | POA: Diagnosis not present

## 2021-07-04 DIAGNOSIS — N179 Acute kidney failure, unspecified: Secondary | ICD-10-CM | POA: Diagnosis not present

## 2021-07-04 DIAGNOSIS — Z7901 Long term (current) use of anticoagulants: Secondary | ICD-10-CM | POA: Insufficient documentation

## 2021-07-04 DIAGNOSIS — R55 Syncope and collapse: Principal | ICD-10-CM

## 2021-07-04 LAB — CBC WITH DIFFERENTIAL/PLATELET
Abs Immature Granulocytes: 0.03 10*3/uL (ref 0.00–0.07)
Basophils Absolute: 0.1 10*3/uL (ref 0.0–0.1)
Basophils Relative: 1 %
Eosinophils Absolute: 0.2 10*3/uL (ref 0.0–0.5)
Eosinophils Relative: 2 %
HCT: 44.7 % (ref 39.0–52.0)
Hemoglobin: 15.4 g/dL (ref 13.0–17.0)
Immature Granulocytes: 0 %
Lymphocytes Relative: 34 %
Lymphs Abs: 3.8 10*3/uL (ref 0.7–4.0)
MCH: 33.6 pg (ref 26.0–34.0)
MCHC: 34.5 g/dL (ref 30.0–36.0)
MCV: 97.6 fL (ref 80.0–100.0)
Monocytes Absolute: 0.8 10*3/uL (ref 0.1–1.0)
Monocytes Relative: 7 %
Neutro Abs: 6.4 10*3/uL (ref 1.7–7.7)
Neutrophils Relative %: 56 %
Platelets: 289 10*3/uL (ref 150–400)
RBC: 4.58 MIL/uL (ref 4.22–5.81)
RDW: 11.9 % (ref 11.5–15.5)
WBC: 11.3 10*3/uL — ABNORMAL HIGH (ref 4.0–10.5)
nRBC: 0 % (ref 0.0–0.2)

## 2021-07-04 LAB — BASIC METABOLIC PANEL WITH GFR
Anion gap: 8 (ref 5–15)
BUN: 22 mg/dL — ABNORMAL HIGH (ref 6–20)
CO2: 27 mmol/L (ref 22–32)
Calcium: 8.6 mg/dL — ABNORMAL LOW (ref 8.9–10.3)
Chloride: 104 mmol/L (ref 98–111)
Creatinine, Ser: 1.58 mg/dL — ABNORMAL HIGH (ref 0.61–1.24)
GFR, Estimated: 53 mL/min — ABNORMAL LOW
Glucose, Bld: 145 mg/dL — ABNORMAL HIGH (ref 70–99)
Potassium: 3.1 mmol/L — ABNORMAL LOW (ref 3.5–5.1)
Sodium: 139 mmol/L (ref 135–145)

## 2021-07-04 LAB — RESP PANEL BY RT-PCR (FLU A&B, COVID) ARPGX2
Influenza A by PCR: NEGATIVE
Influenza B by PCR: NEGATIVE
SARS Coronavirus 2 by RT PCR: NEGATIVE

## 2021-07-04 LAB — BLOOD GAS, VENOUS
Acid-Base Excess: 4 mmol/L — ABNORMAL HIGH (ref 0.0–2.0)
Bicarbonate: 24.3 mmol/L (ref 20.0–28.0)
FIO2: 21
O2 Saturation: 37.6 %
Patient temperature: 36.9
pCO2, Ven: 65.3 mmHg — ABNORMAL HIGH (ref 44.0–60.0)
pH, Ven: 7.287 (ref 7.250–7.430)
pO2, Ven: <31 mmHg — CL (ref 32.0–45.0)

## 2021-07-04 LAB — TROPONIN I (HIGH SENSITIVITY)
Troponin I (High Sensitivity): 3 ng/L (ref <18)
Troponin I (High Sensitivity): 5 ng/L (ref <18)

## 2021-07-04 LAB — CBG MONITORING, ED: Glucose-Capillary: 158 mg/dL — ABNORMAL HIGH (ref 70–99)

## 2021-07-04 LAB — TSH: TSH: 4.875 u[IU]/mL — ABNORMAL HIGH (ref 0.350–4.500)

## 2021-07-04 LAB — MAGNESIUM: Magnesium: 2.1 mg/dL (ref 1.7–2.4)

## 2021-07-04 MED ORDER — POTASSIUM CHLORIDE CRYS ER 20 MEQ PO TBCR
40.0000 meq | EXTENDED_RELEASE_TABLET | Freq: Once | ORAL | Status: AC
  Administered 2021-07-04: 40 meq via ORAL
  Filled 2021-07-04: qty 2

## 2021-07-04 MED ORDER — CALCIUM GLUCONATE-NACL 1-0.675 GM/50ML-% IV SOLN
1.0000 g | Freq: Once | INTRAVENOUS | Status: AC
  Administered 2021-07-04: 1000 mg via INTRAVENOUS
  Filled 2021-07-04: qty 50

## 2021-07-04 MED ORDER — ALBUTEROL SULFATE HFA 108 (90 BASE) MCG/ACT IN AERS: 1.0000 | INHALATION_SPRAY | Freq: Four times a day (QID) | RESPIRATORY_TRACT | Status: DC | PRN

## 2021-07-04 MED ORDER — SODIUM CHLORIDE 0.9 % IV SOLN: INTRAVENOUS | Status: AC

## 2021-07-04 MED ORDER — SODIUM CHLORIDE 0.9% FLUSH
3.0000 mL | Freq: Two times a day (BID) | INTRAVENOUS | Status: DC
  Administered 2021-07-04 – 2021-07-05 (×3): 3 mL via INTRAVENOUS

## 2021-07-04 MED ORDER — ACETAMINOPHEN 650 MG RE SUPP: 650.0000 mg | Freq: Four times a day (QID) | RECTAL | Status: DC | PRN

## 2021-07-04 MED ORDER — ACETAMINOPHEN 325 MG PO TABS: 650.0000 mg | ORAL_TABLET | Freq: Four times a day (QID) | ORAL | Status: DC | PRN

## 2021-07-04 MED ORDER — PROMETHAZINE HCL 12.5 MG PO TABS
12.5000 mg | ORAL_TABLET | Freq: Four times a day (QID) | ORAL | Status: DC | PRN
  Administered 2021-07-05: 12.5 mg via ORAL
  Filled 2021-07-04: qty 1

## 2021-07-04 MED ORDER — RIVAROXABAN 20 MG PO TABS
20.0000 mg | ORAL_TABLET | Freq: Every day | ORAL | Status: DC
  Administered 2021-07-05: 20 mg via ORAL
  Filled 2021-07-04: qty 1

## 2021-07-04 NOTE — H&P (Signed)
History and Physical    DEJUAN BARCOMB [id number] DOB: 1971-03-06 DOA: 07/04/2021  PCP: [name], NP (Confirm with patient/family/NH records and if not entered, this has to be entered at [facility] point of entry) Patient coming from: Home  I have personally briefly reviewed patient's old medical records in [place]  Chief Complaint: Feeling ok  HPI: ELIOENAI HINKLE is a 50 y.o. male with medical history significant of mild intermittent asthma, DVT on Xarelto, anxiety/depression, came with syncope.  Patient was recently hospitalized for chest pain episode when it was found his heart rate sometime can go bradycardia as 30-40s, no heart blockage seen. Before today, patient only has a remote history of near syncope in his past. Patient was started on Zio patch 6 days ago and has been feeling fine. This afternoon, wife found patient altered mention compared to normal about 5 minutes ago. "Color turned blue and breathing very slow". EMS came and found patient in bradycardia and agonal breathing and bagged him for 4-5 min, but no narcan given, and patient resumed normal breathing, but remained sleepy. Patient does no recall anything other than he was feeling "about to throw up" some time before the episode. No pain, no loss control of urine or BM, no fever or chills, no headache. Use marijuana everyday.  ED Course: No bradycardia or hypotension, no hypoxia. Head CT negative and chest xray unremarkable. K=3.1, Cr 1.5.  Review of Systems: As per HPI otherwise 14 point review of systems negative.    Past Medical History:  Diagnosis Date   Anxiety    Arthritis    Asthma    Bipolar 1 disorder ([place])    Chronic knee pain    Depression    DVT (deep venous thrombosis) ([place])    Hypertension    Insomnia    Sleep apnea    Spinal stenosis    Suicide attempt ([place])    2010 Intentional overdose attempt with Depakote.    Past Surgical History:  Procedure Laterality Date    COLONOSCOPY WITH PROPOFOL N/A 03/18/2019   Procedure: COLONOSCOPY WITH PROPOFOL;  Surgeon: [name], MD;  [facility];  Service: Endoscopy;  Laterality: N/A;  2:30pm   FOOT SURGERY     had peice of wire removed   IVC FILTER REMOVAL N/A 02/24/2017   Procedure: IVC Filter Removal;  Surgeon: [name], MD;  Location: [facility];  Service: Cardiovascular;  Laterality: N/A;   ORIF RADIAL FRACTURE Right 12/05/2020   Procedure: OPEN REDUCTION INTERNAL FIXATION (ORIF) RADIAL FRACTURE and ULNA FRACTURE;  Surgeon: Carole Civil, MD;  Location: [facility];  Service: Orthopedics;  Laterality: Right;   PERIPHERAL VASCULAR CATHETERIZATION Right 10/22/2016   Procedure: Thrombectomy, thrombolyisis;  Surgeon: [name], MD;  Location: [facility];  Service: Cardiovascular;  Laterality: Right;   PERIPHERAL VASCULAR CATHETERIZATION N/A 10/22/2016   Procedure: IVC Filter Insertion;  Surgeon: [name], MD;  Location: [facility];  Service: Cardiovascular;  Laterality: N/A;   POLYPECTOMY  03/18/2019   Procedure: POLYPECTOMY;  Surgeon: [name], MD;  [facility];  Service: Endoscopy;;     reports that he has been smoking cigarettes. He has been smoking an average of .5 packs per day. He has never used smokeless tobacco. He reports current alcohol use. He reports current drug use. Drug: Marijuana.  Allergies  Allergen Reactions   Bee Venom Swelling   Penicillins Other (See Comments)  PT states that he had symptoms that felt like he was having a heart attack Has patient had a PCN reaction causing immediate rash, facial/tongue/throat swelling, SOB or lightheadedness with hypotension: No Has patient had a PCN reaction causing severe rash involving mucus membranes or skin necrosis: No Has patient had a PCN reaction that required hospitalization No Has patient had a PCN reaction occurring within the last 10 years: No If all of the above  answers are "NO", then may proceed with Cephal   Zofran [Ondansetron] Hives   Other Palpitations    Steroiod   Sulfa Antibiotics Itching    Family History  Problem Relation Age of Onset   Cancer Mother    Heart attack Father    Colon cancer Neg Hx      Prior to Admission medications   Medication Sig Start Date End Date Taking? Authorizing Provider  albuterol (VENTOLIN HFA) 108 (90 Base) MCG/ACT inhaler Inhale 1-2 puffs into the lungs every 6 (six) hours as needed for wheezing or shortness of breath. 01/17/21   Dorie Rank, MD  EPINEPHrine 0.3 mg/0.3 mL IJ SOAJ injection Inject 0.3 mg into the muscle as needed for anaphylaxis (For severe allergic reaction only with difficulty breathing, tongue swelling or throat swelling.). 04/08/21   Rancour, Annie Main, MD  rivaroxaban (XARELTO) 20 MG TABS tablet Take 1 tablet (20 mg total) by mouth daily with breakfast. 04/09/21   Schnier, Dolores Lory, MD    Physical Exam: Vitals:   07/04/21 1949 07/04/21 2000 07/04/21 2015 07/04/21 2030  BP: (!) 144/85 131/77  125/73  Pulse: 93 84 83 78  Resp: '10 14 12 11  '$ Temp: 98.3 F (36.8 C)     TempSrc: Oral     SpO2: 92% 91% 94% 92%    Constitutional: NAD, calm, comfortable Vitals:   07/04/21 1949 07/04/21 2000 07/04/21 2015 07/04/21 2030  BP: (!) 144/85 131/77  125/73  Pulse: 93 84 83 78  Resp: '10 14 12 11  '$ Temp: 98.3 F (36.8 C)     TempSrc: Oral     SpO2: 92% 91% 94% 92%   Eyes: PERRL, lids and conjunctivae normal ENMT: Mucous membranes are dry. Posterior pharynx clear of any exudate or lesions.Normal dentition.  Neck: normal, supple, no masses, no thyromegaly Respiratory: clear to auscultation bilaterally, no wheezing, no crackles. Normal respiratory effort. No accessory muscle use.  Cardiovascular: Regular rate and rhythm, no murmurs / rubs / gallops. No extremity edema. 2+ pedal pulses. No carotid bruits.  Abdomen: no tenderness, no masses palpated. No hepatosplenomegaly. Bowel sounds positive.   Musculoskeletal: no clubbing / cyanosis. No joint deformity upper and lower extremities. Good ROM, no contractures. Normal muscle tone.  Skin: no rashes, lesions, ulcers. No induration Neurologic: CN 2-12 grossly intact. Sensation intact, DTR normal. Strength 5/5 in all 4.  Psychiatric: Normal judgment and insight. Alert and oriented x 3. Normal mood.     Labs on Admission: I have personally reviewed following labs and imaging studies  CBC: Recent Labs  Lab 07/04/21 1955  WBC 11.3*  NEUTROABS 6.4  HGB 15.4  HCT 44.7  MCV 97.6  PLT [id number]   Basic Metabolic Panel: Recent Labs  Lab 07/04/21 1955  NA 139  K 3.1*  CL 104  CO2 27  GLUCOSE 145*  BUN 22*  CREATININE 1.58*  CALCIUM 8.6*   GFR: CrCl cannot be calculated (Unknown ideal weight.). Liver Function Tests: No results for input(s): AST, ALT, ALKPHOS, BILITOT, PROT, ALBUMIN in the last 168 hours.  No results for input(s): LIPASE, AMYLASE in the last 168 hours. No results for input(s): AMMONIA in the last 168 hours. Coagulation Profile: No results for input(s): INR, PROTIME in the last 168 hours. Cardiac Enzymes: No results for input(s): CKTOTAL, CKMB, CKMBINDEX, TROPONINI in the last 168 hours. BNP (last 3 results) No results for input(s): PROBNP in the last 8760 hours. HbA1C: No results for input(s): HGBA1C in the last 72 hours. CBG: Recent Labs  Lab 07/04/21 1947  GLUCAP 158*   Lipid Profile: No results for input(s): CHOL, HDL, LDLCALC, TRIG, CHOLHDL, LDLDIRECT in the last 72 hours. Thyroid Function Tests: No results for input(s): TSH, T4TOTAL, FREET4, T3FREE, THYROIDAB in the last 72 hours. Anemia Panel: No results for input(s): VITAMINB12, FOLATE, FERRITIN, TIBC, IRON, RETICCTPCT in the last 72 hours. Urine analysis:    Component Value Date/Time   COLORURINE YELLOW 06/16/2021 1303   APPEARANCEUR HAZY (A) 06/16/2021 1303   LABSPEC 1.020 06/16/2021 1303   PHURINE 5.0 06/16/2021 1303   GLUCOSEU NEGATIVE  06/16/2021 1303   HGBUR NEGATIVE 06/16/2021 1303   BILIRUBINUR NEGATIVE 06/16/2021 1303   KETONESUR 5 (A) 06/16/2021 1303   PROTEINUR NEGATIVE 06/16/2021 1303   UROBILINOGEN 0.2 12/08/2013 1735   NITRITE NEGATIVE 06/16/2021 1303   LEUKOCYTESUR LARGE (A) 06/16/2021 1303    Radiological Exams on Admission: CT Head Wo Contrast  Result Date: 07/04/2021 CLINICAL DATA:  Head trauma EXAM: CT HEAD WITHOUT CONTRAST TECHNIQUE: Contiguous axial images were obtained from the base of the skull through the vertex without intravenous contrast. COMPARISON:  None. FINDINGS: Brain: No evidence of acute infarction, hemorrhage, hydrocephalus, extra-axial collection or mass lesion/mass effect. Vascular: No hyperdense vessel or unexpected calcification. Skull: Normal. Negative for fracture or focal lesion. Sinuses/Orbits: The visualized paranasal sinuses are essentially clear. The mastoid air cells are unopacified. Other: None. IMPRESSION: Normal head CT. Electronically Signed   By: Julian Hy M.D.   On: 07/04/2021 21:04   DG Chest Port 1 View  Result Date: 07/04/2021 CLINICAL DATA:  Syncope EXAM: PORTABLE CHEST 1 VIEW COMPARISON:  06/16/2021 FINDINGS: The heart size and mediastinal contours are within normal limits. Both lungs are clear. The visualized skeletal structures are unremarkable. IMPRESSION: No active disease. Electronically Signed   By: Ulyses Jarred M.D.   On: 07/04/2021 20:34    EKG: Independently reviewed. Sinus, borderline prolonged QTC prolongation  Assessment/Plan Active Problems:   Syncope  (please populate well all problems here in Problem List. (For example, if patient is on BP meds at home and you resume or decide to hold them, it is a problem that needs to be her. Same for CAD, COPD, HLD and so on)  Syncope -Suspect recurrent bradycardia, ED physician placed a consult cardiology for a interrogation of the Zio Patch. -With baseline borderline prolongation of QTCs, will make K>4,  and check to make Mg >2. -CT head reassuring. -TSH WNL on last admission -Echo already done on last admission.  AKI  -Signs of dehydration, IVF for 12 hours -Then recheck BMP and orthostatic vitals.  Mild intermittent asthma -No acute issue  DVT -CT head negative, will continue Xarelto.  DVT prophylaxis: Xarelto Code Status: Full code Family Communication: Wife over the phone Disposition Plan: Expect less than 2 midnight hospital stay Consults called: Cardiology Admission status: Tele obs   Lequita Halt MD [facility] Pager [phone]  07/04/2021, 9:53 PM

## 2021-07-04 NOTE — Progress Notes (Signed)
VBG showed a normal PH, no significant CO2 retentions.

## 2021-07-04 NOTE — ED Provider Notes (Signed)
[facility] Provider Note   CSN: [id number] Arrival date & time: 07/04/21  1943     History Chief Complaint  Patient presents with   Loss of Consciousness    Lance Cohen is a 50 y.o. male.  Patient presents chief complaint of lapse of consciousness.  He was with his wife when he reportedly became unresponsive.  He appeared blue per EMS.  He was being bagged for about 4 minutes when he spontaneously woke up.  He denies any headache or chest pain or abdominal pain.  He states he is unsure what happened.  Denies any recent illnesses or fevers or cough or vomiting or diarrhea.  Patient denies any recent drug use.      Past Medical History:  Diagnosis Date   Anxiety    Arthritis    Asthma    Bipolar 1 disorder ([place])    Chronic knee pain    Depression    DVT (deep venous thrombosis) ([place])    Hypertension    Insomnia    Sleep apnea    Spinal stenosis    Suicide attempt ([place])    2010 Intentional overdose attempt with Depakote.    Patient Active Problem List   Diagnosis Date Noted   Syncope 07/04/2021   Bradycardia 06/16/2021   Closed fracture of right distal radius and ulna 12/21/2020   Closed fracture of right forearm    Daily headache 11/27/2020   Dizzy spells 11/27/2020   Seizure ([place]) 11/27/2020   Myofascial pain dysfunction syndrome 11/22/2020   Cocaine abuse ([place]) 11/18/2020   Foreign body (FB) in soft tissue 09/08/2020   Chronic low back pain 05/11/2020   Shoulder pain 05/11/2020   Headache 05/11/2020   Cervical radiculopathy 02/18/2020   Impingement syndrome of right shoulder 01/18/2020   Rectal pain 06/29/2019   GERD (gastroesophageal reflux disease) 06/29/2019   Rectal bleeding 02/22/2019   Priapism 02/08/2019   Chest pain 10/19/2017   Asthma 10/19/2017   Hypertension 10/19/2017   Arthralgia of right knee 10/19/2017   Pain in limb 12/27/2016   History of pulmonary embolus (PE) 10/23/2016   Nausea and vomiting 10/23/2016    S/P IVC filter 10/23/2016   Tobacco abuse counseling 10/23/2016   Chronic deep vein thrombosis (DVT) ([place]) 10/18/2016   Cellulitis of foot 06/24/2011   Bipolar 1 disorder ([place]) 06/24/2011   Tobacco abuse 06/24/2011   Polycythemia 06/24/2011   Tinea pedis 06/24/2011   CARPAL TUNNEL SYNDROME, RIGHT 06/21/2010   CONTUSION, LEFT FOOT 06/05/2010   FOREIGN BODY, FOOT 04/17/2010    Past Surgical History:  Procedure Laterality Date   COLONOSCOPY WITH PROPOFOL N/A 03/18/2019   Procedure: COLONOSCOPY WITH PROPOFOL;  Surgeon: [name], MD;  [facility];  Service: Endoscopy;  Laterality: N/A;  2:30pm   FOOT SURGERY     had peice of wire removed   IVC FILTER REMOVAL N/A 02/24/2017   Procedure: IVC Filter Removal;  Surgeon: [name], MD;  Location: [facility];  Service: Cardiovascular;  Laterality: N/A;   ORIF RADIAL FRACTURE Right 12/05/2020   Procedure: OPEN REDUCTION INTERNAL FIXATION (ORIF) RADIAL FRACTURE and ULNA FRACTURE;  Surgeon: Carole Civil, MD;  Location: [facility];  Service: Orthopedics;  Laterality: Right;   PERIPHERAL VASCULAR CATHETERIZATION Right 10/22/2016   Procedure: Thrombectomy, thrombolyisis;  Surgeon: [name], MD;  Location: [facility];  Service: Cardiovascular;  Laterality: Right;   PERIPHERAL VASCULAR CATHETERIZATION N/A 10/22/2016   Procedure: IVC  Filter Insertion;  Surgeon: [name], MD;  Location: [facility];  Service: Cardiovascular;  Laterality: N/A;   POLYPECTOMY  03/18/2019   Procedure: POLYPECTOMY;  Surgeon: [name], MD;  [facility];  Service: Endoscopy;;       Family History  Problem Relation Age of Onset   Cancer Mother    Heart attack Father    Colon cancer Neg Hx     Social History   Tobacco Use   Smoking status: Every Day    Packs/day: 0.50    Types: Cigarettes   Smokeless tobacco: Never  Vaping Use   Vaping Use: Never used  Substance Use Topics   Alcohol  use: Yes    Comment: seldom   Drug use: Yes    Types: Marijuana    Comment: smokes for pain    Home Medications Prior to Admission medications   Medication Sig Start Date End Date Taking? Authorizing Provider  albuterol (VENTOLIN HFA) 108 (90 Base) MCG/ACT inhaler Inhale 1-2 puffs into the lungs every 6 (six) hours as needed for wheezing or shortness of breath. 01/17/21  Yes Dorie Rank, MD  pantoprazole (PROTONIX) 20 MG tablet Take 1 tablet (20 mg total) by mouth daily. 07/05/21 07/05/22 Yes Barton Dubois, MD  rivaroxaban (XARELTO) 20 MG TABS tablet Take 1 tablet (20 mg total) by mouth daily with breakfast. 04/09/21  Yes Schnier, Dolores Lory, MD  EPINEPHrine 0.3 mg/0.3 mL IJ SOAJ injection Inject 0.3 mg into the muscle as needed for anaphylaxis (For severe allergic reaction only with difficulty breathing, tongue swelling or throat swelling.). 04/08/21   Rancour, Annie Main, MD    Allergies    Bee venom, Penicillins, Zofran [ondansetron], Other, and Sulfa antibiotics  Review of Systems   Review of Systems  Constitutional:  Negative for fever.  HENT:  Negative for ear pain and sore throat.   Eyes:  Negative for pain.  Respiratory:  Negative for cough.   Cardiovascular:  Negative for chest pain.  Gastrointestinal:  Negative for abdominal pain.  Genitourinary:  Negative for flank pain.  Musculoskeletal:  Negative for back pain.  Skin:  Negative for color change and rash.  Neurological:  Negative for syncope.  All other systems reviewed and are negative.  Physical Exam Updated Vital Signs BP (!) 95/58   Pulse 75   Temp 98 F (36.7 C) (Oral)   Resp 18   SpO2 98%   Physical Exam Constitutional:      Appearance: He is well-developed.  HENT:     Head: Normocephalic.     Nose: Nose normal.  Eyes:     Extraocular Movements: Extraocular movements intact.  Cardiovascular:     Rate and Rhythm: Normal rate.  Pulmonary:     Effort: Pulmonary effort is normal.  Skin:    Coloration: Skin  is not jaundiced.  Neurological:     General: No focal deficit present.     Mental Status: He is alert and oriented to person, place, and time. Mental status is at baseline.     Cranial Nerves: No cranial nerve deficit.     Motor: No weakness.    ED Results / Procedures / Treatments   Labs (all labs ordered are listed, but only abnormal results are displayed) Labs Reviewed  CBC WITH DIFFERENTIAL/PLATELET - Abnormal; Notable for the following components:      Result Value   WBC 11.3 (*)    All other components within normal limits  BASIC METABOLIC PANEL -  Abnormal; Notable for the following components:   Potassium 3.1 (*)    Glucose, Bld 145 (*)    BUN 22 (*)    Creatinine, Ser 1.58 (*)    Calcium 8.6 (*)    GFR, Estimated 53 (*)    All other components within normal limits  RAPID URINE DRUG SCREEN, HOSP PERFORMED - Abnormal; Notable for the following components:   Cocaine POSITIVE (*)    Tetrahydrocannabinol POSITIVE (*)    All other components within normal limits  TSH - Abnormal; Notable for the following components:   TSH 4.875 (*)    All other components within normal limits  BLOOD GAS, VENOUS - Abnormal; Notable for the following components:   pCO2, Ven 65.3 (*)    pO2, Ven <31.0 (*)    Acid-Base Excess 4.0 (*)    All other components within normal limits  BASIC METABOLIC PANEL - Abnormal; Notable for the following components:   Calcium 8.5 (*)    All other components within normal limits  CBG MONITORING, ED - Abnormal; Notable for the following components:   Glucose-Capillary 158 (*)    All other components within normal limits  RESP PANEL BY RT-PCR (FLU A&B, COVID) ARPGX2  MAGNESIUM  CBG MONITORING, ED  TROPONIN I (HIGH SENSITIVITY)  TROPONIN I (HIGH SENSITIVITY)    EKG EKG Interpretation  Date/Time:  Wednesday July 04 2021 19:45:56 EDT Ventricular Rate:  97 PR Interval:  155 QRS Duration: 107 QT Interval:  384 QTC Calculation: 488 R  Axis:   17 Text Interpretation: Sinus rhythm Borderline prolonged QT interval Confirmed by Thamas Jaegers ([id number]) on 07/04/2021 9:17:56 PM  Radiology No results found.  Procedures .Critical Care Performed by: Luna Fuse, MD Authorized by: Luna Fuse, MD   Critical care provider statement:    Critical care time (minutes):  30   Critical care time was exclusive of:  Separately billable procedures and treating other patients and teaching time   Critical care was necessary to treat or prevent imminent or life-threatening deterioration of the following conditions:  Cardiac failure and metabolic crisis   Medications Ordered in ED Medications  0.9 %  sodium chloride infusion (0 mLs Intravenous Stopped 07/05/21 1338)  potassium chloride SA (KLOR-CON) CR tablet 40 mEq (40 mEq Oral Given 07/04/21 2147)  calcium gluconate 1 g/ 50 mL sodium chloride IVPB (0 mg Intravenous Stopped 07/04/21 2255)  potassium chloride SA (KLOR-CON) CR tablet 40 mEq (40 mEq Oral Given 07/04/21 2300)    ED Course  I have reviewed the triage vital signs and the nursing notes.  Pertinent labs & imaging results that were available during my care of the patient were reviewed by me and considered in my medical decision making (see chart for details).    MDM Rules/Calculators/A&P                           Labs show low potassium low calcium.  Patient's chart indicates recent visits for bradycardia with a cardiac monitor that was placed.  Given presentation today with brought in for further observation.   Final Clinical Impression(s) / ED Diagnoses Final diagnoses:  Syncope and collapse  Hypokalemia  Hypocalcemia    Rx / DC Orders ED Discharge Orders          Ordered    pantoprazole (PROTONIX) 20 MG tablet  Daily        07/05/21 1251    Increase activity slowly  07/05/21 1251    Diet - low sodium heart healthy        07/05/21 1251    Discharge instructions       Comments: Arrange follow up  with PCP in 10 days Stop the use of recreational drugs Follow heart healthy/low sodium diet as discussed Maintain adequate hydration Follow up with cardiology service.   07/05/21 1251             Luna Fuse, MD 07/07/21 1728

## 2021-07-04 NOTE — ED Triage Notes (Signed)
Pt arrived via [facility]. Pt loss of consciousness at home. When EMS arrived on site pt was cyanotic with agonal respirations. Pt was bagged for approx 4 min. Pt arouses to painful stimuli.

## 2021-07-05 ENCOUNTER — Other Ambulatory Visit: Payer: Self-pay | Admitting: Student

## 2021-07-05 ENCOUNTER — Ambulatory Visit (INDEPENDENT_AMBULATORY_CARE_PROVIDER_SITE_OTHER): Payer: Medicaid Other

## 2021-07-05 DIAGNOSIS — R55 Syncope and collapse: Secondary | ICD-10-CM

## 2021-07-05 LAB — BASIC METABOLIC PANEL
Anion gap: 10 (ref 5–15)
BUN: 19 mg/dL (ref 6–20)
CO2: 22 mmol/L (ref 22–32)
Calcium: 8.5 mg/dL — ABNORMAL LOW (ref 8.9–10.3)
Chloride: 106 mmol/L (ref 98–111)
Creatinine, Ser: 1.14 mg/dL (ref 0.61–1.24)
GFR, Estimated: >60 mL/min (ref >60)
Glucose, Bld: 90 mg/dL (ref 70–99)
Potassium: 4.1 mmol/L (ref 3.5–5.1)
Sodium: 138 mmol/L (ref 135–145)

## 2021-07-05 LAB — RAPID URINE DRUG SCREEN, HOSP PERFORMED
Amphetamines: NOT DETECTED
Barbiturates: NOT DETECTED
Benzodiazepines: NOT DETECTED
Cocaine: POSITIVE — AB
Opiates: NOT DETECTED
Tetrahydrocannabinol: POSITIVE — AB

## 2021-07-05 LAB — CBG MONITORING, ED: Glucose-Capillary: 96 mg/dL (ref 70–99)

## 2021-07-05 MED ORDER — PANTOPRAZOLE SODIUM 20 MG PO TBEC: 20.0000 mg | DELAYED_RELEASE_TABLET | Freq: Every day | ORAL | 1 refills | Status: DC

## 2021-07-05 NOTE — ED Notes (Signed)
Pt called RN in room, pt yelling stating he was told he was being discharged and is threatening to walk out; Dr. Dyann Kief notified and states he is on his way to see pt; pt informed physician is on way to see him but pt still stating he is going to walk out

## 2021-07-05 NOTE — Progress Notes (Signed)
   The patient was admitted for atypical chest pain last month and was found to have intermittent bradycardia with heart rate in the 30's to 40's at times. His pain was atypical as it lasted for over 36 hours and prior Coronary CT in 2018 showed no significant disease. Echocardiogram showed no significant abnormalities and he was discharged home with plans for a Zio patch.  He presents with recurrent syncope and Cardiology was consulted to interrogate his Zio patch. Unfortunately, he was not on a live Zio monitor and this cannot be interrogated until it is mailed back. He actually discontinued wearing his monitor earlier this week due to the monitor falling off and a monitor is currently in the process of being shipped to his home. I spoke with Dr. Dyann Kief and we will arrange for a monitor to be placed while he is in the ED. Of note, his UDS this admission was also positive for cocaine and the importance of cessation of this was going to be reviewed with the patient by the admitting team. Outpatient Cardiology follow-up will be arranged once his monitor has resulted.   Signed, Erma Heritage, PA-C 07/05/2021, 9:38 AM

## 2021-07-05 NOTE — ED Notes (Signed)
Dr. Dyann Kief in to see pt

## 2021-07-05 NOTE — Discharge Summary (Signed)
Physician Discharge Summary  Lance Cohen [id number] DOB: Mar 20, 1971 DOA: 07/04/2021  PCP: [name], NP  Admit date: 07/04/2021 Discharge date: 07/05/2021  Time spent: 30 minutes  Recommendations for Outpatient Follow-up:  Repeat basic metabolic panel to evaluate lites and renal function Patient patient has follow-up with cardiology service for further evaluation and management after event monitoring is completed Repeat basic metabolic panel to follow electrolytes and renal function.  Discharge Diagnoses:  Active Problems:   Syncope -Acute kidney injury -Hypokalemia -History of DVT -Gastroesophageal reflux disease -Tobacco abuse/marijuana use.  Discharge Condition: Stable and improved.  Discharged home with instruction to follow-up with PCP and cardiology service.  CODE STATUS: Full code.  Diet recommendation: Heart healthy diet.   History of present illness:  As per H&P written by Dr. Roosevelt Locks on 07/04/2021 Lance Cohen is a 50 y.o. male with medical history significant of mild intermittent asthma, DVT on Xarelto, anxiety/depression, came with syncope.   Patient was recently hospitalized for chest pain episode when it was found his heart rate sometime can go bradycardia as 30-40s, no heart blockage seen. Before today, patient only has a remote history of near syncope in his past. Patient was started on Zio patch 6 days ago and has been feeling fine. This afternoon, wife found patient altered mention compared to normal about 5 minutes ago. "Color turned blue and breathing very slow". EMS came and found patient in bradycardia and agonal breathing and bagged him for 4-5 min, but no narcan given, and patient resumed normal breathing, but remained sleepy. Patient does no recall anything other than he was feeling "about to throw up" some time before the episode. No pain, no loss control of urine or BM, no fever or chills, no headache. Use marijuana everyday.   ED  Course: No bradycardia or hypotension, no hypoxia. Head CT negative and chest xray unremarkable. K=3.1, Cr 1.5.  Hospital Course:  1-syncope -With concern for vagal syncope event versus recurrent bradycardia -Patient also positive for recreational drugs (he is presently using marijuana and may be receiving a bad batch; denies use of cocaine). -Borderline QT prolongation appreciated -Electrolytes were repleted and fluid resuscitation provided -Patient orthostatic vital signs negative -Case discussed with cardiology service who recommended outpatient follow-up and event monitoring. -TSH within normal limits on recent admission; echocardiogram with preserved ejection fraction and no significant valvular disorder. -Patient safe to be discharged and follow-up as an outpatient with PCP and cardiology service.  2-acute kidney injury -In the setting of dehydration and prerenal azotemia -Continue to minimize the use of nephrotoxic agents -Patient advised to maintain adequate hydration -A follow-up visit please repeat basic metabolic panel to follow electrolytes and renal function and stability.  3-history of intermittent asthma -Mild in general -No wheezing, no shortness of breath, no tachypnea. -Continue as needed bronchodilators.  4-gastroesophageal reflux disease -Continue PPI.  5-history of DVT -Continue the use of Xarelto.  6-tobacco abuse/marijuana use -Cessation counseling provided. -UDS found to be positive for cocaine; patient expressed only using marijuana and very likely receiving about batch.  He denies the use of cocaine on regular basis.  Procedures: See below for x-ray reports.  Consultations: Cardiology   Discharge Exam: Vitals:   07/05/21 0929 07/05/21 0939  BP: (!) 89/51 (!) 95/58  Pulse: (!) 49 75  Resp: 11 18  Temp:    SpO2: 100% 98%    General: Afebrile, no chest pain, no nausea, no vomiting, no shortness of breath.  Reports no further episodes of  syncope. Cardiovascular: Sinus rhythm; intermittent episodes of sinus bradycardia appreciated on telemetry, no rubs, no gallops, no JVD on exam. Respiratory: Clear to auscultation bilaterally; good saturation on room air.  No using accessory muscle. Abdomen: Soft, nontender, nondistended, positive bowel sounds Extremities: No cyanosis or clubbing.  Discharge Instructions   Discharge Instructions     Diet - low sodium heart healthy   Complete by: As directed    Discharge instructions   Complete by: As directed    Arrange follow up with PCP in 10 days Stop the use of recreational drugs Follow heart healthy/low sodium diet as discussed Maintain adequate hydration Follow up with cardiology service.   Increase activity slowly   Complete by: As directed       Allergies as of 07/05/2021       Reactions   Bee Venom Swelling   Penicillins Other (See Comments)   PT states that he had symptoms that felt like he was having a heart attack Has patient had a PCN reaction causing immediate rash, facial/tongue/throat swelling, SOB or lightheadedness with hypotension: No Has patient had a PCN reaction causing severe rash involving mucus membranes or skin necrosis: No Has patient had a PCN reaction that required hospitalization No Has patient had a PCN reaction occurring within the last 10 years: No If all of the above answers are "NO", then may proceed with Cephal   Zofran [ondansetron] Hives   Other Palpitations   Steroiod   Sulfa Antibiotics Itching        Medication List     TAKE these medications    albuterol 108 (90 Base) MCG/ACT inhaler Commonly known as: VENTOLIN HFA Inhale 1-2 puffs into the lungs every 6 (six) hours as needed for wheezing or shortness of breath.   EPINEPHrine 0.3 mg/0.3 mL Soaj injection Commonly known as: EPI-PEN Inject 0.3 mg into the muscle as needed for anaphylaxis (For severe allergic reaction only with difficulty breathing, tongue swelling or throat  swelling.).   pantoprazole 20 MG tablet Commonly known as: Protonix Take 1 tablet (20 mg total) by mouth daily.   rivaroxaban 20 MG Tabs tablet Commonly known as: XARELTO Take 1 tablet (20 mg total) by mouth daily with breakfast.       Allergies  Allergen Reactions   Bee Venom Swelling   Penicillins Other (See Comments)    PT states that he had symptoms that felt like he was having a heart attack Has patient had a PCN reaction causing immediate rash, facial/tongue/throat swelling, SOB or lightheadedness with hypotension: No Has patient had a PCN reaction causing severe rash involving mucus membranes or skin necrosis: No Has patient had a PCN reaction that required hospitalization No Has patient had a PCN reaction occurring within the last 10 years: No If all of the above answers are "NO", then may proceed with Cephal   Zofran [Ondansetron] Hives   Other Palpitations    Steroiod   Sulfa Antibiotics Itching    Follow-up Information     Dunn, Dayna N, PA-C Follow up on 07/31/2021.   Specialties: Cardiology, Radiology Why: Cardiology Hospital Follow-up on 07/31/2021 at 3:00 PM. Contact information: [place] [address] [phone]         Nsumanganyi, [name], NP. Schedule an appointment as soon as possible for a visit in 10 day(s).   Specialty: Adult Health Nurse Practitioner Contact information: [address] [place] [address] [phone]  The results of significant diagnostics from this hospitalization (including imaging, microbiology, ancillary and laboratory) are listed below for reference.    Significant Diagnostic Studies: CT Head Wo Contrast  Result Date: 07/04/2021 CLINICAL DATA:  Head trauma EXAM: CT HEAD WITHOUT CONTRAST TECHNIQUE: Contiguous axial images were obtained from the base of the skull through the vertex without intravenous contrast. COMPARISON:  None. FINDINGS: Brain: No evidence of  acute infarction, hemorrhage, hydrocephalus, extra-axial collection or mass lesion/mass effect. Vascular: No hyperdense vessel or unexpected calcification. Skull: Normal. Negative for fracture or focal lesion. Sinuses/Orbits: The visualized paranasal sinuses are essentially clear. The mastoid air cells are unopacified. Other: None. IMPRESSION: Normal head CT. Electronically Signed   By: Julian Hy M.D.   On: 07/04/2021 21:04   CT Angio Chest PE W and/or Wo Contrast  Result Date: 06/16/2021 CLINICAL DATA:  PE suspected, high prob Chest pain off xarelto for 1 month. progressively getting worse, nausea and vomiting, bradycardia-heart rate dropping to 38. EXAM: CT ANGIOGRAPHY CHEST WITH CONTRAST TECHNIQUE: Multidetector CT imaging of the chest was performed using the standard protocol during bolus administration of intravenous contrast. Multiplanar CT image reconstructions and MIPs were obtained to evaluate the vascular anatomy. CONTRAST:  131m OMNIPAQUE IOHEXOL 350 MG/ML SOLN COMPARISON:  None. FINDINGS: Cardiovascular: Satisfactory opacification of the pulmonary arteries to the segmental level. No evidence of pulmonary embolism. The main pulmonary artery is normal in caliber. Normal heart size. No significant pericardial effusion. The thoracic aorta is normal in caliber. No atherosclerotic plaque of the thoracic aorta. Query left anterior descending coronary artery calcifications. Mediastinum/Nodes: No enlarged mediastinal, hilar, or axillary lymph nodes. Trace debris within the trachea. Otherwise thyroid gland, trachea, and esophagus demonstrate no significant findings. Lungs/Pleura: Bilateral lower lobe subsegmental atelectasis. No focal consolidation. Few stable scattered pulmonary micronodules. No new pulmonary nodule. No pulmonary mass. No pleural effusion. No pneumothorax. Upper Abdomen: No acute abnormality. Musculoskeletal: No chest wall abnormality. No suspicious lytic or blastic osseous lesions.  No acute displaced fracture. Multilevel degenerative changes of the spine. Review of the MIP images confirms the above findings. IMPRESSION: 1. No pulmonary embolus. 2. No acute intrathoracic abnormality. Electronically Signed   By: MIven FinnM.D.   On: 06/16/2021 17:17   DG Chest Port 1 View  Result Date: 07/04/2021 CLINICAL DATA:  Syncope EXAM: PORTABLE CHEST 1 VIEW COMPARISON:  06/16/2021 FINDINGS: The heart size and mediastinal contours are within normal limits. Both lungs are clear. The visualized skeletal structures are unremarkable. IMPRESSION: No active disease. Electronically Signed   By: KUlyses JarredM.D.   On: 07/04/2021 20:34   DG Chest Portable 1 View  Result Date: 06/16/2021 CLINICAL DATA:  Chest pain. EXAM: PORTABLE CHEST 1 VIEW COMPARISON:  January 17, 2021. FINDINGS: The heart size and mediastinal contours are within normal limits. Both lungs are clear. The visualized skeletal structures are unremarkable. IMPRESSION: No active disease. Electronically Signed   By: JMarijo ConceptionM.D.   On: 06/16/2021 13:33   ECHOCARDIOGRAM COMPLETE  Result Date: 06/17/2021    ECHOCARDIOGRAM REPORT   Patient Name:   MAKIHIRO COGBURNDate of Exam: 06/17/2021 Medical Rec #:  [id number]      Height:       71.0 in Accession #:    [id number]     Weight:       214.3 lb Date of Birth:  5September 08, 1972      BSA:          2.171 m Patient Age:  50 years        BP:           111/57 mmHg Patient Gender: M               HR:           56 bpm. Exam Location:  Inpatient Procedure: 2D Echo, Cardiac Doppler and Color Doppler Indications:    R94.31 Abnormal EKG  History:        Patient has no prior history of Echocardiogram examinations.                 Risk Factors:Hypertension.  Sonographer:    Bernadene Person RDCS Referring Phys: [place]  1. Left ventricular ejection fraction, by estimation, is 55%. The left ventricle has normal function. The left ventricle has no regional wall motion  abnormalities. Left ventricular diastolic parameters were normal.  2. Right ventricular systolic function is normal. The right ventricular size is normal. Tricuspid regurgitation signal is inadequate for assessing PA pressure.  3. The mitral valve is normal in structure. Trivial mitral valve regurgitation. No evidence of mitral stenosis.  4. The aortic valve is normal in structure. Aortic valve regurgitation is not visualized. No aortic stenosis is present.  5. Aortic dilatation noted. There is borderline dilatation of the aortic root, measuring 37 mm.  6. The inferior vena cava is normal in size with greater than 50% respiratory variability, suggesting right atrial pressure of 3 mmHg. FINDINGS  Left Ventricle: Left ventricular ejection fraction, by estimation, is 55 to 60%. The left ventricle has normal function. The left ventricle has no regional wall motion abnormalities. The left ventricular internal cavity size was normal in size. There is  no left ventricular hypertrophy. Left ventricular diastolic parameters were normal. Normal left ventricular filling pressure. Right Ventricle: The right ventricular size is normal. No increase in right ventricular wall thickness. Right ventricular systolic function is normal. Tricuspid regurgitation signal is inadequate for assessing PA pressure. Left Atrium: Left atrial size was normal in size. Right Atrium: Right atrial size was normal in size. Pericardium: There is no evidence of pericardial effusion. Mitral Valve: The mitral valve is normal in structure. Trivial mitral valve regurgitation. No evidence of mitral valve stenosis. Tricuspid Valve: The tricuspid valve is normal in structure. Tricuspid valve regurgitation is not demonstrated. No evidence of tricuspid stenosis. Aortic Valve: The aortic valve is normal in structure. Aortic valve regurgitation is not visualized. No aortic stenosis is present. Pulmonic Valve: The pulmonic valve was normal in structure. Pulmonic  valve regurgitation is not visualized. No evidence of pulmonic stenosis. Aorta: Aortic dilatation noted. There is borderline dilatation of the aortic root, measuring 37 mm. Venous: The inferior vena cava is normal in size with greater than 50% respiratory variability, suggesting right atrial pressure of 3 mmHg. IAS/Shunts: No atrial level shunt detected by color flow Doppler.  LEFT VENTRICLE PLAX 2D LVIDd:         4.90 cm      Diastology LVIDs:         2.80 cm      LV e' medial:    10.90 cm/s LV PW:         0.80 cm      LV E/e' medial:  4.4 LV IVS:        0.90 cm      LV e' lateral:   16.60 cm/s LVOT diam:     2.20 cm      LV E/e' lateral:  2.9 LV SV:         78 LV SV Index:   36 LVOT Area:     3.80 cm  LV Volumes (MOD) LV vol d, MOD A2C: 88.8 ml LV vol d, MOD A4C: 126.0 ml LV vol s, MOD A2C: 42.2 ml LV vol s, MOD A4C: 51.6 ml LV SV MOD A2C:     46.6 ml LV SV MOD A4C:     126.0 ml LV SV MOD BP:      58.9 ml RIGHT VENTRICLE RV S prime:     12.70 cm/s TAPSE (M-mode): 1.6 cm LEFT ATRIUM             Index       RIGHT ATRIUM           Index LA diam:        3.50 cm 1.61 cm/m  RA Area:     13.90 cm LA Vol (A2C):   42.0 ml 19.34 ml/m RA Volume:   30.10 ml  13.86 ml/m LA Vol (A4C):   39.7 ml 18.28 ml/m LA Biplane Vol: 45.4 ml 20.91 ml/m  AORTIC VALVE LVOT Vmax:   93.40 cm/s LVOT Vmean:  56.800 cm/s LVOT VTI:    0.206 m  AORTA Ao Root diam: 3.70 cm Ao Asc diam:  3.50 cm MITRAL VALVE MV Area (PHT): 2.76 cm    SHUNTS MV Decel Time: 275 msec    Systemic VTI:  0.21 m MV E velocity: 47.60 cm/s  Systemic Diam: 2.20 cm MV A velocity: 36.80 cm/s MV E/A ratio:  1.29 Fransico Him MD Electronically signed by Fransico Him MD Signature Date/Time: 06/17/2021/10:47:55 AM    Final     Microbiology: Recent Results (from the past 240 hour(s))  Resp Panel by RT-PCR (Flu A&B, Covid) Nasopharyngeal Swab     Status: None   Collection Time: 07/04/21  7:56 PM   Specimen: Nasopharyngeal Swab; Nasopharyngeal(NP) swabs in vial transport  medium  Result Value Ref Range Status   SARS Coronavirus 2 by RT PCR NEGATIVE NEGATIVE Final    Comment: (NOTE) SARS-CoV-2 target nucleic acids are NOT DETECTED.  The SARS-CoV-2 RNA is generally detectable in upper respiratory specimens during the acute phase of infection. The lowest concentration of SARS-CoV-2 viral copies this assay can detect is 138 copies/mL. A negative result does not preclude SARS-Cov-2 infection and should not be used as the sole basis for treatment or other patient management decisions. A negative result may occur with  improper specimen collection/handling, submission of specimen other than nasopharyngeal swab, presence of viral mutation(s) within the areas targeted by this assay, and inadequate number of viral copies(<138 copies/mL). A negative result must be combined with clinical observations, patient history, and epidemiological information. The expected result is Negative.  Fact Sheet for Patients:  EntrepreneurPulse.com.au  Fact Sheet for Healthcare Providers:  IncredibleEmployment.be  This test is no t yet approved or cleared by the [place] FDA and  has been authorized for detection and/or diagnosis of SARS-CoV-2 by FDA under an Emergency Use Authorization (EUA). This EUA will remain  in effect (meaning this test can be used) for the duration of the COVID-19 declaration under Section 564(b)(1) of the Act, 21 U.S.C.section 360bbb-3(b)(1), unless the authorization is terminated  or revoked sooner.       Influenza A by PCR NEGATIVE NEGATIVE Final   Influenza B by PCR NEGATIVE NEGATIVE Final    Comment: (NOTE) The Xpert Xpress SARS-CoV-2/FLU/RSV plus assay is intended as an aid  in the diagnosis of influenza from Nasopharyngeal swab specimens and should not be used as a sole basis for treatment. Nasal washings and aspirates are unacceptable for Xpert Xpress SARS-CoV-2/FLU/RSV testing.  Fact Sheet for  Patients: EntrepreneurPulse.com.au  Fact Sheet for Healthcare Providers: IncredibleEmployment.be  This test is not yet approved or cleared by the [place] FDA and has been authorized for detection and/or diagnosis of SARS-CoV-2 by FDA under an Emergency Use Authorization (EUA). This EUA will remain in effect (meaning this test can be used) for the duration of the COVID-19 declaration under Section 564(b)(1) of the Act, 21 U.S.C. section 360bbb-3(b)(1), unless the authorization is terminated or revoked.  Performed at [facility], [address] [place], [place] [address]      Labs: Basic Metabolic Panel: Recent Labs  Lab 07/04/21 1955 07/04/21 2151 07/05/21 0416  NA 139  --  138  K 3.1*  --  4.1  CL 104  --  106  CO2 27  --  22  GLUCOSE 145*  --  90  BUN 22*  --  19  CREATININE 1.58*  --  1.14  CALCIUM 8.6*  --  8.5*  MG  --  2.1  --    CBC: Recent Labs  Lab 07/04/21 1955  WBC 11.3*  NEUTROABS 6.4  HGB 15.4  HCT 44.7  MCV 97.6  PLT 289    CBG: Recent Labs  Lab 07/04/21 1947 07/05/21 0515  GLUCAP 158* 96    Signed:  Barton Dubois MD.  [facility] 07/05/2021, 12:53 PM

## 2021-07-05 NOTE — ED Notes (Signed)
Family given update 

## 2021-07-05 NOTE — ED Notes (Signed)
[facility] in to replace outpatient heart monitor.

## 2021-07-08 ENCOUNTER — Emergency Department (HOSPITAL_COMMUNITY)
Admission: EM | Admit: 2021-07-08 | Discharge: 2021-07-08 | Disposition: A | Discharge status: Home / Self Care | Payer: Medicaid Other | Attending: Emergency Medicine | Admitting: Emergency Medicine

## 2021-07-08 ENCOUNTER — Emergency Department (HOSPITAL_COMMUNITY): Payer: Medicaid Other

## 2021-07-08 ENCOUNTER — Other Ambulatory Visit: Payer: Self-pay

## 2021-07-08 ENCOUNTER — Encounter (HOSPITAL_COMMUNITY): Payer: Self-pay | Admitting: *Deleted

## 2021-07-08 DIAGNOSIS — N201 Calculus of ureter: Secondary | ICD-10-CM | POA: Insufficient documentation

## 2021-07-08 DIAGNOSIS — Z7901 Long term (current) use of anticoagulants: Secondary | ICD-10-CM | POA: Insufficient documentation

## 2021-07-08 DIAGNOSIS — I10 Essential (primary) hypertension: Secondary | ICD-10-CM | POA: Diagnosis not present

## 2021-07-08 DIAGNOSIS — R109 Unspecified abdominal pain: Secondary | ICD-10-CM | POA: Diagnosis present

## 2021-07-08 DIAGNOSIS — J45909 Unspecified asthma, uncomplicated: Secondary | ICD-10-CM | POA: Diagnosis not present

## 2021-07-08 DIAGNOSIS — Z79899 Other long term (current) drug therapy: Secondary | ICD-10-CM | POA: Insufficient documentation

## 2021-07-08 DIAGNOSIS — F17210 Nicotine dependence, cigarettes, uncomplicated: Secondary | ICD-10-CM | POA: Insufficient documentation

## 2021-07-08 LAB — COMPREHENSIVE METABOLIC PANEL
ALT: 26 U/L (ref 0–44)
AST: 30 U/L (ref 15–41)
Albumin: 4.4 g/dL (ref 3.5–5.0)
Alkaline Phosphatase: 80 U/L (ref 38–126)
Anion gap: 7 (ref 5–15)
BUN: 13 mg/dL (ref 6–20)
CO2: 30 mmol/L (ref 22–32)
Calcium: 9 mg/dL (ref 8.9–10.3)
Chloride: 99 mmol/L (ref 98–111)
Creatinine, Ser: 1.25 mg/dL — ABNORMAL HIGH (ref 0.61–1.24)
GFR, Estimated: >60 mL/min (ref >60)
Glucose, Bld: 110 mg/dL — ABNORMAL HIGH (ref 70–99)
Potassium: 4 mmol/L (ref 3.5–5.1)
Sodium: 136 mmol/L (ref 135–145)
Total Bilirubin: 0.7 mg/dL (ref 0.3–1.2)
Total Protein: 8 g/dL (ref 6.5–8.1)

## 2021-07-08 LAB — CBC WITH DIFFERENTIAL/PLATELET
Abs Immature Granulocytes: 0.03 10*3/uL (ref 0.00–0.07)
Basophils Absolute: 0.1 10*3/uL (ref 0.0–0.1)
Basophils Relative: 1 %
Eosinophils Absolute: 0.2 10*3/uL (ref 0.0–0.5)
Eosinophils Relative: 2 %
HCT: 48.6 % (ref 39.0–52.0)
Hemoglobin: 16.6 g/dL (ref 13.0–17.0)
Immature Granulocytes: 0 %
Lymphocytes Relative: 20 %
Lymphs Abs: 2 10*3/uL (ref 0.7–4.0)
MCH: 33.3 pg (ref 26.0–34.0)
MCHC: 34.2 g/dL (ref 30.0–36.0)
MCV: 97.4 fL (ref 80.0–100.0)
Monocytes Absolute: 0.9 10*3/uL (ref 0.1–1.0)
Monocytes Relative: 9 %
Neutro Abs: 6.7 10*3/uL (ref 1.7–7.7)
Neutrophils Relative %: 68 %
Platelets: 296 10*3/uL (ref 150–400)
RBC: 4.99 MIL/uL (ref 4.22–5.81)
RDW: 11.7 % (ref 11.5–15.5)
WBC: 9.9 10*3/uL (ref 4.0–10.5)
nRBC: 0 % (ref 0.0–0.2)

## 2021-07-08 LAB — URINALYSIS, ROUTINE W REFLEX MICROSCOPIC
Glucose, UA: NEGATIVE mg/dL
Ketones, ur: 5 mg/dL — AB
Nitrite: NEGATIVE
Protein, ur: >=300 mg/dL — AB
RBC / HPF: >50 RBC/hpf — ABNORMAL HIGH (ref 0–5)
Specific Gravity, Urine: 1.029 (ref 1.005–1.030)
WBC, UA: >50 WBC/hpf — ABNORMAL HIGH (ref 0–5)
pH: 6 (ref 5.0–8.0)

## 2021-07-08 MED ORDER — OXYCODONE-ACETAMINOPHEN 5-325 MG PO TABS
1.0000 | ORAL_TABLET | Freq: Once | ORAL | Status: AC
  Administered 2021-07-08: 1 via ORAL
  Filled 2021-07-08: qty 1

## 2021-07-08 MED ORDER — KETOROLAC TROMETHAMINE 30 MG/ML IJ SOLN
30.0000 mg | Freq: Once | INTRAMUSCULAR | Status: AC
  Administered 2021-07-08: 30 mg via INTRAMUSCULAR
  Filled 2021-07-08: qty 1

## 2021-07-08 MED ORDER — OXYCODONE-ACETAMINOPHEN 5-325 MG PO TABS: 1.0000 | ORAL_TABLET | Freq: Three times a day (TID) | ORAL | 0 refills | Status: AC | PRN

## 2021-07-08 MED ORDER — TAMSULOSIN HCL 0.4 MG PO CAPS: 0.4000 mg | ORAL_CAPSULE | Freq: Every day | ORAL | 0 refills | Status: AC

## 2021-07-08 NOTE — ED Triage Notes (Signed)
Pt with groin pain for past 2 days, not able to urinate as normal

## 2021-07-08 NOTE — Discharge Instructions (Addendum)
You have a small 1 mm stone in your right ureter that should pass within  the next couple of days.  Recommend staying hydrated.   Started you on  Flomax will help with dilating your ureter to pass your stone this medication can make you dizzy if you start to become dizzy please discontinue this medication.I have given you a short course of narcotics please take as prescribed.  This medication can make you drowsy do not consume alcohol or operate heavy machinery when taking this medication.  This medication is Tylenol in it do not take Tylenol and take this medication.  I   Please follow-up with urology for further evaluation.   Come back to the emergency department if you develop chest pain, shortness of breath, severe abdominal pain, uncontrolled nausea, vomiting, diarrhea.

## 2021-07-08 NOTE — ED Provider Notes (Signed)
[facility] EMERGENCY DEPARTMENT Provider Note   CSN: [id number] Arrival date & time: 07/08/21  1405     History Chief Complaint  Patient presents with   Groin Pain    Lance Cohen is a 50 y.o. male.  HPI  Patient with significant medical history of anxiety, bipolar, DVT presents to the emergency department with chief complaint of difficulty with urination.  Patient states since the 14th he has been having difficulty starting to urinate, states it only comes out in a dribble.  States he is now starting developed some pain around his suprapubic region, pain does not radiate, he has no associated stomach pain, nausea, vomiting, diarrhea, he denies  flank pain, states he is never had this in the past, no history of kidney stones, prostate enlargement, he denies alleviating or aggravating factors.  Patient denies penile discharge, testicular pain, dysuria, hematuria.  Patient denies chest pain, shortness of breath,worsening pedal edema.  Past Medical History:  Diagnosis Date   Anxiety    Arthritis    Asthma    Bipolar 1 disorder ([place])    Chronic knee pain    Depression    DVT (deep venous thrombosis) ([place])    Hypertension    Insomnia    Sleep apnea    Spinal stenosis    Suicide attempt ([place])    2010 Intentional overdose attempt with Depakote.    Patient Active Problem List   Diagnosis Date Noted   Syncope 07/04/2021   Bradycardia 06/16/2021   Closed fracture of right distal radius and ulna 12/21/2020   Closed fracture of right forearm    Daily headache 11/27/2020   Dizzy spells 11/27/2020   Seizure ([place]) 11/27/2020   Myofascial pain dysfunction syndrome 11/22/2020   Cocaine abuse ([place]) 11/18/2020   Foreign body (FB) in soft tissue 09/08/2020   Chronic low back pain 05/11/2020   Shoulder pain 05/11/2020   Headache 05/11/2020   Cervical radiculopathy 02/18/2020   Impingement syndrome of right shoulder 01/18/2020   Rectal pain 06/29/2019   GERD (gastroesophageal  reflux disease) 06/29/2019   Rectal bleeding 02/22/2019   Priapism 02/08/2019   Chest pain 10/19/2017   Asthma 10/19/2017   Hypertension 10/19/2017   Arthralgia of right knee 10/19/2017   Pain in limb 12/27/2016   History of pulmonary embolus (PE) 10/23/2016   Nausea and vomiting 10/23/2016   S/P IVC filter 10/23/2016   Tobacco abuse counseling 10/23/2016   Chronic deep vein thrombosis (DVT) ([place]) 10/18/2016   Cellulitis of foot 06/24/2011   Bipolar 1 disorder ([place]) 06/24/2011   Tobacco abuse 06/24/2011   Polycythemia 06/24/2011   Tinea pedis 06/24/2011   CARPAL TUNNEL SYNDROME, RIGHT 06/21/2010   CONTUSION, LEFT FOOT 06/05/2010   FOREIGN BODY, FOOT 04/17/2010    Past Surgical History:  Procedure Laterality Date   COLONOSCOPY WITH PROPOFOL N/A 03/18/2019   Procedure: COLONOSCOPY WITH PROPOFOL;  Surgeon: [name], MD;  Location: [facility];  Service: Endoscopy;  Laterality: N/A;  2:30pm   FOOT SURGERY     had peice of wire removed   IVC FILTER REMOVAL N/A 02/24/2017   Procedure: IVC Filter Removal;  Surgeon: [name], MD;  Location: [place] [facility];  Service: Cardiovascular;  Laterality: N/A;   ORIF RADIAL FRACTURE Right 12/05/2020   Procedure: OPEN REDUCTION INTERNAL FIXATION (ORIF) RADIAL FRACTURE and ULNA FRACTURE;  Surgeon: Carole Civil, MD;  Location: [facility] OR[facility];  Service: Orthopedics;  Laterality: Right;   PERIPHERAL VASCULAR CATHETERIZATION Right 10/22/2016  Procedure: Thrombectomy, thrombolyisis;  Surgeon: [name], MD;  Location: [place] [facility];  Service: Cardiovascular;  Laterality: Right;   PERIPHERAL VASCULAR CATHETERIZATION N/A 10/22/2016   Procedure: IVC Filter Insertion;  Surgeon: [name], MD;  Location: [place] [facility];  Service: Cardiovascular;  Laterality: N/A;   POLYPECTOMY  03/18/2019   Procedure: POLYPECTOMY;  Surgeon: [name], MD;  Location: [facility];  Service: Endoscopy;;       Family History   Problem Relation Age of Onset   Cancer Mother    Heart attack Father    Colon cancer Neg Hx     Social History   Tobacco Use   Smoking status: Every Day    Packs/day: 0.50    Types: Cigarettes   Smokeless tobacco: Never  Vaping Use   Vaping Use: Never used  Substance Use Topics   Alcohol use: Yes    Comment: seldom   Drug use: Yes    Types: Marijuana    Comment: smokes for pain    Home Medications Prior to Admission medications   Medication Sig Start Date End Date Taking? Authorizing Provider  albuterol (VENTOLIN HFA) 108 (90 Base) MCG/ACT inhaler Inhale 1-2 puffs into the lungs every 6 (six) hours as needed for wheezing or shortness of breath. 01/17/21  Yes Dorie Rank, MD  EPINEPHrine 0.3 mg/0.3 mL IJ SOAJ injection Inject 0.3 mg into the muscle as needed for anaphylaxis (For severe allergic reaction only with difficulty breathing, tongue swelling or throat swelling.). 04/08/21  Yes Rancour, Annie Main, MD  oxyCODONE-acetaminophen (PERCOCET/ROXICET) 5-325 MG tablet Take 1 tablet by mouth every 8 (eight) hours as needed for up to 2 days for severe pain. 07/08/21 07/10/21 Yes Marcello Fennel, PA-C  rivaroxaban (XARELTO) 20 MG TABS tablet Take 1 tablet (20 mg total) by mouth daily with breakfast. 04/09/21  Yes Schnier, Dolores Lory, MD  tamsulosin (FLOMAX) 0.4 MG CAPS capsule Take 1 capsule (0.4 mg total) by mouth daily for 14 days. 07/08/21 07/22/21 Yes Marcello Fennel, PA-C  pantoprazole (PROTONIX) 20 MG tablet Take 1 tablet (20 mg total) by mouth daily. 07/05/21 07/05/22  Barton Dubois, MD    Allergies    Bee venom, Penicillins, Zofran [ondansetron], Other, and Sulfa antibiotics  Review of Systems   Review of Systems  Constitutional:  Negative for chills and fever.  HENT:  Negative for congestion.   Respiratory:  Negative for shortness of breath.   Cardiovascular:  Negative for chest pain.  Gastrointestinal:  Negative for abdominal pain.  Genitourinary:  Positive for  decreased urine volume and difficulty urinating. Negative for dysuria, enuresis, flank pain, penile discharge, penile pain, scrotal swelling and testicular pain.  Musculoskeletal:  Negative for back pain.  Skin:  Negative for rash.  Neurological:  Negative for dizziness.  Hematological:  Does not bruise/bleed easily.   Physical Exam Updated Vital Signs BP 136/76 (BP Location: Right Arm)   Pulse 78   Temp 97.6 F (36.4 C) (Oral)   Resp (!) 22   Ht '5\' 11"'$  (1.803 m)   Wt 104.3 kg   SpO2 100%   BMI 32.08 kg/m   Physical Exam Vitals and nursing note reviewed. Exam conducted with a chaperone present.  Constitutional:      General: He is not in acute distress.    Appearance: He is not ill-appearing.  HENT:     Head: Normocephalic and atraumatic.     Nose: No congestion.  Eyes:     Conjunctiva/sclera:  Conjunctivae normal.  Cardiovascular:     Rate and Rhythm: Normal rate and regular rhythm.     Pulses: Normal pulses.     Heart sounds: No murmur heard.   No friction rub. No gallop.  Pulmonary:     Effort: No respiratory distress.     Breath sounds: No wheezing, rhonchi or rales.  Abdominal:     Palpations: Abdomen is soft.     Tenderness: There is abdominal tenderness. There is right CVA tenderness and left CVA tenderness.     Comments: Abdomen nondistended, normal bowel sounds, dull to percussion, he had slight tenderness to palpation in his suprapubic region, there is no distention, no mass present, he had slight CVA tenderness bilaterally.  No guarding, rebound tenderness, peritoneal sign noted.  Genitourinary:    Penis: Normal.      Testes: Normal.     Comments: With chaperone present,  genital exam was performed testicles and penile shaft showed no gross abnormalities, no [place] deformities, testicles are nontender to palpation, no evidence of penile discharge.  Skin:    General: Skin is warm and dry.  Neurological:     Mental Status: He is alert.  Psychiatric:         Mood and Affect: Mood normal.    ED Results / Procedures / Treatments   Labs (all labs ordered are listed, but only abnormal results are displayed) Labs Reviewed  URINALYSIS, ROUTINE W REFLEX MICROSCOPIC - Abnormal; Notable for the following components:      Result Value   Color, Urine AMBER (*)    APPearance CLOUDY (*)    Hgb urine dipstick SMALL (*)    Bilirubin Urine SMALL (*)    Ketones, ur 5 (*)    Protein, ur >=300 (*)    Leukocytes,Ua SMALL (*)    RBC / HPF >50 (*)    WBC, UA >50 (*)    Bacteria, UA RARE (*)    All other components within normal limits  COMPREHENSIVE METABOLIC PANEL - Abnormal; Notable for the following components:   Glucose, Bld 110 (*)    Creatinine, Ser 1.25 (*)    All other components within normal limits  URINE CULTURE  CBC WITH DIFFERENTIAL/PLATELET    EKG None  Radiology CT Renal Stone Study  Result Date: 07/08/2021 CLINICAL DATA:  Groin pain.  Flank pain. EXAM: CT ABDOMEN AND PELVIS WITHOUT CONTRAST TECHNIQUE: Multidetector CT imaging of the abdomen and pelvis was performed following the standard protocol without IV contrast. COMPARISON:  None. FINDINGS: Lower chest: Lung bases are clear. Hepatobiliary: No focal hepatic lesion. No biliary duct dilatation. Common bile duct is normal. Pancreas: Pancreas is normal. No ductal dilatation. No pancreatic inflammation. Spleen: Normal spleen Adrenals/urinary tract: Adrenal glands normal. Mild renal edema and hydroureter on RIGHT. Mild obstructive uropathy related to a calculus in the distal RIGHT ureter at the RIGHT vesicoureteral junction. This calculus measures approximately 1 mm (image 76/series 2). No additional renal calculi Stomach/Bowel: Stomach, small bowel, appendix, and cecum are normal. The colon and rectosigmoid colon are normal. Vascular/Lymphatic: Abdominal aorta is normal caliber. No periportal or retroperitoneal adenopathy. No pelvic adenopathy. Reproductive: Prostate unremarkable Other:  No free fluid. Musculoskeletal: No aggressive osseous lesion. IMPRESSION: 1. Obstructing calculus in the distal RIGHT ureter at vesicoureteral junction. 2. No additional renal calculi Electronically Signed   By: Suzy Bouchard M.D.   On: 07/08/2021 15:34    Procedures Procedures   Medications Ordered in ED Medications  oxyCODONE-acetaminophen (PERCOCET/ROXICET) 5-325 MG per  tablet 1 tablet (1 tablet Oral Given 07/08/21 1448)  ketorolac (TORADOL) 30 MG/ML injection 30 mg (30 mg Intramuscular Given 07/08/21 1625)    ED Course  I have reviewed the triage vital signs and the nursing notes.  Pertinent labs & imaging results that were available during my care of the patient were reviewed by me and considered in my medical decision making (see chart for details).    MDM Rules/Calculators/A&P                          Initial impression-patient presents with difficulty with urination.  He is alert, does not appear in acute distress, vital signs reassuring.  Will obtain bladder scan, add on basic lab work-up, provide patient pain medications and reassess.  Work-up-CBC unremarkable, CMP shows slight hyperglycemia 110, creatinine 1.25, UA shows small leukocytes, many red blood cells, many white blood cells, rare bacteria, squamous cells present.  CT renal reveals 1 mm obstructing calculus in the distal right ureter at the UVJ point.  Reassessment-bladder scan was performed showed no fluid within the bladder, ultrasound was also performed bladder appears to be decompressed.  Will defer on catheterization at this time.  Patient was assessed after oxycodone, states he still has pain at this time, will provide him with Toradol and reassess.  Patient was reassessed states his pain is completely resolved, has no other complaints this time.  Patient is agreeable for discharge.  Rule out-low suspicion for UTI, septic stone, [place] patient is nontoxic-appearing, vital signs reassuring, no leukocytosis noted  on CBC.  UA is not consistent with infection as there is no nitrates present, there are noted leukocytes but there is squame cells present suspect this contaminated the urine sample.  Will culture urine for further evaluation.  Suspicion for postrenal obstruction as there is no hydronephrosis noted on CT renal.  Low suspicion for AAA, dissection presentation is atypical of etiology.    Plan-  Flank pain-likely secondary due to the small stone noted in the UVJ, suspect this will pass on its own, will start him on Flomax, provide pain medications, follow-up with urology for further evaluation patient patient was given strict return precautions.  Vital signs have remained stable, no indication for hospital admission.  Patient given at home care as well strict return precautions.  Patient verbalized that they understood agreed to said plan.  Final Clinical Impression(s) / ED Diagnoses Final diagnoses:  Calculus of ureter    Rx / DC Orders ED Discharge Orders          Ordered    tamsulosin (FLOMAX) 0.4 MG CAPS capsule  Daily        07/08/21 1728    oxyCODONE-acetaminophen (PERCOCET/ROXICET) 5-325 MG tablet  Every 8 hours PRN        07/08/21 1728             Marcello Fennel, PA-C 07/08/21 1731    Dorie Rank, MD 07/09/21 [phone]

## 2021-07-09 LAB — URINE CULTURE

## 2021-07-09 MED FILL — Oxycodone w/ Acetaminophen Tab 5-325 MG: ORAL | Qty: 6 | Status: AC

## 2021-07-30 ENCOUNTER — Encounter: Payer: Self-pay | Admitting: Physician Assistant

## 2021-07-30 NOTE — Progress Notes (Signed)
Cardiology Office Note    Date:  07/31/2021   ID:  Lance Cohen, DOB 1971-06-11, MRN [id number]  PCP:  [name], NP  Cardiologist:  Carlyle Dolly, MD  Electrophysiologist:  None   Chief Complaint: f/u loss of consciousness, also chest pain  History of Present Illness:   Lance Cohen is a 50 y.o. 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   He had a prior admission 2018 with atypical chest pain, coronary CTA without significant disease (minimal plaque mid LAD 0-25% stenosis). He was admitted in 05/2021 with constant chest pain with negative troponins, found to have episodic sinus bradycardia with HR high 30s-low 40s at times, often 60s-70s, no heart block noted. CTA was negative for PE. 2D echo showed normal EF 55%, borderline dilation of aortic root otherwise normal. No further ischemic eval planned. OP monitor was arranged. He was readmitted 9/14-9/15 with loss of consciousness and altered mentation. Per notes, "wife found patient altered mention compared to normal about 5 minutes ago. "Color turned blue and breathing very slow". EMS came and found patient in bradycardia and agonal breathing and bagged him for 4-5 min, but no narcan given, and patient resumed normal breathing, but remained sleepy. Patient does no recall anything other than he was feeling "about to throw up" some time before the episode." Cardiology was called to interrogate his monitor but unfortunately due to the type of Zio he was wearing, unable to do so until it was mailed back. He also had AKI, UDS +cocaine/THC, hypokalemia on admit of 3.1, normal Mg.  He wore 2 back to back monitors - the first was a 7 day monitor 9/8-9/15/22 with predominantly NSR average 70bpm, min HR 37, max HR 148, 1 run SVT lasting 11 beats, rare  ectopy, triggers associated with NSR only. Second monitor 9/15-9/29 showed predominantly NSR average 76bpm, min HR 41, max HR 160, 1 run SVT lasting 5 beats, rare ectopy. There do not appear to be any arrhythmia events correlating with his LOC episode on 9/14. His bradycardia seemed to be primarily in the early AM hours otherwise largely NSR.  He is seen back for follow-up accompanied by his wife. He has not had any recurrent episodes of syncope. He overall is feeling at baseline except does report that ever since lifting some groceries on Sunday he's had a very focal chest pain in his left pectoral region "that's just killing me." This is made better with exertion and pressing on his chest wall. No pain with inspiration. He states he always has diffuse chronic pain regardless of what is going on. He has chronic easy sweating without change recently. No dyspnea or leg swelling. He reports use of tobacco, ETOH, and marijuana.  Labwork independently reviewed: 06/2021 CBC wnl, UDS + cocaine/THC, TSH 4.875, Mg 2.1, initial BMET with K 3.1 and Cr 1.58, improving to K 4.1 and Cr 1.14 on recheck 2018 LDL 68  Past Medical History:  Diagnosis Date   Anxiety    Arthritis    Asthma    Bipolar 1 disorder (HCC)    Chronic knee pain    Depression    Dilated aortic root (HCC)    DVT (deep venous thrombosis) (HCC)    Hypertension    Insomnia    Sinus bradycardia    Sleep apnea  Spinal stenosis    Suicide attempt [facility])    2010 Intentional overdose attempt with Depakote.    Past Surgical History:  Procedure Laterality Date   COLONOSCOPY WITH PROPOFOL N/A 03/18/2019   Procedure: COLONOSCOPY WITH PROPOFOL;  Surgeon: [name], MD;  Location: [facility];  Service: Endoscopy;  Laterality: N/A;  2:30pm   FOOT SURGERY     had peice of wire removed   IVC FILTER REMOVAL N/A 02/24/2017   Procedure: IVC Filter Removal;  Surgeon: [name], MD;  Location: [place] [facility];  Service:  Cardiovascular;  Laterality: N/A;   ORIF RADIAL FRACTURE Right 12/05/2020   Procedure: OPEN REDUCTION INTERNAL FIXATION (ORIF) RADIAL FRACTURE and ULNA FRACTURE;  Surgeon: Carole Civil, MD;  Location: [facility];  Service: Orthopedics;  Laterality: Right;   PERIPHERAL VASCULAR CATHETERIZATION Right 10/22/2016   Procedure: Thrombectomy, thrombolyisis;  Surgeon: [name], MD;  Location: [place] [facility];  Service: Cardiovascular;  Laterality: Right;   PERIPHERAL VASCULAR CATHETERIZATION N/A 10/22/2016   Procedure: IVC Filter Insertion;  Surgeon: [name], MD;  Location: [place] [facility];  Service: Cardiovascular;  Laterality: N/A;   POLYPECTOMY  03/18/2019   Procedure: POLYPECTOMY;  Surgeon: [name], MD;  Location: [facility];  Service: Endoscopy;;    Current Medications: Current Meds  Medication Sig   albuterol (VENTOLIN HFA) 108 (90 Base) MCG/ACT inhaler Inhale 1-2 puffs into the lungs every 6 (six) hours as needed for wheezing or shortness of breath.   EPINEPHrine 0.3 mg/0.3 mL IJ SOAJ injection Inject 0.3 mg into the muscle as needed for anaphylaxis (For severe allergic reaction only with difficulty breathing, tongue swelling or throat swelling.).   pantoprazole (PROTONIX) 20 MG tablet Take 1 tablet (20 mg total) by mouth daily.   rivaroxaban (XARELTO) 20 MG TABS tablet Take 1 tablet (20 mg total) by mouth daily with breakfast.     Allergies:   Bee venom, Penicillins, Zofran [ondansetron], Other, and Sulfa antibiotics   Social History   Socioeconomic History   Marital status: Married    Spouse name: Not on file   Number of children: Not on file   Years of education: Not on file   Highest education level: Not on file  Occupational History   Not on file  Tobacco Use   Smoking status: Every Day    Packs/day: 0.50    Types: Cigarettes   Smokeless tobacco: Never  Vaping Use   Vaping Use: Never used  Substance and Sexual Activity   Alcohol use:  Yes    Comment: seldom   Drug use: Yes    Types: Marijuana    Comment: smokes for pain   Sexual activity: Yes  Other Topics Concern   Not on file  Social History Narrative   Not on file   Social Determinants of Health   Financial Resource Strain: Not on file  Food Insecurity: Not on file  Transportation Needs: Not on file  Physical Activity: Not on file  Stress: Not on file  Social Connections: Not on file     Family History:  The patient's family history includes Cancer in his mother; Heart attack in his father. There is no history of Colon cancer.  ROS:   Please see the history of present illness. General aches and pains all over All other systems are reviewed and otherwise negative.    EKGs/Labs/Other Studies Reviewed:    Studies reviewed are outlined and summarized above. Reports  included below if pertinent.  2D echo 06/17/21   1. Left ventricular ejection fraction, by estimation, is 55%. The left  ventricle has normal function. The left ventricle has no regional wall  motion abnormalities. Left ventricular diastolic parameters were normal.   2. Right ventricular systolic function is normal. The right ventricular  size is normal. Tricuspid regurgitation signal is inadequate for assessing  PA pressure.   3. The mitral valve is normal in structure. Trivial mitral valve  regurgitation. No evidence of mitral stenosis.   4. The aortic valve is normal in structure. Aortic valve regurgitation is  not visualized. No aortic stenosis is present.   5. Aortic dilatation noted. There is borderline dilatation of the aortic  root, measuring 37 mm.   6. The inferior vena cava is normal in size with greater than 50%  respiratory variability, suggesting right atrial pressure of 3 mmHg.   Monitor with study date (2022-09-08T11:06:33-0400 to 2022-09-15T03:46:47-0400) Patient had a min HR of 37 bpm, max HR of 148 bpm, and avg HR of 70 bpm. Predominant underlying rhythm was Sinus  Rhythm. 1 run of Supraventricular Tachycardia occurred lasting 11 beats with a max rate of 126 bpm (avg 113 bpm). Isolated SVEs were rare  (<1.0%), and no SVE Couplets or SVE Triplets were present. Isolated VEs were rare (<1.0%), and no VE Couplets or VE Triplets were present. Inverted QRS complexes possibly due to inverted placement of device.   Monitor with study date (2022-09-15T09:42:55-0400 to 2022-09-29T09:42:59-0400) Patient had a min HR of 41 bpm, max HR of 160 bpm, and avg HR of 76 bpm. Predominant underlying rhythm was Sinus Rhythm. 1 run of Supraventricular Tachycardia occurred lasting 5 beats with a max rate of 160 bpm (avg 155 bpm). Isolated SVEs were rare  (<1.0%), SVE Couplets were rare (<1.0%), and SVE Triplets were rare (<1.0%). Isolated VEs were rare (<1.0%), and no VE Couplets or VE Triplets were present.   Cor CT 2018 FINDINGS: A 120 kV prospective scan was triggered in the descending thoracic aorta at 111 HU's. Axial non-contrast 3 mm slices were carried out through the heart. The data set was analyzed on a dedicated work station and scored using the [place]. Gantry rotation speed was 250 msecs and collimation was .6 mm. 5 mg of iv Metoprolol and 0.8 mg of sl NTG was given. The 3D data set was reconstructed in 5% intervals of the 67-82 % of the R-R cycle. Diastolic phases were analyzed on a dedicated work station using MPR, MIP and VRT modes. The patient received 80 cc of contrast.   Aorta:  Normal size.  No calcifications.  No dissection.   Aortic Valve:  Trileaflet.  No calcifications.   Coronary Arteries:  Normal coronary origin.  Right dominance.   RCA is a large dominant artery that gives rise to PDA and PLVB. There is no plaque.   Left main is a large artery that gives rise to LAD, ramus intermedius and LCX arteries. There is no plaque.   LAD is a large vessel that gives rise to one diagonal artery. There is a minimal calcified plaque in the mid LAD  with associated stenosis 0-25%.   RI is a large artery that has no plaque.   LCX is a small non-dominant artery that gives rise to one small OM1 branch. There is no plaque.   Other findings:   Normal pulmonary vein drainage into the left atrium.   Normal let atrial appendage without a thrombus.   Mildly  dilated pulmonary artery measuring 34 mm.   IMPRESSION: 1. Coronary calcium score of 15 this was 15 percentile for age and sex matched control.   2. Normal coronary origin with right dominance.   3. Minimal non-obstructive plaque in the mid LAD with associated stenosis 0-25%. Aggressive medical management is recommended.   4. Mildly dilated pulmonary artery measuring 34 mm suggestive of pulmonary hypertension.     Electronically Signed   By: Ena Dawley   On: 10/20/2017 15:34    EKG:  EKG is ordered today, personally reviewed, demonstrating NSR 60bpm, no acute STT changes  Recent Labs: 07/04/2021: Magnesium 2.1; TSH 4.875 07/08/2021: ALT 26; BUN 13; Creatinine, Ser 1.25; Hemoglobin 16.6; Platelets 296; Potassium 4.0; Sodium 136  Recent Lipid Panel    Component Value Date/Time   CHOL 120 10/19/2017 1029   TRIG 96 10/19/2017 1029   HDL 33 (L) 10/19/2017 1029   CHOLHDL 3.6 10/19/2017 1029   VLDL 19 10/19/2017 1029   LDLCALC 68 10/19/2017 1029    PHYSICAL EXAM:    VS:  BP 118/76   Pulse 67   Ht 5\' 11"  (1.803 m)   Wt 220 lb (99.8 kg)   SpO2 98%   BMI 30.68 kg/m   BMI: Body mass index is 30.68 kg/m.  GEN: Well nourished, well developed male in no acute distress HEENT: normocephalic, atraumatic Neck: no JVD, carotid bruits, or masses Cardiac: RRR; no murmurs, rubs, or gallops, no edema  Respiratory:  clear to auscultation bilaterally, normal work of breathing GI: soft, nontender, nondistended, + BS MS: no deformity or atrophy Skin: warm and dry, no rash Neuro:  Alert and Oriented x 3, Strength and sensation are intact, follows commands Psych: euthymic  mood, full affect  Wt Readings from Last 3 Encounters:  07/31/21 220 lb (99.8 kg)  07/08/21 230 lb (104.3 kg)  06/17/21 214 lb 4.6 oz (97.2 kg)     ASSESSMENT & PLAN:   1. Recurrent LOC - second episode occurred while actively wearing the cardiac monitor and was not associated with any arrhythmias, therefore question noncardiac etiology. He was noted to have positive cocaine on UDS so question whether there was any component of drug-induced respiratory depression. He has not had any recurrent episodes. Will refer to neurology for completeness for consideration of seizure. If syncope recurs would refer to EP for their input.  2. Bradycardia also very brief PSVT, rare ectopy - no obvious correlation to LOC event above. Avoid AVN blocking agents. Recheck TSH today with fT4 as well as BMET given recent hypokalemia. If syncope recurs would refer to EP for their input.  3. Chest pain - mostly atypical features. EKG reassuring. Patient adamant that he does not want to seek further evaluation of this. Does not report any illicit drug use aside from marijuana today. Will check troponin as duration of pain >48 hours at this point. I told him if it is abnormal, we will reinforce need to go to ED and have further testing. If it is normal, this would argue against ACS at this point and he can follow up with primary care. Will also get CBC/BMET today. Reports compliance with Xarelto - PE felt less likely as he is not tachycardic, tachypneic or hypoxic.  4. Abnormal TSH - recheck today.  5. Borderline dilation of aorta - consider f/u echo 05/2022, can be arranged closer to that time in follow-up.  6. Minimal CAD in 2018 - consideration should be given to statin therapy if LDL is >  70. Check lipids/direct LDL today per discussion with patient.  7. History of DVT - on Xarelto, managed by primary care.   Disposition: F/u with Dr. Harl Bowie or APP in 3-4 months to f/u chest pain and bradycardia.   Medication  Adjustments/Labs and Tests Ordered: Current medicines are reviewed at length with the patient today.  Concerns regarding medicines are outlined above. Medication changes, Labs and Tests ordered today are summarized above and listed in the Patient Instructions accessible in Encounters.    Signed, Charlie Pitter, PA-C  07/31/2021 3:05 PM    [facility] Location in [place], [place] [address] Ph: [phone]; Fax [phone]

## 2021-07-31 ENCOUNTER — Ambulatory Visit: Payer: Medicaid Other | Admitting: Physician Assistant

## 2021-07-31 ENCOUNTER — Other Ambulatory Visit: Payer: Self-pay

## 2021-07-31 ENCOUNTER — Encounter: Payer: Self-pay | Admitting: Physician Assistant

## 2021-07-31 ENCOUNTER — Other Ambulatory Visit (HOSPITAL_COMMUNITY)
Admission: RE | Admit: 2021-07-31 | Discharge: 2021-07-31 | Disposition: A | Discharge status: Home / Self Care | Payer: Medicaid Other | Source: Ambulatory Visit | Attending: Physician Assistant | Admitting: Physician Assistant

## 2021-07-31 VITALS — BP 118/76 | HR 67 | Ht 71.0 in | Wt 220.0 lb

## 2021-07-31 DIAGNOSIS — R001 Bradycardia, unspecified: Secondary | ICD-10-CM | POA: Diagnosis not present

## 2021-07-31 DIAGNOSIS — Z86718 Personal history of other venous thrombosis and embolism: Secondary | ICD-10-CM

## 2021-07-31 DIAGNOSIS — R4020 Unspecified coma: Secondary | ICD-10-CM

## 2021-07-31 DIAGNOSIS — R7989 Other specified abnormal findings of blood chemistry: Secondary | ICD-10-CM | POA: Diagnosis not present

## 2021-07-31 DIAGNOSIS — R0789 Other chest pain: Secondary | ICD-10-CM | POA: Diagnosis not present

## 2021-07-31 DIAGNOSIS — I2510 Atherosclerotic heart disease of native coronary artery without angina pectoris: Secondary | ICD-10-CM

## 2021-07-31 DIAGNOSIS — I77810 Thoracic aortic ectasia: Secondary | ICD-10-CM

## 2021-07-31 DIAGNOSIS — R55 Syncope and collapse: Secondary | ICD-10-CM

## 2021-07-31 LAB — TROPONIN I (HIGH SENSITIVITY): Troponin I (High Sensitivity): 2 ng/L (ref <18)

## 2021-07-31 NOTE — Patient Instructions (Signed)
Medication Instructions:  Your physician recommends that you continue on your current medications as directed. Please refer to the Current Medication list given to you today.   *If you need a refill on your cardiac medications before your next appointment, please call your pharmacy*   Lab Work: Your physician recommends that you return for lab work in: Today ( BMET, CBC, TSH, Free T4, Troponin)   If you have labs (blood work) drawn today and your tests are completely normal, you will receive your results only by: MyChart Message (if you have MyChart) OR A paper copy in the mail If you have any lab test that is abnormal or we need to change your treatment, we will call you to review the results.   Testing/Procedures: NONE    Follow-Up: At [facility], you and your health needs are our priority.  As part of our continuing mission to provide you with exceptional heart care, we have created designated Provider Care Teams.  These Care Teams include your primary Cardiologist (physician) and Advanced Practice Providers (APPs -  Physician Assistants and Nurse Practitioners) who all work together to provide you with the care you need, when you need it.  We recommend signing up for the patient portal called "MyChart".  Sign up information is provided on this After Visit Summary.  MyChart is used to connect with patients for Virtual Visits (Telemedicine).  Patients are able to view lab/test results, encounter notes, upcoming appointments, etc.  Non-urgent messages can be sent to your provider as well.   To learn more about what you can do with MyChart, go to [email or web address].    Your next appointment:   3 -4 month(s)  The format for your next appointment:   In Person  Provider:   Carlyle Dolly, MD   Other Instructions Thank you for choosing [facility]!

## 2021-08-02 ENCOUNTER — Telehealth: Payer: Self-pay

## 2021-08-02 NOTE — Telephone Encounter (Signed)
Pt notified and verbalized understanding.

## 2021-08-02 NOTE — Telephone Encounter (Signed)
-----   Message from Arnoldo Lenis, MD sent at 07/31/2021  7:25 AM EDT ----- Heart monitor overlal looks good, some occasional extra heart beats that are considered benign, no worrisome findings.    Zandra Abts MD

## 2021-08-11 ENCOUNTER — Emergency Department (HOSPITAL_COMMUNITY)
Admission: EM | Admit: 2021-08-11 | Discharge: 2021-08-11 | Disposition: A | Discharge status: Home / Self Care | Payer: Medicaid Other | Attending: Emergency Medicine | Admitting: Emergency Medicine

## 2021-08-11 ENCOUNTER — Emergency Department (HOSPITAL_COMMUNITY): Payer: Medicaid Other

## 2021-08-11 ENCOUNTER — Other Ambulatory Visit: Payer: Self-pay

## 2021-08-11 ENCOUNTER — Encounter (HOSPITAL_COMMUNITY): Payer: Self-pay | Admitting: Emergency Medicine

## 2021-08-11 DIAGNOSIS — Z79899 Other long term (current) drug therapy: Secondary | ICD-10-CM | POA: Diagnosis not present

## 2021-08-11 DIAGNOSIS — I10 Essential (primary) hypertension: Secondary | ICD-10-CM | POA: Diagnosis not present

## 2021-08-11 DIAGNOSIS — F17210 Nicotine dependence, cigarettes, uncomplicated: Secondary | ICD-10-CM | POA: Diagnosis not present

## 2021-08-11 DIAGNOSIS — F1490 Cocaine use, unspecified, uncomplicated: Secondary | ICD-10-CM | POA: Insufficient documentation

## 2021-08-11 DIAGNOSIS — J45909 Unspecified asthma, uncomplicated: Secondary | ICD-10-CM | POA: Insufficient documentation

## 2021-08-11 DIAGNOSIS — R072 Precordial pain: Secondary | ICD-10-CM

## 2021-08-11 DIAGNOSIS — R0789 Other chest pain: Secondary | ICD-10-CM | POA: Diagnosis not present

## 2021-08-11 LAB — COMPREHENSIVE METABOLIC PANEL
ALT: 13 U/L (ref 0–44)
AST: 19 U/L (ref 15–41)
Albumin: 3.9 g/dL (ref 3.5–5.0)
Alkaline Phosphatase: 87 U/L (ref 38–126)
Anion gap: 6 (ref 5–15)
BUN: 15 mg/dL (ref 6–20)
CO2: 28 mmol/L (ref 22–32)
Calcium: 8.8 mg/dL — ABNORMAL LOW (ref 8.9–10.3)
Chloride: 106 mmol/L (ref 98–111)
Creatinine, Ser: 1.3 mg/dL — ABNORMAL HIGH (ref 0.61–1.24)
GFR, Estimated: >60 mL/min (ref >60)
Glucose, Bld: 98 mg/dL (ref 70–99)
Potassium: 4.1 mmol/L (ref 3.5–5.1)
Sodium: 140 mmol/L (ref 135–145)
Total Bilirubin: 1 mg/dL (ref 0.3–1.2)
Total Protein: 7.1 g/dL (ref 6.5–8.1)

## 2021-08-11 LAB — RAPID URINE DRUG SCREEN, HOSP PERFORMED
Amphetamines: NOT DETECTED
Barbiturates: NOT DETECTED
Benzodiazepines: NOT DETECTED
Cocaine: POSITIVE — AB
Opiates: NOT DETECTED
Tetrahydrocannabinol: POSITIVE — AB

## 2021-08-11 LAB — TROPONIN I (HIGH SENSITIVITY)
Troponin I (High Sensitivity): 3 ng/L (ref <18)
Troponin I (High Sensitivity): 3 ng/L (ref <18)

## 2021-08-11 LAB — CBC
HCT: 45.4 % (ref 39.0–52.0)
Hemoglobin: 15.5 g/dL (ref 13.0–17.0)
MCH: 32.8 pg (ref 26.0–34.0)
MCHC: 34.1 g/dL (ref 30.0–36.0)
MCV: 96.2 fL (ref 80.0–100.0)
Platelets: 262 10*3/uL (ref 150–400)
RBC: 4.72 MIL/uL (ref 4.22–5.81)
RDW: 12.1 % (ref 11.5–15.5)
WBC: 8.7 10*3/uL (ref 4.0–10.5)
nRBC: 0 % (ref 0.0–0.2)

## 2021-08-11 MED ORDER — ACETAMINOPHEN 500 MG PO TABS
1000.0000 mg | ORAL_TABLET | Freq: Once | ORAL | Status: AC
  Administered 2021-08-11: 1000 mg via ORAL
  Filled 2021-08-11: qty 2

## 2021-08-11 MED ORDER — FAMOTIDINE 20 MG PO TABS
20.0000 mg | ORAL_TABLET | Freq: Once | ORAL | Status: AC
  Administered 2021-08-11: 20 mg via ORAL
  Filled 2021-08-11: qty 1

## 2021-08-11 MED ORDER — ALUM & MAG HYDROXIDE-SIMETH 200-200-20 MG/5ML PO SUSP
30.0000 mL | Freq: Once | ORAL | Status: AC
  Administered 2021-08-11: 30 mL via ORAL
  Filled 2021-08-11: qty 30

## 2021-08-11 NOTE — ED Provider Notes (Addendum)
[facility] EMERGENCY DEPARTMENT Provider Note   CSN: [id number] Arrival date & time: 08/11/21  1002     History Chief Complaint  Patient presents with   Chest Pain    Lance Cohen is a 50 y.o. male.  Patient c/o mid/bilateral chest pain in the past 1-2 days. Symptoms at rest, constant, dull to sharp, moderate, non radiating, not pleuritic. No associated sob or diaphoresis. Had episode of nausea/vomiting yesterday - not bloody or bilious. No abd pain or distension. Having normal bms. Occasional non prod cough. No sore throat. No fever or chills. +smoker. Denies specific known covid or ill contact exposure. No leg pain or swelling. No heartburn. States complaint w meds including anticoag therapy. No unusual doe or fatigue.   The history is provided by the patient and medical records.  Chest Pain Associated symptoms: cough   Associated symptoms: no abdominal pain, no back pain, no fever, no headache, no numbness, no palpitations, no shortness of breath and no weakness       Past Medical History:  Diagnosis Date   Anxiety    Arthritis    Asthma    Bipolar 1 disorder (HCC)    Chronic knee pain    Depression    Dilated aortic root (HCC)    DVT (deep venous thrombosis) (HCC)    Hypertension    Insomnia    Sinus bradycardia    Sleep apnea    Spinal stenosis    Suicide attempt ([place])    2010 Intentional overdose attempt with Depakote.    Patient Active Problem List   Diagnosis Date Noted   Syncope 07/04/2021   Bradycardia 06/16/2021   Closed fracture of right distal radius and ulna 12/21/2020   Closed fracture of right forearm    Daily headache 11/27/2020   Dizzy spells 11/27/2020   Seizure ([place]) 11/27/2020   Myofascial pain dysfunction syndrome 11/22/2020   Cocaine abuse ([place]) 11/18/2020   Foreign body (FB) in soft tissue 09/08/2020   Chronic low back pain 05/11/2020   Shoulder pain 05/11/2020   Headache 05/11/2020   Cervical radiculopathy 02/18/2020    Impingement syndrome of right shoulder 01/18/2020   Rectal pain 06/29/2019   GERD (gastroesophageal reflux disease) 06/29/2019   Rectal bleeding 02/22/2019   Priapism 02/08/2019   Chest pain 10/19/2017   Asthma 10/19/2017   Hypertension 10/19/2017   Arthralgia of right knee 10/19/2017   Pain in limb 12/27/2016   History of pulmonary embolus (PE) 10/23/2016   Nausea and vomiting 10/23/2016   S/P IVC filter 10/23/2016   Tobacco abuse counseling 10/23/2016   Chronic deep vein thrombosis (DVT) ([place]) 10/18/2016   Cellulitis of foot 06/24/2011   Bipolar 1 disorder ([place]) 06/24/2011   Tobacco abuse 06/24/2011   Polycythemia 06/24/2011   Tinea pedis 06/24/2011   CARPAL TUNNEL SYNDROME, RIGHT 06/21/2010   CONTUSION, LEFT FOOT 06/05/2010   FOREIGN BODY, FOOT 04/17/2010    Past Surgical History:  Procedure Laterality Date   COLONOSCOPY WITH PROPOFOL N/A 03/18/2019   Procedure: COLONOSCOPY WITH PROPOFOL;  Surgeon: [name], MD;  Location: [facility];  Service: Endoscopy;  Laterality: N/A;  2:30pm   FOOT SURGERY     had peice of wire removed   IVC FILTER REMOVAL N/A 02/24/2017   Procedure: IVC Filter Removal;  Surgeon: [name], MD;  Location: [place] [facility];  Service: Cardiovascular;  Laterality: N/A;   ORIF RADIAL FRACTURE Right 12/05/2020   Procedure: OPEN REDUCTION INTERNAL FIXATION (ORIF) RADIAL  FRACTURE and ULNA FRACTURE;  Surgeon: Carole Civil, MD;  Location: [facility];  Service: Orthopedics;  Laterality: Right;   PERIPHERAL VASCULAR CATHETERIZATION Right 10/22/2016   Procedure: Thrombectomy, thrombolyisis;  Surgeon: [name], MD;  Location: [place] [facility];  Service: Cardiovascular;  Laterality: Right;   PERIPHERAL VASCULAR CATHETERIZATION N/A 10/22/2016   Procedure: IVC Filter Insertion;  Surgeon: [name], MD;  Location: [place] [facility];  Service: Cardiovascular;  Laterality: N/A;   POLYPECTOMY  03/18/2019   Procedure: POLYPECTOMY;   Surgeon: [name], MD;  Location: [facility];  Service: Endoscopy;;       Family History  Problem Relation Age of Onset   Cancer Mother    Heart attack Father    Colon cancer Neg Hx     Social History   Tobacco Use   Smoking status: Every Day    Packs/day: 0.50    Types: Cigarettes   Smokeless tobacco: Never  Vaping Use   Vaping Use: Never used  Substance Use Topics   Alcohol use: Yes    Comment: seldom   Drug use: Yes    Types: Marijuana    Comment: smokes for pain    Home Medications Prior to Admission medications   Medication Sig Start Date End Date Taking? Authorizing Provider  albuterol (VENTOLIN HFA) 108 (90 Base) MCG/ACT inhaler Inhale 1-2 puffs into the lungs every 6 (six) hours as needed for wheezing or shortness of breath. 01/17/21   Dorie Rank, MD  EPINEPHrine 0.3 mg/0.3 mL IJ SOAJ injection Inject 0.3 mg into the muscle as needed for anaphylaxis (For severe allergic reaction only with difficulty breathing, tongue swelling or throat swelling.). 04/08/21   Rancour, Annie Main, MD  pantoprazole (PROTONIX) 20 MG tablet Take 1 tablet (20 mg total) by mouth daily. 07/05/21 07/05/22  Barton Dubois, MD  rivaroxaban (XARELTO) 20 MG TABS tablet Take 1 tablet (20 mg total) by mouth daily with breakfast. 04/09/21   Schnier, Dolores Lory, MD    Allergies    Bee venom, Penicillins, Zofran [ondansetron], Other, and Sulfa antibiotics  Review of Systems   Review of Systems  Constitutional:  Negative for fever.  HENT:  Negative for sore throat.   Eyes:  Negative for redness and visual disturbance.  Respiratory:  Positive for cough. Negative for shortness of breath.   Cardiovascular:  Positive for chest pain. Negative for palpitations and leg swelling.  Gastrointestinal:  Negative for abdominal pain.  Genitourinary:  Negative for dysuria, flank pain and hematuria.  Musculoskeletal:  Negative for back pain and neck pain.  Skin:  Negative for rash.  Neurological:  Negative  for speech difficulty, weakness, numbness and headaches.  Hematological:  Does not bruise/bleed easily.  Psychiatric/Behavioral:  Negative for confusion.    Physical Exam Updated Vital Signs BP 122/81   Pulse 63   Temp 97.8 F (36.6 C) (Oral)   Resp 17   Ht 1.803 m (5\' 11" )   Wt 99.8 kg   SpO2 98%   BMI 30.68 kg/m   Physical Exam Vitals and nursing note reviewed.  Constitutional:      Appearance: Normal appearance. He is well-developed.  HENT:     Head: Atraumatic.     Nose: Nose normal.     Mouth/Throat:     Mouth: Mucous membranes are moist.     Pharynx: Oropharynx is clear.  Eyes:     General: No scleral icterus.    Conjunctiva/sclera: Conjunctivae normal.  Neck:  Trachea: No tracheal deviation.  Cardiovascular:     Rate and Rhythm: Normal rate and regular rhythm.     Pulses: Normal pulses.     Heart sounds: Normal heart sounds. No murmur heard.   No friction rub. No gallop.  Pulmonary:     Effort: Pulmonary effort is normal. No accessory muscle usage or respiratory distress.     Breath sounds: Normal breath sounds.     Comments: Mild chest wall tenderness. No crepitus.  Chest:     Chest wall: Tenderness present.  Abdominal:     General: Bowel sounds are normal. There is no distension.     Palpations: Abdomen is soft. There is no mass.     Tenderness: There is no abdominal tenderness. There is no guarding or rebound.     Hernia: No hernia is present.  Genitourinary:    Comments: No cva tenderness. Musculoskeletal:        General: No swelling or tenderness.     Cervical back: Normal range of motion and neck supple. No rigidity.     Right lower leg: No edema.     Left lower leg: No edema.  Skin:    General: Skin is warm and dry.     Findings: No rash.  Neurological:     Mental Status: He is alert.     Comments: Alert, speech clear.   Psychiatric:        Mood and Affect: Mood normal.    ED Results / Procedures / Treatments   Labs (all labs ordered  are listed, but only abnormal results are displayed) Results for orders placed or performed during the hospital encounter of 08/11/21  CBC  Result Value Ref Range   WBC 8.7 4.0 - 10.5 K/uL   RBC 4.72 4.22 - 5.81 MIL/uL   Hemoglobin 15.5 13.0 - 17.0 g/dL   HCT 45.4 39.0 - 52.0 %   MCV 96.2 80.0 - 100.0 fL   MCH 32.8 26.0 - 34.0 pg   MCHC 34.1 30.0 - 36.0 g/dL   RDW 12.1 11.5 - 15.5 %   Platelets 262 150 - 400 K/uL   nRBC 0.0 0.0 - 0.2 %  Comprehensive metabolic panel  Result Value Ref Range   Sodium 140 135 - 145 mmol/L   Potassium 4.1 3.5 - 5.1 mmol/L   Chloride 106 98 - 111 mmol/L   CO2 28 22 - 32 mmol/L   Glucose, Bld 98 70 - 99 mg/dL   BUN 15 6 - 20 mg/dL   Creatinine, Ser 1.30 (H) 0.61 - 1.24 mg/dL   Calcium 8.8 (L) 8.9 - 10.3 mg/dL   Total Protein 7.1 6.5 - 8.1 g/dL   Albumin 3.9 3.5 - 5.0 g/dL   AST 19 15 - 41 U/L   ALT 13 0 - 44 U/L   Alkaline Phosphatase 87 38 - 126 U/L   Total Bilirubin 1.0 0.3 - 1.2 mg/dL   GFR, Estimated >60 >60 mL/min   Anion gap 6 5 - 15  Rapid urine drug screen (hospital performed)  Result Value Ref Range   Opiates NONE DETECTED NONE DETECTED   Cocaine POSITIVE (A) NONE DETECTED   Benzodiazepines NONE DETECTED NONE DETECTED   Amphetamines NONE DETECTED NONE DETECTED   Tetrahydrocannabinol POSITIVE (A) NONE DETECTED   Barbiturates NONE DETECTED NONE DETECTED  Troponin I (High Sensitivity)  Result Value Ref Range   Troponin I (High Sensitivity) 3 <18 ng/L  Troponin I (High Sensitivity)  Result Value Ref Range  Troponin I (High Sensitivity) 3 <18 ng/L   DG Chest Portable 1 View  Result Date: 08/11/2021 CLINICAL DATA:  50 year old male with chest pain. EXAM: PORTABLE CHEST - 1 VIEW COMPARISON:  07/04/2021 FINDINGS: The mediastinal contours are within normal limits. No cardiomegaly. The lungs are clear bilaterally without evidence of focal consolidation, pleural effusion, or pneumothorax. No acute osseous abnormality. IMPRESSION: No acute  cardiopulmonary process. Electronically Signed   By: Ruthann Cancer M.D.   On: 08/11/2021 11:33   LONG TERM MONITOR (3-14 DAYS)  Result Date: 07/20/2021  7 day monitor  Rare supraventricular ectopy in the form of isolated PACs. Isolated run of SVT lasting 11 beats  Rare ventricular ectopy in the form of isolated PVCs  Reported symptoms correlalted with sinus rhythm  Patch Wear Time:  6 days and 16 hours (2022-09-08T11:06:33-0400 to 2022-09-15T03:46:47-0400) Patient had a min HR of 37 bpm, max HR of 148 bpm, and avg HR of 70 bpm. Predominant underlying rhythm was Sinus Rhythm. 1 run of Supraventricular Tachycardia occurred lasting 11 beats with a max rate of 126 bpm (avg 113 bpm). Isolated SVEs were rare (<1.0%), and no SVE Couplets or SVE Triplets were present. Isolated VEs were rare (<1.0%), and no VE Couplets or VE Triplets were present. Inverted QRS complexes possibly due to inverted placement of device.    EKG EKG Interpretation  Date/Time:  Saturday August 11 2021 10:11:01 EDT Ventricular Rate:  65 PR Interval:  147 QRS Duration: 96 QT Interval:  400 QTC Calculation: 416 R Axis:   -33 Text Interpretation: Sinus rhythm Left axis deviation Low voltage, extremity and precordial leads Confirmed by Lajean Saver [phone]) on 08/11/2021 10:17:35 AM  Radiology DG Chest Portable 1 View  Result Date: 08/11/2021 CLINICAL DATA:  50 year old male with chest pain. EXAM: PORTABLE CHEST - 1 VIEW COMPARISON:  07/04/2021 FINDINGS: The mediastinal contours are within normal limits. No cardiomegaly. The lungs are clear bilaterally without evidence of focal consolidation, pleural effusion, or pneumothorax. No acute osseous abnormality. IMPRESSION: No acute cardiopulmonary process. Electronically Signed   By: Ruthann Cancer M.D.   On: 08/11/2021 11:33    Procedures Procedures   Medications Ordered in ED Medications  famotidine (PEPCID) tablet 20 mg (20 mg Oral Given 08/11/21 1103)  alum & mag  hydroxide-simeth (MAALOX/MYLANTA) 200-200-20 MG/5ML suspension 30 mL (30 mLs Oral Given 08/11/21 1103)  acetaminophen (TYLENOL) tablet 1,000 mg (1,000 mg Oral Given 08/11/21 1103)    ED Course  I have reviewed the triage vital signs and the nursing notes.  Pertinent labs & imaging results that were available during my care of the patient were reviewed by me and considered in my medical decision making (see chart for details).    MDM Rules/Calculators/A&P                           Iv ns. Continuous pulse ox and cardiac monitoring. Stat labs and imaging.   Reviewed nursing notes and prior charts for additional history. Prior cardiac ct without significant obstructive dis.   Acetaminophen po, pepcid po, maalox, for symptom relief.   Labs reviewed/interpreted by me - trop normal. Uds +cocaine.   Recheck no cp.   CXR reviewed/interpreted by me - no pna.   Additional labs reviewed/interpreted by me - delta trop normal.   No cp or sob. Vitals normal.   Pt currently appears stable for d/c.   Rec pcp/card f/u.   Return precautions provided.  Final Clinical Impression(s) / ED Diagnoses Final diagnoses:  None    Rx / DC Orders ED Discharge Orders     None            Lajean Saver, MD 08/11/21 1320

## 2021-08-11 NOTE — ED Triage Notes (Signed)
Pt to the ED with complaints of chest paibn that began yesterday with N/V.  Pt sates the pain is also in his shoulders.

## 2021-08-11 NOTE — Discharge Instructions (Addendum)
It was our pleasure to provide your ER care today - we hope that you feel better.  Drink plenty of fluids/stay well hydrated.   If reflux/gi symptoms, try pepcid or maalox as need.  Take acetaminophen as need.   Make sure to avoid any cocaine use - cocaine use can cause heart attacks and strokes.   Follow up with primary care doctor and cardiologist in the next 1-2 weeks.   Return to ER if worse, new symptoms, recurrent or persistent chest pain, increased trouble breathing, persistent vomiting, new/severe pain, or other concern.

## 2021-08-11 NOTE — ED Notes (Signed)
Dc instructions reviewed with pt no questions or concerns at this time. Pt refused wheelchair for transport out of department.

## 2021-09-10 ENCOUNTER — Telehealth (INDEPENDENT_AMBULATORY_CARE_PROVIDER_SITE_OTHER): Payer: Self-pay

## 2021-09-10 ENCOUNTER — Other Ambulatory Visit (INDEPENDENT_AMBULATORY_CARE_PROVIDER_SITE_OTHER): Payer: Self-pay | Admitting: Nurse Practitioner

## 2021-09-10 DIAGNOSIS — I82511 Chronic embolism and thrombosis of right femoral vein: Secondary | ICD-10-CM

## 2021-09-10 NOTE — Telephone Encounter (Signed)
We can place a referral to the [facility] in [place]

## 2021-09-10 NOTE — Telephone Encounter (Signed)
Patient called in stating that he has suffered  from blot clot in groin area and has had it for years. Stating the provider has given him blood thinners and they are not working. He states the provider has flat out told him there nothing he can do for him besides the medication and he wants a referral to another vein and vascular. If anyone needs to contact him back his phone is not working to good asking to leave a message if he doesn't answer     Please call and advise

## 2021-09-10 NOTE — Telephone Encounter (Signed)
Patient was made aware that requesting referral has been placed

## 2021-09-11 ENCOUNTER — Encounter: Payer: Self-pay | Admitting: Neurology

## 2021-09-11 ENCOUNTER — Ambulatory Visit: Payer: Medicaid Other | Admitting: Neurology

## 2021-09-11 ENCOUNTER — Other Ambulatory Visit: Payer: Self-pay

## 2021-09-11 VITALS — BP 162/84 | HR 68 | Ht 71.0 in | Wt 222.0 lb

## 2021-09-11 DIAGNOSIS — R55 Syncope and collapse: Secondary | ICD-10-CM

## 2021-09-11 MED ORDER — SUMATRIPTAN SUCCINATE 50 MG PO TABS: 50.0000 mg | ORAL_TABLET | ORAL | 0 refills | Status: DC | PRN

## 2021-09-11 NOTE — Progress Notes (Signed)
[facility]  PATIENT: Lance Cohen DOB: 1971-08-14  REQUESTING CLINICIAN: Charlie Pitter, PA-C HISTORY FROM: Patient  REASON FOR VISIT: Syncope /rule out seizure    HISTORICAL  CHIEF COMPLAINT:  Chief Complaint  Patient presents with   New Patient (Initial Visit)    Rm 12. Alone. NP/internal referral for recurrent loss of consciousness, r/o seizure.    HISTORY OF PRESENT ILLNESS:  This is a 50 year old gentleman past medical history of leg DVT, asthma, HTN, OSA, bradycardia who is presenting after syncopal episode in September.  Per chart review, admission note stated that patient reported that he was with his wife when he became unresponsive.  He appeared blue per EMS.  He was bagged for about 4-minutes then he spontaneously woke up.  Patient does not recall anything other than that he was feeling about to throw up.  No pain, no loss of bowel or bladder, no abnormal movement noted.  He was discharged home with close follow-up to cardiology. He reports that he does not remember much about the admission.  Patient had a history of near syncope in the past, at one point he was hospitalized for chest pain and was noted to be bradycardic to the 30s and 40s.  Since then, he has been following with cardiology.  At that time his labs/urine drug screen was positive for cocaine and THC. Since leaving the hospital he has not had any additional syncope or near syncope.  He denies any previous history of seizures, but stated sister and uncle had seizures.  Reported history of head trauma and denies LOC.   OTHER MEDICAL CONDITIONS: History of blood clot, Asthma, HTN, OSA   REVIEW OF SYSTEMS: Full 14 system review of systems performed and negative with exception of: as noted in the HPI   ALLERGIES: Allergies  Allergen Reactions   Bee Venom Swelling   Penicillins Other (See Comments)    PT states that he had symptoms that felt like he was having a heart attack Has patient  had a PCN reaction causing immediate rash, facial/tongue/throat swelling, SOB or lightheadedness with hypotension: No Has patient had a PCN reaction causing severe rash involving mucus membranes or skin necrosis: No Has patient had a PCN reaction that required hospitalization No Has patient had a PCN reaction occurring within the last 10 years: No If all of the above answers are "NO", then may proceed with Cephal   Zofran [Ondansetron] Hives   Other Palpitations    Steroiod   Sulfa Antibiotics Itching    HOME MEDICATIONS: Outpatient Medications Prior to Visit  Medication Sig Dispense Refill   albuterol (VENTOLIN HFA) 108 (90 Base) MCG/ACT inhaler Inhale 1-2 puffs into the lungs every 6 (six) hours as needed for wheezing or shortness of breath. 6.7 g 0   allopurinol (ZYLOPRIM) 100 MG tablet Take 100 mg by mouth daily.     EPINEPHrine 0.3 mg/0.3 mL IJ SOAJ injection Inject 0.3 mg into the muscle as needed for anaphylaxis (For severe allergic reaction only with difficulty breathing, tongue swelling or throat swelling.). 1 each 0   QUEtiapine (SEROQUEL) 25 MG tablet Take 25 mg by mouth at bedtime.     rivaroxaban (XARELTO) 20 MG TABS tablet Take 1 tablet (20 mg total) by mouth daily with breakfast. 30 tablet 11   tamsulosin (FLOMAX) 0.4 MG CAPS capsule Take 0.4 mg by mouth daily.     pantoprazole (PROTONIX) 20 MG tablet Take 1 tablet (20 mg total) by mouth daily. [place]  tablet 1   No facility-administered medications prior to visit.    PAST MEDICAL HISTORY: Past Medical History:  Diagnosis Date   Anxiety    Arthritis    Asthma    Bipolar 1 disorder ([place])    Chronic knee pain    Depression    Dilated aortic root (HCC)    DVT (deep venous thrombosis) ([place])    Hypertension    Insomnia    Sinus bradycardia    Sleep apnea    Spinal stenosis    Suicide attempt ([place])    2010 Intentional overdose attempt with Depakote.    PAST SURGICAL HISTORY: Past Surgical History:  Procedure  Laterality Date   COLONOSCOPY WITH PROPOFOL N/A 03/18/2019   Procedure: COLONOSCOPY WITH PROPOFOL;  Surgeon: [name], MD;  [facility];  Service: Endoscopy;  Laterality: N/A;  2:30pm   FOOT SURGERY     had peice of wire removed   IVC FILTER REMOVAL N/A 02/24/2017   Procedure: IVC Filter Removal;  Surgeon: [name], MD;  Location: [place] [facility];  Service: Cardiovascular;  Laterality: N/A;   ORIF RADIAL FRACTURE Right 12/05/2020   Procedure: OPEN REDUCTION INTERNAL FIXATION (ORIF) RADIAL FRACTURE and ULNA FRACTURE;  Surgeon: Carole Civil, MD;  Location: [facility];  Service: Orthopedics;  Laterality: Right;   PERIPHERAL VASCULAR CATHETERIZATION Right 10/22/2016   Procedure: Thrombectomy, thrombolyisis;  Surgeon: [name], MD;  Location: [place];  Service: Cardiovascular;  Laterality: Right;   PERIPHERAL VASCULAR CATHETERIZATION N/A 10/22/2016   Procedure: IVC Filter Insertion;  Surgeon: [name], MD;  Location: [facility];  Service: Cardiovascular;  Laterality: N/A;   POLYPECTOMY  03/18/2019   Procedure: POLYPECTOMY;  Surgeon: [name], MD;  [facility]: [facility];  Service: Endoscopy;;    FAMILY HISTORY: Family History  Problem Relation Age of Onset   Cancer Mother    Heart attack Father    Colon cancer Neg Hx     SOCIAL HISTORY: Social History   Socioeconomic History   Marital status: Married    Spouse name: Not on file   Number of children: Not on file   Years of education: Not on file   Highest education level: Not on file  Occupational History   Not on file  Tobacco Use   Smoking status: Every Day    Packs/day: 0.50    Types: Cigarettes   Smokeless tobacco: Never  Vaping Use   Vaping Use: Never used  Substance and Sexual Activity   Alcohol use: Yes    Comment: seldom   Drug use: Yes    Types: Marijuana    Comment: smokes for pain   Sexual activity: Yes  Other Topics Concern   Not on file   Social History Narrative   Not on file   Social Determinants of Health   Financial Resource Strain: Not on file  Food Insecurity: Not on file  Transportation Needs: Not on file  Physical Activity: Not on file  Stress: Not on file  Social Connections: Not on file  Intimate Partner Violence: Not on file    PHYSICAL EXAM  GENERAL EXAM/CONSTITUTIONAL: Vitals:  Vitals:   09/11/21 1028  BP: (!) 162/84  Pulse: 68  Weight: 222 lb (100.7 kg)  Height: 5\' 11"  (1.803 m)   Body mass index is 30.96 kg/m. Wt Readings from Last 3 Encounters:  09/11/21 222 lb (100.7 kg)  08/11/21 220 lb (99.8 kg)  07/31/21 220 lb (99.8 kg)   Patient is in no distress; well developed, nourished and groomed; neck is supple   EYES: Pupils round and reactive to light, Visual fields full to confrontation, Extraocular movements intacts,   MUSCULOSKELETAL: Gait, strength, tone, movements noted in Neurologic exam below  NEUROLOGIC: MENTAL STATUS:  No flowsheet data found. awake, alert, oriented to person, place and time recent and remote memory intact normal attention and concentration language fluent, comprehension intact, naming intact fund of knowledge appropriate  CRANIAL NERVE:  2nd, 3rd, 4th, 6th - pupils equal and reactive to light, visual fields full to confrontation, extraocular muscles intact, no nystagmus 5th - facial sensation symmetric 7th - facial strength symmetric 8th - hearing intact 9th - palate elevates symmetrically, uvula midline 11th - shoulder shrug symmetric 12th - tongue protrusion midline  MOTOR:  normal bulk and tone, full strength in the BUE, BLE  SENSORY:  normal and symmetric to light touch, pinprick, temperature, vibration  COORDINATION:  finger-nose-finger, fine finger movements normal  REFLEXES:  deep tendon reflexes present and symmetric  GAIT/STATION:  normal   DIAGNOSTIC DATA (LABS, IMAGING, TESTING) - I reviewed patient records, labs, notes,  testing and imaging myself where available.  Lab Results  Component Value Date   WBC 8.7 08/11/2021   HGB 15.5 08/11/2021   HCT 45.4 08/11/2021   MCV 96.2 08/11/2021   PLT 262 08/11/2021      Component Value Date/Time   NA 140 08/11/2021 1047   NA 138 03/21/2021 1300   K 4.1 08/11/2021 1047   CL 106 08/11/2021 1047   CO2 28 08/11/2021 1047   GLUCOSE 98 08/11/2021 1047   BUN 15 08/11/2021 1047   BUN 8 03/21/2021 1300   CREATININE 1.30 (H) 08/11/2021 1047   CALCIUM 8.8 (L) 08/11/2021 1047   PROT 7.1 08/11/2021 1047   PROT 7.0 03/21/2021 1300   ALBUMIN 3.9 08/11/2021 1047   ALBUMIN 4.4 03/21/2021 1300   AST 19 08/11/2021 1047   ALT 13 08/11/2021 1047   ALKPHOS 87 08/11/2021 1047   BILITOT 1.0 08/11/2021 1047   BILITOT 0.6 03/21/2021 1300   GFRNONAA >60 08/11/2021 1047   GFRAA >60 07/03/2019 1345   Lab Results  Component Value Date   CHOL 120 10/19/2017   HDL 33 (L) 10/19/2017   LDLCALC 68 10/19/2017   TRIG 96 10/19/2017   CHOLHDL 3.6 10/19/2017   Lab Results  Component Value Date   HGBA1C 4.7 (L) 10/19/2017   Lab Results  Component Value Date   HQPRFFMB84 665 09/08/2020   Lab Results  Component Value Date   TSH 4.875 (H) 07/04/2021    Head CT 06/2021 Normal head CT.   ASSESSMENT AND PLAN  50 y.o. year old male with history of leg DVT, asthma, HTN, OSA, bradycardia who is presenting after syncopal episode in September.  He does not remember much from that event but was found to be positive for cocaine and THC.  He had previous episodes of syncope/presyncope but since that admission, he has follow-up with cardiology, has heart monitor which did not captured any arrhythmia.  He does have a family history of seizures but patient does not have a history of seizure.  There were no reported abnormal movements, tongue biting or urinary incontinence.  We will obtain the EEG for further study.  If abnormal I will bring him back for additional testing/plan otherwise he  can follow-up with his primary care doctor.   1. Syncope, unspecified syncope type  PLAN: Routine EEG  Return if worse   Orders Placed This Encounter  Procedures   EEG adult    Meds ordered this encounter  Medications   SUMAtriptan (IMITREX) 50 MG tablet    Sig: Take 1 tablet (50 mg total) by mouth every 2 (two) hours as needed for migraine. May repeat in 2 hours if headache persists or recurs.    Dispense:  10 tablet    Refill:  0    Return if symptoms worsen or fail to improve.    Alric Ran, MD 09/11/2021, 11:06 AM  [facility] [address] [place], [place] [address] [phone]

## 2021-09-11 NOTE — Patient Instructions (Signed)
Routine EEG  Return if worse

## 2021-09-17 ENCOUNTER — Other Ambulatory Visit: Payer: Medicaid Other | Admitting: *Deleted

## 2021-09-24 ENCOUNTER — Ambulatory Visit: Payer: Medicaid Other | Admitting: Neurology

## 2021-09-24 ENCOUNTER — Other Ambulatory Visit (INDEPENDENT_AMBULATORY_CARE_PROVIDER_SITE_OTHER): Payer: Self-pay | Admitting: Nurse Practitioner

## 2021-09-24 DIAGNOSIS — R55 Syncope and collapse: Secondary | ICD-10-CM

## 2021-09-24 DIAGNOSIS — I82511 Chronic embolism and thrombosis of right femoral vein: Secondary | ICD-10-CM

## 2021-09-24 NOTE — Telephone Encounter (Signed)
Sent!

## 2021-09-24 NOTE — Telephone Encounter (Signed)
Pt called highly upset because nobody has called him regarding the referral to [facility]. I explained to the patient that [facility] had called him once but [name], NP had put the referral in to [place] instead of [place] because he lives in [place]. Very upset, pt states that he will not go to a Dr. [place]. He would like it to be sent to [facility]. Stated that a mess has been made of this and he is in extreme pain. Please change referral to [facility].

## 2021-10-01 NOTE — Procedures (Signed)
    History:  43 yom with syncope vs. seizure  EEG classification:  Awake and asleep  Description of the recording: The background rhythms of this recording consists of a fairly well modulated medium amplitude background activity of 10-11 Hz. As the record progresses, the patient initially is in the waking state, but appears to enter the early stage II sleep during the recording, with rudimentary sleep spindles and vertex sharp wave activity seen. During the wakeful state, photic stimulation is performed, and no abnormal responses were seen. Hyperventilation was not performed. No epileptiform discharges seen during this recording. There was no focal slowing. EKG monitor shows no evidence of cardiac rhythm abnormalities with a heart rate of 60.  Impression: This is a normal EEG recording in the waking and sleeping state. No evidence interictal epileptiform discharges were seen at any time during the recording.  A normal EEG does not exclude a diagnosis of epilepsy.    Alric Ran, MD [facility]

## 2021-10-08 ENCOUNTER — Other Ambulatory Visit: Payer: Self-pay

## 2021-10-08 DIAGNOSIS — I82511 Chronic embolism and thrombosis of right femoral vein: Secondary | ICD-10-CM

## 2021-10-18 ENCOUNTER — Other Ambulatory Visit: Payer: Self-pay

## 2021-10-18 ENCOUNTER — Emergency Department (HOSPITAL_COMMUNITY)
Admission: EM | Admit: 2021-10-18 | Discharge: 2021-10-18 | Disposition: A | Discharge status: Home / Self Care | Payer: Medicaid Other | Attending: Emergency Medicine | Admitting: Emergency Medicine

## 2021-10-18 ENCOUNTER — Emergency Department (HOSPITAL_COMMUNITY): Payer: Medicaid Other

## 2021-10-18 ENCOUNTER — Encounter (HOSPITAL_COMMUNITY): Payer: Self-pay

## 2021-10-18 DIAGNOSIS — K219 Gastro-esophageal reflux disease without esophagitis: Secondary | ICD-10-CM | POA: Diagnosis not present

## 2021-10-18 DIAGNOSIS — Z79899 Other long term (current) drug therapy: Secondary | ICD-10-CM | POA: Insufficient documentation

## 2021-10-18 DIAGNOSIS — I824Y1 Acute embolism and thrombosis of unspecified deep veins of right proximal lower extremity: Secondary | ICD-10-CM

## 2021-10-18 DIAGNOSIS — Z7901 Long term (current) use of anticoagulants: Secondary | ICD-10-CM | POA: Diagnosis not present

## 2021-10-18 DIAGNOSIS — R2241 Localized swelling, mass and lump, right lower limb: Secondary | ICD-10-CM | POA: Diagnosis present

## 2021-10-18 DIAGNOSIS — M5416 Radiculopathy, lumbar region: Secondary | ICD-10-CM | POA: Diagnosis not present

## 2021-10-18 DIAGNOSIS — I82411 Acute embolism and thrombosis of right femoral vein: Secondary | ICD-10-CM | POA: Insufficient documentation

## 2021-10-18 DIAGNOSIS — I10 Essential (primary) hypertension: Secondary | ICD-10-CM | POA: Insufficient documentation

## 2021-10-18 DIAGNOSIS — F17210 Nicotine dependence, cigarettes, uncomplicated: Secondary | ICD-10-CM | POA: Diagnosis not present

## 2021-10-18 DIAGNOSIS — J45909 Unspecified asthma, uncomplicated: Secondary | ICD-10-CM | POA: Diagnosis not present

## 2021-10-18 LAB — URINALYSIS, ROUTINE W REFLEX MICROSCOPIC
Bilirubin Urine: NEGATIVE
Glucose, UA: NEGATIVE mg/dL
Hgb urine dipstick: NEGATIVE
Ketones, ur: NEGATIVE mg/dL
Nitrite: NEGATIVE
Protein, ur: 30 mg/dL — AB
Specific Gravity, Urine: 1.021 (ref 1.005–1.030)
WBC, UA: >50 WBC/hpf — ABNORMAL HIGH (ref 0–5)
pH: 7 (ref 5.0–8.0)

## 2021-10-18 LAB — CBC WITH DIFFERENTIAL/PLATELET
Abs Immature Granulocytes: 0.04 10*3/uL (ref 0.00–0.07)
Basophils Absolute: 0.1 10*3/uL (ref 0.0–0.1)
Basophils Relative: 1 %
Eosinophils Absolute: 0.2 10*3/uL (ref 0.0–0.5)
Eosinophils Relative: 2 %
HCT: 43.7 % (ref 39.0–52.0)
Hemoglobin: 15 g/dL (ref 13.0–17.0)
Immature Granulocytes: 0 %
Lymphocytes Relative: 32 %
Lymphs Abs: 3 10*3/uL (ref 0.7–4.0)
MCH: 33.9 pg (ref 26.0–34.0)
MCHC: 34.3 g/dL (ref 30.0–36.0)
MCV: 98.9 fL (ref 80.0–100.0)
Monocytes Absolute: 0.7 10*3/uL (ref 0.1–1.0)
Monocytes Relative: 8 %
Neutro Abs: 5.4 10*3/uL (ref 1.7–7.7)
Neutrophils Relative %: 57 %
Platelets: 281 10*3/uL (ref 150–400)
RBC: 4.42 MIL/uL (ref 4.22–5.81)
RDW: 12.6 % (ref 11.5–15.5)
WBC: 9.4 10*3/uL (ref 4.0–10.5)
nRBC: 0 % (ref 0.0–0.2)

## 2021-10-18 LAB — BASIC METABOLIC PANEL
Anion gap: 9 (ref 5–15)
BUN: 18 mg/dL (ref 6–20)
CO2: 26 mmol/L (ref 22–32)
Calcium: 8.8 mg/dL — ABNORMAL LOW (ref 8.9–10.3)
Chloride: 102 mmol/L (ref 98–111)
Creatinine, Ser: 1.24 mg/dL (ref 0.61–1.24)
GFR, Estimated: >60 mL/min (ref >60)
Glucose, Bld: 93 mg/dL (ref 70–99)
Potassium: 4.2 mmol/L (ref 3.5–5.1)
Sodium: 137 mmol/L (ref 135–145)

## 2021-10-18 MED ORDER — ENOXAPARIN SODIUM 150 MG/ML IJ SOSY
150.0000 mg | PREFILLED_SYRINGE | Freq: Once | INTRAMUSCULAR | Status: AC
  Administered 2021-10-18: 18:00:00 150 mg via SUBCUTANEOUS
  Filled 2021-10-18: qty 1

## 2021-10-18 MED ORDER — WARFARIN SODIUM 5 MG PO TABS: 5.0000 mg | ORAL_TABLET | Freq: Every day | ORAL | 0 refills | Status: DC

## 2021-10-18 MED ORDER — ENOXAPARIN SODIUM 150 MG/ML IJ SOSY: 150.0000 mg | PREFILLED_SYRINGE | INTRAMUSCULAR | 0 refills | Status: DC

## 2021-10-18 MED ORDER — OXYCODONE-ACETAMINOPHEN 5-325 MG PO TABS
1.0000 | ORAL_TABLET | Freq: Once | ORAL | Status: AC
  Administered 2021-10-18: 17:00:00 1 via ORAL
  Filled 2021-10-18: qty 1

## 2021-10-18 NOTE — Discharge Instructions (Signed)
Your ultrasound today shows that you have 2 new blood clots in your leg.  Please discontinue the Xarelto and start the Coumadin tonight.  Administer the Lovenox injections starting tomorrow.  Please keep your appointment on January 3 with your vascular surgeon.  Take the medication as directed until you are seen.  You will need to have your Coumadin level rechecked Monday or Tuesday of next week.  Your primary care provider can arrange this for you.

## 2021-10-18 NOTE — ED Triage Notes (Signed)
Reports back pain is chronic but pain is worse. Hx of stenosis.  Denies bowel or bladder incontinence

## 2021-10-18 NOTE — ED Provider Notes (Signed)
[facility] EMERGENCY DEPARTMENT Provider Note   CSN: [id number] Arrival date & time: 10/18/21  1321     History Chief Complaint  Patient presents with   Back Pain    Lance Cohen is a 50 y.o. male.   Back Pain Associated symptoms: no abdominal pain, no chest pain, no dysuria, no fever, no numbness and no weakness        Lance Cohen is a 50 y.o. male with history of chronic low back pain, right DVT, HTN,  chronically anticoagulated on Xarelto who presents to the Emergency Department complaining of back of his lower back and pain and swelling to his right groin and medial right thigh.  Pain has been present for some time, but gradually worsening for few days.  Pain worse with walking and certain movements.  Denies recent injury, pain, numbness or weakness radiating down his leg, abdominal pain, dysuria, and pain or swelling of his testicles, or scrotum.  No fever, chest pain or shortness of breath   Past Medical History:  Diagnosis Date   Anxiety    Arthritis    Asthma    Bipolar 1 disorder (HCC)    Chronic knee pain    Depression    Dilated aortic root (HCC)    DVT (deep venous thrombosis) ([place])    Hypertension    Insomnia    Sinus bradycardia    Sleep apnea    Spinal stenosis    Suicide attempt ([place])    2010 Intentional overdose attempt with Depakote.    Patient Active Problem List   Diagnosis Date Noted   Syncope 07/04/2021   Bradycardia 06/16/2021   Closed fracture of right distal radius and ulna 12/21/2020   Closed fracture of right forearm    Daily headache 11/27/2020   Dizzy spells 11/27/2020   Seizure ([place]) 11/27/2020   Myofascial pain dysfunction syndrome 11/22/2020   Cocaine abuse ([place]) 11/18/2020   Foreign body (FB) in soft tissue 09/08/2020   Chronic low back pain 05/11/2020   Shoulder pain 05/11/2020   Headache 05/11/2020   Cervical radiculopathy 02/18/2020   Impingement syndrome of right shoulder 01/18/2020   Rectal pain 06/29/2019    GERD (gastroesophageal reflux disease) 06/29/2019   Rectal bleeding 02/22/2019   Priapism 02/08/2019   Chest pain 10/19/2017   Asthma 10/19/2017   Hypertension 10/19/2017   Arthralgia of right knee 10/19/2017   Pain in limb 12/27/2016   History of pulmonary embolus (PE) 10/23/2016   Nausea and vomiting 10/23/2016   S/P IVC filter 10/23/2016   Tobacco abuse counseling 10/23/2016   Chronic deep vein thrombosis (DVT) ([place]) 10/18/2016   Cellulitis of foot 06/24/2011   Bipolar 1 disorder ([place]) 06/24/2011   Tobacco abuse 06/24/2011   Polycythemia 06/24/2011   Tinea pedis 06/24/2011   CARPAL TUNNEL SYNDROME, RIGHT 06/21/2010   CONTUSION, LEFT FOOT 06/05/2010   FOREIGN BODY, FOOT 04/17/2010    Past Surgical History:  Procedure Laterality Date   COLONOSCOPY WITH PROPOFOL N/A 03/18/2019   Procedure: COLONOSCOPY WITH PROPOFOL;  Surgeon: [name], MD;  [facility];  Service: Endoscopy;  Laterality: N/A;  2:30pm   FOOT SURGERY     had peice of wire removed   IVC FILTER REMOVAL N/A 02/24/2017   Procedure: IVC Filter Removal;  Surgeon: [name], MD;  Location: [place] [facility];  Service: Cardiovascular;  Laterality: N/A;   ORIF RADIAL FRACTURE Right 12/05/2020   Procedure: OPEN REDUCTION INTERNAL FIXATION (ORIF) RADIAL FRACTURE  and ULNA FRACTURE;  Surgeon: Carole Civil, MD;  Location: [facility] ORS;  Service: Orthopedics;  Laterality: Right;   PERIPHERAL VASCULAR CATHETERIZATION Right 10/22/2016   Procedure: Thrombectomy, thrombolyisis;  Surgeon: [name], MD;  Location: [place] [facility];  Service: Cardiovascular;  Laterality: Right;   PERIPHERAL VASCULAR CATHETERIZATION N/A 10/22/2016   Procedure: IVC Filter Insertion;  Surgeon: [name], MD;  Location: [place] [facility];  Service: Cardiovascular;  Laterality: N/A;   POLYPECTOMY  03/18/2019   Procedure: POLYPECTOMY;  Surgeon: [name], MD;  Location: [facility];  Service: Endoscopy;;        Family History  Problem Relation Age of Onset   Cancer Mother    Heart attack Father    Colon cancer Neg Hx     Social History   Tobacco Use   Smoking status: Every Day    Packs/day: 0.50    Types: Cigarettes   Smokeless tobacco: Never  Vaping Use   Vaping Use: Never used  Substance Use Topics   Alcohol use: Yes    Comment: seldom   Drug use: Yes    Types: Marijuana    Comment: smokes for pain    Home Medications Prior to Admission medications   Medication Sig Start Date End Date Taking? Authorizing Provider  albuterol (VENTOLIN HFA) 108 (90 Base) MCG/ACT inhaler Inhale 1-2 puffs into the lungs every 6 (six) hours as needed for wheezing or shortness of breath. 01/17/21   Dorie Rank, MD  allopurinol (ZYLOPRIM) 100 MG tablet Take 100 mg by mouth daily. 08/31/21   [provider]  EPINEPHrine 0.3 mg/0.3 mL IJ SOAJ injection Inject 0.3 mg into the muscle as needed for anaphylaxis (For severe allergic reaction only with difficulty breathing, tongue swelling or throat swelling.). 04/08/21   Rancour, Annie Main, MD  QUEtiapine (SEROQUEL) 25 MG tablet Take 25 mg by mouth at bedtime. 08/30/21   [provider]  rivaroxaban (XARELTO) 20 MG TABS tablet Take 1 tablet (20 mg total) by mouth daily with breakfast. 04/09/21   Schnier, Dolores Lory, MD  SUMAtriptan (IMITREX) 50 MG tablet Take 1 tablet (50 mg total) by mouth every 2 (two) hours as needed for migraine. May repeat in 2 hours if headache persists or recurs. 09/11/21   Alric Ran, MD  tamsulosin (FLOMAX) 0.4 MG CAPS capsule Take 0.4 mg by mouth daily. 08/31/21   [provider]    Allergies    Bee venom, Penicillins, Zofran [ondansetron], Other, and Sulfa antibiotics  Review of Systems   Review of Systems  Constitutional:  Negative for chills, fatigue and fever.  Respiratory:  Negative for cough, shortness of breath and wheezing.   Cardiovascular:  Negative for chest pain.  Gastrointestinal:   Negative for abdominal pain, nausea and vomiting.  Genitourinary:  Negative for decreased urine volume, difficulty urinating, dysuria, flank pain, hematuria, penile swelling, scrotal swelling and testicular pain.       Right groin pain  Musculoskeletal:  Positive for back pain. Negative for arthralgias and myalgias.       Low back pain, pain and swelling of right medial thigh  Skin:  Negative for rash.  Neurological:  Negative for dizziness, weakness and numbness.  All other systems reviewed and are negative.  Physical Exam Updated Vital Signs BP 127/87 (BP Location: Right Arm)    Pulse (!) 56    Temp 97.7 F (36.5 C) (Oral)    Resp 18    Ht 5\' 11"  (1.803 m)  Wt 99.8 kg    SpO2 100%    BMI 30.68 kg/m   Physical Exam Vitals and nursing note reviewed.  Constitutional:      General: He is not in acute distress.    Appearance: Normal appearance. He is not ill-appearing.  HENT:     Head: Atraumatic.  Cardiovascular:     Rate and Rhythm: Normal rate and regular rhythm.     Pulses: Normal pulses.  Pulmonary:     Effort: Pulmonary effort is normal.     Breath sounds: Normal breath sounds. No wheezing.  Abdominal:     Palpations: Abdomen is soft.     Tenderness: There is no abdominal tenderness. There is no right CVA tenderness, guarding or rebound.  Musculoskeletal:        General: Normal range of motion.     Right lower leg: No edema.     Left lower leg: No edema.     Comments: Ttp of the right lumbar paraspinal muscles and right anterior and medial thigh.  Compartments soft.    Skin:    General: Skin is warm and dry.     Capillary Refill: Capillary refill takes less than 2 seconds.     Findings: No rash.  Neurological:     General: No focal deficit present.     Mental Status: He is alert.     Sensory: No sensory deficit.     Motor: No weakness.    ED Results / Procedures / Treatments   Labs (all labs ordered are listed, but only abnormal results are displayed) Labs  Reviewed  BASIC METABOLIC PANEL - Abnormal; Notable for the following components:      Result Value   Calcium 8.8 (*)    All other components within normal limits  URINALYSIS, ROUTINE W REFLEX MICROSCOPIC - Abnormal; Notable for the following components:   APPearance HAZY (*)    Protein, ur 30 (*)    Leukocytes,Ua LARGE (*)    WBC, UA >50 (*)    Bacteria, UA RARE (*)    All other components within normal limits  CBC WITH DIFFERENTIAL/PLATELET    EKG None  Radiology US Venous Img Lower Bilateral  Result Date: 10/18/2021 CLINICAL DATA:  Bilateral groin pain Prior history of DVT EXAM: BILATERAL LOWER EXTREMITY VENOUS DOPPLER ULTRASOUND TECHNIQUE: Gray-scale sonography with graded compression, as well as color Doppler and duplex ultrasound were performed to evaluate the lower extremity deep venous systems from the level of the common femoral vein and including the common femoral, femoral, profunda femoral, popliteal and calf veins including the posterior tibial, peroneal and gastrocnemius veins when visible. The superficial great saphenous vein was also interrogated. Spectral Doppler was utilized to evaluate flow at rest and with distal augmentation maneuvers in the common femoral, femoral and popliteal veins. COMPARISON:  01/17/2021 FINDINGS: RIGHT LOWER EXTREMITY Common Femoral Vein: No evidence of thrombus. Normal compressibility, respiratory phasicity and response to augmentation. Saphenofemoral Junction: No evidence of thrombus. Normal compressibility and flow on color Doppler imaging. Profunda Femoral Vein: Acute thrombus.  Noncompressible. Femoral Vein: Proximal segment is thrombosed and noncompressible. Mid segment is incompletely compressible with eccentric thrombus consistent with chronic DVT. Distal segment is patent. Popliteal Vein: No evidence of thrombus. Normal compressibility, respiratory phasicity and response to augmentation. Calf Veins: Acute thrombosis of the posterior tibial  and peroneal veins. Superficial Great Saphenous Vein: No evidence of thrombus. Normal compressibility. Other Findings:  None. LEFT LOWER EXTREMITY Common Femoral Vein: No evidence of thrombus. Normal compressibility,  respiratory phasicity and response to augmentation. Saphenofemoral Junction: No evidence of thrombus. Normal compressibility and flow on color Doppler imaging. Profunda Femoral Vein: No evidence of thrombus. Normal compressibility and flow on color Doppler imaging. Femoral Vein: No evidence of thrombus. Normal compressibility, respiratory phasicity and response to augmentation. Popliteal Vein: No evidence of thrombus. Normal compressibility, respiratory phasicity and response to augmentation. Calf Veins: No evidence of thrombus. Normal compressibility and flow on color Doppler imaging. Superficial Great Saphenous Vein: No evidence of thrombus. Normal compressibility. Other Findings:  None. IMPRESSION: 1. Acute DVT of the right profunda femoris and proximal femoral vein. 2. 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 3. Acute thrombosis of the right posterior tibial and peroneal veins. 4. No left lower extremity DVT. Electronically Signed   By: Miachel Roux M.D.   On: 10/18/2021 15:51   CT Renal Stone Study  Result Date: 10/18/2021 CLINICAL DATA:  Right flank pain x1 day with dark urine. EXAM: CT ABDOMEN AND PELVIS WITHOUT CONTRAST TECHNIQUE: Multidetector CT imaging of the abdomen and pelvis was performed following the standard protocol without IV contrast. COMPARISON:  July 08, 2021 FINDINGS: Lower chest: No acute abnormality. Hepatobiliary: No suspicious hepatic lesion on this noncontrast examination. Gallbladder is unremarkable. No biliary ductal dilation. Pancreas: No pancreatic ductal dilation or evidence of acute inflammation. Spleen: Normal in size without focal abnormality. Adrenals/Urinary Tract: Bilateral adrenal glands appear normal. No hydronephrosis. No renal, ureteral or bladder  calculi identified. Urinary bladder is decompressed limiting evaluation. Pelvic phleboliths. Stomach/Bowel: No enteric contrast was administered. Stomach is unremarkable for degree of distension. Periampullary duodenal diverticulum. No pathologic dilation of large or small bowel. The appendix and terminal ileum appear normal. Descending and sigmoid colonic diverticulosis without findings of acute diverticulitis. Vascular/Lymphatic: No abdominal aortic aneurysm. No pathologically enlarged abdominal or pelvic lymph nodes. Reproductive: Prostate is unremarkable. Other: No abdominopelvic free fluid.  No pneumoperitoneum. Musculoskeletal: Multilevel degenerative changes spine. No acute osseous abnormality. Mild degenerative changes bilateral hips. IMPRESSION: 1. No acute abdominopelvic findings. Specifically, no evidence of obstructive uropathy. 2. Descending and sigmoid colonic diverticulosis without findings of acute diverticulitis. Electronically Signed   By: Dahlia Bailiff M.D.   On: 10/18/2021 15:44    Procedures Procedures   Medications Ordered in ED Medications  oxyCODONE-acetaminophen (PERCOCET/ROXICET) 5-325 MG per tablet 1 tablet (1 tablet Oral Given 10/18/21 1713)    ED Course  I have reviewed the triage vital signs and the nursing notes.  Pertinent labs & imaging results that were available during my care of the patient were reviewed by me and considered in my medical decision making (see chart for details).    MDM Rules/Calculators/A&P                          Pt here with low back pain and pain swelling of the right thigh.  No known injury.  Hx of DVT and chronically anticoagulated on Xarelto.  No chest pain or dyspnea.    On exam, pt well appearing, non toxic. Ambulatory with steady gait.  No focal neuro deficits.  He has tenderness of the anterior and medial right thigh.  No tenderness distally.  Vitals reassuring.  No respiratory distress. No rash.  Exam concerning for new DVT.   Doubt PE, differential also includes kidney stone, acute on chronic low back pain, radiculopathy    CT stone study w/o acute finding, US imaging shows acute DVTs of the right profunda femoris and prox femoral vein  Pt has two new DVTs while anticoagulated on Xarelto.  Has been on the medication for 4 years.  He has upcoming appt with vascular surgery in [facility] on Jan 3.  Pt may need change in current therapy.  Will consult hematology for further guidance.   Discussed findings with Dr. Delton Coombes who recommends Lovenox 150 mg once daily and Coumadin 5 mg starting tonight with INR check Monday or Tuesday of next week.  Pt agreeable to plan, will keep vascular appt.  Lovenox given here and pt given instructions for self administration.   Pt comfortable with self administration.  Appropriate for d/c home.  Return precautions discussed.        Final Clinical Impression(s) / ED Diagnoses Final diagnoses:  Acute deep vein thrombosis (DVT) of proximal vein of right lower extremity [facility])    Rx / DC Orders ED Discharge Orders     None        Bufford Lope 10/19/21 2119    [name], MD 10/20/21 1123

## 2021-10-21 ENCOUNTER — Other Ambulatory Visit: Payer: Self-pay

## 2021-10-21 ENCOUNTER — Emergency Department (HOSPITAL_COMMUNITY)
Admission: EM | Admit: 2021-10-21 | Discharge: 2021-10-21 | Disposition: A | Discharge status: Home / Self Care | Payer: Medicaid Other | Attending: Emergency Medicine | Admitting: Emergency Medicine

## 2021-10-21 ENCOUNTER — Encounter (HOSPITAL_COMMUNITY): Payer: Self-pay

## 2021-10-21 DIAGNOSIS — Z7901 Long term (current) use of anticoagulants: Secondary | ICD-10-CM | POA: Insufficient documentation

## 2021-10-21 DIAGNOSIS — R21 Rash and other nonspecific skin eruption: Secondary | ICD-10-CM | POA: Diagnosis present

## 2021-10-21 DIAGNOSIS — T7840XA Allergy, unspecified, initial encounter: Secondary | ICD-10-CM

## 2021-10-21 DIAGNOSIS — I824Y2 Acute embolism and thrombosis of unspecified deep veins of left proximal lower extremity: Secondary | ICD-10-CM

## 2021-10-21 DIAGNOSIS — G5600 Carpal tunnel syndrome, unspecified upper limb: Secondary | ICD-10-CM

## 2021-10-21 HISTORY — DX: Carpal tunnel syndrome, unspecified upper limb: G56.00

## 2021-10-21 MED ORDER — PREDNISONE 10 MG PO TABS: 20.0000 mg | ORAL_TABLET | Freq: Two times a day (BID) | ORAL | 0 refills | Status: DC

## 2021-10-21 MED ORDER — DIPHENHYDRAMINE HCL 50 MG/ML IJ SOLN
25.0000 mg | Freq: Once | INTRAMUSCULAR | Status: AC
  Administered 2021-10-21: 25 mg via INTRAVENOUS
  Filled 2021-10-21: qty 1

## 2021-10-21 MED ORDER — APIXABAN 5 MG PO TABS: 5.0000 mg | ORAL_TABLET | Freq: Two times a day (BID) | ORAL | Status: DC

## 2021-10-21 MED ORDER — FAMOTIDINE IN NACL 20-0.9 MG/50ML-% IV SOLN
20.0000 mg | Freq: Once | INTRAVENOUS | Status: AC
Start: 2021-10-21 — End: 2021-10-21
  Administered 2021-10-21: 20 mg via INTRAVENOUS
  Filled 2021-10-21: qty 50

## 2021-10-21 MED ORDER — EPINEPHRINE 0.3 MG/0.3ML IJ SOAJ
0.3000 mg | Freq: Once | INTRAMUSCULAR | Status: AC
  Administered 2021-10-21: 0.3 mg via INTRAMUSCULAR
  Filled 2021-10-21: qty 0.3

## 2021-10-21 MED ORDER — METHYLPREDNISOLONE SODIUM SUCC 125 MG IJ SOLR
125.0000 mg | Freq: Once | INTRAMUSCULAR | Status: AC
  Administered 2021-10-21: 125 mg via INTRAVENOUS
  Filled 2021-10-21: qty 2

## 2021-10-21 MED ORDER — APIXABAN 5 MG PO TABS
10.0000 mg | ORAL_TABLET | Freq: Two times a day (BID) | ORAL | Status: DC
  Administered 2021-10-21: 10 mg via ORAL
  Filled 2021-10-21: qty 2

## 2021-10-21 MED ORDER — APIXABAN (ELIQUIS) VTE STARTER PACK (10MG AND 5MG): ORAL_TABLET | ORAL | 0 refills | Status: DC

## 2021-10-21 NOTE — ED Triage Notes (Addendum)
Pov from home with cc of allergic reaction from Lovenox injection. Gave it to himself 12/31 am but noticed the hives this evening.  Gives all over body. States his breathing is just a little harder than normal.    40oz beer tonight and smoked marijuana

## 2021-10-21 NOTE — ED Provider Notes (Signed)
[facility] EMERGENCY DEPARTMENT Provider Note   CSN: [id number] Arrival date & time: 10/21/21  0028     History  Chief Complaint  Patient presents with   Allergic Reaction    Lance Cohen is a 51 y.o. male.  Patient is a 51 year old male with history of prior DVT.  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  He was started on Coumadin and Lovenox.  Patient presenting today with generalized rash.  He has itching and hives all over his body.  He denies any difficulty breathing or swallowing.  He denies any throat swelling.   The history is provided by the patient.  Allergic Reaction Presenting symptoms: itching and rash   Presenting symptoms: no difficulty breathing and no difficulty swallowing   Severity:  Severe Relieved by:  Nothing Worsened by:  Nothing Ineffective treatments:  None tried     Home Medications Prior to Admission medications   Medication Sig Start Date End Date Taking? Authorizing Provider  albuterol (VENTOLIN HFA) 108 (90 Base) MCG/ACT inhaler Inhale 1-2 puffs into the lungs every 6 (six) hours as needed for wheezing or shortness of breath. 01/17/21   Dorie Rank, MD  allopurinol (ZYLOPRIM) 100 MG tablet Take 100 mg by mouth daily. 08/31/21   [provider]  enoxaparin (LOVENOX) 150 MG/ML injection Inject 1 mL (150 mg total) into the skin daily. Once a day 10/18/21   Triplett, Tammy, PA-C  EPINEPHrine 0.3 mg/0.3 mL IJ SOAJ injection Inject 0.3 mg into the muscle as needed for anaphylaxis (For severe allergic reaction only with difficulty breathing, tongue swelling or throat swelling.). 04/08/21   Rancour, Annie Main, MD  QUEtiapine (SEROQUEL) 25 MG tablet Take 25 mg by mouth at bedtime. 08/30/21   [provider]  SUMAtriptan (IMITREX) 50 MG tablet Take 1 tablet (50 mg total) by mouth every 2 (two) hours as needed for migraine. May repeat in 2 hours if headache persists or recurs. 09/11/21    Alric Ran, MD  tamsulosin (FLOMAX) 0.4 MG CAPS capsule Take 0.4 mg by mouth daily. 08/31/21   [provider]  warfarin (COUMADIN) 5 MG tablet Take 1 tablet (5 mg total) by mouth daily. 10/18/21   Triplett, Tammy, PA-C      Allergies    Lovenox [enoxaparin sodium], Bee venom, Penicillins, Zofran [ondansetron], Other, and Sulfa antibiotics    Review of Systems   Review of Systems  HENT:  Negative for trouble swallowing.   Skin:  Positive for itching and rash.  All other systems reviewed and are negative.  Physical Exam Updated Vital Signs Ht 5\' 11"  (1.803 m)    Wt 99.8 kg    BMI 30.68 kg/m  Physical Exam Vitals and nursing note reviewed.  Constitutional:      General: He is not in acute distress.    Appearance: He is well-developed. He is not diaphoretic.  HENT:     Head: Normocephalic and atraumatic.  Cardiovascular:     Rate and Rhythm: Normal rate and regular rhythm.     Heart sounds: No murmur heard.   No friction rub.  Pulmonary:     Effort: Pulmonary effort is normal. No respiratory distress.     Breath sounds: Normal breath sounds. No wheezing or rales.  Abdominal:     General: Bowel sounds are normal. There is no distension.     Palpations: Abdomen is soft.     Tenderness: There is no abdominal tenderness.  Musculoskeletal:  General: Normal range of motion.     Cervical back: Normal range of motion and neck supple.  Skin:    General: Skin is warm and dry.     Findings: Rash present.     Comments: There is a generalized urticarial rash throughout the torso and extremities, most notable in the bilateral groins.  Neurological:     Mental Status: He is alert and oriented to person, place, and time.     Coordination: Coordination normal.    ED Results / Procedures / Treatments   Labs (all labs ordered are listed, but only abnormal results are displayed) Labs Reviewed - No data to display  EKG None  Radiology No results  found.  Procedures Procedures    Medications Ordered in ED Medications  famotidine (PEPCID) IVPB 20 mg premix (20 mg Intravenous New Bag/Given 10/21/21 0051)  EPINEPHrine (EPI-PEN) injection 0.3 mg (0.3 mg Intramuscular Given 10/21/21 0052)  diphenhydrAMINE (BENADRYL) injection 25 mg (25 mg Intravenous Given 10/21/21 0052)  methylPREDNISolone sodium succinate (SOLU-MEDROL) 125 mg/2 mL injection 125 mg (125 mg Intravenous Given 10/21/21 0052)    ED Course/ Medical Decision Making/ A&P  This patient presents to the ED for concern of hives, this involves an extensive number of treatment options, and is a complaint that carries with it a high risk of complications and morbidity.  The differential diagnosis includes allergic reaction   Co morbidities that complicate the patient evaluation  DVT   Additional history obtained:  Additional history obtained from patient External records from outside source obtained and reviewed including N/A   Lab Tests:  I Ordered, and personally interpreted labs.  The pertinent results include:  N/A   Imaging Studies ordered:  I ordered imaging studies including N/A  I independently visualized and interpreted imaging which showed N/A I agree with the radiologist interpretation   Cardiac Monitoring:  The patient was maintained on a cardiac monitor.  I personally viewed and interpreted the cardiac monitored which showed an underlying rhythm of: no cardiac monitoring   Medicines ordered and prescription drug management:  I ordered medication including eliquis  for DVT  Reevaluation of the patient after these medicines showed that the patient stayed the same I have reviewed the patients home medicines and have made adjustments as needed   Test Considered:  none   Critical Interventions:  Blood thinner along with epinephrine, Solu-Medrol, Benadryl, and Pepcid   Consultations Obtained:  None   Problem List / ED Course:  Patient  presenting here with complaints of rash and having an apparent episode of hives/allergic reaction, presumably related to Lovenox as this is his only new medication.  He was given antihistamines, steroids, and epinephrine and his rash is markedly improved.  There is no sign of anaphylaxis as patient is not having any hypotension or airway involvement. Patient is very reluctant to take the Coumadin prescribed for him 2 days ago.  He apparently read something and the side effect profile about the possibility of "bleeding from the eyes"..  In discussion with the pharmacist, we have decided to begin Eliquis.  Patient to be discharged with Eliquis as well as continued prednisone and Benadryl.   Reevaluation:  After the interventions noted above, I reevaluated the patient and found that they have :improved   Social Determinants of Health:  Patient may or may not be compliant with his medications   Dispostion:  After consideration of the diagnostic results and the patients response to treatment, I feel that the patent would  benefit from discharge to home.  He has a follow-up appointment with his doctor on Tuesday.  He will be started on Eliquis as mentioned above as well as prednisone and Benadryl..    Final Clinical Impression(s) / ED Diagnoses Final diagnoses:  None    Rx / DC Orders ED Discharge Orders     None         Veryl Speak, MD 10/21/21 [phone]

## 2021-10-21 NOTE — Discharge Instructions (Signed)
Stop taking Lovenox and Coumadin.  Begin taking Eliquis and prednisone as prescribed.  Begin taking Benadryl 25 mg 3 times daily for the next 5 days.  This medication is available over-the-counter.  Follow-up with your doctor on Tuesday as scheduled.

## 2021-10-22 NOTE — Progress Notes (Signed)
[facility] [place]  ASSESSMENT / PLAN: 51 y.o. male with acute on chronic deep venous thrombosis. Patient needs anticoagulation for at least three months. Thrombectomy is not indicated given the chronicity of his femoral vein thrombus.  He may benefit from hematology evaluation given his failure of Xarelto, and possible allergic reaction to Lovenox.  Follow up with me as needed.   CHIEF COMPLAINT: Acute on chronic deep venous thrombosis  HISTORY OF PRESENT ILLNESS: Lance Cohen is a 51 y.o. male this to clinic after ER evaluation diagnosed acute on chronic right lower extremity deep venous thrombosis.  The patient has multiple causes for chronic lower extremity pain including spinal stenosis.  He denied history of anticoagulation intolerance.  He was previously on Xarelto, which he ports he was taking as directed prior to the diagnosis of new acute deep venous thrombosis in the right lower extremity.  He was then transition to Lovenox, which caused an intense rash across the entire body.  He was given subcutaneous epinephrine the ER for this.  He was then transition to Eliquis.  He has not started taking his Eliquis yet.  He is in significant pain, and seems mostly interested in getting some kind of pain medicine.  Past Medical History:  Diagnosis Date   Anxiety    Arthritis    Asthma    Bipolar 1 disorder ([place])    Chronic knee pain    Depression    Dilated aortic root (HCC)    DVT (deep venous thrombosis) ([place])    Hypertension    Insomnia    Sinus bradycardia    Sleep apnea    Spinal stenosis    Suicide attempt ([place])    2010 Intentional overdose attempt with Depakote.    Past Surgical History:  Procedure Laterality Date   COLONOSCOPY WITH PROPOFOL N/A 03/18/2019   Procedure: COLONOSCOPY WITH PROPOFOL;  Surgeon: [name], MD;  Location: [facility];  Service: Endoscopy;  Laterality: N/A;  2:30pm   FOOT SURGERY     had peice of wire removed   IVC  FILTER REMOVAL N/A 02/24/2017   Procedure: IVC Filter Removal;  Surgeon: [name], MD;  Location: [facility];  Service: Cardiovascular;  Laterality: N/A;   ORIF RADIAL FRACTURE Right 12/05/2020   Procedure: OPEN REDUCTION INTERNAL FIXATION (ORIF) RADIAL FRACTURE and ULNA FRACTURE;  Surgeon: Carole Civil, MD;  Location: [facility];  Service: Orthopedics;  Laterality: Right;   PERIPHERAL VASCULAR CATHETERIZATION Right 10/22/2016   Procedure: Thrombectomy, thrombolyisis;  Surgeon: [name], MD;  Location: [facility];  Service: Cardiovascular;  Laterality: Right;   PERIPHERAL VASCULAR CATHETERIZATION N/A 10/22/2016   Procedure: IVC Filter Insertion;  Surgeon: [name], MD;  Location: [facility];  Service: Cardiovascular;  Laterality: N/A;   POLYPECTOMY  03/18/2019   Procedure: POLYPECTOMY;  Surgeon: [name], MD;  Location: [facility];  Service: Endoscopy;;    Family History  Problem Relation Age of Onset   Cancer Mother    Heart attack Father    Colon cancer Neg Hx     Social History   Socioeconomic History   Marital status: Married    Spouse name: Not on file   Number of children: Not on file   Years of education: Not on file   Highest education level: Not on file  Occupational History   Not on file  Tobacco Use   Smoking status: Every Day  Packs/day: 0.50    Types: Cigarettes   Smokeless tobacco: Never  Vaping Use   Vaping Use: Never used  Substance and Sexual Activity   Alcohol use: Yes    Comment: seldom   Drug use: Yes    Types: Marijuana    Comment: smokes for pain   Sexual activity: Yes  Other Topics Concern   Not on file  Social History Narrative   Not on file   Social Determinants of Health   Financial Resource Strain: Not on file  Food Insecurity: Not on file  Transportation Needs: Not on file  Physical Activity: Not on file  Stress: Not on file  Social Connections: Not on file  Intimate Partner  Violence: Not on file    Allergies  Allergen Reactions   Lovenox [Enoxaparin Sodium] Hives   Bee Venom Swelling   Penicillins Other (See Comments)    PT states that he had symptoms that felt like he was having a heart attack Has patient had a PCN reaction causing immediate rash, facial/tongue/throat swelling, SOB or lightheadedness with hypotension: No Has patient had a PCN reaction causing severe rash involving mucus membranes or skin necrosis: No Has patient had a PCN reaction that required hospitalization No Has patient had a PCN reaction occurring within the last 10 years: No If all of the above answers are "NO", then may proceed with Cephal   Zofran [Ondansetron] Hives   Other Palpitations    Steroiod   Sulfa Antibiotics Itching    Current Outpatient Medications  Medication Sig Dispense Refill   albuterol (VENTOLIN HFA) 108 (90 Base) MCG/ACT inhaler Inhale 1-2 puffs into the lungs every 6 (six) hours as needed for wheezing or shortness of breath. 6.7 g 0   allopurinol (ZYLOPRIM) 100 MG tablet Take 100 mg by mouth daily.     EPINEPHrine 0.3 mg/0.3 mL IJ SOAJ injection Inject 0.3 mg into the muscle as needed for anaphylaxis (For severe allergic reaction only with difficulty breathing, tongue swelling or throat swelling.). 1 each 0   predniSONE (DELTASONE) 10 MG tablet Take 2 tablets (20 mg total) by mouth 2 (two) times daily. 20 tablet 0   QUEtiapine (SEROQUEL) 25 MG tablet Take 25 mg by mouth at bedtime.     SUMAtriptan (IMITREX) 50 MG tablet Take 1 tablet (50 mg total) by mouth every 2 (two) hours as needed for migraine. May repeat in 2 hours if headache persists or recurs. 10 tablet 0   tamsulosin (FLOMAX) 0.4 MG CAPS capsule Take 0.4 mg by mouth daily.     APIXABAN (ELIQUIS) VTE STARTER PACK (10MG  AND 5MG ) Take as directed on package: start with two-5mg  tablets twice daily for 7 days. On day 8, switch to one-5mg  tablet twice daily. (Patient not taking: Reported on 10/23/2021) 1 each  0   warfarin (COUMADIN) 5 MG tablet Take 1 tablet (5 mg total) by mouth daily. (Patient not taking: Reported on 10/23/2021) 30 tablet 0   No current facility-administered medications for this visit.    REVIEW OF SYSTEMS:  [X]  denotes positive finding, [ ]  denotes negative finding Cardiac  Comments:  Chest pain or chest pressure:    Shortness of breath upon exertion:    Short of breath when lying flat:    Irregular heart rhythm:        Vascular    Pain in calf, thigh, or hip brought on by ambulation:    Pain in feet at night that wakes you up from your sleep:  Blood clot in your veins:    Leg swelling:         Pulmonary    Oxygen at home:    Productive cough:     Wheezing:         Neurologic    Sudden weakness in arms or legs:     Sudden numbness in arms or legs:     Sudden onset of difficulty speaking or slurred speech:    Temporary loss of vision in one eye:     Problems with dizziness:         Gastrointestinal    Blood in stool:     Vomited blood:         Genitourinary    Burning when urinating:     Blood in urine:        Psychiatric    Major depression:         Hematologic    Bleeding problems:    Problems with blood clotting too easily:        Skin    Rashes or ulcers:        Constitutional    Fever or chills:      PHYSICAL EXAM Vitals:   10/23/21 1407  BP: 112/79  Pulse: 72  Resp: 20  Temp: 98.2 F (36.8 C)  SpO2: 97%  Weight: 220 lb (99.8 kg)  Height: 5\' 11"  (1.803 m)    Constitutional: Chronicall ill appearing older than stated age. no distress. Appears well nourished.  Neurologic: CN intact. no focal findings. no sensory loss. Psychiatric:  Mood and affect symmetric and appropriate. Eyes:  No icterus. No conjunctival pallor. Ears, nose, throat:  mucous membranes moist. Midline trachea.  Cardiac:  rate and rhythm.  Respiratory:  unlabored. Abdominal:  soft, non-tender, non-distended.  Peripheral vascular: did not remove his pants or  shoes for exam. Extremity: see above.   Skin: see above . Lymphatic: see above.  PERTINENT LABORATORY AND RADIOLOGIC DATA  Most recent CBC CBC Latest Ref Rng & Units 10/18/2021 08/11/2021 07/08/2021  WBC 4.0 - 10.5 K/uL 9.4 8.7 9.9  Hemoglobin 13.0 - 17.0 g/dL 15.0 15.5 16.6  Hematocrit 39.0 - 52.0 % 43.7 45.4 48.6  Platelets 150 - 400 K/uL 281 262 296     Most recent CMP CMP Latest Ref Rng & Units 10/18/2021 08/11/2021 07/08/2021  Glucose 70 - 99 mg/dL 93 98 110(H)  BUN 6 - 20 mg/dL 18 15 13   Creatinine 0.61 - 1.24 mg/dL 1.24 1.30(H) 1.25(H)  Sodium 135 - 145 mmol/L 137 140 136  Potassium 3.5 - 5.1 mmol/L 4.2 4.1 4.0  Chloride 98 - 111 mmol/L 102 106 99  CO2 22 - 32 mmol/L 26 28 30   Calcium 8.9 - 10.3 mg/dL 8.8(L) 8.8(L) 9.0  Total Protein 6.5 - 8.1 g/dL - 7.1 8.0  Total Bilirubin 0.3 - 1.2 mg/dL - 1.0 0.7  Alkaline Phos 38 - 126 U/L - 87 80  AST 15 - 41 U/L - 19 30  ALT 0 - 44 U/L - 13 26    Renal function Estimated Creatinine Clearance: 86.7 mL/min (by C-G formula based on SCr of 1.24 mg/dL).  Hgb A1c MFr Bld (%)  Date Value  10/19/2017 4.7 (L)    LDL Cholesterol  Date Value Ref Range Status  10/19/2017 68 0 - 99 mg/dL Final    Comment:           Total Cholesterol/HDL:CHD Risk Coronary Heart Disease Risk Table  Men   Women  1/2 Average Risk   3.4   3.3  Average Risk       5.0   4.4  2 X Average Risk   9.6   7.1  3 X Average Risk  23.4   11.0        Use the calculated Patient Ratio above and the CHD Risk Table to determine the patient's CHD Risk.        ATP III CLASSIFICATION (LDL):  <100     mg/dL   Optimal  100-129  mg/dL   Near or Above                    Optimal  130-159  mg/dL   Borderline  160-189  mg/dL   High  >190     mg/dL   Very High      Vascular Imaging:  Recent venous duplex 1. Acute DVT of the right profunda femoris and proximal femoral vein. 2. 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 3. Acute thrombosis of  the right posterior tibial and peroneal veins. 4. No left lower extremity DVT.   Yevonne Aline. Stanford Breed, MD [facility] Phone Number: [phone] 10/23/2021 4:34 PM  Total time spent on preparing this encounter including chart review, data review, collecting history, examining the patient, coordinating care for this new patient, 45 minutes.  Portions of this report may have been transcribed using voice recognition software.  Every effort has been made to ensure accuracy; however, inadvertent computerized transcription errors may still be present.

## 2021-10-23 ENCOUNTER — Ambulatory Visit (HOSPITAL_COMMUNITY): Payer: Medicaid Other

## 2021-10-23 ENCOUNTER — Other Ambulatory Visit: Payer: Self-pay

## 2021-10-23 ENCOUNTER — Ambulatory Visit: Payer: Medicaid Other | Admitting: Vascular Surgery

## 2021-10-23 ENCOUNTER — Encounter: Payer: Self-pay | Admitting: Vascular Surgery

## 2021-10-23 VITALS — BP 112/79 | HR 72 | Temp 98.2°F | Resp 20 | Ht 71.0 in | Wt 220.0 lb

## 2021-10-23 DIAGNOSIS — I82519 Chronic embolism and thrombosis of unspecified femoral vein: Secondary | ICD-10-CM

## 2021-10-23 DIAGNOSIS — I82401 Acute embolism and thrombosis of unspecified deep veins of right lower extremity: Secondary | ICD-10-CM | POA: Diagnosis not present

## 2021-11-08 ENCOUNTER — Encounter (HOSPITAL_COMMUNITY): Payer: Medicaid Other | Admitting: Hematology

## 2021-11-12 ENCOUNTER — Telehealth: Payer: Self-pay

## 2021-11-12 ENCOUNTER — Other Ambulatory Visit: Payer: Self-pay

## 2021-11-12 DIAGNOSIS — I82511 Chronic embolism and thrombosis of right femoral vein: Secondary | ICD-10-CM

## 2021-11-12 DIAGNOSIS — I82401 Acute embolism and thrombosis of unspecified deep veins of right lower extremity: Secondary | ICD-10-CM

## 2021-11-12 NOTE — Telephone Encounter (Signed)
Pt called to inquire about hematology referral. Referral had been sent to [facility], which had previously d/c pt from their practice. Referral has been sent to [facility] location.

## 2021-11-20 ENCOUNTER — Telehealth: Payer: Self-pay | Admitting: Hematology and Oncology

## 2021-11-20 ENCOUNTER — Telehealth: Payer: Self-pay | Admitting: Hematology

## 2021-11-20 NOTE — Telephone Encounter (Signed)
Scheduled appt per 1/23 referral. Pt is aware of appt date and time. Pt is aware to arrive 15 mins early.

## 2021-12-06 ENCOUNTER — Inpatient Hospital Stay: Payer: Medicaid Other

## 2021-12-06 ENCOUNTER — Other Ambulatory Visit: Payer: Self-pay

## 2021-12-06 ENCOUNTER — Inpatient Hospital Stay: Payer: Medicaid Other | Attending: Hematology and Oncology | Admitting: Hematology and Oncology

## 2021-12-06 VITALS — BP 121/67 | HR 62 | Temp 97.9°F | Resp 18 | Wt 221.6 lb

## 2021-12-06 DIAGNOSIS — I82411 Acute embolism and thrombosis of right femoral vein: Secondary | ICD-10-CM

## 2021-12-06 DIAGNOSIS — Z7901 Long term (current) use of anticoagulants: Secondary | ICD-10-CM | POA: Diagnosis not present

## 2021-12-06 DIAGNOSIS — I82511 Chronic embolism and thrombosis of right femoral vein: Secondary | ICD-10-CM | POA: Diagnosis not present

## 2021-12-06 LAB — CBC WITH DIFFERENTIAL (CANCER CENTER ONLY)
Abs Immature Granulocytes: 0.02 10*3/uL (ref 0.00–0.07)
Basophils Absolute: 0.1 10*3/uL (ref 0.0–0.1)
Basophils Relative: 1 %
Eosinophils Absolute: 0.2 10*3/uL (ref 0.0–0.5)
Eosinophils Relative: 3 %
HCT: 46 % (ref 39.0–52.0)
Hemoglobin: 15.9 g/dL (ref 13.0–17.0)
Immature Granulocytes: 0 %
Lymphocytes Relative: 39 %
Lymphs Abs: 3.1 10*3/uL (ref 0.7–4.0)
MCH: 33.3 pg (ref 26.0–34.0)
MCHC: 34.6 g/dL (ref 30.0–36.0)
MCV: 96.4 fL (ref 80.0–100.0)
Monocytes Absolute: 0.7 10*3/uL (ref 0.1–1.0)
Monocytes Relative: 9 %
Neutro Abs: 3.9 10*3/uL (ref 1.7–7.7)
Neutrophils Relative %: 48 %
Platelet Count: 330 10*3/uL (ref 150–400)
RBC: 4.77 MIL/uL (ref 4.22–5.81)
RDW: 12.1 % (ref 11.5–15.5)
WBC Count: 8 10*3/uL (ref 4.0–10.5)
nRBC: 0 % (ref 0.0–0.2)

## 2021-12-06 LAB — ANTITHROMBIN III: AntiThromb III Func: 108 % (ref 75–120)

## 2021-12-06 LAB — CMP (CANCER CENTER ONLY)
ALT: 15 U/L (ref 0–44)
AST: 20 U/L (ref 15–41)
Albumin: 4.3 g/dL (ref 3.5–5.0)
Alkaline Phosphatase: 77 U/L (ref 38–126)
Anion gap: 8 (ref 5–15)
BUN: 15 mg/dL (ref 6–20)
CO2: 26 mmol/L (ref 22–32)
Calcium: 9.2 mg/dL (ref 8.9–10.3)
Chloride: 104 mmol/L (ref 98–111)
Creatinine: 1.13 mg/dL (ref 0.61–1.24)
GFR, Estimated: >60 mL/min (ref >60)
Glucose, Bld: 93 mg/dL (ref 70–99)
Potassium: 4.1 mmol/L (ref 3.5–5.1)
Sodium: 138 mmol/L (ref 135–145)
Total Bilirubin: 0.3 mg/dL (ref 0.3–1.2)
Total Protein: 7.8 g/dL (ref 6.5–8.1)

## 2021-12-06 NOTE — Progress Notes (Signed)
[place] [phone]   Fax:([phone]) [place] NOTE  Patient Care Team: Nsumanganyi, [name], NP as PCP - General (Adult Health Nurse Practitioner) Arnoldo Lenis, MD as PCP - Cardiology (Cardiology) [name], MD as Consulting Physician (Gastroenterology)  Hematological/Oncological History # Right Lower Extremity DVT 10/10/2018: [place] LE showed deep venous thrombosis of the right lower extremity primarily at the level of the femoral vein in the thigh with nearly occlusive thrombus present. There is nonocclusive thrombus extending into the profunda femoral vein. Started on Xarelto therapy 10/18/2021: [place] LE was performed due to bilateral groin pain. Results showed an acute DVT of the right profunda femoris and proximal femoral vein. Additionally noted to have chronic DVT of mid right femoral vein and acute thrombosis of right posterior tibial and peroneal veins. No Left lower extremity DVT noted.  Started on lovenox therapy with plans to transition to coumadin.  10/21/2021: presented to the ED with rash, findings thought to be 2/2 to Lovenox therapy. Switched to eliquis.  12/06/2021: Establish care with Dr. Lorenso Courier.  CHIEF COMPLAINTS/PURPOSE OF CONSULTATION:  "Right Lower Extremity DVT "  HISTORY OF PRESENTING ILLNESS:  Lance Cohen 51 y.o. male with medical history significant for anxiety, asthma, bipolar disorder, chronic knee pain, hypertension, sleep apnea, and spinal stenosis who presents for evaluation of right lower extremity DVT.  On review of the previous records Lance Cohen was initially diagnosed with a DVT on 10/10/2018.  At the time ultrasound showed a deep vein thrombosis of the right lower extremity primarily at the level of femoral vein in the thigh with near occlusive thrombus present.  There was a nonocclusive thrombus extending into the profunda vein.  He was started on Xarelto therapy at that time.  On 10/19/2019 the  patient presented the emergency department with bilateral groin pain and ultrasound were repeated.  There were new DVTs in the right profunda femoris and proximal right femoral vein.  Additionally he was found to have chronic DVT of the right mid femoral vein and acute thrombosis of the right posterior tibial and peroneal veins.  There is no DVT in the left lower extremity.  He was started on Lovenox therapy with plan to transition to Coumadin but developed a rash which was thought to be secondary to Lovenox therapy.  Instead he was started on Eliquis.  Due to concern for his recurrent right lower extremity DVT that appeared refractory to Xarelto therapy he was referred to hematology for further evaluation and management.  On exam today Lance Cohen reports he developed his first blood clot in 2019 when he was sitting in a "cold ass room".  He had not had any recent surgeries, illnesses, or prolonged immobility.  He notes that he was started on Xarelto at that time but unfortunate developed 2 new blood clots recently in December 2022.  He was started on Lovenox therapy but developed a "allergic reaction".  He notes that he broke out in a terrible rash that required an epinephrine shot.  He notes that the itching dissipated but the rash took longer to resolve.  He reports that he has numerous other medication allergies.  He was transitioned onto Eliquis therapy which she reports he is tolerated well.  He is had no issues with bleeding, bruising, or dark stools.  He reports that his leg is "not feeling much better".  He notes that it hurts when he goes the bathroom and he has bilateral groin pain.  On further  discussion he reports his father has a history of gout and his mother had a history of breast cancer.  He is married with no children.  He smokes approximately 1 pack/day.  He notes that 1/5 of liquor might last him "1 day to 1 week".  He notes that he is currently on disability for his bipolar disorder.  He has  a longstanding baseline shortness of breath due to his COPD.  He does not have any new chest pain or shortness of breath.  He otherwise denies any fevers, chills, sweats, nausea vomiting or diarrhea.  Full 10 point ROS is listed below.  MEDICAL HISTORY:  Past Medical History:  Diagnosis Date   Anxiety    Arthritis    Asthma    Bipolar 1 disorder ([place])    Chronic knee pain    Depression    Dilated aortic root ([facility])    DVT (deep venous thrombosis) ([place])    Hypertension    Insomnia    Sinus bradycardia    Sleep apnea    Spinal stenosis    Suicide attempt ([place])    2010 Intentional overdose attempt with Depakote.    SURGICAL HISTORY: Past Surgical History:  Procedure Laterality Date   COLONOSCOPY WITH PROPOFOL N/A 03/18/2019   Procedure: COLONOSCOPY WITH PROPOFOL;  Surgeon: [name], MD;  [facility];  Service: Endoscopy;  Laterality: N/A;  2:30pm   FOOT SURGERY     had peice of wire removed   IVC FILTER REMOVAL N/A 02/24/2017   Procedure: IVC Filter Removal;  Surgeon: [name], MD;  Location: [facility];  Service: Cardiovascular;  Laterality: N/A;   ORIF RADIAL FRACTURE Right 12/05/2020   Procedure: OPEN REDUCTION INTERNAL FIXATION (ORIF) RADIAL FRACTURE and ULNA FRACTURE;  Surgeon: Carole Civil, MD;  Location: [facility];  Service: Orthopedics;  Laterality: Right;   PERIPHERAL VASCULAR CATHETERIZATION Right 10/22/2016   Procedure: Thrombectomy, thrombolyisis;  Surgeon: [name], MD;  Location: [facility];  Service: Cardiovascular;  Laterality: Right;   PERIPHERAL VASCULAR CATHETERIZATION N/A 10/22/2016   Procedure: IVC Filter Insertion;  Surgeon: [name], MD;  Location: [facility];  Service: Cardiovascular;  Laterality: N/A;   POLYPECTOMY  03/18/2019   Procedure: POLYPECTOMY;  Surgeon: [name], MD;  [facility];  Service: Endoscopy;;    SOCIAL HISTORY: Social History   Socioeconomic History    Marital status: Married    Spouse name: Not on file   Number of children: Not on file   Years of education: Not on file   Highest education level: Not on file  Occupational History   Not on file  Tobacco Use   Smoking status: Every Day    Packs/day: 0.50    Types: Cigarettes   Smokeless tobacco: Never  Vaping Use   Vaping Use: Never used  Substance and Sexual Activity   Alcohol use: Yes    Comment: seldom   Drug use: Yes    Types: Marijuana    Comment: smokes for pain   Sexual activity: Yes  Other Topics Concern   Not on file  Social History Narrative   Not on file   Social Determinants of Health   Financial Resource Strain: Not on file  Food Insecurity: Not on file  Transportation Needs: Not on file  Physical Activity: Not on file  Stress: Not on file  Social Connections: Not on file  Intimate Partner Violence: Not on  file    FAMILY HISTORY: Family History  Problem Relation Age of Onset   Cancer Mother    Heart attack Father    Colon cancer Neg Hx     ALLERGIES:  is allergic to lovenox [enoxaparin sodium], bee venom, penicillins, zofran [ondansetron], other, and sulfa antibiotics.  MEDICATIONS:  Current Outpatient Medications  Medication Sig Dispense Refill   albuterol (VENTOLIN HFA) 108 (90 Base) MCG/ACT inhaler Inhale 1-2 puffs into the lungs every 6 (six) hours as needed for wheezing or shortness of breath. 6.7 g 0   ELIQUIS 5 MG TABS tablet Take 5 mg by mouth 2 (two) times daily.     EPINEPHrine 0.3 mg/0.3 mL IJ SOAJ injection Inject 0.3 mg into the muscle as needed for anaphylaxis (For severe allergic reaction only with difficulty breathing, tongue swelling or throat swelling.). 1 each 0   LATUDA 20 MG TABS tablet SMARTSIG:1 Tablet(s) By Mouth Every Evening     QUEtiapine (SEROQUEL) 25 MG tablet Take 25 mg by mouth at bedtime.     SUMAtriptan (IMITREX) 50 MG tablet Take 1 tablet (50 mg total) by mouth every 2 (two) hours as needed for migraine. May  repeat in 2 hours if headache persists or recurs. 10 tablet 0   SYMBICORT 80-4.5 MCG/ACT inhaler Inhale into the lungs.     Vitamin D, Ergocalciferol, (DRISDOL) 1.25 MG (50000 UNIT) CAPS capsule Take 50,000 Units by mouth once a week.     No current facility-administered medications for this visit.    REVIEW OF SYSTEMS:   Constitutional: ( - ) fevers, ( - )  chills , ( - ) night sweats Eyes: ( - ) blurriness of vision, ( - ) double vision, ( - ) watery eyes Ears, nose, mouth, throat, and face: ( - ) mucositis, ( - ) sore throat Respiratory: ( - ) cough, ( - ) dyspnea, ( - ) wheezes Cardiovascular: ( - ) palpitation, ( - ) chest discomfort, ( - ) lower extremity swelling Gastrointestinal:  ( - ) nausea, ( - ) heartburn, ( - ) change in bowel habits Skin: ( - ) abnormal skin rashes Lymphatics: ( - ) new lymphadenopathy, ( - ) easy bruising Neurological: ( - ) numbness, ( - ) tingling, ( - ) new weaknesses Behavioral/Psych: ( - ) mood change, ( - ) new changes  All other systems were reviewed with the patient and are negative.  PHYSICAL EXAMINATION:  Vitals:   12/06/21 1330  BP: 121/67  Pulse: 62  Resp: 18  Temp: 97.9 F (36.6 C)  SpO2: 98%   Filed Weights   12/06/21 1330  Weight: 221 lb 9 oz (100.5 kg)    GENERAL: well appearing middle-aged Caucasian male, in NAD  SKIN: skin color, texture, turgor are normal, no rashes or significant lesions EYES: conjunctiva are pink and non-injected, sclera clear LUNGS: clear to auscultation and percussion with normal breathing effort HEART: regular rate & rhythm and no murmurs and no lower extremity edema Musculoskeletal: no cyanosis of digits and no clubbing  PSYCH: alert & oriented x 3, fluent speech NEURO: no focal motor/sensory deficits  LABORATORY DATA:  I have reviewed the data as listed CBC Latest Ref Rng & Units 12/06/2021 10/18/2021 08/11/2021  WBC 4.0 - 10.5 K/uL 8.0 9.4 8.7  Hemoglobin 13.0 - 17.0 g/dL 15.9 15.0 15.5   Hematocrit 39.0 - 52.0 % 46.0 43.7 45.4  Platelets 150 - 400 K/uL 330 281 262    CMP Latest Ref Rng &  Units 12/06/2021 10/18/2021 08/11/2021  Glucose 70 - 99 mg/dL 93 93 98  BUN 6 - 20 mg/dL 15 18 15   Creatinine 0.61 - 1.24 mg/dL 1.13 1.24 1.30(H)  Sodium 135 - 145 mmol/L 138 137 140  Potassium 3.5 - 5.1 mmol/L 4.1 4.2 4.1  Chloride 98 - 111 mmol/L 104 102 106  CO2 22 - 32 mmol/L 26 26 28   Calcium 8.9 - 10.3 mg/dL 9.2 8.8(L) 8.8(L)  Total Protein 6.5 - 8.1 g/dL 7.8 - 7.1  Total Bilirubin 0.3 - 1.2 mg/dL 0.3 - 1.0  Alkaline Phos 38 - 126 U/L 77 - 87  AST 15 - 41 U/L 20 - 19  ALT 0 - 44 U/L 15 - 13     ASSESSMENT & PLAN Lance Cohen 51 y.o. male with medical history significant for anxiety, asthma, bipolar disorder, chronic knee pain, hypertension, sleep apnea, and spinal stenosis who presents for evaluation of right lower extremity DVT.  After review of the labs, review of the records, and discussion with the patient the patients findings are most consistent with a recurrent right lower extremity DVT.  At this time I am concerned for possible mechanical obstruction such as May-Thurner syndrome versus a hypercoagulable state.  We will begin the work-up for both of these possible conditions.  Additionally in the interim I do think it is appropriate for the patient to continue on Eliquis therapy.  The patient voices understanding of this plan moving forward.  We will plan to see the patient back in approximately 6 months or sooner if there are concerning findings in the below work-up.  # Recurrent Right Lower Extremity DVT -- Findings at this time are concerning for a recurrent DVT of the right lower extremity. --At this time unclear if this represents a hypercoagulable state versus mechanical obstruction such as May-Thurner syndrome. --We will order a CT venogram in order to assess for May Thurner syndrome --Additionally we will start a hypercoagulation work-up to include  antiphospholipid antibodies, protein C and S deficiencies, and Antithrombin III deficiency --Okay to continue Eliquis therapy at this time.  Recommend 5 mg twice daily --Return to clinic in approximately 6 months time to reevaluate or sooner if there are concerning findings in above work-up.  Orders Placed This Encounter  Procedures   CT VENOGRAM ABDOMEN PELVIS    Standing Status:   Future    Standing Expiration Date:   12/06/2022    Order Specific Question:   Preferred imaging location?    Answer:   [facility]   CBC with Differential ([facility] Only)    Standing Status:   Future    Number of Occurrences:   1    Standing Expiration Date:   12/06/2022   CMP ([facility] only)    Standing Status:   Future    Number of Occurrences:   1    Standing Expiration Date:   12/06/2022   Antithrombin III*    Standing Status:   Future    Number of Occurrences:   1    Standing Expiration Date:   12/06/2022   Beta-2-glycoprotein i abs, IgG/M/A    Standing Status:   Future    Number of Occurrences:   1    Standing Expiration Date:   12/06/2022   Cardiolipin antibodies, IgG, IgM, IgA*    Standing Status:   Future    Number of Occurrences:   1    Standing Expiration Date:   12/06/2022   Protein C,  total*    Standing Status:   Future    Number of Occurrences:   1    Standing Expiration Date:   12/06/2022   Protein S, total    Standing Status:   Future    Number of Occurrences:   1    Standing Expiration Date:   12/06/2022    All questions were answered. The patient knows to call the clinic with any problems, questions or concerns.  A total of more than 60 minutes were spent on this encounter with face-to-face time and non-face-to-face time, including preparing to see the patient, ordering tests and/or medications, counseling the patient and coordination of care as outlined above.   Ledell Peoples, MD Department of Hematology/Oncology [place] [facility] Phone: [phone] Pager: [phone] Email: [email or web address]  12/06/2021 3:58 PM

## 2021-12-07 ENCOUNTER — Telehealth: Payer: Self-pay | Admitting: Hematology and Oncology

## 2021-12-07 LAB — BETA-2-GLYCOPROTEIN I ABS, IGG/M/A
Beta-2 Glyco I IgG: <9 GPI IgG units (ref 0–20)
Beta-2-Glycoprotein I IgA: <9 GPI IgA units (ref 0–25)
Beta-2-Glycoprotein I IgM: 11 GPI IgM units (ref 0–32)

## 2021-12-07 LAB — PROTEIN S, TOTAL: Protein S Ag, Total: 71 % (ref 60–150)

## 2021-12-07 NOTE — Telephone Encounter (Signed)
Left message with follow-up appointment per 2/16 los.

## 2021-12-08 LAB — CARDIOLIPIN ANTIBODIES, IGG, IGM, IGA
Anticardiolipin IgA: <9 APL U/mL (ref 0–11)
Anticardiolipin IgG: <9 GPL U/mL (ref 0–14)
Anticardiolipin IgM: 30 MPL U/mL — ABNORMAL HIGH (ref 0–12)

## 2021-12-09 LAB — PROTEIN C, TOTAL: Protein C, Total: 85 % (ref 60–150)

## 2021-12-13 ENCOUNTER — Ambulatory Visit (HOSPITAL_COMMUNITY): Payer: Medicaid Other

## 2021-12-14 ENCOUNTER — Ambulatory Visit: Payer: Medicaid Other | Admitting: Cardiology

## 2021-12-14 ENCOUNTER — Telehealth: Payer: Self-pay | Admitting: *Deleted

## 2021-12-14 NOTE — Progress Notes (Unsigned)
Clinical Summary Mr. Hopkin is a 51 y.o.male   Past Medical History:  Diagnosis Date   Anxiety    Arthritis    Asthma    Bipolar 1 disorder ([place])    Chronic knee pain    Depression    Dilated aortic root ([place])    DVT (deep venous thrombosis) ([place])    Hypertension    Insomnia    Sinus bradycardia    Sleep apnea    Spinal stenosis    Suicide attempt ([place])    2010 Intentional overdose attempt with Depakote.     Allergies  Allergen Reactions   Lovenox [Enoxaparin Sodium] Hives   Bee Venom Swelling   Penicillins Other (See Comments)    PT states that he had symptoms that felt like he was having a heart attack Has patient had a PCN reaction causing immediate rash, facial/tongue/throat swelling, SOB or lightheadedness with hypotension: No Has patient had a PCN reaction causing severe rash involving mucus membranes or skin necrosis: No Has patient had a PCN reaction that required hospitalization No Has patient had a PCN reaction occurring within the last 10 years: No If all of the above answers are "NO", then may proceed with Cephal   Zofran [Ondansetron] Hives   Other Palpitations    Steroiod   Sulfa Antibiotics Itching     Current Outpatient Medications  Medication Sig Dispense Refill   albuterol (VENTOLIN HFA) 108 (90 Base) MCG/ACT inhaler Inhale 1-2 puffs into the lungs every 6 (six) hours as needed for wheezing or shortness of breath. 6.7 g 0   ELIQUIS 5 MG TABS tablet Take 5 mg by mouth 2 (two) times daily.     EPINEPHrine 0.3 mg/0.3 mL IJ SOAJ injection Inject 0.3 mg into the muscle as needed for anaphylaxis (For severe allergic reaction only with difficulty breathing, tongue swelling or throat swelling.). 1 each 0   LATUDA 20 MG TABS tablet SMARTSIG:1 Tablet(s) By Mouth Every Evening     QUEtiapine (SEROQUEL) 25 MG tablet Take 25 mg by mouth at bedtime.     SUMAtriptan (IMITREX) 50 MG tablet Take 1 tablet (50 mg total) by mouth every 2 (two) hours as needed for  migraine. May repeat in 2 hours if headache persists or recurs. 10 tablet 0   SYMBICORT 80-4.5 MCG/ACT inhaler Inhale into the lungs.     Vitamin D, Ergocalciferol, (DRISDOL) 1.25 MG (50000 UNIT) CAPS capsule Take 50,000 Units by mouth once a week.     No current facility-administered medications for this visit.     Past Surgical History:  Procedure Laterality Date   COLONOSCOPY WITH PROPOFOL N/A 03/18/2019   Procedure: COLONOSCOPY WITH PROPOFOL;  Surgeon: [name], MD;  [facility];  Service: Endoscopy;  Laterality: N/A;  2:30pm   FOOT SURGERY     had peice of wire removed   IVC FILTER REMOVAL N/A 02/24/2017   Procedure: IVC Filter Removal;  Surgeon: [name], MD;  Location: [facility];  Service: Cardiovascular;  Laterality: N/A;   ORIF RADIAL FRACTURE Right 12/05/2020   Procedure: OPEN REDUCTION INTERNAL FIXATION (ORIF) RADIAL FRACTURE and ULNA FRACTURE;  Surgeon: Carole Civil, MD;  Location[facility];  Service: Orthopedics;  Laterality: Right;   PERIPHERAL VASCULAR CATHETERIZATION Right 10/22/2016   Procedure: Thrombectomy, thrombolyisis;  Surgeon: [name], MD;  Location: [facility];  Service: Cardiovascular;  Laterality: Right;   PERIPHERAL VASCULAR CATHETERIZATION N/A 10/22/2016   Procedure:  IVC Filter Insertion;  Surgeon: [name], MD;  Location: [facility];  Service: Cardiovascular;  Laterality: N/A;   POLYPECTOMY  03/18/2019   Procedure: POLYPECTOMY;  Surgeon: [name], MD;  [facility];  Service: Endoscopy;;     Allergies  Allergen Reactions   Lovenox [Enoxaparin Sodium] Hives   Bee Venom Swelling   Penicillins Other (See Comments)    PT states that he had symptoms that felt like he was having a heart attack Has patient had a PCN reaction causing immediate rash, facial/tongue/throat swelling, SOB or lightheadedness with hypotension: No Has patient had a PCN reaction causing severe rash  involving mucus membranes or skin necrosis: No Has patient had a PCN reaction that required hospitalization No Has patient had a PCN reaction occurring within the last 10 years: No If all of the above answers are "NO", then may proceed with Cephal   Zofran [Ondansetron] Hives   Other Palpitations    Steroiod   Sulfa Antibiotics Itching      Family History  Problem Relation Age of Onset   Cancer Mother    Heart attack Father    Colon cancer Neg Hx      Social History Mr. Neidert reports that he has been smoking cigarettes. He has been smoking an average of .5 packs per day. He has never used smokeless tobacco. Mr. Belsito reports current alcohol use.   Review of Systems CONSTITUTIONAL: No weight loss, fever, chills, weakness or fatigue.  HEENT: Eyes: No visual loss, blurred vision, double vision or yellow sclerae.No hearing loss, sneezing, congestion, runny nose or sore throat.  SKIN: No rash or itching.  CARDIOVASCULAR:  RESPIRATORY: No shortness of breath, cough or sputum.  GASTROINTESTINAL: No anorexia, nausea, vomiting or diarrhea. No abdominal pain or blood.  GENITOURINARY: No burning on urination, no polyuria NEUROLOGICAL: No headache, dizziness, syncope, paralysis, ataxia, numbness or tingling in the extremities. No change in bowel or bladder control.  MUSCULOSKELETAL: No muscle, back pain, joint pain or stiffness.  LYMPHATICS: No enlarged nodes. No history of splenectomy.  PSYCHIATRIC: No history of depression or anxiety.  ENDOCRINOLOGIC: No reports of sweating, cold or heat intolerance. No polyuria or polydipsia.  Marland Kitchen   Physical Examination There were no vitals filed for this visit. There were no vitals filed for this visit.  Gen: resting comfortably, no acute distress HEENT: no scleral icterus, pupils equal round and reactive, no palptable cervical adenopathy,  CV Resp: Clear to auscultation bilaterally GI: abdomen is soft, non-tender, non-distended, normal bowel  sounds, no hepatosplenomegaly MSK: extremities are warm, no edema.  Skin: warm, no rash Neuro:  no focal deficits Psych: appropriate affect   Diagnostic Studies     Assessment and Plan        Arnoldo Lenis, M.D., F.A.C.C.

## 2021-12-14 NOTE — Telephone Encounter (Signed)
TCT patient regarding recent lab results. His currently listed phone is not in service. Call made to his wife. Able to speak to patient. Advised that he does not have a clotting disorder. His other lab work was normal.  He is to still go for the CT veno gram on 12/24/21 We will see him back in clinic in August 2023

## 2021-12-14 NOTE — Telephone Encounter (Signed)
-----   Message from Orson Slick, MD sent at 12/14/2021  8:26 AM EST ----- Please let Lance Cohen know that we found no evidence of a clotting disorder.  The rest of his blood work was normal.  We will plan to see him back in approximately 6 months time. ----- Message ----- From: Buel Ream, Lab In [place] Sent: 12/06/2021   2:49 PM EST To: Orson Slick, MD

## 2021-12-24 ENCOUNTER — Ambulatory Visit (HOSPITAL_COMMUNITY): Payer: Medicaid Other

## 2021-12-28 ENCOUNTER — Ambulatory Visit (HOSPITAL_COMMUNITY)
Admission: RE | Admit: 2021-12-28 | Discharge: 2021-12-28 | Disposition: A | Discharge status: Home / Self Care | Payer: Medicaid Other | Source: Ambulatory Visit | Attending: Hematology and Oncology | Admitting: Hematology and Oncology

## 2021-12-28 ENCOUNTER — Other Ambulatory Visit: Payer: Self-pay

## 2021-12-28 ENCOUNTER — Telehealth: Payer: Self-pay | Admitting: Cardiology

## 2021-12-28 DIAGNOSIS — I82411 Acute embolism and thrombosis of right femoral vein: Secondary | ICD-10-CM | POA: Insufficient documentation

## 2021-12-28 MED ORDER — IOHEXOL 300 MG/ML  SOLN
100.0000 mL | Freq: Once | INTRAMUSCULAR | Status: AC | PRN
  Administered 2021-12-28: 100 mL via INTRAVENOUS

## 2021-12-28 MED ORDER — SODIUM CHLORIDE (PF) 0.9 % IJ SOLN
INTRAMUSCULAR | Status: AC
  Filled 2021-12-28: qty 50

## 2021-12-28 NOTE — Telephone Encounter (Signed)
Spoke to pt who questioned why his hr dipped into the 30's during his hospitalization on 10/21/21. Verbalized to pt that he had a hx of Bradycardia per D. Dunn, PA-C: ? ?He wore 2 back to back monitors - the first was a 7 day monitor 9/8-9/15/22 with predominantly NSR average 70bpm, min HR 37, max HR 148, 1 run SVT lasting 11 beats, rare ectopy, triggers associated with NSR only. Second monitor 9/15-9/29 showed predominantly NSR average 76bpm, min HR 41, max HR 160, 1 run SVT lasting 5 beats, rare ectopy. There do not appear to be any arrhythmia events correlating with his LOC episode on 9/14. His bradycardia seemed to be primarily in the early AM hours otherwise largely NSR. ? ?There was no mention of irregular heart rate from hospital stay in pt's chart. Pt verbalized understanding and stated that is not having any problems other than in his groin area where he has clots. Pt had no other questions.  ? ?I will fwd to provider as FYI ?

## 2021-12-28 NOTE — Telephone Encounter (Signed)
Patient wanted to know why during his recent hospital stay his HR dipped into the 30s.  ? ?Patient is not having any symptoms now  ?

## 2022-01-02 ENCOUNTER — Telehealth: Payer: Self-pay | Admitting: *Deleted

## 2022-01-02 NOTE — Telephone Encounter (Signed)
-----   Message from Lincoln Brigham, PA-C sent at 12/31/2021  2:11 PM EDT ----- ?No evidence of May Therners ?----- Message ----- ?From: Interface, Rad Results In ?Sent: 12/29/2021   7:58 AM EDT ?To: Orson Slick, MD ? ? ?

## 2022-01-02 NOTE — Telephone Encounter (Signed)
Attempted call to pt His # is not currently in service. TCT patient's wife, Lance Cohen and was able to speak with patient. Advised that his venogram was normal. Pt asked why his lower abdomen was so painful when he peed and "went #2". Advised that he should contact his GI doctor. Provided phone to Dr. Roseanne Kaufman office. Pt appreciated the information. ?

## 2022-01-08 NOTE — Progress Notes (Deleted)
? ? ?Referring Provider: Ginny Forth* ?Primary Care Physician:  [name], NP ?Primary GI Physician: Dr. Gala Romney ? ?No chief complaint on file. ? ? ?HPI:   ?Lance Cohen is a 51 y.o. male presenting today with chief complaint of *** ? ?GI history significant for GERD, rectal bleeding in the setting of known hemorrhoids, adenomatous colon polyps due for surveillance colonoscopy in 2027. ? 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  Started on Eliquis initially, transitioned to [place] in December 2019 (unclear to me if he was taking Eliquis up to the transition to [place]) . 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  However, he presented to the ED with a rash which was thought to be secondary to Lovenox and he was switched to Eliquis.  He is now following with hematology who has started a hypercoagulable work-up and also ordered CT venogram which ruled out May Turner syndrome, no evidence of proximal LE venous filling defect. ? ?He was last seen in our office 06/29/2019.  He had undergone a colonoscopy due to history of hematochezia, lower abdominal pain (thought to be secondary to blood clot in his groin).  Colonoscopy was completed in May 2020 and found 6 mm tubular adenoma, diverticulosis in sigmoid and descending colon, nonbleeding internal hemorrhoids which was suspected to be the cause of trivial rectal bleeding.  Recommended repeat colonoscopy in 7 years.  At the time of his office visit in September, noted mild lower abdominal discomfort, but known blood clot.  Was having nausea/vomiting with certain triggers, but vomiting had decreased with avoiding  red meat.  Rectal bleeding improved.  Noted regular heartburn/reflux only taking Tums or Rolaids.  Recommended starting Prilosec 40 mg daily, follow-up in 3 months.  ? ?Today:  ? ? ?CT venogram  ? ?Past Medical History:  ?Diagnosis Date  ? Anxiety   ? Arthritis   ? Asthma   ? Bipolar 1 disorder ([place])   ? Chronic knee pain   ? Depression   ? Dilated aortic root ([place])   ? DVT (deep venous thrombosis) ([place])   ? Hypertension   ? Insomnia   ? Sinus bradycardia   ? Sleep apnea   ? Spinal stenosis   ? Suicide attempt [facility])   ? 2010 Intentional overdose attempt with Depakote.  ? ? ?Past Surgical History:  ?Procedure Laterality Date  ? COLONOSCOPY WITH PROPOFOL N/A 03/18/2019  ? Procedure: COLONOSCOPY WITH PROPOFOL;  Surgeon: [name], MD;  [facility];  Service: Endoscopy;  Laterality: N/A;  2:30pm  ? FOOT SURGERY    ? had peice of wire removed  ? IVC FILTER REMOVAL N/A 02/24/2017  ? Procedure: IVC Filter Removal;  Surgeon: [name], MD;  Location: [place] [facility];  Service: Cardiovascular;  Laterality: N/A;  ? ORIF RADIAL FRACTURE Right 12/05/2020  ? Procedure: OPEN REDUCTION INTERNAL FIXATION (ORIF) RADIAL FRACTURE and ULNA FRACTURE;  Surgeon: Carole Civil, MD;  Location: [facility];  Service: Orthopedics;  Laterality: Right;  ? PERIPHERAL VASCULAR CATHETERIZATION Right 10/22/2016  ? Procedure: Thrombectomy, thrombolyisis;  Surgeon: [name], MD;  Location: [place] [facility];  Service: Cardiovascular;  Laterality:  Right;  ? PERIPHERAL VASCULAR CATHETERIZATION N/A 10/22/2016  ? Procedure: IVC Filter Insertion;  Surgeon: [name], MD;  Location: [place] [facility];  Service: Cardiovascular;  Laterality: N/A;  ? POLYPECTOMY  03/18/2019  ? Procedure: POLYPECTOMY;  Surgeon: [name], MD;  [facility];  Service: Endoscopy;;  ? ? ?Current Outpatient Medications  ?Medication Sig Dispense Refill  ? albuterol (VENTOLIN HFA) 108 (90 Base) MCG/ACT inhaler Inhale 1-2  puffs into the lungs every 6 (six) hours as needed for wheezing or shortness of breath. 6.7 g 0  ? ELIQUIS 5 MG TABS tablet Take 5 mg by mouth 2 (two) times daily.    ? EPINEPHrine 0.3 mg/0.3 mL IJ SOAJ injection Inject 0.3 mg into the muscle as needed for anaphylaxis (For severe allergic reaction only with difficulty breathing, tongue swelling or throat swelling.). 1 each 0  ? LATUDA 20 MG TABS tablet SMARTSIG:1 Tablet(s) By Mouth Every Evening    ? QUEtiapine (SEROQUEL) 25 MG tablet Take 25 mg by mouth at bedtime.    ? SUMAtriptan (IMITREX) 50 MG tablet Take 1 tablet (50 mg total) by mouth every 2 (two) hours as needed for migraine. May repeat in 2 hours if headache persists or recurs. 10 tablet 0  ? SYMBICORT 80-4.5 MCG/ACT inhaler Inhale into the lungs.    ? Vitamin D, Ergocalciferol, (DRISDOL) 1.25 MG (50000 UNIT) CAPS capsule Take 50,000 Units by mouth once a week.    ? ?No current facility-administered medications for this visit.  ? ? ?Allergies as of 01/10/2022 - Review Complete 12/06/2021  ?Allergen Reaction Noted  ? Lovenox [enoxaparin sodium] Hives 10/21/2021  ? Bee venom Swelling 12/08/2013  ? Penicillins Other (See Comments)   ? Zofran [ondansetron] Hives 04/09/2021  ? Other Palpitations 06/11/2021  ? Sulfa antibiotics Itching 11/13/2013  ? ? ?Family History  ?Problem Relation Age of Onset  ? Cancer Mother   ? Heart attack Father   ? Colon cancer Neg Hx   ? ? ?Social History  ? ?Socioeconomic History  ? Marital status: Married  ?  Spouse name: Not on file  ? Number of children: Not on file  ? Years of education: Not on file  ? Highest education level: Not on file  ?Occupational History  ? Not on file  ?Tobacco Use  ? Smoking status: Every Day  ?  Packs/day: 0.50  ?  Types: Cigarettes  ? Smokeless tobacco: Never  ?Vaping Use  ? Vaping Use: Never used  ?Substance and Sexual Activity  ? Alcohol use: Yes  ?  Comment: seldom  ? Drug use: Yes  ?  Types: Marijuana  ?  Comment: smokes for pain  ? Sexual  activity: Yes  ?Other Topics Concern  ? Not on file  ?Social History Narrative  ? Not on file  ? ?Social Determinants of Health  ? ?Financial Resource Strain: Not on file  ?Food Insecurity: Not on file  ?Transportation Needs: Not on file  ?Physical Activity: Not on file  ?Stress: Not on file  ?Social Connections: Not on file  ? ? ?Review of Systems: ?Gen: Denies fever, chills, anorexia. Denies fatigue, weakness, weight loss.  ?CV: Denies chest pain, palpitations, syncope, peripheral edema, and claudication. ?Resp: Denies dyspnea at rest, cough, wheezing, coughing up blood, and pleurisy. ?GI: Denies vomiting blood, jaundice, and fecal incontinence.   Denies dysphagia or odynophagia. ?Derm: Denies rash, itching, dry skin ?Psych: Denies depression, anxiety, memory loss, confusion. No homicidal or suicidal ideation.  ?  Heme: Denies bruising, bleeding, and enlarged lymph nodes. ? ?Physical Exam: ?There were no vitals taken for this visit. ?General:   Alert and oriented. No distress noted. Pleasant and cooperative.  ?Head:  Normocephalic and atraumatic. ?Eyes:  Conjuctiva clear without scleral icterus. ?Heart:  S1, S2 present without murmurs appreciated. ?Lungs:  Clear to auscultation bilaterally. No wheezes, rales, or rhonchi. No distress.  ?Abdomen:  +BS, soft, non-tender and non-distended. No rebound or guarding. No HSM or masses noted. ?Msk:  Symmetrical without gross deformities. Normal posture. ?Extremities:  Without edema. ?Neurologic:  Alert and  oriented x4 ?Psych:  Normal mood and affect. ? ? ? ?Assessment:  ? ? ? ?Plan:  ?*** ? ? ?Aliene Altes, PA-C ?[facility] ?01/10/2022  ?

## 2022-01-10 ENCOUNTER — Ambulatory Visit: Payer: Medicaid Other | Admitting: Gastroenterology

## 2022-01-10 ENCOUNTER — Encounter: Payer: Self-pay | Admitting: Internal Medicine

## 2022-02-04 ENCOUNTER — Emergency Department (HOSPITAL_COMMUNITY)
Admission: EM | Admit: 2022-02-04 | Discharge: 2022-02-04 | Discharge status: Against Medical Advice | Payer: Medicaid Other | Attending: Emergency Medicine | Admitting: Emergency Medicine

## 2022-02-04 ENCOUNTER — Encounter (HOSPITAL_COMMUNITY): Payer: Self-pay

## 2022-02-04 ENCOUNTER — Other Ambulatory Visit: Payer: Self-pay

## 2022-02-04 DIAGNOSIS — Z5321 Procedure and treatment not carried out due to patient leaving prior to being seen by health care provider: Secondary | ICD-10-CM | POA: Diagnosis not present

## 2022-02-04 DIAGNOSIS — K625 Hemorrhage of anus and rectum: Secondary | ICD-10-CM | POA: Diagnosis present

## 2022-02-04 DIAGNOSIS — M791 Myalgia, unspecified site: Secondary | ICD-10-CM | POA: Diagnosis not present

## 2022-02-04 LAB — COMPREHENSIVE METABOLIC PANEL
ALT: 12 U/L (ref 0–44)
AST: 16 U/L (ref 15–41)
Albumin: 4.2 g/dL (ref 3.5–5.0)
Alkaline Phosphatase: 68 U/L (ref 38–126)
Anion gap: 8 (ref 5–15)
BUN: 9 mg/dL (ref 6–20)
CO2: 22 mmol/L (ref 22–32)
Calcium: 9 mg/dL (ref 8.9–10.3)
Chloride: 108 mmol/L (ref 98–111)
Creatinine, Ser: 1.12 mg/dL (ref 0.61–1.24)
GFR, Estimated: >60 mL/min (ref >60)
Glucose, Bld: 99 mg/dL (ref 70–99)
Potassium: 4.3 mmol/L (ref 3.5–5.1)
Sodium: 138 mmol/L (ref 135–145)
Total Bilirubin: 0.8 mg/dL (ref 0.3–1.2)
Total Protein: 7.5 g/dL (ref 6.5–8.1)

## 2022-02-04 LAB — CBC
HCT: 48.5 % (ref 39.0–52.0)
Hemoglobin: 16.8 g/dL (ref 13.0–17.0)
MCH: 33.9 pg (ref 26.0–34.0)
MCHC: 34.6 g/dL (ref 30.0–36.0)
MCV: 97.8 fL (ref 80.0–100.0)
Platelets: 256 10*3/uL (ref 150–400)
RBC: 4.96 MIL/uL (ref 4.22–5.81)
RDW: 12.3 % (ref 11.5–15.5)
WBC: 7.3 10*3/uL (ref 4.0–10.5)
nRBC: 0 % (ref 0.0–0.2)

## 2022-02-04 NOTE — ED Triage Notes (Signed)
Patient states all over body aches with rectal bleeding. Patient states bright red bleeding with clots in the toilet. Patient states toilet is red when he uses it. Patient has hx of bleeding hemorrhoids. Currently being treated for DVTs in  right leg.  ?

## 2022-02-04 NOTE — ED Notes (Signed)
Not in WR

## 2022-02-04 NOTE — ED Notes (Signed)
Attempted to call to vitals signs. Pt not in waiting room.  ?

## 2022-03-04 ENCOUNTER — Emergency Department (HOSPITAL_COMMUNITY)
Admission: EM | Admit: 2022-03-04 | Discharge: 2022-03-04 | Discharge status: Against Medical Advice | Payer: Medicaid Other | Source: Home / Self Care

## 2022-03-15 ENCOUNTER — Telehealth: Payer: Self-pay | Admitting: Hematology and Oncology

## 2022-03-15 NOTE — Telephone Encounter (Signed)
Per provider request called and spoke to pt about appointment reschedule.  Pt confirmed appointment

## 2022-04-07 NOTE — Progress Notes (Deleted)
MRN : [id number]  Lance Cohen is a 51 y.o. (May 05, 1971) male who presents with chief complaint of legs hurt and swell.  History of Present Illness:   The patient presents to the office for follow up regarding right femoral DVT.  DVT was identified at [facility] by Duplex ultrasound about 6 months ago.  The initial symptoms were pain and swelling in the lower extremity.   The patient notes the leg continues to be very painful with dependency and swells quite a bite.  Symptoms are much better with elevation.  The patient notes minimal edema in the morning which steadily worsens throughout the day.   Interval acute on chronic DVT diagnosed in January at [facility] main campus    He has a history of chronic DVT, as well as a history of PE.   The patient has not been using compression therapy at this point.   No SOB or pleuritic chest pains.  No cough or hemoptysis.   No blood per rectum or blood in any sputum.  No excessive bruising per the patient.  No outpatient medications have been marked as taking for the 04/08/22 encounter (Appointment) with Delana Meyer, Dolores Lory, MD.    Past Medical History:  Diagnosis Date   Anxiety    Arthritis    Asthma    Bipolar 1 disorder ([place])    Chronic knee pain    Depression    Dilated aortic root ([place])    DVT (deep venous thrombosis) ([place])    Hypertension    Insomnia    Sinus bradycardia    Sleep apnea    Spinal stenosis    Suicide attempt ([place])    2010 Intentional overdose attempt with Depakote.    Past Surgical History:  Procedure Laterality Date   COLONOSCOPY WITH PROPOFOL N/A 03/18/2019   Procedure: COLONOSCOPY WITH PROPOFOL;  Surgeon: [name], MD;  [facility];  Service: Endoscopy;  Laterality: N/A;  2:30pm   FOOT SURGERY     had peice of wire removed   IVC FILTER REMOVAL N/A 02/24/2017   Procedure: IVC Filter Removal;  Surgeon: [name], MD;  Location: [place] [facility];  Service: Cardiovascular;  Laterality:  N/A;   ORIF RADIAL FRACTURE Right 12/05/2020   Procedure: OPEN REDUCTION INTERNAL FIXATION (ORIF) RADIAL FRACTURE and ULNA FRACTURE;  Surgeon: Carole Civil, MD;  [facility];  Service: Orthopedics;  Laterality: Right;   PERIPHERAL VASCULAR CATHETERIZATION Right 10/22/2016   Procedure: Thrombectomy, thrombolyisis;  Surgeon: [name], MD;  Location: [place] [facility];  Service: Cardiovascular;  Laterality: Right;   PERIPHERAL VASCULAR CATHETERIZATION N/A 10/22/2016   Procedure: IVC Filter Insertion;  Surgeon: [name], MD;  Location: [place] [facility];  Service: Cardiovascular;  Laterality: N/A;   POLYPECTOMY  03/18/2019   Procedure: POLYPECTOMY;  Surgeon: [name], MD;  [facility];  Service: Endoscopy;;    Social History Social History   Tobacco Use   Smoking status: Every Day    Packs/day: 0.50    Types: Cigarettes   Smokeless tobacco: Never  Vaping Use   Vaping Use: Never used  Substance Use Topics   Alcohol use: Yes    Comment: seldom   Drug use: Yes    Types: Marijuana    Comment: smokes for pain    Family History Family History  Problem Relation Age of Onset   Cancer Mother    Heart attack Father  Colon cancer Neg Hx     Allergies  Allergen Reactions   Lovenox [Enoxaparin Sodium] Hives   Bee Venom Swelling   Penicillins Other (See Comments)    PT states that he had symptoms that felt like he was having a heart attack Has patient had a PCN reaction causing immediate rash, facial/tongue/throat swelling, SOB or lightheadedness with hypotension: No Has patient had a PCN reaction causing severe rash involving mucus membranes or skin necrosis: No Has patient had a PCN reaction that required hospitalization No Has patient had a PCN reaction occurring within the last 10 years: No If all of the above answers are "NO", then may proceed with Cephal   Zofran [Ondansetron] Hives   Other Palpitations    Steroiod   Sulfa  Antibiotics Itching     REVIEW OF SYSTEMS (Negative unless checked)  Constitutional: '[]'$ Weight loss  '[]'$ Fever  '[]'$ Chills Cardiac: '[]'$ Chest pain   '[]'$ Chest pressure   '[]'$ Palpitations   '[]'$ Shortness of breath when laying flat   '[]'$ Shortness of breath with exertion. Vascular:  '[]'$ Pain in legs with walking   '[x]'$ Pain in legs at rest  '[]'$ History of DVT   '[]'$ Phlebitis   '[x]'$ Swelling in legs   '[]'$ Varicose veins   '[]'$ Non-healing ulcers Pulmonary:   '[]'$ Uses home oxygen   '[]'$ Productive cough   '[]'$ Hemoptysis   '[]'$ Wheeze  '[]'$ COPD   '[]'$ Asthma Neurologic:  '[]'$ Dizziness   '[]'$ Seizures   '[]'$ History of stroke   '[]'$ History of TIA  '[]'$ Aphasia   '[]'$ Vissual changes   '[]'$ Weakness or numbness in arm   '[]'$ Weakness or numbness in leg Musculoskeletal:   '[]'$ Joint swelling   '[]'$ Joint pain   '[]'$ Low back pain Hematologic:  '[]'$ Easy bruising  '[]'$ Easy bleeding   '[]'$ Hypercoagulable state   '[]'$ Anemic Gastrointestinal:  '[]'$ Diarrhea   '[]'$ Vomiting  '[x]'$ Gastroesophageal reflux/heartburn   '[]'$ Difficulty swallowing. Genitourinary:  '[]'$ Chronic kidney disease   '[]'$ Difficult urination  '[]'$ Frequent urination   '[]'$ Blood in urine Skin:  '[]'$ Rashes   '[]'$ Ulcers  Psychological:  '[]'$ History of anxiety   '[]'$  History of major depression.  Physical Examination  There were no vitals filed for this visit. There is no height or weight on file to calculate BMI. Gen: WD/WN, NAD Head: [place]/AT, No temporalis wasting.  Ear/Nose/Throat: Hearing grossly intact, nares w/o erythema or drainage, pinna without lesions Eyes: PER, EOMI, sclera nonicteric.  Neck: Supple, no gross masses.  No JVD.  Pulmonary:  Good air movement, no audible wheezing, no use of accessory muscles.  Cardiac: RRR, precordium not hyperdynamic. Vascular:  scattered varicosities present bilaterally.  Moderate venous stasis changes to the legs bilaterally.  2+ soft pitting edema  Vessel Right Left  Radial Palpable Palpable  Gastrointestinal: soft, non-distended. No guarding/no peritoneal signs.  Musculoskeletal: M/S 5/5  throughout.  No deformity.  Neurologic: CN 2-12 intact. Pain and light touch intact in extremities.  Symmetrical.  Speech is fluent. Motor exam as listed above. Psychiatric: Judgment intact, Mood & affect appropriate for pt's clinical situation. Dermatologic: Venous rashes no ulcers noted.  No changes consistent with cellulitis. Lymph : No lichenification or skin changes of chronic lymphedema.  CBC Lab Results  Component Value Date   WBC 7.3 02/04/2022   HGB 16.8 02/04/2022   HCT 48.5 02/04/2022   MCV 97.8 02/04/2022   PLT 256 02/04/2022    BMET    Component Value Date/Time   NA 138 02/04/2022 1323   NA 138 03/21/2021 1300   K 4.3 02/04/2022 1323   CL 108 02/04/2022 1323   CO2 22 02/04/2022 1323  GLUCOSE 99 02/04/2022 1323   BUN 9 02/04/2022 1323   BUN 8 03/21/2021 1300   CREATININE 1.12 02/04/2022 1323   CREATININE 1.13 12/06/2021 1442   CALCIUM 9.0 02/04/2022 1323   GFRNONAA >60 02/04/2022 1323   GFRNONAA >60 12/06/2021 1442   GFRAA >60 07/03/2019 1345   CrCl cannot be calculated (Patient's most recent lab result is older than the maximum 21 days allowed.).  COAG Lab Results  Component Value Date   INR 1.11 10/22/2016   INR 1.06 10/18/2016    Radiology No results found.   Assessment/Plan There are no diagnoses linked to this encounter.   Hortencia Pilar, MD  04/07/2022 4:03 PM

## 2022-04-08 ENCOUNTER — Ambulatory Visit (INDEPENDENT_AMBULATORY_CARE_PROVIDER_SITE_OTHER): Payer: Medicaid Other | Admitting: Vascular Surgery

## 2022-04-08 DIAGNOSIS — I82511 Chronic embolism and thrombosis of right femoral vein: Secondary | ICD-10-CM

## 2022-04-08 DIAGNOSIS — K219 Gastro-esophageal reflux disease without esophagitis: Secondary | ICD-10-CM

## 2022-04-08 DIAGNOSIS — I10 Essential (primary) hypertension: Secondary | ICD-10-CM

## 2022-04-08 DIAGNOSIS — J45909 Unspecified asthma, uncomplicated: Secondary | ICD-10-CM

## 2022-05-14 ENCOUNTER — Encounter (HOSPITAL_COMMUNITY): Payer: Self-pay | Admitting: Emergency Medicine

## 2022-05-14 ENCOUNTER — Other Ambulatory Visit: Payer: Self-pay

## 2022-05-14 ENCOUNTER — Emergency Department (HOSPITAL_COMMUNITY): Payer: Medicaid Other

## 2022-05-14 ENCOUNTER — Emergency Department (HOSPITAL_COMMUNITY)
Admission: EM | Admit: 2022-05-14 | Discharge: 2022-05-14 | Disposition: A | Discharge status: Home / Self Care | Payer: Medicaid Other | Attending: Emergency Medicine | Admitting: Emergency Medicine

## 2022-05-14 DIAGNOSIS — Z7901 Long term (current) use of anticoagulants: Secondary | ICD-10-CM | POA: Insufficient documentation

## 2022-05-14 DIAGNOSIS — D72829 Elevated white blood cell count, unspecified: Secondary | ICD-10-CM | POA: Insufficient documentation

## 2022-05-14 DIAGNOSIS — R109 Unspecified abdominal pain: Secondary | ICD-10-CM | POA: Diagnosis present

## 2022-05-14 DIAGNOSIS — N3000 Acute cystitis without hematuria: Secondary | ICD-10-CM

## 2022-05-14 DIAGNOSIS — M549 Dorsalgia, unspecified: Secondary | ICD-10-CM | POA: Insufficient documentation

## 2022-05-14 LAB — COMPREHENSIVE METABOLIC PANEL
ALT: 15 U/L (ref 0–44)
AST: 17 U/L (ref 15–41)
Albumin: 3.6 g/dL (ref 3.5–5.0)
Alkaline Phosphatase: 91 U/L (ref 38–126)
Anion gap: 4 — ABNORMAL LOW (ref 5–15)
BUN: 19 mg/dL (ref 6–20)
CO2: 23 mmol/L (ref 22–32)
Calcium: 8.3 mg/dL — ABNORMAL LOW (ref 8.9–10.3)
Chloride: 111 mmol/L (ref 98–111)
Creatinine, Ser: 1.24 mg/dL (ref 0.61–1.24)
GFR, Estimated: >60 mL/min (ref >60)
Glucose, Bld: 101 mg/dL — ABNORMAL HIGH (ref 70–99)
Potassium: 3.8 mmol/L (ref 3.5–5.1)
Sodium: 138 mmol/L (ref 135–145)
Total Bilirubin: 0.4 mg/dL (ref 0.3–1.2)
Total Protein: 6.7 g/dL (ref 6.5–8.1)

## 2022-05-14 LAB — CBC WITH DIFFERENTIAL/PLATELET
Abs Immature Granulocytes: 0.05 10*3/uL (ref 0.00–0.07)
Basophils Absolute: 0.1 10*3/uL (ref 0.0–0.1)
Basophils Relative: 1 %
Eosinophils Absolute: 0.2 10*3/uL (ref 0.0–0.5)
Eosinophils Relative: 2 %
HCT: 43.3 % (ref 39.0–52.0)
Hemoglobin: 15.4 g/dL (ref 13.0–17.0)
Immature Granulocytes: 0 %
Lymphocytes Relative: 13 %
Lymphs Abs: 1.5 10*3/uL (ref 0.7–4.0)
MCH: 34.4 pg — ABNORMAL HIGH (ref 26.0–34.0)
MCHC: 35.6 g/dL (ref 30.0–36.0)
MCV: 96.7 fL (ref 80.0–100.0)
Monocytes Absolute: 0.2 10*3/uL (ref 0.1–1.0)
Monocytes Relative: 2 %
Neutro Abs: 10 10*3/uL — ABNORMAL HIGH (ref 1.7–7.7)
Neutrophils Relative %: 82 %
Platelets: 236 10*3/uL (ref 150–400)
RBC: 4.48 MIL/uL (ref 4.22–5.81)
RDW: 12.2 % (ref 11.5–15.5)
WBC: 12.1 10*3/uL — ABNORMAL HIGH (ref 4.0–10.5)
nRBC: 0 % (ref 0.0–0.2)

## 2022-05-14 LAB — URINALYSIS, ROUTINE W REFLEX MICROSCOPIC
Bilirubin Urine: NEGATIVE
Glucose, UA: NEGATIVE mg/dL
Hgb urine dipstick: NEGATIVE
Ketones, ur: 5 mg/dL — AB
Nitrite: NEGATIVE
Protein, ur: NEGATIVE mg/dL
Specific Gravity, Urine: 1.024 (ref 1.005–1.030)
pH: 5 (ref 5.0–8.0)

## 2022-05-14 MED ORDER — OXYCODONE-ACETAMINOPHEN 5-325 MG PO TABS: 1.0000 | ORAL_TABLET | Freq: Four times a day (QID) | ORAL | 0 refills | Status: DC | PRN

## 2022-05-14 MED ORDER — HYDROMORPHONE HCL 1 MG/ML IJ SOLN
1.0000 mg | Freq: Once | INTRAMUSCULAR | Status: AC
  Administered 2022-05-14: 1 mg via INTRAMUSCULAR
  Filled 2022-05-14: qty 1

## 2022-05-14 MED ORDER — SULFAMETHOXAZOLE-TRIMETHOPRIM 800-160 MG PO TABS: 1.0000 | ORAL_TABLET | Freq: Two times a day (BID) | ORAL | 0 refills | Status: AC

## 2022-05-14 NOTE — ED Provider Notes (Signed)
[facility] EMERGENCY DEPARTMENT Provider Note   CSN: [id number] Arrival date & time: 05/14/22  1122     History  Chief Complaint  Patient presents with   Back Pain    Lance Cohen is a 51 y.o. male.  Patient complains of right flank pain.,  Patient has a history of a DVT  The history is provided by the patient. No language interpreter was used.  Back Pain Location:  Generalized Quality:  Aching Radiates to:  Does not radiate Pain severity:  Moderate Onset quality:  Sudden Timing:  Constant Progression:  Worsening Chronicity:  New Associated symptoms: no abdominal pain, no chest pain and no headaches        Home Medications Prior to Admission medications   Medication Sig Start Date End Date Taking? Authorizing Provider  oxyCODONE-acetaminophen (PERCOCET/ROXICET) 5-325 MG tablet Take 1 tablet by mouth every 6 (six) hours as needed for severe pain. 05/14/22  Yes Milton Ferguson, MD  sulfamethoxazole-trimethoprim (BACTRIM DS) 800-160 MG tablet Take 1 tablet by mouth 2 (two) times daily for 7 days. 05/14/22 05/21/22 Yes Milton Ferguson, MD  albuterol (VENTOLIN HFA) 108 (90 Base) MCG/ACT inhaler Inhale 1-2 puffs into the lungs every 6 (six) hours as needed for wheezing or shortness of breath. 01/17/21   Dorie Rank, MD  ELIQUIS 5 MG TABS tablet Take 5 mg by mouth 2 (two) times daily. 11/29/21   [provider]  EPINEPHrine 0.3 mg/0.3 mL IJ SOAJ injection Inject 0.3 mg into the muscle as needed for anaphylaxis (For severe allergic reaction only with difficulty breathing, tongue swelling or throat swelling.). 04/08/21   Rancour, Annie Main, MD  LATUDA 20 MG TABS tablet SMARTSIG:1 Tablet(s) By Mouth Every Evening 11/30/21   [provider]  QUEtiapine (SEROQUEL) 25 MG tablet Take 25 mg by mouth at bedtime. 08/30/21   [provider]  SUMAtriptan (IMITREX) 50 MG tablet Take 1 tablet (50 mg total) by mouth every 2 (two) hours as needed for migraine. May repeat in 2  hours if headache persists or recurs. 09/11/21   Alric Ran, MD  SYMBICORT 80-4.5 MCG/ACT inhaler Inhale into the lungs. 11/08/21   [provider]  Vitamin D, Ergocalciferol, (DRISDOL) 1.25 MG (50000 UNIT) CAPS capsule Take 50,000 Units by mouth once a week. 11/08/21   [provider]      Allergies    Lovenox [enoxaparin sodium], Bee venom, Penicillins, Zofran [ondansetron], Other, and Sulfa antibiotics    Review of Systems   Review of Systems  Constitutional:  Negative for appetite change and fatigue.  HENT:  Negative for congestion, ear discharge and sinus pressure.   Eyes:  Negative for discharge.  Respiratory:  Negative for cough.   Cardiovascular:  Negative for chest pain.  Gastrointestinal:  Negative for abdominal pain and diarrhea.  Genitourinary:  Negative for frequency and hematuria.  Musculoskeletal:  Positive for back pain.  Skin:  Negative for rash.  Neurological:  Negative for seizures and headaches.  Psychiatric/Behavioral:  Negative for hallucinations.     Physical Exam Updated Vital Signs BP 124/84   Pulse 74   Temp 97.7 F (36.5 C) (Oral)   Resp 16   Ht '5\' 11"'$  (1.803 m)   Wt 111.6 kg   SpO2 97%   BMI 34.31 kg/m  Physical Exam Vitals reviewed.  Constitutional:      Appearance: He is well-developed.  HENT:     Head: Normocephalic.     Nose: Nose normal.  Eyes:     General:  No scleral icterus.    Conjunctiva/sclera: Conjunctivae normal.  Neck:     Thyroid: No thyromegaly.  Cardiovascular:     Rate and Rhythm: Normal rate and regular rhythm.     Heart sounds: No murmur heard.    No friction rub. No gallop.  Pulmonary:     Breath sounds: No stridor. No wheezing or rales.  Chest:     Chest wall: No tenderness.  Abdominal:     General: There is no distension.     Tenderness: There is no abdominal tenderness. There is no rebound.  Genitourinary:    Comments: Tender right flank Musculoskeletal:        General: Normal range of  motion.     Cervical back: Neck supple.  Lymphadenopathy:     Cervical: No cervical adenopathy.  Skin:    Findings: No erythema or rash.  Neurological:     Mental Status: He is oriented to person, place, and time.     Motor: No abnormal muscle tone.     Coordination: Coordination normal.  Psychiatric:        Behavior: Behavior normal.     ED Results / Procedures / Treatments   Labs (all labs ordered are listed, but only abnormal results are displayed) Labs Reviewed  URINALYSIS, ROUTINE W REFLEX MICROSCOPIC - Abnormal; Notable for the following components:      Result Value   APPearance HAZY (*)    Ketones, ur 5 (*)    Leukocytes,Ua LARGE (*)    Bacteria, UA RARE (*)    All other components within normal limits  CBC WITH DIFFERENTIAL/PLATELET - Abnormal; Notable for the following components:   WBC 12.1 (*)    MCH 34.4 (*)    Neutro Abs 10.0 (*)    All other components within normal limits  COMPREHENSIVE METABOLIC PANEL - Abnormal; Notable for the following components:   Glucose, Bld 101 (*)    Calcium 8.3 (*)    Anion gap 4 (*)    All other components within normal limits    EKG None  Radiology CT Renal Stone Study  Result Date: 05/14/2022 CLINICAL DATA:  Nephrolithiasis. EXAM: CT ABDOMEN AND PELVIS WITHOUT CONTRAST TECHNIQUE: Multidetector CT imaging of the abdomen and pelvis was performed following the standard protocol without IV contrast. RADIATION DOSE REDUCTION: This exam was performed according to the departmental dose-optimization program which includes automated exposure control, adjustment of the mA and/or kV according to patient size and/or use of iterative reconstruction technique. COMPARISON:  Comparison December 28, 2021 FINDINGS: Lower chest: Basilar atelectasis without effusion or consolidative changes. Hepatobiliary: Smooth hepatic contours. No focal, suspicious hepatic lesion. No pericholecystic stranding. No gross biliary duct distension. Pancreas: Mild  atrophy of the pancreas without signs of inflammation or gross ductal dilation. Spleen: Normal size and contour. Adrenals/Urinary Tract: Normal appearance of the adrenal glands. Smooth renal contours. No hydronephrosis or ureteral dilation. No nephrolithiasis. No ureteral calculi. Mild perinephric stranding. Urinary bladder without signs of adjacent stranding or gross thickening. Urinary bladder is under distended. Stomach/Bowel: Colonic diverticulosis.  Normal appendix. Stomach under distended limiting assessment. No surrounding stranding. No sign of small bowel obstruction. Vascular/Lymphatic: Normal caliber of the abdominal aorta. No signs of adenopathy in the abdomen or in the pelvis. Reproductive: Unremarkable to the extent evaluated on CT. Other: No ascites.  No pneumoperitoneum. Musculoskeletal: No acute bone finding. No destructive bone process. Spinal degenerative changes. IMPRESSION: 1. No signs of nephrolithiasis or hydronephrosis. 2. Colonic diverticulosis without evidence of acute diverticulitis.  3. Normal appendix. Electronically Signed   By: Zetta Bills M.D.   On: 05/14/2022 12:50    Procedures Procedures    Medications Ordered in ED Medications  HYDROmorphone (DILAUDID) injection 1 mg (1 mg Intramuscular Given 05/14/22 1201)    ED Course/ Medical Decision Making/ A&P                           Medical Decision Making Amount and/or Complexity of Data Reviewed Labs: ordered. Radiology: ordered.  Risk Prescription drug management.  This patient presents to the ED for concern of flank pain, this involves an extensive number of treatment options, and is a complaint that carries with it a high risk of complications and morbidity.  The differential diagnosis includes kidney stone, UTI   Co morbidities that complicate the patient evaluation  History of back pain   Additional history obtained:  Additional history obtained from patient External records from outside source  obtained and reviewed including hospital record   Lab Tests:  I Ordered, and personally interpreted labs.  The pertinent results include: CBC shows white count 12.1, UA suggest UTI   Imaging Studies ordered:  I ordered imaging studies including CT renal I independently visualized and interpreted imaging which showed negative I agree with the radiologist interpretation   Cardiac Monitoring: / EKG:  The patient was maintained on a cardiac monitor.  I personally viewed and interpreted the cardiac monitored which showed an underlying rhythm of: Normal sinus rhythm   Consultations Obtained:  No consultant Problem List / ED Course / Critical interventions / Medication management  Plan pain and UTI I ordered medication including Dilaudid for pain Reevaluation of the patient after these medicines showed that the patient improved I have reviewed the patients home medicines and have made adjustments as needed   Social Determinants of Health:  None   Test / Admission - Considered:  Urine culture as outpatient  Patient with urinary tract infection.  Patient will be started on Bactrim and given Percocet for pain will follow-up with PCP        Final Clinical Impression(s) / ED Diagnoses Final diagnoses:  None    Rx / DC Orders ED Discharge Orders          Ordered    sulfamethoxazole-trimethoprim (BACTRIM DS) 800-160 MG tablet  2 times daily        05/14/22 1444    oxyCODONE-acetaminophen (PERCOCET/ROXICET) 5-325 MG tablet  Every 6 hours PRN        05/14/22 1444              Milton Ferguson, MD 05/14/22 1722

## 2022-05-14 NOTE — ED Triage Notes (Signed)
Pt presents with chronic lower back pain, per pt has DVT in right hip but unable to be treated.

## 2022-05-14 NOTE — ED Notes (Signed)
Patient transported to CT 

## 2022-05-14 NOTE — Discharge Instructions (Signed)
Follow-up with your family doctor in the next week.  Next week

## 2022-05-29 ENCOUNTER — Other Ambulatory Visit: Payer: Self-pay | Admitting: Hematology and Oncology

## 2022-05-29 DIAGNOSIS — I82411 Acute embolism and thrombosis of right femoral vein: Secondary | ICD-10-CM

## 2022-05-30 ENCOUNTER — Inpatient Hospital Stay: Payer: Medicaid Other | Attending: Adult Health

## 2022-05-30 ENCOUNTER — Inpatient Hospital Stay: Payer: Medicaid Other | Admitting: Hematology and Oncology

## 2022-06-05 ENCOUNTER — Other Ambulatory Visit: Payer: Medicaid Other

## 2022-06-05 ENCOUNTER — Ambulatory Visit: Payer: Medicaid Other | Admitting: Hematology and Oncology

## 2022-06-07 ENCOUNTER — Other Ambulatory Visit: Payer: Self-pay

## 2022-06-07 ENCOUNTER — Encounter (HOSPITAL_COMMUNITY): Payer: Self-pay | Admitting: Emergency Medicine

## 2022-06-07 ENCOUNTER — Emergency Department (HOSPITAL_COMMUNITY)
Admission: EM | Admit: 2022-06-07 | Discharge: 2022-06-07 | Disposition: A | Discharge status: Home / Self Care | Payer: Medicaid Other | Attending: Emergency Medicine | Admitting: Emergency Medicine

## 2022-06-07 DIAGNOSIS — Z7901 Long term (current) use of anticoagulants: Secondary | ICD-10-CM | POA: Diagnosis not present

## 2022-06-07 DIAGNOSIS — R1032 Left lower quadrant pain: Secondary | ICD-10-CM | POA: Diagnosis present

## 2022-06-07 DIAGNOSIS — N12 Tubulo-interstitial nephritis, not specified as acute or chronic: Secondary | ICD-10-CM

## 2022-06-07 DIAGNOSIS — N111 Chronic obstructive pyelonephritis: Secondary | ICD-10-CM | POA: Diagnosis not present

## 2022-06-07 LAB — URINALYSIS, ROUTINE W REFLEX MICROSCOPIC
Bacteria, UA: NONE SEEN
Bilirubin Urine: NEGATIVE
Glucose, UA: NEGATIVE mg/dL
Hgb urine dipstick: NEGATIVE
Ketones, ur: NEGATIVE mg/dL
Nitrite: NEGATIVE
Protein, ur: NEGATIVE mg/dL
Specific Gravity, Urine: 1.01 (ref 1.005–1.030)
WBC, UA: >50 WBC/hpf — ABNORMAL HIGH (ref 0–5)
pH: 6 (ref 5.0–8.0)

## 2022-06-07 MED ORDER — ACETAMINOPHEN 500 MG PO TABS: 500.0000 mg | ORAL_TABLET | Freq: Four times a day (QID) | ORAL | 0 refills | Status: DC | PRN

## 2022-06-07 MED ORDER — PROMETHAZINE HCL 25 MG PO TABS: 25.0000 mg | ORAL_TABLET | Freq: Four times a day (QID) | ORAL | 0 refills | Status: DC | PRN

## 2022-06-07 MED ORDER — LIDOCAINE HCL (PF) 1 % IJ SOLN
INTRAMUSCULAR | Status: AC
  Filled 2022-06-07: qty 2

## 2022-06-07 MED ORDER — PROMETHAZINE HCL 25 MG RE SUPP: 25.0000 mg | Freq: Four times a day (QID) | RECTAL | 0 refills | Status: DC | PRN

## 2022-06-07 MED ORDER — PROMETHAZINE HCL 12.5 MG PO TABS
25.0000 mg | ORAL_TABLET | Freq: Once | ORAL | Status: AC
  Administered 2022-06-07: 25 mg via ORAL
  Filled 2022-06-07: qty 2

## 2022-06-07 MED ORDER — CEFTRIAXONE SODIUM 1 G IJ SOLR
1.0000 g | Freq: Once | INTRAMUSCULAR | Status: AC
  Administered 2022-06-07: 1 g via INTRAMUSCULAR
  Filled 2022-06-07: qty 10

## 2022-06-07 MED ORDER — CEPHALEXIN 500 MG PO CAPS: 500.0000 mg | ORAL_CAPSULE | Freq: Three times a day (TID) | ORAL | 0 refills | Status: DC

## 2022-06-07 MED ORDER — HYDROCODONE-ACETAMINOPHEN 5-325 MG PO TABS
1.0000 | ORAL_TABLET | Freq: Once | ORAL | Status: AC
  Administered 2022-06-07: 1 via ORAL
  Filled 2022-06-07: qty 1

## 2022-06-07 MED ORDER — NAPROXEN 375 MG PO TABS: 375.0000 mg | ORAL_TABLET | Freq: Every day | ORAL | 0 refills | Status: DC | PRN

## 2022-06-07 NOTE — ED Triage Notes (Signed)
Pt presents with back pain and dysuria x 3 days.

## 2022-06-07 NOTE — ED Provider Notes (Signed)
[facility] EMERGENCY DEPARTMENT Provider Note   CSN: [id number] Arrival date & time: 06/07/22  1235     History  Chief Complaint  Patient presents with   Back Pain    Lance Cohen is a 51 y.o. male.  HPI     Pt comnes in with cc of pain flank pain, left side.  Patient states that he has had 3-day of burning with urination and pain in the back.  Pain in the back has gotten worse.  He has associated nausea.  Patient denies any penile discharge and states that he is in a monogamous relationship with his wife.  He has no concerns for STDs.  He states that he was seen in the ER recently for similar complaints and was diagnosed as UTI.  Patient denies diabetes history.  He is on blood thinners for DVT.  Review of system is negative for any fevers, chills. Home Medications Prior to Admission medications   Medication Sig Start Date End Date Taking? Authorizing Provider  acetaminophen (TYLENOL) 500 MG tablet Take 1 tablet (500 mg total) by mouth every 6 (six) hours as needed. 06/07/22  Yes Delvecchio Madole, MD  cephALEXin (KEFLEX) 500 MG capsule Take 1 capsule (500 mg total) by mouth 3 (three) times daily. 06/07/22  Yes Athanasios Heldman, MD  naproxen (NAPROSYN) 375 MG tablet Take 1 tablet (375 mg total) by mouth daily as needed. 06/07/22  Yes Varney Biles, MD  promethazine (PHENERGAN) 25 MG suppository Place 1 suppository (25 mg total) rectally every 6 (six) hours as needed for nausea or vomiting. 06/07/22  Yes Varney Biles, MD  promethazine (PHENERGAN) 25 MG tablet Take 1 tablet (25 mg total) by mouth every 6 (six) hours as needed for nausea or vomiting. 06/07/22  Yes Michoel Kunin, MD  albuterol (VENTOLIN HFA) 108 (90 Base) MCG/ACT inhaler Inhale 1-2 puffs into the lungs every 6 (six) hours as needed for wheezing or shortness of breath. 01/17/21   Dorie Rank, MD  ELIQUIS 5 MG TABS tablet Take 5 mg by mouth 2 (two) times daily. 11/29/21   [provider]  EPINEPHrine 0.3 mg/0.3  mL IJ SOAJ injection Inject 0.3 mg into the muscle as needed for anaphylaxis (For severe allergic reaction only with difficulty breathing, tongue swelling or throat swelling.). 04/08/21   Rancour, Annie Main, MD  LATUDA 20 MG TABS tablet SMARTSIG:1 Tablet(s) By Mouth Every Evening 11/30/21   [provider]  oxyCODONE-acetaminophen (PERCOCET/ROXICET) 5-325 MG tablet Take 1 tablet by mouth every 6 (six) hours as needed for severe pain. 05/14/22   Milton Ferguson, MD  QUEtiapine (SEROQUEL) 25 MG tablet Take 25 mg by mouth at bedtime. 08/30/21   [provider]  SUMAtriptan (IMITREX) 50 MG tablet Take 1 tablet (50 mg total) by mouth every 2 (two) hours as needed for migraine. May repeat in 2 hours if headache persists or recurs. 09/11/21   Alric Ran, MD  SYMBICORT 80-4.5 MCG/ACT inhaler Inhale into the lungs. 11/08/21   [provider]  Vitamin D, Ergocalciferol, (DRISDOL) 1.25 MG (50000 UNIT) CAPS capsule Take 50,000 Units by mouth once a week. 11/08/21   [provider]      Allergies    Lovenox [enoxaparin sodium], Bee venom, Penicillins, Zofran [ondansetron], Other, and Sulfa antibiotics    Review of Systems   Review of Systems  All other systems reviewed and are negative.   Physical Exam Updated Vital Signs BP 109/75 (BP Location: Left Arm)   Pulse 64   Temp  97.9 F (36.6 C) (Oral)   Resp 16   Ht '5\' 11"'$  (1.803 m)   Wt 91.6 kg   SpO2 99%   BMI 28.17 kg/m  Physical Exam Vitals and nursing note reviewed.  Constitutional:      Appearance: He is well-developed.  HENT:     Head: Atraumatic.  Cardiovascular:     Rate and Rhythm: Normal rate.  Pulmonary:     Effort: Pulmonary effort is normal.  Abdominal:     Tenderness: There is no abdominal tenderness.  Genitourinary:    Comments: Left flank tenderness Musculoskeletal:     Cervical back: Neck supple.  Skin:    General: Skin is warm.  Neurological:     Mental Status: He is alert and oriented  to person, place, and time.     ED Results / Procedures / Treatments   Labs (all labs ordered are listed, but only abnormal results are displayed) Labs Reviewed  URINALYSIS, ROUTINE W REFLEX MICROSCOPIC - Abnormal; Notable for the following components:      Result Value   APPearance HAZY (*)    Leukocytes,Ua LARGE (*)    WBC, UA >50 (*)    All other components within normal limits  URINE CULTURE    EKG None  Radiology No results found.  Procedures Procedures    Medications Ordered in ED Medications  lidocaine (PF) (XYLOCAINE) 1 % injection (has no administration in time range)  cefTRIAXone (ROCEPHIN) injection 1 g (1 g Intramuscular Given 06/07/22 1444)  promethazine (PHENERGAN) tablet 25 mg (25 mg Oral Given 06/07/22 1444)  HYDROcodone-acetaminophen (NORCO/VICODIN) 5-325 MG per tablet 1 tablet (1 tablet Oral Given 06/07/22 1444)    ED Course/ Medical Decision Making/ A&P                           Medical Decision Making Amount and/or Complexity of Data Reviewed Labs: ordered.  Risk OTC drugs. Prescription drug management.   This patient presents to the ED with chief complaint(s) of left flank pain, nausea, burning with urination with pertinent past medical history of UTI which further complicates the presenting complaint.  Patient also has history of kidney stone, but states that this pain is constant.  He has not seen any blood in the urine.  He is on blood thinners.   The differential diagnosis includes cystitis, pyelonephritis, kidney stone, diverticulitis  The initial plan is to urine analysis and urine culture. If the results are reassuring, we will get basic labs and consider CT imaging.   Additional history obtained: Records reviewed  patient had a urine analysis along with basic blood work-up and CT abdomen and pelvis without contrast in July.  CT renal stone was negative for diverticulitis.  UA was concerning for UTI  Independent labs interpretation:   The following labs were independently interpreted: UA shows positive pyuria, bacteriuria   Treatment and Reassessment: Results of urine analysis discussed with the patient.  We will add urine culture to his UA.  Patient will be started on Keflex for 10 days.  IM ceftriaxone given here.  Return precautions discussed with the patient.  He is nontoxic and does not have immunosuppression, suspicion for perinephric abscess is low.   Final Clinical Impression(s) / ED Diagnoses Final diagnoses:  Pyelonephritis    Rx / DC Orders ED Discharge Orders          Ordered    cephALEXin (KEFLEX) 500 MG capsule  3 times daily  06/07/22 1436    promethazine (PHENERGAN) 25 MG suppository  Every 6 hours PRN        06/07/22 1437    promethazine (PHENERGAN) 25 MG tablet  Every 6 hours PRN        06/07/22 1437    acetaminophen (TYLENOL) 500 MG tablet  Every 6 hours PRN        06/07/22 1437    naproxen (NAPROSYN) 375 MG tablet  Daily PRN        06/07/22 1437              Varney Biles, MD 06/07/22 1531

## 2022-06-07 NOTE — Discharge Instructions (Addendum)
You had a kidney infection based on our work-up.  Please take the antibiotics that are prescribed.  See your primary care doctor in 1 week.    Please return to the ER if your symptoms worsen; you have increased pain, fevers, chills, inability to keep any medications down, confusion. Otherwise see the outpatient doctor as requested.

## 2022-06-09 LAB — URINE CULTURE: Culture: <10000 — AB

## 2022-07-02 ENCOUNTER — Emergency Department (HOSPITAL_COMMUNITY)
Admission: EM | Admit: 2022-07-02 | Discharge: 2022-07-02 | Discharge status: Against Medical Advice | Payer: Medicaid Other

## 2022-07-22 ENCOUNTER — Emergency Department (HOSPITAL_COMMUNITY): Payer: Medicaid Other

## 2022-07-22 ENCOUNTER — Other Ambulatory Visit: Payer: Self-pay

## 2022-07-22 ENCOUNTER — Emergency Department (HOSPITAL_COMMUNITY)
Admission: EM | Admit: 2022-07-22 | Discharge: 2022-07-22 | Disposition: A | Discharge status: Home / Self Care | Payer: Medicaid Other | Attending: Emergency Medicine | Admitting: Emergency Medicine

## 2022-07-22 ENCOUNTER — Encounter (HOSPITAL_COMMUNITY): Payer: Self-pay

## 2022-07-22 DIAGNOSIS — R519 Headache, unspecified: Secondary | ICD-10-CM | POA: Insufficient documentation

## 2022-07-22 DIAGNOSIS — M79651 Pain in right thigh: Secondary | ICD-10-CM | POA: Diagnosis not present

## 2022-07-22 DIAGNOSIS — I10 Essential (primary) hypertension: Secondary | ICD-10-CM | POA: Insufficient documentation

## 2022-07-22 DIAGNOSIS — N419 Inflammatory disease of prostate, unspecified: Secondary | ICD-10-CM

## 2022-07-22 DIAGNOSIS — Z7901 Long term (current) use of anticoagulants: Secondary | ICD-10-CM | POA: Diagnosis not present

## 2022-07-22 DIAGNOSIS — G8929 Other chronic pain: Secondary | ICD-10-CM | POA: Diagnosis not present

## 2022-07-22 DIAGNOSIS — J45909 Unspecified asthma, uncomplicated: Secondary | ICD-10-CM | POA: Insufficient documentation

## 2022-07-22 DIAGNOSIS — M542 Cervicalgia: Secondary | ICD-10-CM | POA: Diagnosis not present

## 2022-07-22 DIAGNOSIS — N410 Acute prostatitis: Secondary | ICD-10-CM | POA: Insufficient documentation

## 2022-07-22 DIAGNOSIS — R109 Unspecified abdominal pain: Secondary | ICD-10-CM

## 2022-07-22 DIAGNOSIS — R1030 Lower abdominal pain, unspecified: Secondary | ICD-10-CM | POA: Diagnosis present

## 2022-07-22 LAB — URINALYSIS, ROUTINE W REFLEX MICROSCOPIC
Bilirubin Urine: NEGATIVE
Glucose, UA: NEGATIVE mg/dL
Ketones, ur: NEGATIVE mg/dL
Nitrite: NEGATIVE
Protein, ur: NEGATIVE mg/dL
Specific Gravity, Urine: 1.019 (ref 1.005–1.030)
WBC, UA: >50 WBC/hpf — ABNORMAL HIGH (ref 0–5)
pH: 5 (ref 5.0–8.0)

## 2022-07-22 LAB — CBC WITH DIFFERENTIAL/PLATELET
Abs Immature Granulocytes: 0.02 10*3/uL (ref 0.00–0.07)
Basophils Absolute: 0 10*3/uL (ref 0.0–0.1)
Basophils Relative: 0 %
Eosinophils Absolute: 0.1 10*3/uL (ref 0.0–0.5)
Eosinophils Relative: 1 %
HCT: 49 % (ref 39.0–52.0)
Hemoglobin: 17 g/dL (ref 13.0–17.0)
Immature Granulocytes: 0 %
Lymphocytes Relative: 6 %
Lymphs Abs: 0.5 10*3/uL — ABNORMAL LOW (ref 0.7–4.0)
MCH: 33.4 pg (ref 26.0–34.0)
MCHC: 34.7 g/dL (ref 30.0–36.0)
MCV: 96.3 fL (ref 80.0–100.0)
Monocytes Absolute: 0.3 10*3/uL (ref 0.1–1.0)
Monocytes Relative: 3 %
Neutro Abs: 8.6 10*3/uL — ABNORMAL HIGH (ref 1.7–7.7)
Neutrophils Relative %: 90 %
Platelets: 255 10*3/uL (ref 150–400)
RBC: 5.09 MIL/uL (ref 4.22–5.81)
RDW: 12.4 % (ref 11.5–15.5)
WBC: 9.6 10*3/uL (ref 4.0–10.5)
nRBC: 0 % (ref 0.0–0.2)

## 2022-07-22 LAB — COMPREHENSIVE METABOLIC PANEL
ALT: 11 U/L (ref 0–44)
AST: 16 U/L (ref 15–41)
Albumin: 4.2 g/dL (ref 3.5–5.0)
Alkaline Phosphatase: 70 U/L (ref 38–126)
Anion gap: 7 (ref 5–15)
BUN: 17 mg/dL (ref 6–20)
CO2: 26 mmol/L (ref 22–32)
Calcium: 9 mg/dL (ref 8.9–10.3)
Chloride: 103 mmol/L (ref 98–111)
Creatinine, Ser: 1.24 mg/dL (ref 0.61–1.24)
GFR, Estimated: >60 mL/min (ref >60)
Glucose, Bld: 95 mg/dL (ref 70–99)
Potassium: 4.5 mmol/L (ref 3.5–5.1)
Sodium: 136 mmol/L (ref 135–145)
Total Bilirubin: 0.7 mg/dL (ref 0.3–1.2)
Total Protein: 7.5 g/dL (ref 6.5–8.1)

## 2022-07-22 MED ORDER — TAMSULOSIN HCL 0.4 MG PO CAPS: 0.4000 mg | ORAL_CAPSULE | Freq: Every day | ORAL | 0 refills | Status: DC

## 2022-07-22 MED ORDER — LACTATED RINGERS IV BOLUS
1000.0000 mL | Freq: Once | INTRAVENOUS | Status: AC
  Administered 2022-07-22: 1000 mL via INTRAVENOUS

## 2022-07-22 MED ORDER — KETOROLAC TROMETHAMINE 15 MG/ML IJ SOLN
15.0000 mg | Freq: Once | INTRAMUSCULAR | Status: AC
  Administered 2022-07-22: 15 mg via INTRAVENOUS
  Filled 2022-07-22: qty 1

## 2022-07-22 MED ORDER — CIPROFLOXACIN HCL 500 MG PO TABS: 500.0000 mg | ORAL_TABLET | Freq: Two times a day (BID) | ORAL | 0 refills | Status: AC

## 2022-07-22 MED ORDER — METOCLOPRAMIDE HCL 5 MG/ML IJ SOLN
10.0000 mg | Freq: Once | INTRAMUSCULAR | Status: AC
  Administered 2022-07-22: 10 mg via INTRAVENOUS
  Filled 2022-07-22: qty 2

## 2022-07-22 NOTE — ED Notes (Addendum)
Lab at [facility], then pt to CT

## 2022-07-22 NOTE — Discharge Instructions (Addendum)
You were seen here today in the emergency room for evaluation of your multiple complaints.  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  Please make sure you are remaining compliant with your medication and taking it as prescribed.  I have included the information for a vascular practice that I would like for you to follow-up with.  The information is included, please make sure you call to schedule an appointment.  Additionally, your neck CT showed spondylosis, otherwise unremarkable. I likely think this was your chronic pain with a headache. I am glad you are feeling better after the headache. You can take Tylenol as needed for pain.  Additionally, I am setting up with Cipro which is an antibiotic that you will take twice daily for the next 10 days.  Additionally I am giving you some Flomax which she will take once daily in the morning.  I would like for you to follow-up with a urologist that I have included into the suture paperwork as I think you have prostatitis.  Please call to schedule an appointment.  If you have any concerns, new or worsening symptoms, please make sure to return to the nearest emergency room for evaluation.  Get help right away if: You have chills. You feel light-headed. You feel like you may faint. You cannot pee. You have blood or clumps of blood (blood clots) in your pee. Summary Prostatitis is swelling of the prostate gland. There are different types of prostatitis. Treatment depends on the type that you have. Take over-the-counter and prescription medicines only as told by your doctor. Get help right away of you have chills, feel light-headed, or feel like you may faint. Also get help right away if you cannot pee or you have blood or clumps of blood in your pee.

## 2022-07-22 NOTE — ED Triage Notes (Signed)
Pt presents to ED with complaints of right sided groin pain radiating down right leg and around to the back, pt also nauseated, states pain has been chronic for "years"

## 2022-07-22 NOTE — ED Notes (Signed)
Pt not in room, pt in CT.  

## 2022-07-22 NOTE — ED Provider Notes (Signed)
[facility] EMERGENCY DEPARTMENT Provider Note   CSN: [id number] Arrival date & time: 07/22/22  1250     History Chief Complaint  Patient presents with   Groin Pain    Lance Cohen is a 51 y.o. male with history of anxiety, asthma, bipolar, DVT, hypertension presents the emergency department for evaluation of multiple complaints.  Patient reports that he is had his head and neck hurting for the past few days.  Denies any blurry vision.  He reports he thinks this is chronic pain although slightly worse.  He denies any trauma to the area.  Denies any heavy lifting.  Additionally, mentions that he has been having some right flank pain however it is tender to touch.  Denies any abdominal pain, nausea, or vomiting.  Denies any changes to his bowel or any bloody bowel movements or dark bowel movements.  In addition to all of this.  He complains of some upper right thigh pain that is radiating down to his knee.  Denies any involvement of his penis, testicle, or scrotum.  Denies any discharge or concern for any STDs.  He does mention however he has had some pain for urination for the past month.  Denies any urgency or frequency.   Groin Pain Associated symptoms include headaches. Pertinent negatives include no chest pain, no abdominal pain and no shortness of breath.       Home Medications Prior to Admission medications   Medication Sig Start Date End Date Taking? Authorizing Provider  acetaminophen (TYLENOL) 500 MG tablet Take 1 tablet (500 mg total) by mouth every 6 (six) hours as needed. 06/07/22   Varney Biles, MD  albuterol (VENTOLIN HFA) 108 (90 Base) MCG/ACT inhaler Inhale 1-2 puffs into the lungs every 6 (six) hours as needed for wheezing or shortness of breath. 01/17/21   Dorie Rank, MD  cephALEXin (KEFLEX) 500 MG capsule Take 1 capsule (500 mg total) by mouth 3 (three) times daily. 06/07/22   Varney Biles, MD  ELIQUIS 5 MG TABS tablet Take 5 mg by mouth 2 (two) times daily.  11/29/21   [provider]  EPINEPHrine 0.3 mg/0.3 mL IJ SOAJ injection Inject 0.3 mg into the muscle as needed for anaphylaxis (For severe allergic reaction only with difficulty breathing, tongue swelling or throat swelling.). 04/08/21   Rancour, Annie Main, MD  LATUDA 20 MG TABS tablet SMARTSIG:1 Tablet(s) By Mouth Every Evening 11/30/21   [provider]  naproxen (NAPROSYN) 375 MG tablet Take 1 tablet (375 mg total) by mouth daily as needed. 06/07/22   Varney Biles, MD  oxyCODONE-acetaminophen (PERCOCET/ROXICET) 5-325 MG tablet Take 1 tablet by mouth every 6 (six) hours as needed for severe pain. 05/14/22   Milton Ferguson, MD  promethazine (PHENERGAN) 25 MG suppository Place 1 suppository (25 mg total) rectally every 6 (six) hours as needed for nausea or vomiting. 06/07/22   Varney Biles, MD  promethazine (PHENERGAN) 25 MG tablet Take 1 tablet (25 mg total) by mouth every 6 (six) hours as needed for nausea or vomiting. 06/07/22   Varney Biles, MD  QUEtiapine (SEROQUEL) 25 MG tablet Take 25 mg by mouth at bedtime. 08/30/21   [provider]  SUMAtriptan (IMITREX) 50 MG tablet Take 1 tablet (50 mg total) by mouth every 2 (two) hours as needed for migraine. May repeat in 2 hours if headache persists or recurs. 09/11/21   Alric Ran, MD  SYMBICORT 80-4.5 MCG/ACT inhaler Inhale into the lungs. 11/08/21   [provider]  Vitamin  D, Ergocalciferol, (DRISDOL) 1.25 MG (50000 UNIT) CAPS capsule Take 50,000 Units by mouth once a week. 11/08/21   [provider]      Allergies    Lovenox [enoxaparin sodium], Bee venom, Penicillins, Zofran [ondansetron], Other, and Sulfa antibiotics    Review of Systems   Review of Systems  Constitutional:  Negative for chills and fever.  HENT:  Negative for congestion and rhinorrhea.   Eyes:  Negative for photophobia and visual disturbance.  Respiratory:  Negative for shortness of breath.   Cardiovascular:  Negative for  chest pain.  Gastrointestinal:  Negative for abdominal pain, constipation, diarrhea, nausea and vomiting.  Genitourinary:  Positive for dysuria and flank pain. Negative for hematuria, penile discharge, penile pain, penile swelling, scrotal swelling, testicular pain and urgency.  Musculoskeletal:  Positive for neck pain.  Neurological:  Positive for headaches. Negative for dizziness, syncope, weakness and light-headedness.    Physical Exam Updated Vital Signs BP (!) 134/90   Pulse 85   Temp 98.3 F (36.8 C) (Oral)   Resp 18   Ht '5\' 11"'$  (1.803 m)   Wt 90.7 kg   SpO2 98%   BMI 27.89 kg/m  Physical Exam Vitals and nursing note reviewed. Exam conducted with a chaperone present (tech).  Constitutional:      General: He is not in acute distress.    Appearance: He is not ill-appearing or toxic-appearing.  HENT:     Mouth/Throat:     Comments: Poor dentition.  Eyes:     Extraocular Movements: Extraocular movements intact.     Pupils: Pupils are equal, round, and reactive to light.  Neck:     Comments: No midline tenderness palpation.  Pain is mainly left-sided going down to the shoulder.  No overlying skin changes noted.  No step-offs or deformities.  No increased erythema or warmth to the area.  Patient has full range of motion however does have some increased pain with turning his head to the right. Cardiovascular:     Rate and Rhythm: Normal rate.  Pulmonary:     Effort: Pulmonary effort is normal. No respiratory distress.  Abdominal:     General: Bowel sounds are normal. There is no distension.     Palpations: Abdomen is soft.     Tenderness: There is no guarding or rebound.  Genitourinary:    Penis: Normal.      Testes: Normal.     Comments: Enlarged prostate on exam.  Musculoskeletal:     Cervical back: Normal range of motion.     Right lower leg: No edema.     Left lower leg: No edema.     Comments: Palpable DP and PT pulses bilaterally.  Compartments are soft.   Sensation intact throughout.  Patient has diffuse right upper thigh tenderness palpation.  No increased erythema or warmth.  No overlying skin changes noted.  No swelling noted. Temperature and coloration appear and feel symmetric bilaterally.   No midline or paraspinal cervical, thoracic, or lumbar tenderness to palpation.   Skin:    General: Skin is warm and dry.  Neurological:     General: No focal deficit present.     Mental Status: He is alert.     GCS: GCS eye subscore is 4. GCS verbal subscore is 5. GCS motor subscore is 6.     Cranial Nerves: Cranial nerves 2-12 are intact. No cranial nerve deficit, dysarthria or facial asymmetry.     Sensory: No sensory deficit.  Motor: No weakness or pronator drift.     Gait: Gait normal.     Comments: Patient alert and oriented.  GCS 15.  Cranial nerves II through XII intact.  No facial droop noted.  Patient answering questions appropriately with appropriate speech.  Sensation intact throughout.  Strength is 5 out of 5 in patient's upper and lower bilateral extremities.  No pronator drift.  Ambulatory with ease.     ED Results / Procedures / Treatments   Labs (all labs ordered are listed, but only abnormal results are displayed) Labs Reviewed  CBC WITH DIFFERENTIAL/PLATELET - Abnormal; Notable for the following components:      Result Value   Neutro Abs 8.6 (*)    Lymphs Abs 0.5 (*)    All other components within normal limits  URINALYSIS, ROUTINE W REFLEX MICROSCOPIC - Abnormal; Notable for the following components:   Hgb urine dipstick SMALL (*)    Leukocytes,Ua LARGE (*)    WBC, UA >50 (*)    Bacteria, UA RARE (*)    All other components within normal limits  COMPREHENSIVE METABOLIC PANEL    EKG None  Radiology CT Renal Stone Study  Result Date: 07/22/2022 CLINICAL DATA:  Right-sided groin pain EXAM: CT ABDOMEN AND PELVIS WITHOUT CONTRAST TECHNIQUE: Multidetector CT imaging of the abdomen and pelvis was performed following  the standard protocol without IV contrast. RADIATION DOSE REDUCTION: This exam was performed according to the departmental dose-optimization program which includes automated exposure control, adjustment of the mA and/or kV according to patient size and/or use of iterative reconstruction technique. COMPARISON:  CT May 14, 2022 FINDINGS: Lower chest: No acute abnormality. Hepatobiliary: Unremarkable noncontrast enhanced appearance of the hepatic parenchyma. Gallbladder is unremarkable. No biliary ductal dilation. Pancreas: No pancreatic ductal dilation or evidence of acute inflammation. Spleen: No splenomegaly. Adrenals/Urinary Tract: Bilateral adrenal glands appear normal. No hydronephrosis. Punctate nonobstructive left interpolar renal stone. No obstructive ureteral or bladder calculi. Urinary bladder is unremarkable for degree of distension. Stomach/Bowel: No radiopaque enteric contrast material was administered. Stomach is unremarkable for degree of distension. No pathologic dilation of small or large bowel. The appendix and terminal ileum appear normal. No evidence of acute bowel inflammation. Colonic diverticulosis without findings of acute diverticulitis. Vascular/Lymphatic: Normal caliber abdominal aorta. No pathologically enlarged abdominal or pelvic lymph nodes. Reproductive: Prostate is unremarkable. Other: No significant abdominopelvic free fluid. Musculoskeletal: Multilevel degenerative changes spine. IMPRESSION: 1. No acute abnormality in the abdomen or pelvis. 2. Punctate nonobstructive left interpolar renal stone. 3. Colonic diverticulosis without findings of acute diverticulitis. Electronically Signed   By: Dahlia Bailiff M.D.   On: 07/22/2022 15:19   CT Cervical Spine Wo Contrast  Result Date: 07/22/2022 CLINICAL DATA:  Chronic neck pain, nausea EXAM: CT CERVICAL SPINE WITHOUT CONTRAST TECHNIQUE: Multidetector CT imaging of the cervical spine was performed without intravenous contrast.  Multiplanar CT image reconstructions were also generated. RADIATION DOSE REDUCTION: This exam was performed according to the departmental dose-optimization program which includes automated exposure control, adjustment of the mA and/or kV according to patient size and/or use of iterative reconstruction technique. COMPARISON:  04/11/2020 FINDINGS: Alignment: Alignment is grossly anatomic. Skull base and vertebrae: No acute fracture. No primary bone lesion or focal pathologic process. Soft tissues and spinal canal: No prevertebral fluid or swelling. No visible canal hematoma. Disc levels: Mild multilevel spondylosis. Prominent uncovertebral hypertrophy and disc osteophyte complex at C3-4 and C4-5 result in symmetrical neural foraminal encroachment. Upper chest: Airway is widely patent. Visualized portions of the  lung apices are clear. Other: Reconstructed images demonstrate no additional findings. IMPRESSION: 1. No acute cervical spine fracture. 2. Mild cervical spondylosis greatest at C3-4 and C4-5. Electronically Signed   By: Randa Ngo M.D.   On: 07/22/2022 15:16   CT Head Wo Contrast  Result Date: 07/22/2022 CLINICAL DATA:  Nausea, chronic neck pain, headache EXAM: CT HEAD WITHOUT CONTRAST TECHNIQUE: Contiguous axial images were obtained from the base of the skull through the vertex without intravenous contrast. RADIATION DOSE REDUCTION: This exam was performed according to the departmental dose-optimization program which includes automated exposure control, adjustment of the mA and/or kV according to patient size and/or use of iterative reconstruction technique. COMPARISON:  07/04/2021 FINDINGS: Brain: No acute infarct or hemorrhage. Lateral ventricles and midline structures are unremarkable. There are no acute extra-axial fluid collections. No mass effect. Vascular: No hyperdense vessel or unexpected calcification. Skull: Normal. Negative for fracture or focal lesion. Sinuses/Orbits: No acute finding.  Other: None. IMPRESSION: 1. No acute intracranial process. Electronically Signed   By: Randa Ngo M.D.   On: 07/22/2022 15:13   US Venous Img Lower Unilateral Right  Result Date: 07/22/2022 CLINICAL DATA:  Right lower extremity pain. History of previous DVT, on anticoagulation. Evaluate for acute or chronic DVT. EXAM: RIGHT LOWER EXTREMITY VENOUS DOPPLER ULTRASOUND TECHNIQUE: Gray-scale sonography with graded compression, as well as color Doppler and duplex ultrasound were performed to evaluate the lower extremity deep venous systems from the level of the common femoral vein and including the common femoral, femoral, profunda femoral, popliteal and calf veins including the posterior tibial, peroneal and gastrocnemius veins when visible. The superficial great saphenous vein was also interrogated. Spectral Doppler was utilized to evaluate flow at rest and with distal augmentation maneuvers in the common femoral, femoral and popliteal veins. COMPARISON:  Bilateral lower extremity venous Doppler ultrasound-10/18/2021 (positive for DVT involving the right deep femoral, femoral, posterior tibial and peroneal veins) FINDINGS: Contralateral Common Femoral Vein: Respiratory phasicity is normal and symmetric with the symptomatic side. No evidence of thrombus. Normal compressibility. Common Femoral Vein: No evidence of thrombus. Normal compressibility, respiratory phasicity and response to augmentation. Saphenofemoral Junction: No evidence of thrombus. Normal compressibility and flow on color Doppler imaging. Profunda Femoral Vein: No evidence of acute or chronic thrombus. Normal compressibility and flow on color Doppler imaging. 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 Popliteal Vein: No evidence of acute or chronic thrombus. Normal compressibility,  respiratory phasicity and response to augmentation. Calf Veins: There is chronic occlusive DVT involving one of the paired divisions of the right peroneal vein (image 40), similar to the 09/2021 examination. The adjacent paired division of the right peroneal vein appears patent. No evidence of acute or chronic DVT within either paired divisions of the right posterior tibial vein. Superficial Great Saphenous Vein: No evidence of thrombus. Normal compressibility. Other Findings:  None. IMPRESSION: 1. No evidence of acute DVT within the right lower extremity. 2. 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 Electronically Signed   By: Sandi Mariscal M.D.   On: 07/22/2022 15:08     Procedures Procedures    Medications  Ordered in ED Medications  lactated ringers bolus 1,000 mL (has no administration in time range)  ketorolac (TORADOL) 15 MG/ML injection 15 mg (has no administration in time range)  metoCLOPramide (REGLAN) injection 10 mg (has no administration in time range)    ED Course/ Medical Decision Making/ A&P                           Medical Decision Making Amount and/or Complexity of Data Reviewed Labs: ordered. Radiology: ordered.  Risk Prescription drug management.   51 year old male presents emerged department for evaluation of multiple complaints including dysuria, back pain, neck pain, and head pain.  Differential diagnosis includes is limited to bad headache, migraine, brain bleed, subarachnoid hemorrhage, chronic pain, kidney stone, UTI, prostatitis.  Vital signs are unremarkable.  Patient physical exam as listed above.  After discussion with my attending, will order multiple scans including CT head, neck, CT renal, and right lower extremity DVT  as well as basic labs.  I independently reviewed and interpreted the patient's labs.  CMP is unremarkable.  No electrolyte or LFT abnormality.  BC shows no leukocytosis however the patient does have a slightly increased neutrophil count and slightly decreased lymphocyte count.  No anemia seen.  Urinalysis shows large leuks with greater than 50 white blood cells with rare bacteria.  There is white blood cells present.  On previous chart evaluation, it appears the patient always has this large leuk with greater than 50 white blood cells and rare bacteria.  When asked about this.  I suggested the patient may have prostatitis.  He reports that he has been told that before and is already taking antibiotics for this but he does not remember what they are.  Given this information, I did consult our urology team and spoke to Dr. Gilford Rile who recommended Cipro for 500 mg twice daily for 10 days and some Flomax and following up with urology.  We will culture his urine as well.  CT imaging of his head and neck show no acute intracranial process. 1. No acute cervical spine fracture. 2. Mild cervical spondylosis greatest at C3-4 and C4-5.   Marland Kitchen  Given the CT findings of the head and neck, this is likely his chronic neck pain exacerbation.   CT renal shows  1. No acute abnormality in the abdomen or pelvis. 2. Punctate nonobstructive left interpolar renal stone. 3. Colonic diverticulosis without findings of acute diverticulitis.   US shows  1. No evidence of acute DVT within the right lower extremity. 2. 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 Because of patient's continued pain, will consult vascular surgery. I spoke with Paulo Fruit who recommended that the patient can  follow up outpatient, otherwise no acute intervention needs to be done.   On revaluation, the patient is feeling much better from the toradol and fluids and would like to go home. I doubt any emergent problem giving his lab and imagine results as well as assurance from consultations.   I discussed with him his labs and imaging results. Discussed his needed follow up with the two different specialities. Discussed his medications. The patient verbalized his understanding and agrees to the plan. The patient is stable and being discharged home in  good condition.   I discussed this case with my attending physician who cosigned this note including patient's presenting symptoms, physical exam, and planned diagnostics and interventions. Attending physician stated agreement with plan or made changes to plan which were implemented.   Final Clinical Impression(s) / ED Diagnoses Final diagnoses:  Right thigh pain  Flank pain  Chronic neck pain  Bad headache  Prostatitis, unspecified prostatitis type    Rx / DC Orders ED Discharge Orders          Ordered    ciprofloxacin (CIPRO) 500 MG tablet  2 times daily        07/22/22 1904    tamsulosin (FLOMAX) 0.4 MG CAPS capsule  Daily after supper        07/22/22 1904              Sherrell Puller, [place] 07/25/22 1827    Milton Ferguson, MD 07/29/22 1052

## 2022-08-02 ENCOUNTER — Encounter: Payer: Medicaid Other | Admitting: Vascular Surgery

## 2022-08-05 IMAGING — DX DG FOREARM 2V*R*
5 series · 5 of 5 positions shown · non-contrast
Comparison: None.

CLINICAL DATA: Recent ORIF of ulna and radius.

EXAM:
RIGHT FOREARM - 2 VIEW

[forearm ap (1 of 3)]
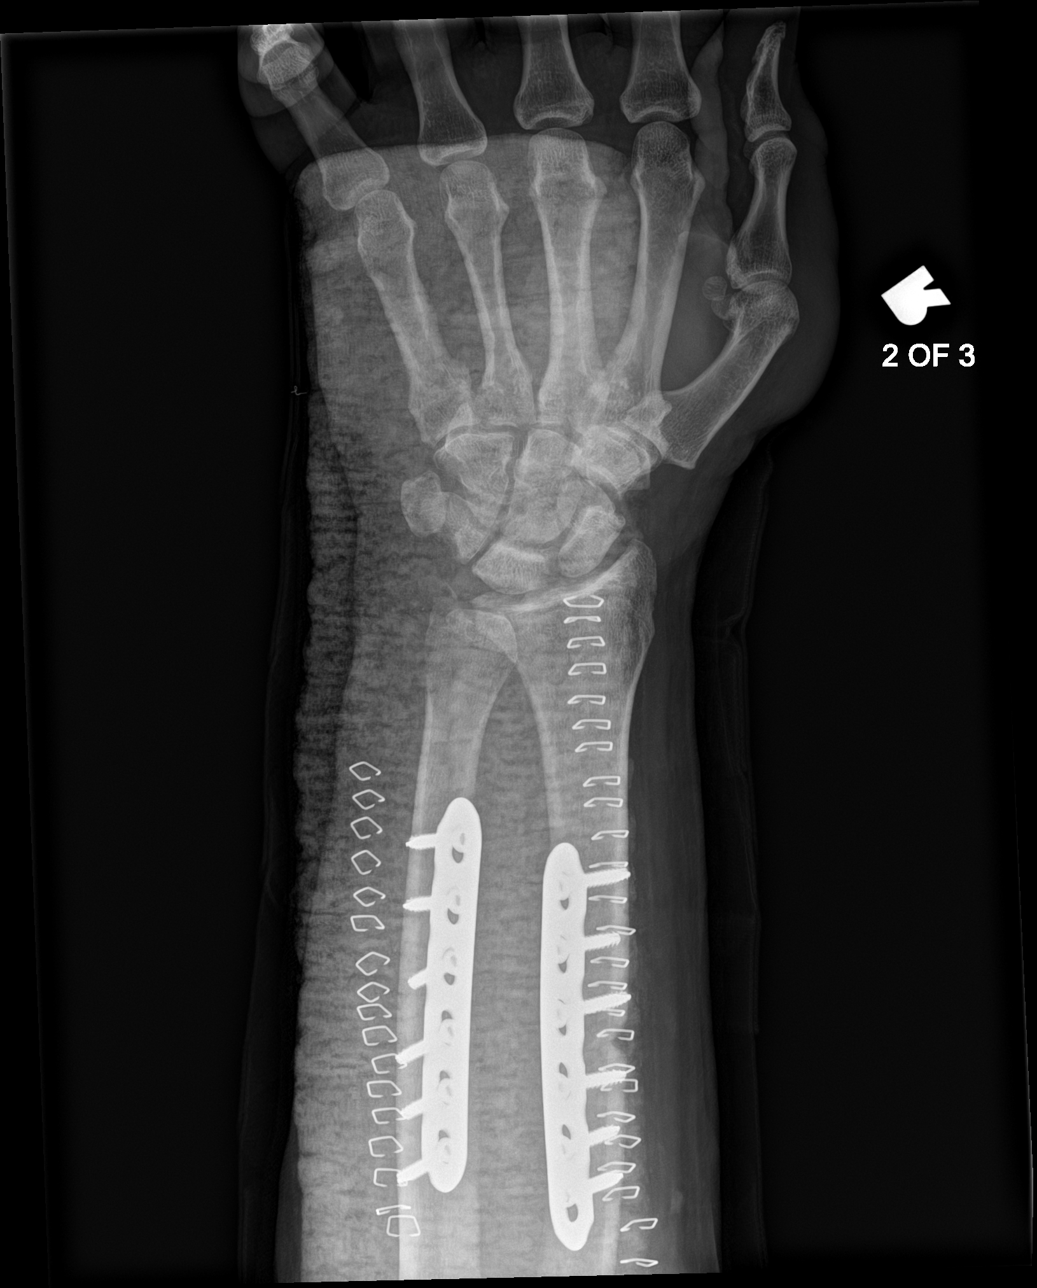

[forearm lat (1 of 2)]
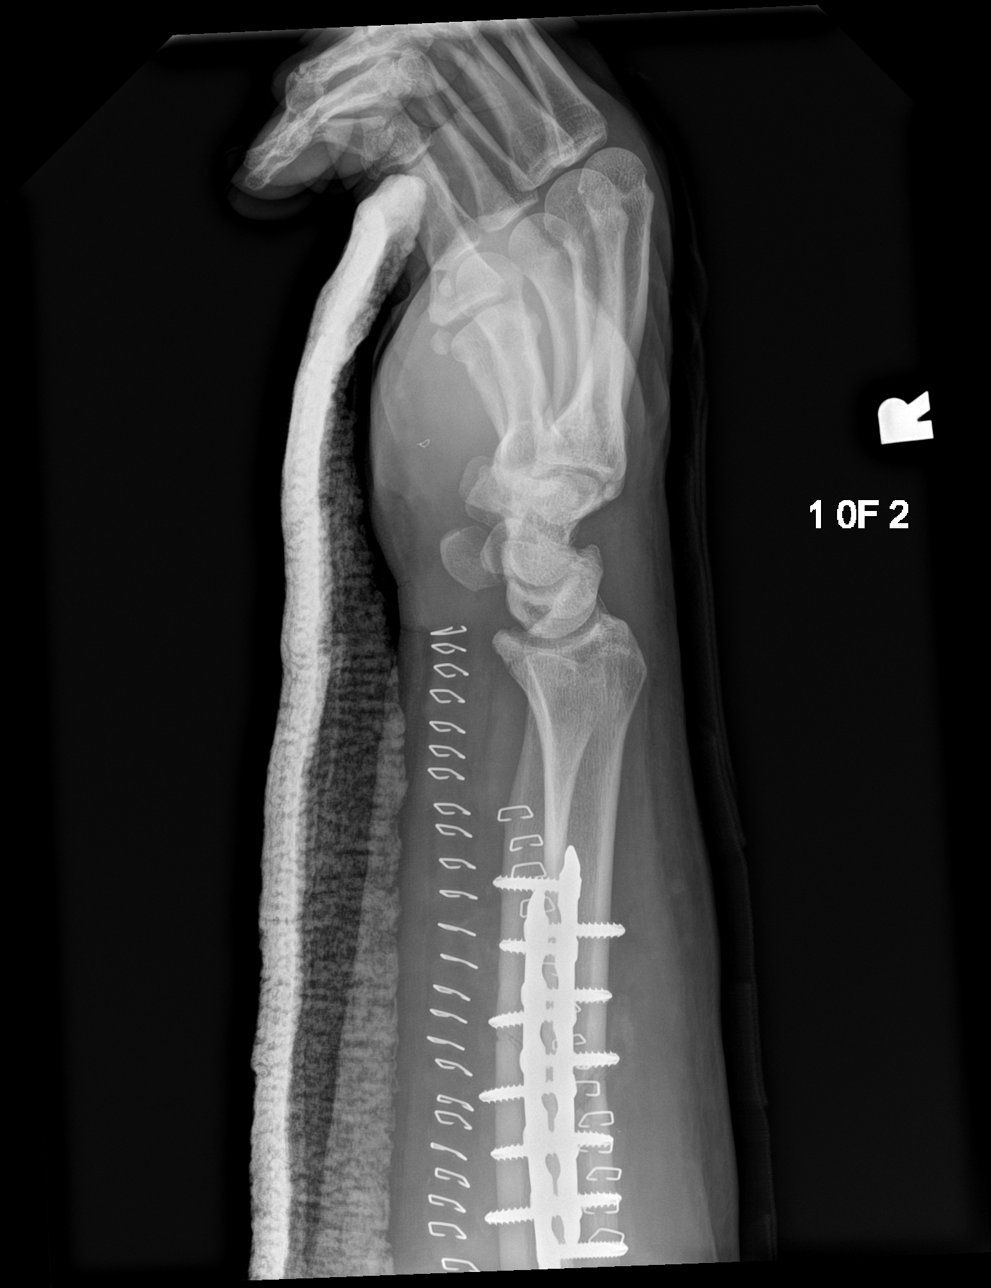

[forearm ap (2 of 3)]
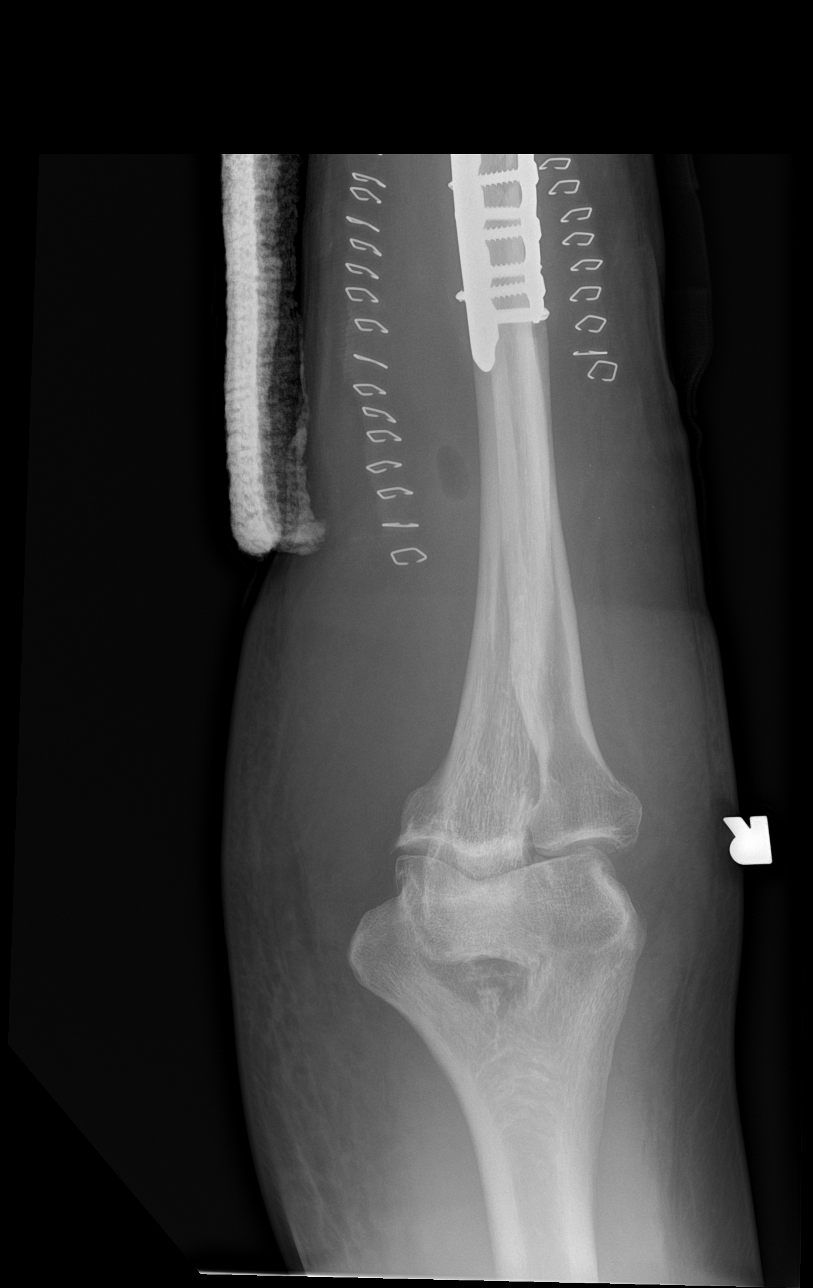

[forearm lat (2 of 2)]
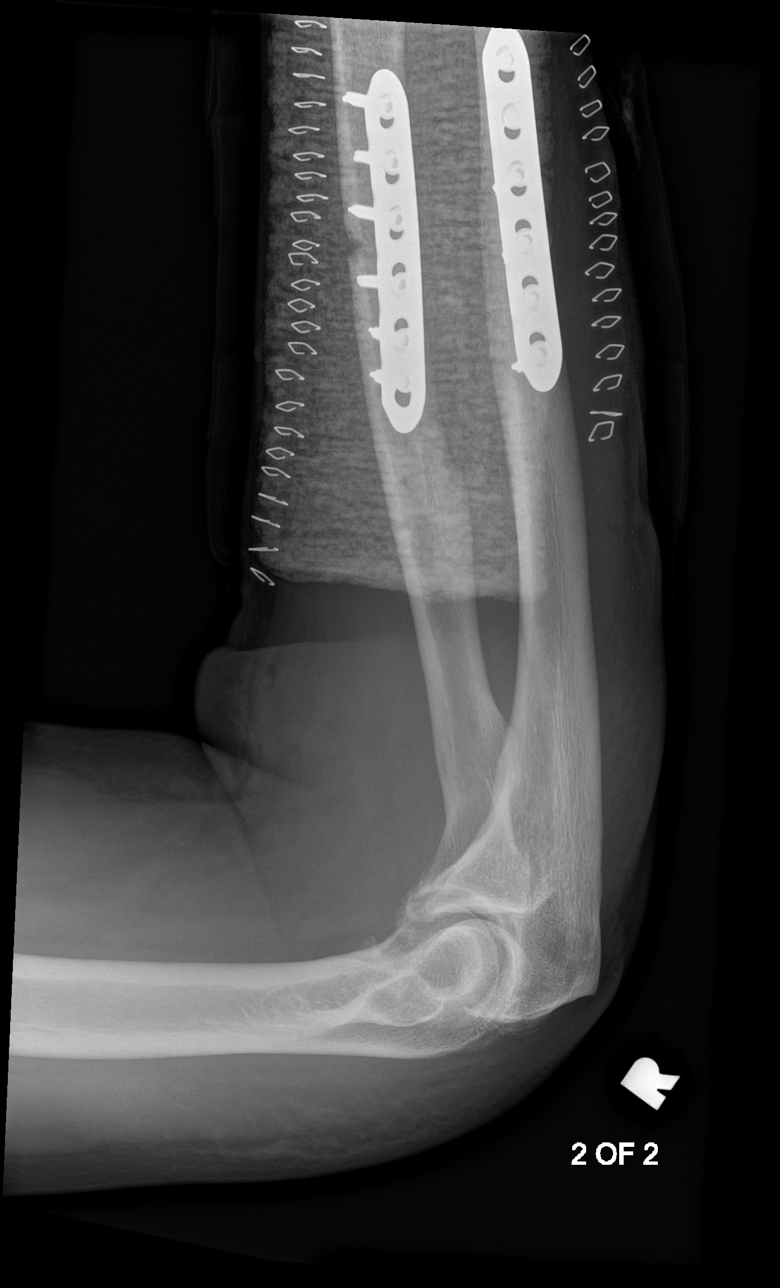

[forearm ap (3 of 3)]
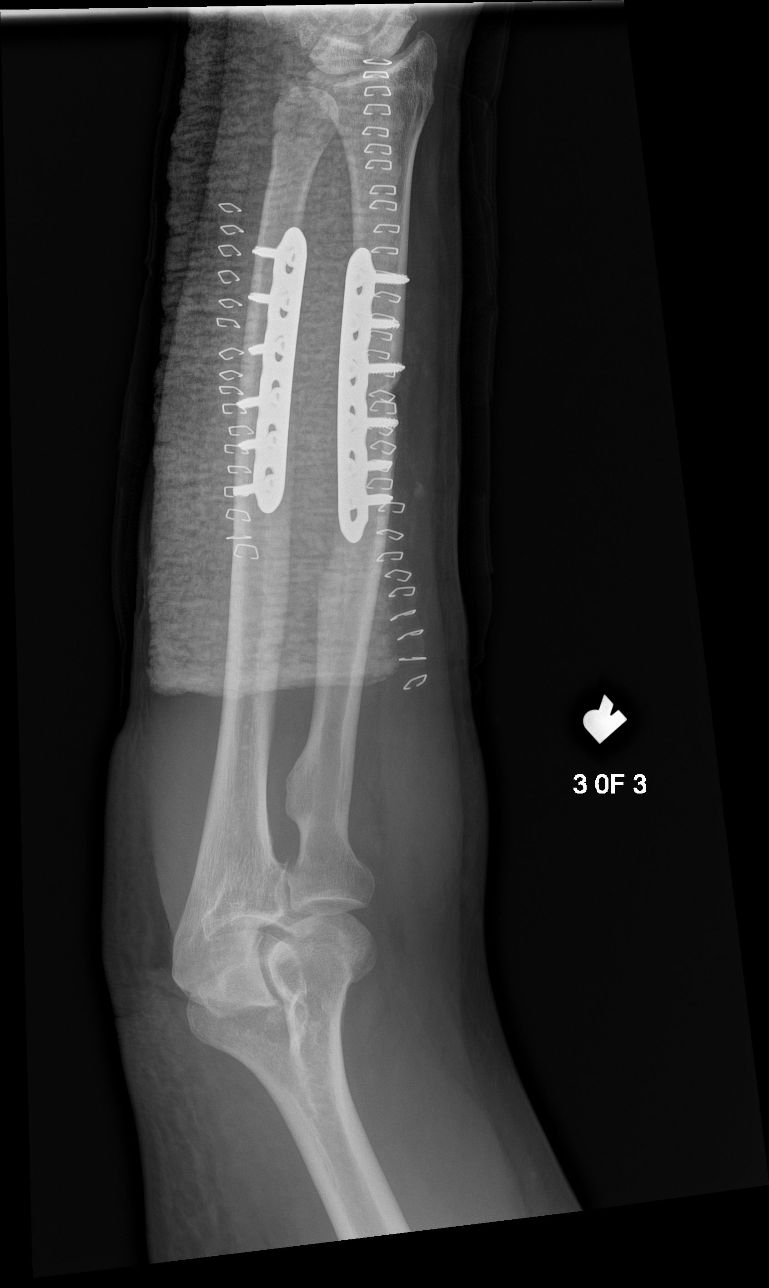

[5 of 5 positions shown; findings below may reference images not displayed]

FINDINGS: Postoperative changes of internal fixation of the distal radius and
ulna. A splint and skin staples. No unexpected finding.
IMPRESSION: Expected postoperative appearance of internal fixation of the distal
radius and ulna.

## 2022-08-08 NOTE — Progress Notes (Signed)
Office Note     CC:  Chronic DVT - post thrombotic syndrome Requesting Provider:  Ginny Forth*  HPI: Lance Cohen is a 51 y.o. (September 19, 1971) male who presents at the request of Nsumanganyi, [name], NP for evaluation of Right leg pain from chronic DVT.  On exam, Lance Cohen was in visible pain, accompanied by his wife.  He described the pain as being multifactorial, and was not sure if if he was having right leg pain versus pelvic pain-not sure if it was from his prior DVT versus prostate.  He described the pain as being excruciating.  They do not worsen with ambulation or exercise. He continues to have trouble urinating.  He denies varicose veins, significant changes in right lower extremity size.  Does note some edema by days end.  No history of vascular or vein surgery.  Has not worn compression stockings.  Prior DVT was appreciated in 2002 involving the right deep femoral vein, PT and peroneal veins.  Prior to this he had another DVT in the femoral vein.  Recent ultrasound demonstrated chronic DVT in the right femoral vein, peroneal vein.  Tobacco hx:  smoker  Past Medical History:  Diagnosis Date   Anxiety    Arthritis    Asthma    Bipolar 1 disorder ([place])    Chronic knee pain    Depression    Dilated aortic root (HCC)    DVT (deep venous thrombosis) ([employer])    Hypertension    Insomnia    Sinus bradycardia    Sleep apnea    Spinal stenosis    Suicide attempt ([place])    2010 Intentional overdose attempt with Depakote.    Past Surgical History:  Procedure Laterality Date   COLONOSCOPY WITH PROPOFOL N/A 03/18/2019   Procedure: COLONOSCOPY WITH PROPOFOL;  Surgeon: [name], MD;  [facility]: [facility];  Service: Endoscopy;  Laterality: N/A;  2:30pm   FOOT SURGERY     had peice of wire removed   IVC FILTER REMOVAL N/A 02/24/2017   Procedure: IVC Filter Removal;  Surgeon: [name], MD;  Location: [facility];  Service: Cardiovascular;   Laterality: N/A;   ORIF RADIAL FRACTURE Right 12/05/2020   Procedure: OPEN REDUCTION INTERNAL FIXATION (ORIF) RADIAL FRACTURE and ULNA FRACTURE;  Surgeon: Carole Civil, MD;  Location: [facility];  Service: Orthopedics;  Laterality: Right;   PERIPHERAL VASCULAR CATHETERIZATION Right 10/22/2016   Procedure: Thrombectomy, thrombolyisis;  Surgeon: [name], MD;  Location: [facility];  Service: Cardiovascular;  Laterality: Right;   PERIPHERAL VASCULAR CATHETERIZATION N/A 10/22/2016   Procedure: IVC Filter Insertion;  Surgeon: [name], MD;  Location: [facility];  Service: Cardiovascular;  Laterality: N/A;   POLYPECTOMY  03/18/2019   Procedure: POLYPECTOMY;  Surgeon: [name], MD;  Location: [facility];  Service: Endoscopy;;    Social History   Socioeconomic History   Marital status: Married    Spouse name: Not on file   Number of children: Not on file   Years of education: Not on file   Highest education level: Not on file  Occupational History   Not on file  Tobacco Use   Smoking status: Every Day    Packs/day: 0.50    Types: Cigarettes   Smokeless tobacco: Never  Vaping Use   Vaping Use: Never used  Substance and Sexual Activity   Alcohol use: Yes    Comment: seldom  Drug use: Yes    Types: Marijuana    Comment: smokes for pain   Sexual activity: Yes  Other Topics Concern   Not on file  Social History Narrative   Not on file   Social Determinants of Health   Financial Resource Strain: Not on file  Food Insecurity: Not on file  Transportation Needs: Not on file  Physical Activity: Not on file  Stress: Not on file  Social Connections: Not on file  Intimate Partner Violence: Not on file    Family History  Problem Relation Age of Onset   Cancer Mother    Heart attack Father    Colon cancer Neg Hx     Current Outpatient Medications  Medication Sig Dispense Refill   acetaminophen (TYLENOL) 500 MG tablet Take 1 tablet  (500 mg total) by mouth every 6 (six) hours as needed. 30 tablet 0   albuterol (VENTOLIN HFA) 108 (90 Base) MCG/ACT inhaler Inhale 1-2 puffs into the lungs every 6 (six) hours as needed for wheezing or shortness of breath. 6.7 g 0   cephALEXin (KEFLEX) 500 MG capsule Take 1 capsule (500 mg total) by mouth 3 (three) times daily. 30 capsule 0   ELIQUIS 5 MG TABS tablet Take 5 mg by mouth 2 (two) times daily.     EPINEPHrine 0.3 mg/0.3 mL IJ SOAJ injection Inject 0.3 mg into the muscle as needed for anaphylaxis (For severe allergic reaction only with difficulty breathing, tongue swelling or throat swelling.). 1 each 0   LATUDA 20 MG TABS tablet SMARTSIG:1 Tablet(s) By Mouth Every Evening     naproxen (NAPROSYN) 375 MG tablet Take 1 tablet (375 mg total) by mouth daily as needed. 5 tablet 0   oxyCODONE-acetaminophen (PERCOCET/ROXICET) 5-325 MG tablet Take 1 tablet by mouth every 6 (six) hours as needed for severe pain. 15 tablet 0   promethazine (PHENERGAN) 25 MG suppository Place 1 suppository (25 mg total) rectally every 6 (six) hours as needed for nausea or vomiting. 12 each 0   promethazine (PHENERGAN) 25 MG tablet Take 1 tablet (25 mg total) by mouth every 6 (six) hours as needed for nausea or vomiting. 20 tablet 0   QUEtiapine (SEROQUEL) 25 MG tablet Take 25 mg by mouth at bedtime.     SUMAtriptan (IMITREX) 50 MG tablet Take 1 tablet (50 mg total) by mouth every 2 (two) hours as needed for migraine. May repeat in 2 hours if headache persists or recurs. 10 tablet 0   SYMBICORT 80-4.5 MCG/ACT inhaler Inhale into the lungs.     tamsulosin (FLOMAX) 0.4 MG CAPS capsule Take 1 capsule (0.4 mg total) by mouth daily after supper. 10 capsule 0   Vitamin D, Ergocalciferol, (DRISDOL) 1.25 MG (50000 UNIT) CAPS capsule Take 50,000 Units by mouth once a week.     No current facility-administered medications for this visit.    Allergies  Allergen Reactions   Lovenox [Enoxaparin Sodium] Hives   Bee Venom  Swelling   Penicillins Other (See Comments)    PT states that he had symptoms that felt like he was having a heart attack Has patient had a PCN reaction causing immediate rash, facial/tongue/throat swelling, SOB or lightheadedness with hypotension: No Has patient had a PCN reaction causing severe rash involving mucus membranes or skin necrosis: No Has patient had a PCN reaction that required hospitalization No Has patient had a PCN reaction occurring within the last 10 years: No If all of the above answers are "NO", then may  proceed with Cephal   Zofran [Ondansetron] Hives   Other Palpitations    Steroiod   Sulfa Antibiotics Itching     REVIEW OF SYSTEMS:   '[X]'$  denotes positive finding, '[ ]'$  denotes negative finding Cardiac  Comments:  Chest pain or chest pressure:    Shortness of breath upon exertion:    Short of breath when lying flat:    Irregular heart rhythm:        Vascular    Pain in calf, thigh, or hip brought on by ambulation:    Pain in feet at night that wakes you up from your sleep:     Blood clot in your veins:    Leg swelling:         Pulmonary    Oxygen at home:    Productive cough:     Wheezing:         Neurologic    Sudden weakness in arms or legs:     Sudden numbness in arms or legs:     Sudden onset of difficulty speaking or slurred speech:    Temporary loss of vision in one eye:     Problems with dizziness:         Gastrointestinal    Blood in stool:     Vomited blood:         Genitourinary    Burning when urinating:     Blood in urine:        Psychiatric    Major depression:         Hematologic    Bleeding problems:    Problems with blood clotting too easily:        Skin    Rashes or ulcers:        Constitutional    Fever or chills:      PHYSICAL EXAMINATION:  There were no vitals filed for this visit.  General:  WDWN in NAD; vital signs documented above Gait: Not observed HENT: WNL, normocephalic Pulmonary: normal non-labored  breathing , without Rales, rhonchi,  wheezing Cardiac: regular HR Abdomen: soft, NT, no masses Skin: without rashes Vascular Exam/Pulses:  Right Left  Radial 2+ (normal) 2+ (normal)  Ulnar    Femoral    Popliteal    DP 2+ (normal) 2+ (normal)       Extremities: without ischemic changes, without Gangrene , without cellulitis; without open wounds;  Musculoskeletal: no muscle wasting or atrophy  Right calf is 1/2 inch larger than the left in circumference 10 cm from the tibial tuberosity Neurologic: A&O X 3;  No focal weakness or paresthesias are detected Psychiatric:  The pt has Normal affect.   Non-Invasive Vascular Imaging:    IMPRESSION: 1. No evidence of acute DVT within the right lower extremity. 2. 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     ASSESSMENT/PLAN:: 51 y.o. male presenting with constellation of pain symptoms extending from the right groin into the pelvis.  Pain is constant, this does not wax and wane with activity level.  Lance Cohen is having a very difficult time urinating, with more pain associated.  I am unsure as to the etiology of his pain, however the symptoms are not consistent with chronic venous insufficiency, postphlebitic syndrome.  I would expect his leg  to worsen with activity due to increased venous hypertension.  Fortunately, with the location of his chronic thrombus in the femoral vein, there is not an operative treatment.  He would be best served with thigh-high compression stockings.  Lance Cohen and I discussed this.  He was not interested in purchasing stockings in our office today.  Onis can follow-up with me as needed.  Recommend lifelong anticoagulation as he has had multiple DVTs.   Lance John,  MD [facility] [phone]

## 2022-08-09 ENCOUNTER — Ambulatory Visit (INDEPENDENT_AMBULATORY_CARE_PROVIDER_SITE_OTHER): Payer: Medicaid Other | Admitting: Vascular Surgery

## 2022-08-09 ENCOUNTER — Encounter: Payer: Self-pay | Admitting: Vascular Surgery

## 2022-08-09 VITALS — BP 112/75 | HR 55 | Temp 97.9°F | Resp 20 | Ht 71.0 in | Wt 194.0 lb

## 2022-08-09 DIAGNOSIS — I82501 Chronic embolism and thrombosis of unspecified deep veins of right lower extremity: Secondary | ICD-10-CM | POA: Diagnosis not present

## 2022-08-19 ENCOUNTER — Encounter (INDEPENDENT_AMBULATORY_CARE_PROVIDER_SITE_OTHER): Payer: Self-pay

## 2022-09-12 ENCOUNTER — Other Ambulatory Visit: Payer: Self-pay

## 2022-09-12 ENCOUNTER — Encounter (HOSPITAL_COMMUNITY): Payer: Self-pay | Admitting: Emergency Medicine

## 2022-09-12 ENCOUNTER — Emergency Department (HOSPITAL_COMMUNITY)
Admission: EM | Admit: 2022-09-12 | Discharge: 2022-09-12 | Disposition: A | Discharge status: Home / Self Care | Payer: Medicaid Other | Attending: Emergency Medicine | Admitting: Emergency Medicine

## 2022-09-12 DIAGNOSIS — Z79899 Other long term (current) drug therapy: Secondary | ICD-10-CM | POA: Insufficient documentation

## 2022-09-12 DIAGNOSIS — I10 Essential (primary) hypertension: Secondary | ICD-10-CM | POA: Diagnosis not present

## 2022-09-12 DIAGNOSIS — Z7901 Long term (current) use of anticoagulants: Secondary | ICD-10-CM | POA: Diagnosis not present

## 2022-09-12 DIAGNOSIS — L509 Urticaria, unspecified: Secondary | ICD-10-CM | POA: Insufficient documentation

## 2022-09-12 MED ORDER — METHYLPREDNISOLONE SODIUM SUCC 125 MG IJ SOLR
125.0000 mg | Freq: Once | INTRAMUSCULAR | Status: AC
  Administered 2022-09-12: 125 mg via INTRAVENOUS
  Filled 2022-09-12: qty 2

## 2022-09-12 MED ORDER — FAMOTIDINE IN NACL 20-0.9 MG/50ML-% IV SOLN
20.0000 mg | Freq: Once | INTRAVENOUS | Status: AC
  Administered 2022-09-12: 20 mg via INTRAVENOUS
  Filled 2022-09-12: qty 50

## 2022-09-12 MED ORDER — PREDNISONE 10 MG PO TABS: 20.0000 mg | ORAL_TABLET | Freq: Two times a day (BID) | ORAL | 0 refills | Status: DC

## 2022-09-12 NOTE — ED Provider Notes (Signed)
[facility] EMERGENCY DEPARTMENT Provider Note   CSN: [id number] Arrival date & time: 09/12/22  0202     History  Chief Complaint  Patient presents with   Allergic Reaction    Lance Cohen is a 51 y.o. male.  Patient is a 51 year old male with past medical history of hypertension, prior DVT, arthritis, bipolar.  Patient presenting today for evaluation of rash and itching.  He states he woke up to take the Thanksgiving [place] out of the refrigerator when he developed hives and itching all over his body.  His throat began to feel scratchy, so 911 was called.  Patient received an EpiPen and a dose of Benadryl and route and is feeling somewhat better.  Patient tells me that this has happened multiple times in the past, however a definitive cause has never been determined.  He denies to me he has been in contact with any new medications, detergents, soaps, or other allergens.  The history is provided by the patient.       Home Medications Prior to Admission medications   Medication Sig Start Date End Date Taking? Authorizing Provider  acetaminophen (TYLENOL) 500 MG tablet Take 1 tablet (500 mg total) by mouth every 6 (six) hours as needed. 06/07/22   Varney Biles, MD  albuterol (VENTOLIN HFA) 108 (90 Base) MCG/ACT inhaler Inhale 1-2 puffs into the lungs every 6 (six) hours as needed for wheezing or shortness of breath. 01/17/21   Dorie Rank, MD  CAPLYTA 42 MG capsule Take 42 mg by mouth daily. 07/23/22   [provider]  cephALEXin (KEFLEX) 500 MG capsule Take 1 capsule (500 mg total) by mouth 3 (three) times daily. 06/07/22   Varney Biles, MD  ELIQUIS 5 MG TABS tablet Take 5 mg by mouth 2 (two) times daily. 11/29/21   [provider]  EPINEPHrine 0.3 mg/0.3 mL IJ SOAJ injection Inject 0.3 mg into the muscle as needed for anaphylaxis (For severe allergic reaction only with difficulty breathing, tongue swelling or throat swelling.). 04/08/21   Rancour, Annie Main, MD   LATUDA 20 MG TABS tablet SMARTSIG:1 Tablet(s) By Mouth Every Evening 11/30/21   [provider]  naproxen (NAPROSYN) 375 MG tablet Take 1 tablet (375 mg total) by mouth daily as needed. 06/07/22   Varney Biles, MD  oxyCODONE-acetaminophen (PERCOCET/ROXICET) 5-325 MG tablet Take 1 tablet by mouth every 6 (six) hours as needed for severe pain. Patient not taking: Reported on 08/09/2022 05/14/22   Milton Ferguson, MD  promethazine (PHENERGAN) 25 MG suppository Place 1 suppository (25 mg total) rectally every 6 (six) hours as needed for nausea or vomiting. Patient not taking: Reported on 08/09/2022 06/07/22   Varney Biles, MD  promethazine (PHENERGAN) 25 MG tablet Take 1 tablet (25 mg total) by mouth every 6 (six) hours as needed for nausea or vomiting. Patient not taking: Reported on 08/09/2022 06/07/22   Varney Biles, MD  QUEtiapine (SEROQUEL) 25 MG tablet Take 25 mg by mouth at bedtime. 08/30/21   [provider]  SUMAtriptan (IMITREX) 50 MG tablet Take 1 tablet (50 mg total) by mouth every 2 (two) hours as needed for migraine. May repeat in 2 hours if headache persists or recurs. 09/11/21   Alric Ran, MD  SYMBICORT 80-4.5 MCG/ACT inhaler Inhale into the lungs. 11/08/21   [provider]  tamsulosin (FLOMAX) 0.4 MG CAPS capsule Take 1 capsule (0.4 mg total) by mouth daily after supper. 07/22/22   Sherrell Puller, PA-C  Vitamin D, Ergocalciferol, (DRISDOL) 1.25 MG (  50000 UNIT) CAPS capsule Take 50,000 Units by mouth once a week. 11/08/21   [provider]      Allergies    Lovenox [enoxaparin sodium], Bee venom, Penicillins, Zofran [ondansetron], Other, and Sulfa antibiotics    Review of Systems   Review of Systems  All other systems reviewed and are negative.   Physical Exam Updated Vital Signs BP 133/80   Pulse 62   Temp 97.7 F (36.5 C) (Oral)   Resp 18   Ht '5\' 11"'$  (1.803 m)   Wt 88.9 kg   SpO2 100%   BMI 27.34 kg/m  Physical Exam Vitals  and nursing note reviewed.  Constitutional:      General: He is not in acute distress.    Appearance: He is well-developed. He is not diaphoretic.  HENT:     Head: Normocephalic and atraumatic.     Mouth/Throat:     Mouth: Mucous membranes are moist.     Pharynx: No oropharyngeal exudate or posterior oropharyngeal erythema.  Cardiovascular:     Rate and Rhythm: Normal rate and regular rhythm.     Heart sounds: No murmur heard.    No friction rub.  Pulmonary:     Effort: Pulmonary effort is normal. No respiratory distress.     Breath sounds: Normal breath sounds. No stridor. No wheezing or rales.  Abdominal:     General: Bowel sounds are normal. There is no distension.     Palpations: Abdomen is soft.     Tenderness: There is no abdominal tenderness.  Musculoskeletal:        General: Normal range of motion.     Cervical back: Normal range of motion and neck supple.  Skin:    General: Skin is warm and dry.     Findings: Rash present.     Comments: There is an urticarial rash noted to the upper and lower extremities and torso.  Neurological:     Mental Status: He is alert and oriented to person, place, and time.     Coordination: Coordination normal.     ED Results / Procedures / Treatments   Labs (all labs ordered are listed, but only abnormal results are displayed) Labs Reviewed - No data to display  EKG None  Radiology No results found.  Procedures Procedures    Medications Ordered in ED Medications  famotidine (PEPCID) IVPB 20 mg premix (has no administration in time range)  methylPREDNISolone sodium succinate (SOLU-MEDROL) 125 mg/2 mL injection 125 mg (has no administration in time range)    ED Course/ Medical Decision Making/ A&P  Patient is a 51 year old male presenting with rash and itching and possible allergic reaction.  He arrives here with urticarial lesions to his arms, legs, and torso, but with no known trigger.  He is afebrile and vital signs are  otherwise stable.  There is no stridor or airway involvement.  Patient given epinephrine and Benadryl prior to arrival.  He has received Pepcid and Solu-Medrol here in the ER.  He has been observed for over 2 hours and his symptoms have nearly resolved.  He is now resting comfortably and I feel can safely be discharged.  Final Clinical Impression(s) / ED Diagnoses Final diagnoses:  None    Rx / DC Orders ED Discharge Orders     None         Veryl Speak, MD 09/12/22 [phone]

## 2022-09-12 NOTE — Discharge Instructions (Signed)
Begin taking prednisone as prescribed.  Begin taking Benadryl 25 mg every 6 hours for the next 3 days.  Return to the emergency department if your symptoms significantly worsen or change.

## 2022-09-12 NOTE — ED Triage Notes (Signed)
Pt called ems due to itching, tongue swelling and hives. Ems gave pt epi 0.'3mg'$  and '50mg'$  of benadryl.

## 2022-09-23 ENCOUNTER — Encounter: Payer: Self-pay | Admitting: Orthopedic Surgery

## 2022-09-23 ENCOUNTER — Ambulatory Visit (INDEPENDENT_AMBULATORY_CARE_PROVIDER_SITE_OTHER): Payer: Medicaid Other | Admitting: Orthopedic Surgery

## 2022-09-23 ENCOUNTER — Ambulatory Visit (INDEPENDENT_AMBULATORY_CARE_PROVIDER_SITE_OTHER): Payer: Medicaid Other

## 2022-09-23 VITALS — BP 91/78 | HR 75

## 2022-09-23 DIAGNOSIS — M79601 Pain in right arm: Secondary | ICD-10-CM | POA: Diagnosis not present

## 2022-09-23 DIAGNOSIS — Z9889 Other specified postprocedural states: Secondary | ICD-10-CM

## 2022-09-23 DIAGNOSIS — Z8781 Personal history of (healed) traumatic fracture: Secondary | ICD-10-CM

## 2022-09-23 NOTE — Progress Notes (Signed)
Chief Complaint  Patient presents with   Wrist Pain    RT wrist/hand pain. HX of ORIF R FA BBFX in Feb 8358    51 year old male had ORIF of his right forearm back in February 22 comes in with numbness in his right hand and pain in his wrist.  He denies any elbow pain or elbow tingling he does complain of gout in his right shoulder with pain there  He was evaluated for carpal tunnel in the past told he did not have it but that was prior to the forearm surgery  The incisions over the right forearm have healed nicely.  He has decreased sensation in the long ring and small finger.  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

## 2022-09-23 NOTE — Addendum Note (Signed)
Addended by: Moreen Fowler R on: 09/23/2022 03:24 PM   Modules accepted: Orders

## 2022-10-08 ENCOUNTER — Encounter: Payer: Medicaid Other | Admitting: Physical Medicine and Rehabilitation

## 2022-10-08 ENCOUNTER — Telehealth: Payer: Self-pay | Admitting: Physical Medicine and Rehabilitation

## 2022-10-08 NOTE — Telephone Encounter (Signed)
Pt's wife Audrea Muscat called to reschedule appt with Dr Ernestina Patches. Please call to reschedule at [phone].

## 2022-10-08 NOTE — Telephone Encounter (Signed)
Spoke with patient's wife and rescheduled for 10/25/22

## 2022-10-25 ENCOUNTER — Ambulatory Visit (INDEPENDENT_AMBULATORY_CARE_PROVIDER_SITE_OTHER): Payer: Medicaid Other | Admitting: Physical Medicine and Rehabilitation

## 2022-10-25 DIAGNOSIS — R202 Paresthesia of skin: Secondary | ICD-10-CM

## 2022-10-29 NOTE — Progress Notes (Signed)
Lance Cohen - 52 y.o. male MRN [id number]  Date of birth: 1971/07/04  Office Visit Note: Visit Date: 10/25/2022 PCP: [name], NP Referred by: Carole Civil, MD  Subjective: Chief Complaint  Patient presents with   Left Hand - Numbness, Pain    digits 3-5 numbness & tingling   Right Hand - Numbness, Pain    Worse than left, digits 3-5 numbness & tingling   HPI: Lance Cohen is a 52 y.o. male who comes in today at the request of Dr. Arther Abbott for evaluation and management of chronic, worsening and severe pain, numbness and tingling in the Bilateral upper extremities.  Patient is Right hand dominant.  He is status post ORIF of his right forearm in February 22.  He now endorses numbness in his right hand and pain in his wrist.  He denies any elbow pain or elbow tingling he does complain of gout in his right shoulder.  He was told in the past that he had no carpal tunnel syndrome but he does have no prior electrodiagnostic studies.  Dr. Aline Brochure noted really symptoms on the right but patient is having the same symptoms on both right and left.  He reports left is sometimes worse than right.  No history of diabetes or frank radicular symptoms.       ROS Otherwise per HPI.  Assessment & Plan: Visit Diagnoses:    ICD-10-CM   1. 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   There is no significant electrodiagnostic evidence of any other focal nerve entrapment, brachial plexopathy or cervical radiculopathy.   Recommendations: 1.  Follow-up with referring physician. 2.  Continue current management of symptoms. 3.  Continue use of resting splint at night-time and as needed during the day. 4.  Suggest surgical  evaluation.  Meds & Orders: No orders of the defined types were placed in this encounter.   Orders Placed This Encounter  Procedures   NCV with EMG (electromyography)    Follow-up: Return in about 2 weeks (around 11/08/2022) for Arther Abbott, MD.   Procedures: No procedures performed  EMG & NCV Findings: Evaluation of the left median motor and the right median motor nerves showed prolonged distal onset latency (L4.5, R4.4 ms) and decreased conduction velocity (Elbow-Wrist, L48, R48 m/s).  The right ulnar motor nerve showed decreased conduction velocity (B Elbow-Wrist, 50 m/s).  The left median (across palm) sensory and the right median (across palm) sensory nerves showed no response (Palm) and prolonged distal peak latency (L3.9, R3.8 ms).  All remaining nerves (as indicated in the following tables) were within normal limits.  All left vs. right side differences were within normal limits.    All examined muscles (as indicated in the following table) showed no evidence of electrical instability.    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   There is no significant electrodiagnostic evidence of any other focal nerve entrapment, brachial plexopathy or cervical radiculopathy.   Recommendations: 1.  Follow-up with referring physician. 2.  Continue current management of symptoms. 3.  Continue use of resting splint at night-time and as needed during the day. 4.  Suggest  surgical evaluation.  ___________________________ Laurence Spates FAAPMR Board Certified, American Board of Physical Medicine and Rehabilitation    Nerve Conduction Studies Anti Sensory Summary Table   Stim Site NR Peak (ms) Norm Peak (ms) P-T Amp (V) Norm P-T Amp Site1 Site2 Delta-P (ms) Dist (cm) Vel (m/s) Norm Vel (m/s)  Left Median Acr Palm Anti Sensory  (2nd Digit)  32.2C  Wrist    *3.9 <3.6 33.4 >10 Wrist Palm  0.0    Palm *NR  <2.0          Right Median Acr Palm Anti Sensory (2nd Digit)  31.6C  Wrist    *3.8 <3.6 21.9 >10 Wrist Palm  0.0    Palm *NR  <2.0          Right Radial Anti Sensory (Base 1st Digit)  31.5C  Wrist    2.6 <3.1 24.4  Wrist Base 1st Digit 2.6 0.0    Right Ulnar Anti Sensory (5th Digit)  31.7C  Wrist    3.0 <3.7 20.0 >15.0 Wrist 5th Digit 3.0 14.0 47 >38   Motor Summary Table   Stim Site NR Onset (ms) Norm Onset (ms) O-P Amp (mV) Norm O-P Amp Site1 Site2 Delta-0 (ms) Dist (cm) Vel (m/s) Norm Vel (m/s)  Left Median Motor (Abd Poll Brev)  32.3C  Wrist    *4.5 <4.2 6.0 >5 Elbow Wrist 4.6 22.0 *48 >50  Elbow    9.1  6.3         Right Median Motor (Abd Poll Brev)  31.3C  Wrist    *4.4 <4.2 5.8 >5 Elbow Wrist 4.5 21.5 *48 >50  Elbow    8.9  5.5         Right Ulnar Motor (Abd Dig Min)  31.5C  Wrist    2.8 <4.2 10.4 >3 B Elbow Wrist 4.2 21.0 *50 >53  B Elbow    7.0  11.1  A Elbow B Elbow 1.3 10.0 77 >53  A Elbow    8.3  11.0          EMG   Side Muscle Nerve Root Ins Act Fibs Psw Amp Dur Poly Recrt Int Fraser Din Comment  Right Abd Poll Brev Median C8-T1 Nml Nml Nml Nml Nml 0 Nml Nml   Right 1stDorInt Ulnar C8-T1 Nml Nml Nml Nml Nml 0 Nml Nml   Right PronatorTeres Median C6-7 Nml Nml Nml Nml Nml 0 Nml Nml   Right Biceps Musculocut C5-6 Nml Nml Nml Nml Nml 0 Nml Nml   Right Deltoid Axillary C5-6 Nml Nml Nml Nml Nml 0 Nml Nml     Nerve Conduction Studies Anti Sensory Left/Right Comparison   Stim Site L Lat (ms) R Lat (ms) L-R Lat (ms) L Amp (V) R Amp (V) L-R Amp (%) Site1 Site2 L Vel (m/s) R Vel (m/s) L-R Vel (m/s)  Median Acr Palm Anti Sensory (2nd Digit)  32.2C  Wrist *3.9 *3.8 0.1 33.4 21.9 34.4 Wrist Palm     Palm             Radial Anti Sensory (Base 1st Digit)  31.5C  Wrist  2.6   24.4  Wrist Base 1st Digit     Ulnar Anti Sensory (5th Digit)  31.7C  Wrist  3.0   20.0  Wrist 5th Digit  47    Motor  Left/Right Comparison   Stim Site L Lat (ms) R Lat (ms) L-R Lat (ms) L Amp (mV) R Amp (mV) L-R Amp (%) Site1 Site2  L Vel (m/s) R Vel (m/s) L-R Vel (m/s)  Median Motor (Abd Poll Brev)  32.3C  Wrist *4.5 *4.4 0.1 6.0 5.8 3.3 Elbow Wrist *48 *48 0  Elbow 9.1 8.9 0.2 6.3 5.5 12.7       Ulnar Motor (Abd Dig Min)  31.5C  Wrist  2.8   10.4  B Elbow Wrist  *50   B Elbow  7.0   11.1  A Elbow B Elbow  77   A Elbow  8.3   11.0           Waveforms:                 Clinical History: CT CERVICAL SPINE WITHOUT CONTRAST   TECHNIQUE: Multidetector CT imaging of the cervical spine was performed without intravenous contrast. Multiplanar CT image reconstructions were also generated.   RADIATION DOSE REDUCTION: This exam was performed according to the departmental dose-optimization program which includes automated exposure control, adjustment of the mA and/or kV according to patient size and/or use of iterative reconstruction technique.   COMPARISON:  04/11/2020   FINDINGS: Alignment: Alignment is grossly anatomic.   Skull base and vertebrae: No acute fracture. No primary bone lesion or focal pathologic process.   Soft tissues and spinal canal: No prevertebral fluid or swelling. No visible canal hematoma.   Disc levels: Mild multilevel spondylosis. Prominent uncovertebral hypertrophy and disc osteophyte complex at C3-4 and C4-5 result in symmetrical neural foraminal encroachment.   Upper chest: Airway is widely patent. Visualized portions of the lung apices are clear.   Other: Reconstructed images demonstrate no additional findings.   IMPRESSION: 1. No acute cervical spine fracture. 2. Mild cervical spondylosis greatest at C3-4 and C4-5.     Electronically Signed   By: Randa Ngo M.D.   On: 07/22/2022 15:16   He reports that he has been smoking cigarettes. He has been smoking an average of .5 packs per day. He has never used smokeless tobacco. No results for input(s):  "HGBA1C", "LABURIC" in the last 8760 hours.  Objective:  VS:  HT:    WT:   BMI:     BP:   HR: bpm  TEMP: ( )  RESP:  Physical Exam Musculoskeletal:        General: No tenderness.     Comments: Inspection reveals no atrophy of the bilateral APB or FDI or hand intrinsics. There is no swelling, color changes, allodynia or dystrophic changes. There is 5 out of 5 strength in the bilateral wrist extension, finger abduction and long finger flexion. There is intact sensation to light touch in all dermatomal and peripheral nerve distributions. There is a negative Tinel's test at the bilateral wrist and elbow. There is a positive Phalen's test bilaterally. There is a negative Hoffmann's test bilaterally.  Skin:    General: Skin is warm and dry.     Findings: No erythema or rash.  Neurological:     General: No focal deficit present.     Mental Status: He is alert and oriented to person, place, and time.     Sensory: No sensory deficit.     Motor: No weakness or abnormal muscle tone.     Coordination: Coordination normal.     Gait: Gait normal.  Psychiatric:        Mood and Affect: Mood normal.        Behavior: Behavior normal.        Thought Content: Thought content normal.     Ortho  Exam  Imaging: No results found.  Past Medical/Family/Surgical/Social History: Medications & Allergies reviewed per EMR, new medications updated. Patient Active Problem List   Diagnosis Date Noted   Syncope 07/04/2021   Bradycardia 06/16/2021   Closed fracture of right distal radius and ulna 12/21/2020   Closed fracture of right forearm    Daily headache 11/27/2020   Dizzy spells 11/27/2020   Seizure ([place]) 11/27/2020   Myofascial pain dysfunction syndrome 11/22/2020   Cocaine abuse ([place]) 11/18/2020   Foreign body (FB) in soft tissue 09/08/2020   Chronic low back pain 05/11/2020   Shoulder pain 05/11/2020   Headache 05/11/2020   Cervical radiculopathy 02/18/2020   Impingement syndrome of right  shoulder 01/18/2020   Rectal pain 06/29/2019   GERD (gastroesophageal reflux disease) 06/29/2019   Rectal bleeding 02/22/2019   Priapism 02/08/2019   Chest pain 10/19/2017   Asthma 10/19/2017   Hypertension 10/19/2017   Arthralgia of right knee 10/19/2017   Pain in limb 12/27/2016   History of pulmonary embolus (PE) 10/23/2016   Nausea and vomiting 10/23/2016   S/P IVC filter 10/23/2016   Tobacco abuse counseling 10/23/2016   Chronic deep vein thrombosis (DVT) ([place]) 10/18/2016   Cellulitis of foot 06/24/2011   Bipolar 1 disorder ([place]) 06/24/2011   Tobacco abuse 06/24/2011   Polycythemia 06/24/2011   Tinea pedis 06/24/2011   CARPAL TUNNEL SYNDROME, RIGHT 06/21/2010   CONTUSION, LEFT FOOT 06/05/2010   FOREIGN BODY, FOOT 04/17/2010   Past Medical History:  Diagnosis Date   Anxiety    Arthritis    Asthma    Bipolar 1 disorder ([place])    Chronic knee pain    Depression    Dilated aortic root (H[facility])    DVT (deep venous thrombosis) ([place])    Hypertension    Insomnia    Sinus bradycardia    Sleep apnea    Spinal stenosis    Suicide attempt ([place])    2010 Intentional overdose attempt with Depakote.   Family History  Problem Relation Age of Onset   Cancer Mother    Heart attack Father    Colon cancer Neg Hx    Past Surgical History:  Procedure Laterality Date   COLONOSCOPY WITH PROPOFOL N/A 03/18/2019   Procedure: COLONOSCOPY WITH PROPOFOL;  Surgeon: [name], MD;  Location: [facility];  Service: Endoscopy;  Laterality: N/A;  2:30pm   FOOT SURGERY     had peice of wire removed   IVC FILTER REMOVAL N/A 02/24/2017   Procedure: IVC Filter Removal;  Surgeon: [name], MD;  Location: [facility];  Service: Cardiovascular;  Laterality: N/A;   ORIF RADIAL FRACTURE Right 12/05/2020   Procedure: OPEN REDUCTION INTERNAL FIXATION (ORIF) RADIAL FRACTURE and ULNA FRACTURE;  Surgeon: Carole Civil, MD;  Location: [facility];  Service: Orthopedics;  Laterality: Right;    PERIPHERAL VASCULAR CATHETERIZATION Right 10/22/2016   Procedure: Thrombectomy, thrombolyisis;  Surgeon: [name], MD;  Location: [facility];  Service: Cardiovascular;  Laterality: Right;   PERIPHERAL VASCULAR CATHETERIZATION N/A 10/22/2016   Procedure: IVC Filter Insertion;  Surgeon: [name], MD;  Location: [facility];  Service: Cardiovascular;  Laterality: N/A;   POLYPECTOMY  03/18/2019   Procedure: POLYPECTOMY;  Surgeon: [name], MD;  Location: [facility];  Service: Endoscopy;;   Social History   Occupational History   Not on file  Tobacco Use   Smoking status: Every Day    Packs/day: 0.50  Types: Cigarettes   Smokeless tobacco: Never  Vaping Use   Vaping Use: Never used  Substance and Sexual Activity   Alcohol use: Yes    Comment: seldom   Drug use: Yes    Types: Marijuana    Comment: smokes for pain   Sexual activity: Yes

## 2022-10-29 NOTE — Procedures (Signed)
EMG & NCV Findings: Evaluation of the left median motor and the right median motor nerves showed prolonged distal onset latency (L4.5, R4.4 ms) and decreased conduction velocity (Elbow-Wrist, L48, R48 m/s).  The right ulnar motor nerve showed decreased conduction velocity (B Elbow-Wrist, 50 m/s).  The left median (across palm) sensory and the right median (across palm) sensory nerves showed no response (Palm) and prolonged distal peak latency (L3.9, R3.8 ms).  All remaining nerves (as indicated in the following tables) were within normal limits.  All left vs. right side differences were within normal limits.    All examined muscles (as indicated in the following table) showed no evidence of electrical instability.    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   There is no significant electrodiagnostic evidence of any other focal nerve entrapment, brachial plexopathy or cervical radiculopathy.   Recommendations: 1.  Follow-up with referring physician. 2.  Continue current management of symptoms. 3.  Continue use of resting splint at night-time and as needed during the day. 4.  Suggest surgical evaluation.  ___________________________ Laurence Spates FAAPMR Board Certified, American Board of Physical Medicine and Rehabilitation    Nerve Conduction Studies Anti Sensory Summary Table   Stim Site NR Peak (ms) Norm Peak (ms) P-T Amp (V) Norm P-T Amp Site1 Site2 Delta-P (ms) Dist (cm) Vel (m/s) Norm Vel (m/s)  Left Median Acr Palm Anti Sensory (2nd Digit)  32.2C  Wrist    *3.9 <3.6 33.4 >10 Wrist Palm  0.0    Palm *NR  <2.0          Right Median Acr Palm Anti Sensory (2nd Digit)  31.6C  Wrist    *3.8 <3.6 21.9 >10 Wrist Palm  0.0    Palm *NR  <2.0          Right Radial Anti Sensory (Base 1st Digit)  31.5C  Wrist    2.6 <3.1 24.4   Wrist Base 1st Digit 2.6 0.0    Right Ulnar Anti Sensory (5th Digit)  31.7C  Wrist    3.0 <3.7 20.0 >15.0 Wrist 5th Digit 3.0 14.0 47 >38   Motor Summary Table   Stim Site NR Onset (ms) Norm Onset (ms) O-P Amp (mV) Norm O-P Amp Site1 Site2 Delta-0 (ms) Dist (cm) Vel (m/s) Norm Vel (m/s)  Left Median Motor (Abd Poll Brev)  32.3C  Wrist    *4.5 <4.2 6.0 >5 Elbow Wrist 4.6 22.0 *48 >50  Elbow    9.1  6.3         Right Median Motor (Abd Poll Brev)  31.3C  Wrist    *4.4 <4.2 5.8 >5 Elbow Wrist 4.5 21.5 *48 >50  Elbow    8.9  5.5         Right Ulnar Motor (Abd Dig Min)  31.5C  Wrist    2.8 <4.2 10.4 >3 B Elbow Wrist 4.2 21.0 *50 >53  B Elbow    7.0  11.1  A Elbow B Elbow 1.3 10.0 77 >53  A Elbow    8.3  11.0          EMG   Side Muscle Nerve Root Ins Act Fibs Psw Amp Dur Poly Recrt Int Fraser Din Comment  Right Abd Poll Brev Median C8-T1 Nml Nml Nml Nml Nml 0 Nml Nml   Right 1stDorInt Ulnar C8-T1 Nml Nml Nml Nml Nml 0  Nml Nml   Right PronatorTeres Median C6-7 Nml Nml Nml Nml Nml 0 Nml Nml   Right Biceps Musculocut C5-6 Nml Nml Nml Nml Nml 0 Nml Nml   Right Deltoid Axillary C5-6 Nml Nml Nml Nml Nml 0 Nml Nml     Nerve Conduction Studies Anti Sensory Left/Right Comparison   Stim Site L Lat (ms) R Lat (ms) L-R Lat (ms) L Amp (V) R Amp (V) L-R Amp (%) Site1 Site2 L Vel (m/s) R Vel (m/s) L-R Vel (m/s)  Median Acr Palm Anti Sensory (2nd Digit)  32.2C  Wrist *3.9 *3.8 0.1 33.4 21.9 34.4 Wrist Palm     Palm             Radial Anti Sensory (Base 1st Digit)  31.5C  Wrist  2.6   24.4  Wrist Base 1st Digit     Ulnar Anti Sensory (5th Digit)  31.7C  Wrist  3.0   20.0  Wrist 5th Digit  47    Motor Left/Right Comparison   Stim Site L Lat (ms) R Lat (ms) L-R Lat (ms) L Amp (mV) R Amp (mV) L-R Amp (%) Site1 Site2 L Vel (m/s) R Vel (m/s) L-R Vel (m/s)  Median Motor (Abd Poll Brev)  32.3C  Wrist *4.5 *4.4 0.1 6.0 5.8 3.3 Elbow Wrist *48 *48 0  Elbow 9.1 8.9 0.2 6.3 5.5 12.7       Ulnar Motor  (Abd Dig Min)  31.5C  Wrist  2.8   10.4  B Elbow Wrist  *50   B Elbow  7.0   11.1  A Elbow B Elbow  77   A Elbow  8.3   11.0           Waveforms:

## 2022-11-11 ENCOUNTER — Ambulatory Visit (INDEPENDENT_AMBULATORY_CARE_PROVIDER_SITE_OTHER): Payer: Medicaid Other | Admitting: Orthopedic Surgery

## 2022-11-11 ENCOUNTER — Encounter: Payer: Self-pay | Admitting: Orthopedic Surgery

## 2022-11-11 DIAGNOSIS — G5601 Carpal tunnel syndrome, right upper limb: Secondary | ICD-10-CM | POA: Diagnosis not present

## 2022-11-11 DIAGNOSIS — Z8781 Personal history of (healed) traumatic fracture: Secondary | ICD-10-CM | POA: Diagnosis not present

## 2022-11-11 DIAGNOSIS — M79601 Pain in right arm: Secondary | ICD-10-CM

## 2022-11-11 DIAGNOSIS — Z9889 Other specified postprocedural states: Secondary | ICD-10-CM

## 2022-11-11 DIAGNOSIS — Z01818 Encounter for other preprocedural examination: Secondary | ICD-10-CM

## 2022-11-11 NOTE — Patient Instructions (Addendum)
Your surgery will be at [facility] by Dr Harrison  The hospital will contact you with a preoperative appointment to discuss Anesthesia.  Please arrive on time or 15 minutes early for the preoperative appointment, they have a very tight schedule if you are late or do not come in your surgery will be cancelled.  The phone number is [phone]. Please bring your medications with you for the appointment. They will tell you the arrival time and medication instructions when you have your preoperative evaluation. Do not wear nail polish the day of your surgery and if you take Phentermine you need to stop this medication ONE WEEK prior to your surgery. If you take Invokana, Farxiga, Jardiance, or Steglatro) - Hold 72 hours before the procedure.  If you take Ozempic,  Bydureon or Trulicity do not take for 8 days before your surgery. If you take Victoza, Rybelsis, Saxenda or Adlyxi stop 24 hours before the procedure.  Please arrive at the hospital 2 hours before procedure if scheduled at 9:30 or later in the day or at the time the nurse tells you at your preoperative visit.   If you have my chart do not use the time given in my chart use the time given to you by the nurse during your preoperative visit.   Your surgery  time may change. Please be available for phone calls the day of your surgery and the day before. The Short Stay department may need to discuss changes about your surgery time. Not reaching the you could lead to procedure delays and possible cancellation.  You must have a ride home and someone to stay with you for 24 to 48 hours. The person taking you home will receive and sign for the your discharge instructions.  Please be prepared to give your support person's name and telephone number to [facility]. Dr Harrison will need that name and phone number post procedure.   

## 2022-11-11 NOTE — Addendum Note (Signed)
Addended byCandice Camp on: 11/11/2022 03:14 PM   Modules accepted: Orders

## 2022-11-11 NOTE — Progress Notes (Signed)
FOLLOW-UP OFFICE VISIT   Encounter Diagnoses  Name Primary?   Pain of right upper extremity Yes   S/P ORIF (open reduction internal fixation) fracture     52 year old male complained of pain paresthesias right upper extremity after ORIF of the radius and ulna  After discussing with the patient we sent him for nerve conduction study  + EXAM FINDINGS: Paresthesias decreased sensation right hand median nerve distribution  ASSESSMENT AND PLAN  EMG & NCV Findings: Evaluation of the left median motor and the right median motor nerves showed prolonged distal onset latency (L4.5, R4.4 ms) and decreased conduction velocity (Elbow-Wrist, L48, R48 m/s).  The right ulnar motor nerve showed decreased conduction velocity (B Elbow-Wrist, 50 m/s).  The left median (across palm) sensory and the right median (across palm) sensory nerves showed no response (Palm) and prolonged distal peak latency (L3.9, R3.8 ms).  All remaining nerves (as indicated in the following tables) were within normal limits.  All left vs. right side differences were within normal limits.     All examined muscles (as indicated in the following table) showed no evidence of electrical instability.     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    There is no significant electrodiagnostic evidence of any other focal nerve entrapment, brachial plexopathy or cervical radiculopathy.   I reviewed the nerve conduction study with Mr. Twilley.  He is already worn braces he is no longer interested in that treatment  He agrees to proceed with surgical intervention  The procedure has been fully reviewed with the patient; The risks and benefits of surgery have been discussed and explained and understood. Alternative treatment has also been reviewed, questions were encouraged and answered.  The postoperative plan is also been reviewed.   Open right carpal tunnel release

## 2022-11-12 NOTE — Addendum Note (Signed)
Addended byCandice Camp on: 11/12/2022 10:21 AM   Modules accepted: Orders

## 2022-11-14 ENCOUNTER — Telehealth: Payer: Self-pay | Admitting: Orthopedic Surgery

## 2022-11-14 NOTE — Telephone Encounter (Signed)
Ciera a precert nurse w/[employer] lvm for Abby to call her [phone]

## 2022-11-15 NOTE — Telephone Encounter (Signed)
Spoke with Lance Cohen at [employer]. He can'Cohen find an open/closed/pending case for this member. Had him double check the surgical cpt code to see if it needed a prior auth. No prior auth required. Reference number for call documented in referral for [facility]

## 2022-11-21 NOTE — Patient Instructions (Signed)
Lance Cohen  11/21/2022     '@[email or web address]'$ @   Your procedure is scheduled on  11/26/2022.   Report to [facility] at  [place]   Call this number if you have problems the morning of surgery:  [phone]  If you experience any cold or flu symptoms such as cough, fever, chills, shortness of breath, etc. between now and your scheduled surgery, please notify us at the above number.   Remember:  Do not eat or drink after midnight.      Take these medicines the morning of surgery with A SIP OF WATER                                                None    Do not wear jewelry, make-up or nail polish.  Do not wear lotions, powders, or perfumes, or deodorant.  Do not shave 48 hours prior to surgery.  Men may shave face and neck.  Do not bring valuables to the hospital.  [facility] is not responsible for any belongings or valuables.  Contacts, dentures or bridgework may not be worn into surgery.  Leave your suitcase in the car.  After surgery it may be brought to your room.  For patients admitted to the hospital, discharge time will be determined by your treatment team.  Patients discharged the day of surgery will not be allowed to drive home and must have someone with them for 24 hours.    Special instructions:   DO NOT smoke tobacco or vape for 24 hours before your procedure.  Please read over the following fact sheets that you were given. Coughing and Deep Breathing, Surgical Site Infection Prevention, Anesthesia Post-op Instructions, and Care and Recovery After Surgery      Open Carpal Tunnel Release, Care After This sheet gives you information about how to care for yourself after your procedure. Your health care provider may also give you more specific instructions. If you have problems or questions, contact your health care provider. What can I expect after the procedure? After the procedure, it is common to have: Pain. Swelling. Wrist  stiffness. Bruising. Follow these instructions at home: Medicines Take over-the-counter and prescription medicines only as told by your health care provider. Ask your health care provider if the medicine prescribed to you: Requires you to avoid driving or using machinery. Can cause constipation. You may need to take these actions to prevent or treat constipation: Drink enough fluid to keep your urine pale yellow. Take over-the-counter or prescription medicines. Eat foods that are high in fiber, such as beans, whole grains, and fresh fruits and vegetables. Limit foods that are high in fat and processed sugars, such as fried or sweet foods. Bathing Do not take baths, swim, or use a hot tub until your health care provider approves. Ask your health care provider if you may take showers. Keep your bandage (dressing) dry until your health care provider says it can be removed. Cover it with a watertight covering when you take a bath or a shower. If you have a splint or brace: Wear the splint or brace as told by your health care provider. You may need to wear it for 2-3 weeks. Remove it only as told by your health care provider. Loosen the splint or brace if your  fingers tingle, become numb, or turn cold and blue. Keep the splint or brace clean. If the splint or brace is not waterproof: Do not let it get wet. Cover it with a watertight covering when you take a bath or a shower. Incision care  After the compression bandage has been removed, follow instructions from your health care provider about how to take care of your incision. Make sure you: Wash your hands with soap and water for at least 20 seconds before and after you change your bandage (dressing). If soap and water are not available, use hand sanitizer. Change your dressing as told by your health care provider. Leave stitches (sutures), skin glue, or adhesive strips in place. These skin closures may need to stay in place for 2 weeks or  longer. If adhesive strip edges start to loosen and curl up, you may trim the loose edges. Do not remove adhesive strips completely unless your health care provider tells you to do that. Check your incision area every day for signs of infection. Check for: Redness. More swelling or pain. Fluid or blood. Warmth. Pus or a bad smell. Managing pain, stiffness, and swelling  If directed, put ice on the affected area. If you have a removable splint or brace, remove it as told by your health care provider. Put ice in a plastic bag. Place a towel between your skin and the bag. Leave the ice on for 20 minutes, 2-3 times a day. Do not fall asleep with ice pack on your skin. Remove the ice if your skin turns bright red. This is very important. If you cannot feel pain, heat, or cold, you have a greater risk of damage to the area. Move your fingers often to avoid stiffness and to lessen swelling. Raise (elevate) your wrist above the level of your heart while you are sitting or lying down. Activity Do not drive until your health care provider approves. Use your hand carefully. Do not do activities that cause pain. You should be able to do light activities with your hand. Do not lift with your affected hand until your health care provider approves. Avoid pulling and pushing with the injured arm. Return to your normal activities as told by your health care provider. Ask your health care provider what activities are safe for you. If physical therapy was prescribed, do exercises as told by your therapist. Physical therapy can help you heal faster and regain movement. General instructions Do not use any products that contain nicotine or tobacco, such as cigarettes and e-cigarettes. These can delay incision healing after surgery. If you need help quitting, ask your health care provider. Keep all follow-up visits. This is important. These include visits for physical therapy. Contact a health care provider  if: You have redness around your incision. You have more swelling or pain. You have fluid or blood coming from your incision. Your incision feels warm to the touch. You have pus or a bad smell coming from your incision. You have a fever or chills. You have pain that does not get better with medicine. Your carpal tunnel symptoms do not go away after 2 months. Your carpal tunnel symptoms go away and then come back. Get help right away if: You have pain or numbness that is getting worse. Your fingers or fingertips become very pale or bluish in color. You are not able to move your fingers. Summary It is common to have wrist stiffness and bruising after a carpal tunnel release. Icing and raising (elevating)  your wrist may help to lessen swelling and pain. Call your health care provider if you have a fever or notice any signs of infection in your incision area. This information is not intended to replace advice given to you by your health care provider. Make sure you discuss any questions you have with your health care provider. Document Revised: 02/10/2020 Document Reviewed: 02/10/2020 Elsevier Patient Education  [place] After The following information offers guidance on how to care for yourself after your procedure. Your health care provider may also give you more specific instructions. If you have problems or questions, contact your health care provider. What can I expect after the procedure? After the procedure, it is common to have: Tiredness. Little or no memory about what happened during or after the procedure. Impaired judgment when it comes to making decisions. Nausea or vomiting. Some trouble with balance. Follow these instructions at home: For the time period you were told by your health care provider:  Rest. Do not participate in activities where you could fall or become injured. Do not drive or use machinery. Do not drink  alcohol. Do not take sleeping pills or medicines that cause drowsiness. Do not make important decisions or sign legal documents. Do not take care of children on your own. Medicines Take over-the-counter and prescription medicines only as told by your health care provider. If you were prescribed antibiotics, take them as told by your health care provider. Do not stop using the antibiotic even if you start to feel better. Eating and drinking Follow instructions from your health care provider about what you may eat and drink. Drink enough fluid to keep your urine pale yellow. If you vomit: Drink clear fluids slowly and in small amounts as you are able. Clear fluids include water, ice chips, low-calorie sports drinks, and fruit juice that has water added to it (diluted fruit juice). Eat light and bland foods in small amounts as you are able. These foods include bananas, applesauce, rice, lean meats, toast, and crackers. General instructions  Have a responsible adult stay with you for the time you are told. It is important to have someone help care for you until you are awake and alert. If you have sleep apnea, surgery and some medicines can increase your risk for breathing problems. Follow instructions from your health care provider about wearing your sleep device: When you are sleeping. This includes during daytime naps. While taking prescription pain medicines, sleeping medicines, or medicines that make you drowsy. Do not use any products that contain nicotine or tobacco. These products include cigarettes, chewing tobacco, and vaping devices, such as e-cigarettes. If you need help quitting, ask your health care provider. Contact a health care provider if: You feel nauseous or vomit every time you eat or drink. You feel light-headed. You are still sleepy or having trouble with balance after 24 hours. You get a rash. You have a fever. You have redness or swelling around the IV site. Get help  right away if: You have trouble breathing. You have new confusion after you get home. These symptoms may be an emergency. Get help right away. Call 911. Do not wait to see if the symptoms will go away. Do not drive yourself to the hospital. This information is not intended to replace advice given to you by your health care provider. Make sure you discuss any questions you have with your health care provider. Document Revised: 03/04/2022 Document Reviewed: 03/04/2022 Elsevier Patient Education  [place]. How to Use Chlorhexidine Before Surgery Chlorhexidine gluconate (CHG) is a germ-killing (antiseptic) solution that is used to clean the skin. It can get rid of the bacteria that normally live on the skin and can keep them away for about 24 hours. To clean your skin with CHG, you may be given: A CHG solution to use in the shower or as part of a sponge bath. A prepackaged cloth that contains CHG. Cleaning your skin with CHG may help lower the risk for infection: While you are staying in the intensive care unit of the hospital. If you have a vascular access, such as a central line, to provide short-term or long-term access to your veins. If you have a catheter to drain urine from your bladder. If you are on a ventilator. A ventilator is a machine that helps you breathe by moving air in and out of your lungs. After surgery. What are the risks? Risks of using CHG include: A skin reaction. Hearing loss, if CHG gets in your ears and you have a perforated eardrum. Eye injury, if CHG gets in your eyes and is not rinsed out. The CHG product catching fire. Make sure that you avoid smoking and flames after applying CHG to your skin. Do not use CHG: If you have a chlorhexidine allergy or have previously reacted to chlorhexidine. On babies younger than 30 months of age. How to use CHG solution Use CHG only as told by your health care provider, and follow the instructions on the label. Use  the full amount of CHG as directed. Usually, this is one bottle. During a shower Follow these steps when using CHG solution during a shower (unless your health care provider gives you different instructions): Start the shower. Use your normal soap and shampoo to wash your face and hair. Turn off the shower or move out of the shower stream. Pour the CHG onto a clean washcloth. Do not use any type of brush or rough-edged sponge. Starting at your neck, lather your body down to your toes. Make sure you follow these instructions: If you will be having surgery, pay special attention to the part of your body where you will be having surgery. Scrub this area for at least 1 minute. Do not use CHG on your head or face. If the solution gets into your ears or eyes, rinse them well with water. Avoid your genital area. Avoid any areas of skin that have broken skin, cuts, or scrapes. Scrub your back and under your arms. Make sure to wash skin folds. Let the lather sit on your skin for 1-2 minutes or as long as told by your health care provider. Thoroughly rinse your entire body in the shower. Make sure that all body creases and crevices are rinsed well. Dry off with a clean towel. Do not put any substances on your body afterward--such as powder, lotion, or perfume--unless you are told to do so by your health care provider. Only use lotions that are recommended by the manufacturer. Put on clean clothes or pajamas. If it is the night before your surgery, sleep in clean sheets.  During a sponge bath Follow these steps when using CHG solution during a sponge bath (unless your health care provider gives you different instructions): Use your normal soap and shampoo to wash your face and hair. Pour the CHG onto a clean washcloth. Starting at your neck, lather your body down to your toes. Make sure you follow these instructions: If you will  be having surgery, pay special attention to the part of your body where you  will be having surgery. Scrub this area for at least 1 minute. Do not use CHG on your head or face. If the solution gets into your ears or eyes, rinse them well with water. Avoid your genital area. Avoid any areas of skin that have broken skin, cuts, or scrapes. Scrub your back and under your arms. Make sure to wash skin folds. Let the lather sit on your skin for 1-2 minutes or as long as told by your health care provider. Using a different clean, wet washcloth, thoroughly rinse your entire body. Make sure that all body creases and crevices are rinsed well. Dry off with a clean towel. Do not put any substances on your body afterward--such as powder, lotion, or perfume--unless you are told to do so by your health care provider. Only use lotions that are recommended by the manufacturer. Put on clean clothes or pajamas. If it is the night before your surgery, sleep in clean sheets. How to use CHG prepackaged cloths Only use CHG cloths as told by your health care provider, and follow the instructions on the label. Use the CHG cloth on clean, dry skin. Do not use the CHG cloth on your head or face unless your health care provider tells you to. When washing with the CHG cloth: Avoid your genital area. Avoid any areas of skin that have broken skin, cuts, or scrapes. Before surgery Follow these steps when using a CHG cloth to clean before surgery (unless your health care provider gives you different instructions): Using the CHG cloth, vigorously scrub the part of your body where you will be having surgery. Scrub using a back-and-forth motion for 3 minutes. The area on your body should be completely wet with CHG when you are done scrubbing. Do not rinse. Discard the cloth and let the area air-dry. Do not put any substances on the area afterward, such as powder, lotion, or perfume. Put on clean clothes or pajamas. If it is the night before your surgery, sleep in clean sheets.  For general  bathing Follow these steps when using CHG cloths for general bathing (unless your health care provider gives you different instructions). Use a separate CHG cloth for each area of your body. Make sure you wash between any folds of skin and between your fingers and toes. Wash your body in the following order, switching to a new cloth after each step: The front of your neck, shoulders, and chest. Both of your arms, under your arms, and your hands. Your stomach and groin area, avoiding the genitals. Your right leg and foot. Your left leg and foot. The back of your neck, your back, and your buttocks. Do not rinse. Discard the cloth and let the area air-dry. Do not put any substances on your body afterward--such as powder, lotion, or perfume--unless you are told to do so by your health care provider. Only use lotions that are recommended by the manufacturer. Put on clean clothes or pajamas. Contact a health care provider if: Your skin gets irritated after scrubbing. You have questions about using your solution or cloth. You swallow any chlorhexidine. Call your local poison control center (1-(513)348-5444 in the [place] Get help right away if: Your eyes itch badly, or they become very red or swollen. Your skin itches badly and is red or swollen. Your hearing changes. You have trouble seeing. You have swelling or tingling in your mouth or throat. You  have trouble breathing. These symptoms may represent a serious problem that is an emergency. Do not wait to see if the symptoms will go away. Get medical help right away. Call your local emergency services (911 in the [place] Do not drive yourself to the hospital. Summary Chlorhexidine gluconate (CHG) is a germ-killing (antiseptic) solution that is used to clean the skin. Cleaning your skin with CHG may help to lower your risk for infection. You may be given CHG to use for bathing. It may be in a bottle or in a prepackaged cloth to use on your skin.  Carefully follow your health care provider's instructions and the instructions on the product label. Do not use CHG if you have a chlorhexidine allergy. Contact your health care provider if your skin gets irritated after scrubbing. This information is not intended to replace advice given to you by your health care provider. Make sure you discuss any questions you have with your health care provider. Document Revised: 02/04/2022 Document Reviewed: 12/18/2020 Elsevier Patient Education  [place].

## 2022-11-22 ENCOUNTER — Encounter (HOSPITAL_COMMUNITY): Payer: Self-pay | Admitting: Anesthesiology

## 2022-11-22 ENCOUNTER — Encounter (HOSPITAL_COMMUNITY)
Admission: RE | Admit: 2022-11-22 | Discharge: 2022-11-22 | Disposition: A | Discharge status: Home / Self Care | Payer: Medicaid Other | Source: Ambulatory Visit | Attending: Orthopedic Surgery | Admitting: Orthopedic Surgery

## 2022-11-22 ENCOUNTER — Encounter (HOSPITAL_COMMUNITY): Payer: Self-pay

## 2022-11-22 VITALS — BP 112/68 | HR 53 | Temp 97.6°F | Resp 18 | Ht 71.0 in | Wt 196.0 lb

## 2022-11-22 DIAGNOSIS — F1410 Cocaine abuse, uncomplicated: Secondary | ICD-10-CM | POA: Diagnosis not present

## 2022-11-22 DIAGNOSIS — Z01818 Encounter for other preprocedural examination: Secondary | ICD-10-CM | POA: Insufficient documentation

## 2022-11-22 DIAGNOSIS — I10 Essential (primary) hypertension: Secondary | ICD-10-CM | POA: Insufficient documentation

## 2022-11-22 DIAGNOSIS — R001 Bradycardia, unspecified: Secondary | ICD-10-CM | POA: Diagnosis not present

## 2022-11-22 DIAGNOSIS — Z720 Tobacco use: Secondary | ICD-10-CM

## 2022-11-22 DIAGNOSIS — G5601 Carpal tunnel syndrome, right upper limb: Secondary | ICD-10-CM | POA: Diagnosis not present

## 2022-11-22 HISTORY — DX: Personal history of urinary calculi: Z87.442

## 2022-11-22 HISTORY — DX: Bradycardia, unspecified: R00.1

## 2022-11-22 HISTORY — DX: Cardiac arrhythmia, unspecified: I49.9

## 2022-11-22 LAB — CBC WITH DIFFERENTIAL/PLATELET
Abs Immature Granulocytes: 0.03 10*3/uL (ref 0.00–0.07)
Basophils Absolute: 0.1 10*3/uL (ref 0.0–0.1)
Basophils Relative: 1 %
Eosinophils Absolute: 0.2 10*3/uL (ref 0.0–0.5)
Eosinophils Relative: 2 %
HCT: 46.1 % (ref 39.0–52.0)
Hemoglobin: 15.8 g/dL (ref 13.0–17.0)
Immature Granulocytes: 0 %
Lymphocytes Relative: 34 %
Lymphs Abs: 2.5 10*3/uL (ref 0.7–4.0)
MCH: 33.7 pg (ref 26.0–34.0)
MCHC: 34.3 g/dL (ref 30.0–36.0)
MCV: 98.3 fL (ref 80.0–100.0)
Monocytes Absolute: 0.6 10*3/uL (ref 0.1–1.0)
Monocytes Relative: 8 %
Neutro Abs: 4.1 10*3/uL (ref 1.7–7.7)
Neutrophils Relative %: 55 %
Platelets: 275 10*3/uL (ref 150–400)
RBC: 4.69 MIL/uL (ref 4.22–5.81)
RDW: 12.3 % (ref 11.5–15.5)
WBC: 7.5 10*3/uL (ref 4.0–10.5)
nRBC: 0 % (ref 0.0–0.2)

## 2022-11-22 LAB — BASIC METABOLIC PANEL
Anion gap: 8 (ref 5–15)
BUN: 20 mg/dL (ref 6–20)
CO2: 26 mmol/L (ref 22–32)
Calcium: 9.3 mg/dL (ref 8.9–10.3)
Chloride: 104 mmol/L (ref 98–111)
Creatinine, Ser: 1.11 mg/dL (ref 0.61–1.24)
GFR, Estimated: >60 mL/min (ref >60)
Glucose, Bld: 88 mg/dL (ref 70–99)
Potassium: 4.5 mmol/L (ref 3.5–5.1)
Sodium: 138 mmol/L (ref 135–145)

## 2022-11-22 LAB — RAPID URINE DRUG SCREEN, HOSP PERFORMED
Amphetamines: NOT DETECTED
Barbiturates: NOT DETECTED
Benzodiazepines: NOT DETECTED
Cocaine: POSITIVE — AB
Opiates: NOT DETECTED
Tetrahydrocannabinol: POSITIVE — AB

## 2022-11-22 NOTE — Progress Notes (Signed)
Patient scheduled for Right Carpal Tunnel on 11/26/2022. Chart indicates a history of blood clots.  Last Eliquis dose was over a week ago due to insufficient funds to purchase the medication. Dr Aline Brochure and Dr Burtis Junes notified. No clearance needed for surgery. We will proceed.

## 2022-11-25 ENCOUNTER — Telehealth: Payer: Self-pay | Admitting: Orthopedic Surgery

## 2022-11-25 NOTE — Telephone Encounter (Signed)
Tracey w/Day Surgery called, lvm stating that the patient's surgery was cancelled because he tested positive for Cocaine.  She stated that she has advised Dr. Aline Brochure and the patient, she stated this was FYI for the office.

## 2022-11-25 NOTE — Progress Notes (Signed)
Patient tested positive for cocaine.  Surgery cancelled.

## 2022-11-26 ENCOUNTER — Encounter (HOSPITAL_COMMUNITY): Admission: RE | Payer: Self-pay | Source: Home / Self Care

## 2022-11-26 ENCOUNTER — Ambulatory Visit (HOSPITAL_COMMUNITY): Admission: RE | Admit: 2022-11-26 | Payer: Medicaid Other | Source: Home / Self Care | Admitting: Orthopedic Surgery

## 2022-11-26 SURGERY — CARPAL TUNNEL RELEASE
Anesthesia: Choice | Laterality: Right

## 2022-11-27 ENCOUNTER — Telehealth: Payer: Self-pay | Admitting: Orthopedic Surgery

## 2022-11-27 NOTE — Telephone Encounter (Signed)
Patient called, lvm requesting to reschedule his surgery.  Pt's # [phone]

## 2022-11-28 NOTE — Telephone Encounter (Signed)
How long do I need to tell him to be off cocaine to RS his surgery ?

## 2022-11-29 NOTE — Telephone Encounter (Signed)
7 days

## 2022-12-02 NOTE — Telephone Encounter (Signed)
Called patient, no voicemail no answer.

## 2022-12-04 NOTE — Telephone Encounter (Signed)
Unable to reach If he calls back I will ask him if he can d/c the cocaine long enough for surgery And give him a new date.

## 2022-12-06 ENCOUNTER — Emergency Department (HOSPITAL_COMMUNITY)
Admission: EM | Admit: 2022-12-06 | Discharge: 2022-12-06 | Disposition: A | Discharge status: Home / Self Care | Payer: Medicaid Other | Attending: Student | Admitting: Student

## 2022-12-06 ENCOUNTER — Other Ambulatory Visit: Payer: Self-pay

## 2022-12-06 ENCOUNTER — Encounter (HOSPITAL_COMMUNITY): Payer: Self-pay

## 2022-12-06 ENCOUNTER — Emergency Department (HOSPITAL_COMMUNITY): Payer: Medicaid Other

## 2022-12-06 DIAGNOSIS — Z7901 Long term (current) use of anticoagulants: Secondary | ICD-10-CM | POA: Insufficient documentation

## 2022-12-06 DIAGNOSIS — I10 Essential (primary) hypertension: Secondary | ICD-10-CM | POA: Diagnosis not present

## 2022-12-06 DIAGNOSIS — Z79899 Other long term (current) drug therapy: Secondary | ICD-10-CM | POA: Diagnosis not present

## 2022-12-06 DIAGNOSIS — M5442 Lumbago with sciatica, left side: Secondary | ICD-10-CM

## 2022-12-06 DIAGNOSIS — M545 Low back pain, unspecified: Secondary | ICD-10-CM | POA: Diagnosis present

## 2022-12-06 MED ORDER — NAPROXEN 250 MG PO TABS
500.0000 mg | ORAL_TABLET | Freq: Once | ORAL | Status: AC
  Administered 2022-12-06: 500 mg via ORAL
  Filled 2022-12-06: qty 2

## 2022-12-06 MED ORDER — CYCLOBENZAPRINE HCL 10 MG PO TABS
10.0000 mg | ORAL_TABLET | Freq: Once | ORAL | Status: AC
  Administered 2022-12-06: 10 mg via ORAL
  Filled 2022-12-06: qty 1

## 2022-12-06 MED ORDER — LIDOCAINE 5 % EX PTCH
1.0000 | MEDICATED_PATCH | CUTANEOUS | Status: DC
  Administered 2022-12-06: 1 via TRANSDERMAL
  Filled 2022-12-06: qty 1

## 2022-12-06 MED ORDER — NAPROXEN 500 MG PO TABS: 500.0000 mg | ORAL_TABLET | Freq: Two times a day (BID) | ORAL | 0 refills | Status: DC

## 2022-12-06 MED ORDER — METHOCARBAMOL 500 MG PO TABS: 500.0000 mg | ORAL_TABLET | Freq: Two times a day (BID) | ORAL | 0 refills | Status: DC

## 2022-12-06 MED ORDER — HYDROCODONE-ACETAMINOPHEN 5-325 MG PO TABS
1.0000 | ORAL_TABLET | Freq: Once | ORAL | Status: AC
  Administered 2022-12-06: 1 via ORAL
  Filled 2022-12-06: qty 1

## 2022-12-06 MED ORDER — HYDROCODONE-ACETAMINOPHEN 5-325 MG PO TABS: 1.0000 | ORAL_TABLET | Freq: Four times a day (QID) | ORAL | 0 refills | Status: DC | PRN

## 2022-12-06 NOTE — ED Triage Notes (Signed)
Complaining of low back pain and spasms started 2 days ago, has eased up some but still hurts. History of back trouble.

## 2022-12-06 NOTE — Discharge Instructions (Addendum)
You are seen today for back pain.  We did a CT scan of your back shows some multilevel generative changes as listed below.  If you start having any numbness tingling in your groin, loss of control of her blobs or bladder, unable to empty her bladder, weakness in your legs or any other worsening symptoms come back right away otherwise follow-up with the back specialist.  Your CT showed  IMPRESSION:  1. No acute osseous abnormality.  2. Multilevel degenerative changes of the lumbar spine, worst at  L5-S1 where there is severe narrowing of the right lateral recess,  severe left neural foraminal narrowing, and moderate right neural  foraminal narrowing.  3. Left eccentric disc bulge at L4-L5 results in moderate narrowing  of the left lateral recess.  4. Mild spinal canal stenosis at L3-L4.

## 2022-12-06 NOTE — ED Provider Notes (Signed)
[facility] Provider Note   CSN: [id number] Arrival date & time: 12/06/22  1115     History  Chief Complaint  Patient presents with   Back Pain    Lance Cohen is a 52 y.o. male.  Past medical history of VTE, alcohol use, hypertension. He presents the ER complaining of left low back pain that started 4 days ago suddenly when he turned to talk to family member.  He is having radiation to the left buttock and down the posterior aspect of the left leg described as sharp and sudden.  He states the pain has gotten slightly better and he is able to ambulate but he still very uncomfortable and wanted to be evaluated and get some relief from his pain.  He is seen neurosurgery in the past for cervical spinal stenosis and has had history of back problems as well.  Denies IV drug use, is not diabetic on chronic steroids.  He does report drinking about 1/5 of liquor every 3 to 4 days.   Back Pain      Home Medications Prior to Admission medications   Medication Sig Start Date End Date Taking? Authorizing Provider  HYDROcodone-acetaminophen (NORCO) 5-325 MG tablet Take 1 tablet by mouth every 6 (six) hours as needed for moderate pain. 12/06/22  Yes Renne Cornick A, PA-C  methocarbamol (ROBAXIN) 500 MG tablet Take 1 tablet (500 mg total) by mouth 2 (two) times daily. 12/06/22  Yes Archer Vise A, PA-C  acetaminophen (TYLENOL) 500 MG tablet Take 1 tablet (500 mg total) by mouth every 6 (six) hours as needed. Patient not taking: Reported on 09/23/2022 06/07/22   Varney Biles, MD  albuterol (VENTOLIN HFA) 108 (90 Base) MCG/ACT inhaler Inhale 1-2 puffs into the lungs every 6 (six) hours as needed for wheezing or shortness of breath. Patient not taking: Reported on 11/11/2022 01/17/21   Dorie Rank, MD  Apixaban (ELIQUIS PO) Take 5 mg by mouth 2 (two) times daily.    [provider]  EPINEPHrine 0.3 mg/0.3 mL IJ SOAJ injection Inject 0.3 mg  into the muscle as needed for anaphylaxis (For severe allergic reaction only with difficulty breathing, tongue swelling or throat swelling.). Patient not taking: Reported on 11/11/2022 04/08/21   Ezequiel Essex, MD  SUMAtriptan (IMITREX) 50 MG tablet Take 1 tablet (50 mg total) by mouth every 2 (two) hours as needed for migraine. May repeat in 2 hours if headache persists or recurs. Patient not taking: Reported on 09/23/2022 09/11/21   Alric Ran, MD  tamsulosin (FLOMAX) 0.4 MG CAPS capsule Take 1 capsule (0.4 mg total) by mouth daily after supper. Patient not taking: Reported on 09/23/2022 07/22/22   Sherrell Puller, PA-C      Allergies    Lovenox [enoxaparin sodium], Bee venom, Penicillins, Zofran [ondansetron], Other, and Sulfa antibiotics    Review of Systems   Review of Systems  Musculoskeletal:  Positive for back pain.    Physical Exam Updated Vital Signs BP (!) 107/97   Pulse (!) 57   Temp (!) 97.5 F (36.4 C) (Oral)   Resp 18   Ht 5' 11"$  (1.803 m)   Wt 90.7 kg   SpO2 100%   BMI 27.89 kg/m  Physical Exam Vitals and nursing note reviewed.  Constitutional:      General: He is not in acute distress.    Appearance: He is well-developed.  HENT:     Head: Normocephalic and atraumatic.  Nose: Nose normal.     Mouth/Throat:     Mouth: Mucous membranes are moist.  Eyes:     Conjunctiva/sclera: Conjunctivae normal.  Cardiovascular:     Rate and Rhythm: Normal rate and regular rhythm.     Heart sounds: No murmur heard. Pulmonary:     Effort: Pulmonary effort is normal. No respiratory distress.     Breath sounds: Normal breath sounds.  Abdominal:     Palpations: Abdomen is soft.     Tenderness: There is no abdominal tenderness.  Musculoskeletal:        General: No swelling.     Cervical back: Neck supple.  Skin:    General: Skin is warm and dry.     Capillary Refill: Capillary refill takes less than 2 seconds.  Neurological:     General: No focal deficit present.      Mental Status: He is alert.     Motor: No weakness.     Comments: Patient has antalgic gait but is able to ambulate  Psychiatric:        Mood and Affect: Mood normal.     ED Results / Procedures / Treatments   Labs (all labs ordered are listed, but only abnormal results are displayed) Labs Reviewed - No data to display  EKG None  Radiology CT Lumbar Spine Wo Contrast  Result Date: 12/06/2022 CLINICAL DATA:  Pain, no trauma. EXAM: CT LUMBAR SPINE WITHOUT CONTRAST TECHNIQUE: Multidetector CT imaging of the lumbar spine was performed without intravenous contrast administration. Multiplanar CT image reconstructions were also generated. RADIATION DOSE REDUCTION: This exam was performed according to the departmental dose-optimization program which includes automated exposure control, adjustment of the mA and/or kV according to patient size and/or use of iterative reconstruction technique. COMPARISON:  CT abdomen/pelvis 07/22/2022. FINDINGS: Segmentation: Conventional numbering is assumed with 5 non-rib-bearing, lumbar type vertebral bodies. Alignment: Normal. Vertebrae: Normal vertebral body heights. No acute fracture. Mild degenerative endplate changes at 075-GRM. Paraspinal and other soft tissues: Mild fatty infiltration of the lower lumbar paraspinal muscles. Disc levels: T12-L1:  Normal. L1-L2: Small disc bulge without spinal canal stenosis or neural foraminal narrowing. L2-L3: Small disc bulge without spinal canal stenosis or neural foraminal narrowing. L3-L4: Small disc bulge and mild bilateral facet arthropathy results in mild spinal canal stenosis and mild bilateral neural foraminal narrowing. L4-L5: Moderate disc height loss with vacuum disc. Left eccentric disc bulge results in moderate narrowing of the left lateral recess. Facet arthropathy contributes to mild bilateral neural foraminal narrowing. L5-S1: Severe disc height loss. Disc bulge and right-greater-than-left facet arthropathy  results in severe narrowing of the right lateral recess. Endplate osteophytes contribute to severe left and moderate right neural foraminal narrowing. IMPRESSION: 1. No acute osseous abnormality. 2. Multilevel degenerative changes of the lumbar spine, worst at L5-S1 where there is severe narrowing of the right lateral recess, severe left neural foraminal narrowing, and moderate right neural foraminal narrowing. 3. Left eccentric disc bulge at L4-L5 results in moderate narrowing of the left lateral recess. 4. Mild spinal canal stenosis at L3-L4. Electronically Signed   By: Emmit Alexanders M.D.   On: 12/06/2022 12:49    Procedures Procedures    Medications Ordered in ED Medications  lidocaine (LIDODERM) 5 % 1 patch (1 patch Transdermal Patch Applied 12/06/22 1220)  naproxen (NAPROSYN) tablet 500 mg (500 mg Oral Given 12/06/22 1220)  cyclobenzaprine (FLEXERIL) tablet 10 mg (10 mg Oral Given 12/06/22 1220)  HYDROcodone-acetaminophen (NORCO/VICODIN) 5-325 MG per tablet 1 tablet (1  tablet Oral Given 12/06/22 1404)    ED Course/ Medical Decision Making/ A&P                             Medical Decision Making This patient presents to the ED for concern of left low back pain radiating down posterior aspect of left leg, this involves an extensive number of treatment options, and is a complaint that carries with it a high risk of complications and morbidity.  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 No acute osseous  abnormality. 2. Multilevel degenerative changes of the lumbar spine, worst at L5-S1 where there is severe narrowing of the right lateral recess, severe left neural foraminal narrowing, and moderate right neural foraminal narrowing. 3. Left eccentric disc bulge at L4-L5 results in moderate narrowing of the left lateral recess. 4. Mild spinal canal stenosis at L3-L4.        Problem List / ED Course / Critical interventions / Medication management  Chronic left low back pain for the past 4 days-patient has level degenerative disc changes as above and CT report with family history significant for sciatic type pain in the left leg.  He is feeling somewhat better after the Norco.  Did not get any significant relief with naproxen and muscle relaxers and Lidoderm.  He is on anticoagulation so we will avoid outpatient NSAIDs.  Normally would have trialed some steroids but patient states he cannot take these either. No signs or symptoms of cauda equina, he has no infectious signs or symptoms including fever, tachycardia, no overlying skin changes of the low back on exam including cellulitis crepitus to suggest an abscess.  He is not a diabetic or on long-term steroids and does not use IV drugs. I did consider paraspinous or epidural abscess though do not think this is likely given patient's symptoms.    Patient states his pain has been improving over the past couple of days rather than worsening.  Able to ambulate.  He has no signs or symptoms of cord compression.  I discussed with him we will have him follow-up outpatient with spine specialist and have him return to the ED for new or worsening symptoms. I ordered medication including Norco for pain Reevaluation of the patient after these medicines showed that the patient improved I have reviewed the patients home medicines and have made adjustments as needed   Social Determinants of Health:  Alcohol use disorder   Test / Admission -  Considered:  High L-spine but patient has no signs or symptoms of cord compression, no indication for further imaging at this time.  Discussed follow-up with neurosurgery.  Patient has seen neurosurgery in [facility] before but states it would be easier for him to get to [facility] so was provided information for this   Amount and/or Complexity of Data Reviewed Radiology: ordered.  Risk Prescription drug management.           Final Clinical Impression(s) / ED Diagnoses Final diagnoses:  Acute left-sided low back pain with left-sided sciatica  Rx / DC Orders ED Discharge Orders          Ordered    naproxen (NAPROSYN) 500 MG tablet  2 times daily,   Status:  Discontinued        12/06/22 1354    methocarbamol (ROBAXIN) 500 MG tablet  2 times daily        12/06/22 1354    HYDROcodone-acetaminophen (NORCO) 5-325 MG tablet  Every 6 hours PRN        12/06/22 [address], PA-C 12/06/22 1610    [place], [place], MD 12/06/22 1909

## 2022-12-16 DIAGNOSIS — Z6826 Body mass index (BMI) 26.0-26.9, adult: Secondary | ICD-10-CM | POA: Diagnosis not present

## 2022-12-16 DIAGNOSIS — M4726 Other spondylosis with radiculopathy, lumbar region: Secondary | ICD-10-CM | POA: Diagnosis not present

## 2022-12-18 ENCOUNTER — Other Ambulatory Visit (HOSPITAL_COMMUNITY): Payer: Self-pay | Admitting: Neurosurgery

## 2022-12-18 DIAGNOSIS — M4726 Other spondylosis with radiculopathy, lumbar region: Secondary | ICD-10-CM

## 2022-12-19 ENCOUNTER — Encounter: Payer: Self-pay | Admitting: Radiology

## 2022-12-31 DIAGNOSIS — F315 Bipolar disorder, current episode depressed, severe, with psychotic features: Secondary | ICD-10-CM | POA: Diagnosis not present

## 2023-01-06 ENCOUNTER — Encounter (HOSPITAL_COMMUNITY): Payer: Self-pay | Admitting: Emergency Medicine

## 2023-01-06 ENCOUNTER — Other Ambulatory Visit: Payer: Self-pay

## 2023-01-06 ENCOUNTER — Emergency Department (HOSPITAL_COMMUNITY)
Admission: EM | Admit: 2023-01-06 | Discharge: 2023-01-06 | Disposition: A | Discharge status: Home / Self Care | Payer: Medicaid Other | Attending: Emergency Medicine | Admitting: Emergency Medicine

## 2023-01-06 DIAGNOSIS — Z7901 Long term (current) use of anticoagulants: Secondary | ICD-10-CM | POA: Insufficient documentation

## 2023-01-06 DIAGNOSIS — Y906 Blood alcohol level of 120-199 mg/100 ml: Secondary | ICD-10-CM | POA: Diagnosis not present

## 2023-01-06 DIAGNOSIS — T450X1A Poisoning by antiallergic and antiemetic drugs, accidental (unintentional), initial encounter: Secondary | ICD-10-CM | POA: Insufficient documentation

## 2023-01-06 DIAGNOSIS — F1090 Alcohol use, unspecified, uncomplicated: Secondary | ICD-10-CM | POA: Insufficient documentation

## 2023-01-06 DIAGNOSIS — T50901A Poisoning by unspecified drugs, medicaments and biological substances, accidental (unintentional), initial encounter: Secondary | ICD-10-CM

## 2023-01-06 LAB — CBC WITH DIFFERENTIAL/PLATELET
Abs Immature Granulocytes: 0.01 10*3/uL (ref 0.00–0.07)
Basophils Absolute: 0.1 10*3/uL (ref 0.0–0.1)
Basophils Relative: 1 %
Eosinophils Absolute: 0.2 10*3/uL (ref 0.0–0.5)
Eosinophils Relative: 3 %
HCT: 40.9 % (ref 39.0–52.0)
Hemoglobin: 14.2 g/dL (ref 13.0–17.0)
Immature Granulocytes: 0 %
Lymphocytes Relative: 51 %
Lymphs Abs: 3.7 10*3/uL (ref 0.7–4.0)
MCH: 33.6 pg (ref 26.0–34.0)
MCHC: 34.7 g/dL (ref 30.0–36.0)
MCV: 96.7 fL (ref 80.0–100.0)
Monocytes Absolute: 0.5 10*3/uL (ref 0.1–1.0)
Monocytes Relative: 8 %
Neutro Abs: 2.7 10*3/uL (ref 1.7–7.7)
Neutrophils Relative %: 37 %
Platelets: 224 10*3/uL (ref 150–400)
RBC: 4.23 MIL/uL (ref 4.22–5.81)
RDW: 12 % (ref 11.5–15.5)
WBC: 7.2 10*3/uL (ref 4.0–10.5)
nRBC: 0 % (ref 0.0–0.2)

## 2023-01-06 LAB — COMPREHENSIVE METABOLIC PANEL WITH GFR
ALT: 13 U/L (ref 0–44)
AST: 19 U/L (ref 15–41)
Albumin: 3.6 g/dL (ref 3.5–5.0)
Alkaline Phosphatase: 68 U/L (ref 38–126)
Anion gap: 8 (ref 5–15)
BUN: 12 mg/dL (ref 6–20)
CO2: 23 mmol/L (ref 22–32)
Calcium: 8.2 mg/dL — ABNORMAL LOW (ref 8.9–10.3)
Chloride: 108 mmol/L (ref 98–111)
Creatinine, Ser: 1.09 mg/dL (ref 0.61–1.24)
GFR, Estimated: >60 mL/min
Glucose, Bld: 80 mg/dL (ref 70–99)
Potassium: 3.3 mmol/L — ABNORMAL LOW (ref 3.5–5.1)
Sodium: 139 mmol/L (ref 135–145)
Total Bilirubin: 0.4 mg/dL (ref 0.3–1.2)
Total Protein: 6.5 g/dL (ref 6.5–8.1)

## 2023-01-06 LAB — RAPID URINE DRUG SCREEN, HOSP PERFORMED
Amphetamines: NOT DETECTED
Barbiturates: NOT DETECTED
Benzodiazepines: NOT DETECTED
Cocaine: POSITIVE — AB
Opiates: NOT DETECTED
Tetrahydrocannabinol: POSITIVE — AB

## 2023-01-06 LAB — ACETAMINOPHEN LEVEL
Acetaminophen (Tylenol), Serum: <10 ug/mL — ABNORMAL LOW (ref 10–30)
Acetaminophen (Tylenol), Serum: <10 ug/mL — ABNORMAL LOW (ref 10–30)

## 2023-01-06 LAB — SALICYLATE LEVEL: Salicylate Lvl: <7 mg/dL — ABNORMAL LOW (ref 7.0–30.0)

## 2023-01-06 LAB — ETHANOL: Alcohol, Ethyl (B): 140 mg/dL — ABNORMAL HIGH

## 2023-01-06 NOTE — ED Notes (Signed)
Pt's spouse called and said she would try to find someone to pick up pt

## 2023-01-06 NOTE — ED Notes (Signed)
Attempted to call pt's wife per pt for discharge x 2. No answer, left VM.

## 2023-01-06 NOTE — ED Provider Notes (Signed)
Lance Cohen  Provider Note  CSN: [id number] Arrival date & time: 01/06/23 0102  History Chief Complaint  Patient presents with   Drug Overdose    Lance Cohen is a 52 y.o. male reports he was arguing with his wife earlier tonight after drinking alcohol. He decided to take a overdose of hydroxyzine because if he was asleep he wouldn't have to listen to his wife fussing at home. He denies an attempt at self harm. Has no other symptoms at this time. States he thinks it was about 15-20 x 50mg  tabs. He had the prescription of 30 tabs filled in October. Bottle is empty now. Denies any other co-ingestions.    Home Medications Prior to Admission medications   Medication Sig Start Date End Date Taking? Authorizing Provider  Apixaban (ELIQUIS PO) Take 5 mg by mouth 2 (two) times daily.    [provider]  HYDROcodone-acetaminophen (NORCO) 5-325 MG tablet Take 1 tablet by mouth every 6 (six) hours as needed for moderate pain. 12/06/22   Sherrye Payor A, PA-C  methocarbamol (ROBAXIN) 500 MG tablet Take 1 tablet (500 mg total) by mouth 2 (two) times daily. 12/06/22   Sherrye Payor A, PA-C     Allergies    Lovenox [enoxaparin sodium], Bee venom, Penicillins, Zofran [ondansetron], Other, and Sulfa antibiotics   Review of Systems   Review of Systems Please see HPI for pertinent positives and negatives  Physical Exam BP 103/72   Pulse 64   Temp 98.3 F (36.8 C) (Oral)   Resp 16   Ht 5\' 11"  (1.803 m)   Wt 59 kg   SpO2 93%   BMI 18.13 kg/m   Physical Exam Vitals and nursing note reviewed.  Constitutional:      Appearance: Normal appearance.     Comments: intoxicated  HENT:     Head: Normocephalic and atraumatic.     Nose: Nose normal.     Mouth/Throat:     Mouth: Mucous membranes are moist.  Eyes:     Extraocular Movements: Extraocular movements intact.     Conjunctiva/sclera: Conjunctivae normal.  Cardiovascular:      Rate and Rhythm: Normal rate.  Pulmonary:     Effort: Pulmonary effort is normal.     Breath sounds: Normal breath sounds.  Abdominal:     General: Abdomen is flat.     Palpations: Abdomen is soft.     Tenderness: There is no abdominal tenderness.  Musculoskeletal:        General: No swelling. Normal range of motion.     Cervical back: Neck supple.  Skin:    General: Skin is warm and dry.  Neurological:     General: No focal deficit present.  Psychiatric:     Comments: intoxicated     ED Results / Procedures / Treatments   EKG EKG Interpretation  Date/Time:  Monday January 06 2023 01:15:24 EDT Ventricular Rate:  65 PR Interval:  147 QRS Duration: 98 QT Interval:  449 QTC Calculation: 467 R Axis:   22 Text Interpretation: Sinus rhythm Borderline low voltage, extremity leads No significant change since last tracing Confirmed by Calvert Cantor [phone] on 01/06/2023 1:28:48 AM  Procedures Procedures  Medications Ordered in the ED Medications - No data to display  Initial Impression and Plan  Patient here with overdose of hydroxyzine and alcohol. Denies self harm, just wanted to sleep. He states wife called EMS. Unclear what time he took them. Will  check labs. Overall exam and vitals are reassuring although he is somnolent. Will continue to observe in the ED for sobering.   ED Course   Clinical Course as of 01/06/23 0633  Mon Jan 06, 2023  0220 CBC is normal.  [CS]  [id number] CMP unremarkable. EtOH is elevated. APAP/ASA are neg. Per poison control, recommend 6 hrs total observation time, recheck APAP level at 4hrs given unclear time of ingestion  [CS]  0232 UDS positive for cocaine and THC [CS]  0540 APAP remains normal. He has been hemodynamically stable through the night, sleeping soundly. Will reassess his intent when he is sober and if he continues to deny SI will plan discharge home. Otherwise he will have TTS evaluation.  [CS]  [phone] Patient more awake now. Continues to  deny SI. Feels safe going home.  [CS]    Clinical Course User Index [CS] [name], MD     MDM Rules/Calculators/A&P Medical Decision Making Given presenting complaint, I considered that admission might be necessary. After review of results from ED lab and/or imaging studies, admission to the hospital is not indicated at this time.    Problems Addressed: Accidental overdose, initial encounter: acute illness or injury  Amount and/or Complexity of Data Reviewed Labs: ordered. Decision-making details documented in ED Course. ECG/medicine tests: ordered and independent interpretation performed. Decision-making details documented in ED Course.  Risk Decision regarding hospitalization.     Final Clinical Impression(s) / ED Diagnoses Final diagnoses:  Accidental overdose, initial encounter    Rx / DC Orders ED Discharge Orders     None        [name], MD 01/06/23 [phone]

## 2023-01-06 NOTE — ED Notes (Signed)
Poison control contacted. Recommendations are as follows:  monitor for 6hrs. Hydroxyzine OD may cause agitation, tachycardia and/or seizures. Pt should have an acetaminophen, ETOH, and CMP ordered now and repeat in 4 hrs. EKG now and in 4 hrs. Poison control recommends benzodiazepines if seizes or has agitation, and fluid if tachycardic.

## 2023-01-06 NOTE — ED Triage Notes (Signed)
Pt brought in by EMS after he had been arguing with his wife and took an unknown amount of hydroxyzine HCL 50mg  tabs along with ETOH. Pt denies SI or HI. States he took the pills by accident. Stated that he figured if he took them, he "would go to sleep and then he would not be able to argue with his wife anymore".

## 2023-01-10 ENCOUNTER — Ambulatory Visit (HOSPITAL_COMMUNITY): Payer: Medicaid Other

## 2023-01-10 ENCOUNTER — Encounter (HOSPITAL_COMMUNITY): Payer: Self-pay

## 2023-01-14 DIAGNOSIS — F315 Bipolar disorder, current episode depressed, severe, with psychotic features: Secondary | ICD-10-CM | POA: Diagnosis not present

## 2023-01-31 DIAGNOSIS — F315 Bipolar disorder, current episode depressed, severe, with psychotic features: Secondary | ICD-10-CM | POA: Diagnosis not present

## 2023-02-03 ENCOUNTER — Encounter: Payer: Self-pay | Admitting: *Deleted

## 2023-02-10 DIAGNOSIS — J4489 Other specified chronic obstructive pulmonary disease: Secondary | ICD-10-CM | POA: Diagnosis not present

## 2023-02-10 DIAGNOSIS — F310 Bipolar disorder, current episode hypomanic: Secondary | ICD-10-CM | POA: Diagnosis not present

## 2023-02-10 DIAGNOSIS — Z87892 Personal history of anaphylaxis: Secondary | ICD-10-CM | POA: Diagnosis not present

## 2023-02-10 DIAGNOSIS — F17210 Nicotine dependence, cigarettes, uncomplicated: Secondary | ICD-10-CM | POA: Diagnosis not present

## 2023-02-10 DIAGNOSIS — F411 Generalized anxiety disorder: Secondary | ICD-10-CM | POA: Diagnosis not present

## 2023-02-10 DIAGNOSIS — Z86718 Personal history of other venous thrombosis and embolism: Secondary | ICD-10-CM | POA: Diagnosis not present

## 2023-02-10 DIAGNOSIS — Z79899 Other long term (current) drug therapy: Secondary | ICD-10-CM | POA: Diagnosis not present

## 2023-02-12 ENCOUNTER — Ambulatory Visit (HOSPITAL_COMMUNITY)
Admission: RE | Admit: 2023-02-12 | Discharge: 2023-02-12 | Disposition: A | Discharge status: Home / Self Care | Payer: Medicaid Other | Source: Ambulatory Visit | Attending: Neurosurgery | Admitting: Neurosurgery

## 2023-02-12 DIAGNOSIS — M545 Low back pain, unspecified: Secondary | ICD-10-CM | POA: Diagnosis not present

## 2023-02-12 DIAGNOSIS — M4726 Other spondylosis with radiculopathy, lumbar region: Secondary | ICD-10-CM | POA: Insufficient documentation

## 2023-02-14 ENCOUNTER — Encounter (HOSPITAL_COMMUNITY): Payer: Self-pay

## 2023-02-14 ENCOUNTER — Emergency Department (HOSPITAL_COMMUNITY)
Admission: EM | Admit: 2023-02-14 | Discharge: 2023-02-14 | Disposition: A | Discharge status: Home / Self Care | Payer: Medicaid Other | Attending: Emergency Medicine | Admitting: Emergency Medicine

## 2023-02-14 ENCOUNTER — Other Ambulatory Visit: Payer: Self-pay

## 2023-02-14 DIAGNOSIS — M5442 Lumbago with sciatica, left side: Secondary | ICD-10-CM | POA: Insufficient documentation

## 2023-02-14 DIAGNOSIS — M5441 Lumbago with sciatica, right side: Secondary | ICD-10-CM | POA: Diagnosis not present

## 2023-02-14 DIAGNOSIS — M545 Low back pain, unspecified: Secondary | ICD-10-CM | POA: Diagnosis present

## 2023-02-14 DIAGNOSIS — G8929 Other chronic pain: Secondary | ICD-10-CM

## 2023-02-14 MED ORDER — CYCLOBENZAPRINE HCL 10 MG PO TABS
10.0000 mg | ORAL_TABLET | Freq: Once | ORAL | Status: AC
  Administered 2023-02-14: 10 mg via ORAL
  Filled 2023-02-14: qty 1

## 2023-02-14 MED ORDER — KETOROLAC TROMETHAMINE 15 MG/ML IJ SOLN
15.0000 mg | Freq: Once | INTRAMUSCULAR | Status: AC
  Administered 2023-02-14: 15 mg via INTRAMUSCULAR
  Filled 2023-02-14: qty 1

## 2023-02-14 MED ORDER — ACETAMINOPHEN 500 MG PO TABS
1000.0000 mg | ORAL_TABLET | Freq: Once | ORAL | Status: AC
  Administered 2023-02-14: 1000 mg via ORAL
  Filled 2023-02-14: qty 2

## 2023-02-14 MED ORDER — IBUPROFEN 600 MG PO TABS: 600.0000 mg | ORAL_TABLET | Freq: Three times a day (TID) | ORAL | 0 refills | Status: AC | PRN

## 2023-02-14 MED ORDER — CYCLOBENZAPRINE HCL 10 MG PO TABS: 10.0000 mg | ORAL_TABLET | Freq: Two times a day (BID) | ORAL | 0 refills | Status: AC | PRN

## 2023-02-14 MED ORDER — LIDOCAINE 5 % EX PTCH: 1.0000 | MEDICATED_PATCH | CUTANEOUS | 0 refills | Status: AC

## 2023-02-14 MED ORDER — LIDOCAINE 5 % EX PTCH
1.0000 | MEDICATED_PATCH | CUTANEOUS | Status: DC
  Administered 2023-02-14: 1 via TRANSDERMAL
  Filled 2023-02-14: qty 1

## 2023-02-14 NOTE — ED Triage Notes (Addendum)
Pt presents with acute on chronic back pain that started 3-4 days ago when he turned suddenly in the bed. Pt has radiation of pain down both legs and reports "tingling" in both feet. Denies urinary or fecal incontinence. Pt with hx of spinal stenosis. Pt had an MRI of the spine on 4/24.

## 2023-02-14 NOTE — ED Notes (Signed)
Pt provided discharge instructions and prescription information. Pt was given the opportunity to ask questions and questions were answered.   

## 2023-02-14 NOTE — ED Provider Notes (Signed)
[place] EMERGENCY DEPARTMENT AT [facility] Provider Note   CSN: [id number] Arrival date & time: 02/14/23  1329     History  Chief Complaint  Patient presents with   Back Pain    Lance Cohen is a 52 y.o. male with past medical history of bipolar disorder, DVT, spinal stenosis who presents to the ED complaining of lower back pain.  He has a history of chronic back pain and states that it began flaring up about 3 days ago.  He had an outpatient MRI of the lumbar spine 2 days ago.  He denies associated fever, chills, nausea, vomiting, chest pain, abdominal pain, urinary symptoms, bowel or bladder dysfunction, saddle anesthesia, or new onset paresthesias.  States this feels like his typical back pain.  No history of malignancy.  Denies current IV drug use.  Is scheduled to follow-up with his back specialist on 4/30 for further discussion and management of his chronic back pain.  He denies taking any pain or nerve medication at home.      Home Medications Prior to Admission medications   Medication Sig Start Date End Date Taking? Authorizing Provider  cyclobenzaprine (FLEXERIL) 10 MG tablet Take 1 tablet (10 mg total) by mouth 2 (two) times daily as needed for up to 3 days for muscle spasms. 02/14/23 02/17/23 Yes Dara Camargo L, PA-C  ibuprofen (ADVIL) 600 MG tablet Take 1 tablet (600 mg total) by mouth every 8 (eight) hours as needed for up to 3 days for mild pain or moderate pain. 02/14/23 02/17/23 Yes Eleanor Dimichele L, PA-C  lidocaine (LIDODERM) 5 % Place 1 patch onto the skin daily for 3 days. Remove & Discard patch within 12 hours or as directed by MD 02/14/23 02/17/23 Yes Dinh Ayotte L, PA-C  Apixaban (ELIQUIS PO) Take 5 mg by mouth 2 (two) times daily.    [provider]  HYDROcodone-acetaminophen (NORCO) 5-325 MG tablet Take 1 tablet by mouth every 6 (six) hours as needed for moderate pain. 12/06/22   Carmel Sacramento A, PA-C  methocarbamol (ROBAXIN) 500 MG  tablet Take 1 tablet (500 mg total) by mouth 2 (two) times daily. 12/06/22   Carmel Sacramento A, PA-C      Allergies    Lovenox [enoxaparin sodium], Bee venom, Penicillins, Zofran [ondansetron], Other, and Sulfa antibiotics    Review of Systems   Review of Systems  All other systems reviewed and are negative.   Physical Exam Updated Vital Signs BP 115/71   Pulse 74   Temp 97.7 F (36.5 C) (Oral)   Resp (!) 24   Ht 5\' 11"  (1.803 m)   Wt 83.9 kg   SpO2 96%   BMI 25.80 kg/m  Physical Exam Vitals and nursing note reviewed.  Constitutional:      General: He is not in acute distress.    Appearance: Normal appearance. He is not ill-appearing, toxic-appearing or diaphoretic.  HENT:     Head: Normocephalic and atraumatic.     Mouth/Throat:     Mouth: Mucous membranes are moist.  Eyes:     General: No scleral icterus.    Extraocular Movements: Extraocular movements intact.     Conjunctiva/sclera: Conjunctivae normal.  Cardiovascular:     Rate and Rhythm: Normal rate and regular rhythm.     Heart sounds: No murmur heard. Pulmonary:     Effort: Pulmonary effort is normal.     Breath sounds: Normal breath sounds.  Abdominal:     General: Abdomen is  flat. There is no distension.     Palpations: Abdomen is soft.     Tenderness: There is no abdominal tenderness. There is no right CVA tenderness, left CVA tenderness, guarding or rebound.  Musculoskeletal:        General: Normal range of motion.     Cervical back: Normal range of motion and neck supple. No rigidity or tenderness.     Right lower leg: No edema.     Left lower leg: No edema.     Comments: No midline CTL spinal tenderness, step-offs, or deformities, moderate tenderness over the paraspinous muscles of the lumbar region with spasming, range of motion and strength intact  Skin:    General: Skin is warm and dry.     Capillary Refill: Capillary refill takes less than 2 seconds.     Findings: No rash.  Neurological:      General: No focal deficit present.     Mental Status: He is alert. Mental status is at baseline.  Psychiatric:        Behavior: Behavior normal.     ED Results / Procedures / Treatments   Labs (all labs ordered are listed, but only abnormal results are displayed) Labs Reviewed - No data to display  EKG None  Radiology No results found.  Procedures Procedures    Medications Ordered in ED Medications  ketorolac (TORADOL) 15 MG/ML injection 15 mg (has no administration in time range)  cyclobenzaprine (FLEXERIL) tablet 10 mg (has no administration in time range)  acetaminophen (TYLENOL) tablet 1,000 mg (has no administration in time range)  lidocaine (LIDODERM) 5 % 1 patch (has no administration in time range)    ED Course/ Medical Decision Making/ A&P                             Medical Decision Making  Medical Decision Making:   Lance Cohen is a 52 y.o. male who presented to the ED today with back pain detailed above.    Patient's presentation is complicated by their history of spinal stenosis, chronic back pain.  Complete initial physical exam performed, notably the patient  was in no acute distress.  No midline spinal tenderness, step-offs, or deformities.  Tenderness and spasming of the paraspinous muscles of the lumbar region.  Neurologically intact.  No meningismus.  Abdomen soft and nontender.    Reviewed and confirmed nursing documentation for past medical history, family history, social history.    Initial Assessment:   With the patient's presentation of back pain, the emergent differential diagnosis for back pain includes but is not limited to fracture, muscle strain, cauda equina, spinal stenosis, DDD, ankylosing spondylitis, acute ligamentous injury, disk herniation, spondylolisthesis, epidural compression syndrome, metastatic cancer, transverse myelitis, vertebral osteomyelitis, diskitis, kidney stone, pyelonephritis, AAA, Perforated ulcer, retrocecal  appendicitis, pancreatitis, bowel obstruction, retroperitoneal hemorrhage or mass, meningitis.   Initial Plan:  Symptomatic management Reviewed recent MRI scan Objective evaluation as reviewed   Initial Study Results:   Radiology:  All images reviewed independently. Agree with radiology report at this time.   MR LUMBAR SPINE WO CONTRAST  Result Date: 02/14/2023 CLINICAL DATA:  Low back pain EXAM: MRI LUMBAR SPINE WITHOUT CONTRAST TECHNIQUE: Multiplanar, multisequence MR imaging of the lumbar spine was performed. No intravenous contrast was administered. COMPARISON:  No prior MRI of the lumbar spine available, correlation is made with CT lumbar spine 12/06/2022 FINDINGS: Segmentation:  5 lumbar type vertebral bodies. Alignment: Mild  levocurvature. Straightening of the normal lumbar lordosis. Trace retrolisthesis of L5 on S1. Vertebrae: No acute fracture, evidence of discitis, or suspicious osseous lesion. Conus medullaris and cauda equina: Conus extends to the L1-L2 level. Conus and cauda equina appear normal. Paraspinal and other soft tissues: Negative. Disc levels: T12-L1: Seen only on the sagittal images. No significant disc bulge, spinal canal stenosis, or neural foraminal narrowing. L1-L2: Minimal disc bulge. Mild facet arthropathy. No spinal canal stenosis or neural foraminal narrowing. L2-L3: Minimal disc bulge with small left foraminal protrusion. Mild facet arthropathy. No spinal canal stenosis or neural foraminal narrowing. L3-L4: Minimal disc bulge. Mild right-greater-than-left facet arthropathy. No spinal canal stenosis. Mild bilateral neural foraminal narrowing. L4-L5: Mild disc bulge with left subarticular and foraminal protrusion. Mild right greater than left facet arthropathy. Narrowing of the left lateral recess. No spinal canal stenosis. No neural foraminal narrowing. L5-S1: Trace retrolisthesis and mild disc bulge, which may contact the exiting L5 nerves. Mild-to-moderate right and mild  left facet arthropathy. Narrowing of the right lateral recess. No spinal canal stenosis. Moderate left neural foraminal narrowing. IMPRESSION: 1. L5-S1 moderate left neural foraminal narrowing. Narrowing of the right lateral recess at this level could affect the descending right S1 nerve roots. The disc bulge at L5-S1 may contact the exiting L5 nerve. 2. L3-L4 mild bilateral neural foraminal narrowing. 3. Narrowing of the left lateral recess at L4-L5 could affect the descending left L5 nerve roots. Electronically Signed   By: Wiliam Ke M.D.   On: 02/14/2023 13:57      Consults: Case discussed with radiology tech who will send MRI scan to the queue to be read.   Final Assessment and Plan:   Patient presents to ED c/o back pain. No red flag symptoms. No history of malignancy or recent IV drug use. No severe or acute trauma to the back. No midline spinal tenderness, stepoffs, or deformities. No meningismus. Abdomen soft and nontender. Pt neurologically intact. Suspect flare-up of chronic back pain. Will treat with non-opiate pain medications and have pt follow up with back specialist as scheduled on 4/30. MRI from 2 days ago read as above, no emergent findings to suggest acute process. Will prescribe NSAIDs and muscle relaxers for symptomatic relief pending follow up. Pt given strict ED return precautions, all questions answered, and stable for discharge.    Clinical Impression:  1. Chronic bilateral low back pain with bilateral sciatica      Data Unavailable           Final Clinical Impression(s) / ED Diagnoses Final diagnoses:  Chronic bilateral low back pain with bilateral sciatica    Rx / DC Orders ED Discharge Orders          Ordered    cyclobenzaprine (FLEXERIL) 10 MG tablet  2 times daily PRN        02/14/23 1413    ibuprofen (ADVIL) 600 MG tablet  Every 8 hours PRN        02/14/23 1413    lidocaine (LIDODERM) 5 %  Every 24 hours        02/14/23 1413               Sevyn Markham, Lawrence Marseilles, PA-C 02/14/23 1416    Eber Hong, MD 02/15/23 [phone]

## 2023-02-14 NOTE — Discharge Instructions (Signed)
Thank you for letting us take care of you today.  We reviewed your MRI and do not see any emergent findings on it. Please follow up with your back specialist as scheduled on 4/30. While waiting, I am prescribing a short course of muscle relaxers, NSAIDs, lidocaine patches to help you with symptom control.  For any new or worsening symptoms such as injury, loss of bowel or bladder control, fever, abdominal pain, or other new, concerning symptoms, please return to nearest emergency department for re-evaluation.

## 2023-03-06 DIAGNOSIS — M5440 Lumbago with sciatica, unspecified side: Secondary | ICD-10-CM | POA: Diagnosis not present

## 2023-03-06 DIAGNOSIS — Z7409 Other reduced mobility: Secondary | ICD-10-CM | POA: Diagnosis not present

## 2023-03-06 DIAGNOSIS — Z79899 Other long term (current) drug therapy: Secondary | ICD-10-CM | POA: Diagnosis not present

## 2023-03-06 DIAGNOSIS — R202 Paresthesia of skin: Secondary | ICD-10-CM | POA: Diagnosis not present

## 2023-03-06 DIAGNOSIS — M5459 Other low back pain: Secondary | ICD-10-CM | POA: Diagnosis not present

## 2023-03-06 DIAGNOSIS — M5136 Other intervertebral disc degeneration, lumbar region: Secondary | ICD-10-CM | POA: Diagnosis not present

## 2023-03-06 DIAGNOSIS — I82501 Chronic embolism and thrombosis of unspecified deep veins of right lower extremity: Secondary | ICD-10-CM | POA: Diagnosis not present

## 2023-03-06 DIAGNOSIS — R6889 Other general symptoms and signs: Secondary | ICD-10-CM | POA: Diagnosis not present

## 2023-03-06 DIAGNOSIS — M542 Cervicalgia: Secondary | ICD-10-CM | POA: Diagnosis not present

## 2023-03-24 DIAGNOSIS — F315 Bipolar disorder, current episode depressed, severe, with psychotic features: Secondary | ICD-10-CM | POA: Diagnosis not present

## 2023-04-28 ENCOUNTER — Ambulatory Visit: Payer: Medicaid Other | Admitting: Family Medicine

## 2023-05-22 DIAGNOSIS — Z419 Encounter for procedure for purposes other than remedying health state, unspecified: Secondary | ICD-10-CM | POA: Diagnosis not present

## 2023-05-26 ENCOUNTER — Ambulatory Visit: Payer: MEDICAID | Admitting: Family Medicine

## 2023-05-28 ENCOUNTER — Encounter: Payer: Self-pay | Admitting: Adult Health

## 2023-06-10 ENCOUNTER — Emergency Department (HOSPITAL_COMMUNITY): Payer: Medicaid Other

## 2023-06-10 ENCOUNTER — Encounter (HOSPITAL_COMMUNITY): Payer: Self-pay | Admitting: Emergency Medicine

## 2023-06-10 ENCOUNTER — Other Ambulatory Visit: Payer: Self-pay

## 2023-06-10 ENCOUNTER — Emergency Department (HOSPITAL_COMMUNITY)
Admission: EM | Admit: 2023-06-10 | Discharge: 2023-06-10 | Disposition: A | Discharge status: Home / Self Care | Payer: Medicaid Other | Attending: Emergency Medicine | Admitting: Emergency Medicine

## 2023-06-10 DIAGNOSIS — R0981 Nasal congestion: Secondary | ICD-10-CM | POA: Diagnosis present

## 2023-06-10 DIAGNOSIS — M5441 Lumbago with sciatica, right side: Secondary | ICD-10-CM | POA: Diagnosis not present

## 2023-06-10 DIAGNOSIS — M79662 Pain in left lower leg: Secondary | ICD-10-CM | POA: Diagnosis not present

## 2023-06-10 DIAGNOSIS — G8929 Other chronic pain: Secondary | ICD-10-CM | POA: Diagnosis not present

## 2023-06-10 DIAGNOSIS — U071 COVID-19: Secondary | ICD-10-CM | POA: Diagnosis not present

## 2023-06-10 DIAGNOSIS — Z7901 Long term (current) use of anticoagulants: Secondary | ICD-10-CM | POA: Diagnosis not present

## 2023-06-10 DIAGNOSIS — I10 Essential (primary) hypertension: Secondary | ICD-10-CM | POA: Diagnosis not present

## 2023-06-10 DIAGNOSIS — M5431 Sciatica, right side: Secondary | ICD-10-CM

## 2023-06-10 LAB — BASIC METABOLIC PANEL
Anion gap: 7 (ref 5–15)
BUN: 10 mg/dL (ref 6–20)
CO2: 28 mmol/L (ref 22–32)
Calcium: 8.7 mg/dL — ABNORMAL LOW (ref 8.9–10.3)
Chloride: 98 mmol/L (ref 98–111)
Creatinine, Ser: 1.11 mg/dL (ref 0.61–1.24)
GFR, Estimated: >60 mL/min (ref >60)
Glucose, Bld: 77 mg/dL (ref 70–99)
Potassium: 4.2 mmol/L (ref 3.5–5.1)
Sodium: 133 mmol/L — ABNORMAL LOW (ref 135–145)

## 2023-06-10 LAB — SARS CORONAVIRUS 2 BY RT PCR: SARS Coronavirus 2 by RT PCR: POSITIVE — AB

## 2023-06-10 LAB — CBC
HCT: 41.1 % (ref 39.0–52.0)
Hemoglobin: 14.5 g/dL (ref 13.0–17.0)
MCH: 34 pg (ref 26.0–34.0)
MCHC: 35.3 g/dL (ref 30.0–36.0)
MCV: 96.3 fL (ref 80.0–100.0)
Platelets: 229 10*3/uL (ref 150–400)
RBC: 4.27 MIL/uL (ref 4.22–5.81)
RDW: 11.9 % (ref 11.5–15.5)
WBC: 8.7 10*3/uL (ref 4.0–10.5)
nRBC: 0 % (ref 0.0–0.2)

## 2023-06-10 MED ORDER — METHOCARBAMOL 500 MG PO TABS: 500.0000 mg | ORAL_TABLET | Freq: Three times a day (TID) | ORAL | 0 refills | Status: DC

## 2023-06-10 MED ORDER — OXYCODONE-ACETAMINOPHEN 5-325 MG PO TABS
1.0000 | ORAL_TABLET | Freq: Once | ORAL | Status: AC
  Administered 2023-06-10: 1 via ORAL
  Filled 2023-06-10: qty 1

## 2023-06-10 MED ORDER — METHOCARBAMOL 500 MG PO TABS
500.0000 mg | ORAL_TABLET | Freq: Once | ORAL | Status: AC
  Administered 2023-06-10: 500 mg via ORAL
  Filled 2023-06-10: qty 1

## 2023-06-10 NOTE — ED Provider Notes (Signed)
[place] EMERGENCY DEPARTMENT AT [facility] Provider Note   CSN: [id number] Arrival date & time: 06/10/23  1211     History  Chief Complaint  Patient presents with   Leg Pain    METE STAHLBERG is a 52 y.o. male.   Leg Pain Associated symptoms: back pain   Associated symptoms: no fever        ASHANTI PACKMAN is a 52 y.o. male hx of DVT, anticoagulated, IVC filter, HTN, chronic back pain, who presents to the Emergency Department complaining of right leg pain, low back pain, body aches.  Endorses back pain that similar to previous.  Pain radiating down his right leg.  Denies shortness of breath, swelling of his leg or recent injury.  Here because he is concerned about another blood clot.  Denies any missed doses of his eliquis.  Also having some chills, nasal congestion  Home Medications Prior to Admission medications   Medication Sig Start Date End Date Taking? Authorizing Provider  Apixaban (ELIQUIS PO) Take 5 mg by mouth 2 (two) times daily.    [provider]  HYDROcodone-acetaminophen (NORCO) 5-325 MG tablet Take 1 tablet by mouth every 6 (six) hours as needed for moderate pain. 12/06/22   Carmel Sacramento A, PA-C  methocarbamol (ROBAXIN) 500 MG tablet Take 1 tablet (500 mg total) by mouth 2 (two) times daily. 12/06/22   Carmel Sacramento A, PA-C      Allergies    Lovenox [enoxaparin sodium], Bee venom, Penicillins, Zofran [ondansetron], Other, and Sulfa antibiotics    Review of Systems   Review of Systems  Constitutional:  Positive for chills. Negative for appetite change and fever.  HENT:  Positive for congestion.   Respiratory:  Negative for cough and shortness of breath.   Cardiovascular:  Negative for chest pain, palpitations and leg swelling.  Gastrointestinal:  Negative for abdominal pain, diarrhea, nausea and vomiting.  Genitourinary:  Negative for dysuria.  Musculoskeletal:  Positive for back pain and myalgias.  Skin:  Negative for color  change and rash.  Neurological:  Negative for dizziness, weakness and headaches.  Psychiatric/Behavioral:  Negative for confusion.     Physical Exam Updated Vital Signs BP 118/70   Pulse 62   Temp 98.7 F (37.1 C)   Resp 20   Ht 5\' 11"  (1.803 m)   Wt 84 kg   SpO2 97%   BMI 25.83 kg/m  Physical Exam Vitals and nursing note reviewed.  Constitutional:      General: He is not in acute distress.    Appearance: Normal appearance. He is not toxic-appearing.  Cardiovascular:     Rate and Rhythm: Normal rate and regular rhythm.     Pulses: Normal pulses.  Pulmonary:     Effort: Pulmonary effort is normal. No respiratory distress.  Chest:     Chest wall: No tenderness.  Abdominal:     General: There is no distension.     Palpations: Abdomen is soft.     Tenderness: There is no abdominal tenderness.  Musculoskeletal:     Lumbar back: Tenderness present.     Right lower leg: No edema.     Left lower leg: No edema.     Comments: Ttp of lower lumbar spine and bilateral lumbar paraspinal muscles.    Skin:    General: Skin is warm.     Capillary Refill: Capillary refill takes less than 2 seconds.  Neurological:     General: No focal deficit present.  Mental Status: He is alert.     Cranial Nerves: Cranial nerves 2-12 are intact.     Sensory: Sensation is intact. No sensory deficit.     Motor: Motor function is intact. No weakness.     Coordination: Coordination is intact.     ED Results / Procedures / Treatments   Labs (all labs ordered are listed, but only abnormal results are displayed) Labs Reviewed  BASIC METABOLIC PANEL - Abnormal; Notable for the following components:      Result Value   Sodium 133 (*)    Calcium 8.7 (*)    All other components within normal limits  SARS CORONAVIRUS 2 BY RT PCR  CBC    EKG None  Radiology US Venous Img Lower Unilateral Left (DVT)  Result Date: 06/10/2023 CLINICAL DATA:  99497 Leg pain 40981 EXAM: LEFT LOWER EXTREMITY  VENOUS DOPPLER ULTRASOUND TECHNIQUE: Gray-scale sonography with compression, as well as color and duplex ultrasound, were performed to evaluate the deep venous system(s) from the level of the common femoral vein through the popliteal and proximal calf veins. COMPARISON:  CT venogram abdomen pelvis, 12/28/2021. Lower extremity venous duplex, 07/22/2022. FINDINGS: VENOUS Normal compressibility of the LEFT common femoral, superficial femoral, and popliteal veins, as well as the visualized calf veins. Visualized portions of profunda femoral vein and great saphenous vein unremarkable. No filling defects to suggest DVT on grayscale or color Doppler imaging. Doppler waveforms show normal direction of venous flow, normal respiratory plasticity and response to augmentation. Limited views of the contralateral common femoral vein are unremarkable. OTHER No evidence of superficial thrombophlebitis or abnormal fluid collection. Limitations: none IMPRESSION: No evidence of femoropopliteal DVT or superficial thrombophlebitis within the LEFT lower extremity. Roanna Banning, MD [facility] Radiology Electronically Signed   By: Roanna Banning M.D.   On: 06/10/2023 13:05    Procedures Procedures    Medications Ordered in ED Medications  oxyCODONE-acetaminophen (PERCOCET/ROXICET) 5-325 MG per tablet 1 tablet (has no administration in time range)  methocarbamol (ROBAXIN) tablet 500 mg (has no administration in time range)    ED Course/ Medical Decision Making/ A&P                                 Medical Decision Making Pt here with right leg pain, no swelling, low back pain, recurrent no injury.  Also having generalized body aches and congestion.  No cough or shortness of breath.  Hx of DVT and has IVC filter.  No missed doses of his anticoagulant.    I suspect acute on chronic low back pain with sciatica.  Cauda equina, spinal abscess also considered, but less likely.  I  also suspect he may have a viral process causing the URI type sx.s  will check a covid test  Amount and/or Complexity of Data Reviewed Labs: ordered.    Details: COVID test positive.  No evidence of leukocytosis, hemoglobin unremarkable, chemistries without significant derangement. Discussion of management or test interpretation with external provider(s): COVID test positive, this is likely cause of patient's myalgias.  He likely also has acute on chronic low back pain with sciatica.  No red flags on exam suggestive of cauda equina.  No recent procedures to suggest a spinal abscess.  He is ambulatory.  No focal neurodeficits on exam.  Appropriate for discharge home, agreeable to symptomatic treatment.  Have recommended Tylenol for his body aches.  Prescription written for  Robaxin, database reviewed.  Risk Prescription drug management.           Final Clinical Impression(s) / ED Diagnoses Final diagnoses:  COVID  Sciatica of right side    Rx / DC Orders ED Discharge Orders     None         Pauline Aus, PA-C 06/10/23 1519    Bethann Berkshire, MD 06/11/23 1042

## 2023-06-10 NOTE — Discharge Instructions (Addendum)
Your COVID test today was positive.  This is the likely cause of your body aches and congestion.  I recommend that you take Tylenol every 4-6 hours for body aches and/or fever.  Drink plenty of fluids.  Follow-up with your primary care provider for recheck.  Return to the emergency department for any new or worsening symptoms.

## 2023-06-10 NOTE — ED Triage Notes (Signed)
Pt arrives via EMS from home with hx of DVTs in right leg. Endorses worsening pain to leg and now whole body. Pt worried about clot in left thigh due to pain. Pt on eliquis BID, denies any missed doses.

## 2023-06-19 DIAGNOSIS — F315 Bipolar disorder, current episode depressed, severe, with psychotic features: Secondary | ICD-10-CM | POA: Diagnosis not present

## 2023-06-22 ENCOUNTER — Emergency Department (HOSPITAL_COMMUNITY): Payer: Medicaid Other

## 2023-06-22 ENCOUNTER — Encounter (HOSPITAL_COMMUNITY): Payer: Self-pay | Admitting: *Deleted

## 2023-06-22 ENCOUNTER — Other Ambulatory Visit: Payer: Self-pay

## 2023-06-22 ENCOUNTER — Emergency Department (HOSPITAL_COMMUNITY)
Admission: EM | Admit: 2023-06-22 | Discharge: 2023-06-22 | Disposition: A | Discharge status: Home / Self Care | Payer: Medicaid Other | Attending: Emergency Medicine | Admitting: Emergency Medicine

## 2023-06-22 DIAGNOSIS — Z7901 Long term (current) use of anticoagulants: Secondary | ICD-10-CM | POA: Insufficient documentation

## 2023-06-22 DIAGNOSIS — R509 Fever, unspecified: Secondary | ICD-10-CM | POA: Diagnosis not present

## 2023-06-22 DIAGNOSIS — R6889 Other general symptoms and signs: Secondary | ICD-10-CM | POA: Diagnosis not present

## 2023-06-22 DIAGNOSIS — R0602 Shortness of breath: Secondary | ICD-10-CM | POA: Diagnosis not present

## 2023-06-22 DIAGNOSIS — M549 Dorsalgia, unspecified: Secondary | ICD-10-CM | POA: Diagnosis not present

## 2023-06-22 DIAGNOSIS — M545 Low back pain, unspecified: Secondary | ICD-10-CM | POA: Insufficient documentation

## 2023-06-22 DIAGNOSIS — R0789 Other chest pain: Secondary | ICD-10-CM | POA: Diagnosis not present

## 2023-06-22 DIAGNOSIS — R079 Chest pain, unspecified: Secondary | ICD-10-CM

## 2023-06-22 DIAGNOSIS — Z419 Encounter for procedure for purposes other than remedying health state, unspecified: Secondary | ICD-10-CM | POA: Diagnosis not present

## 2023-06-22 DIAGNOSIS — M5137 Other intervertebral disc degeneration, lumbosacral region: Secondary | ICD-10-CM | POA: Diagnosis not present

## 2023-06-22 DIAGNOSIS — Z743 Need for continuous supervision: Secondary | ICD-10-CM | POA: Diagnosis not present

## 2023-06-22 LAB — CBC WITH DIFFERENTIAL/PLATELET
Abs Immature Granulocytes: 0.04 10*3/uL (ref 0.00–0.07)
Basophils Absolute: 0 10*3/uL (ref 0.0–0.1)
Basophils Relative: 1 %
Eosinophils Absolute: 0.1 10*3/uL (ref 0.0–0.5)
Eosinophils Relative: 1 %
HCT: 43.7 % (ref 39.0–52.0)
Hemoglobin: 15.4 g/dL (ref 13.0–17.0)
Immature Granulocytes: 1 %
Lymphocytes Relative: 8 %
Lymphs Abs: 0.7 10*3/uL (ref 0.7–4.0)
MCH: 32.8 pg (ref 26.0–34.0)
MCHC: 35.2 g/dL (ref 30.0–36.0)
MCV: 93 fL (ref 80.0–100.0)
Monocytes Absolute: 0.3 10*3/uL (ref 0.1–1.0)
Monocytes Relative: 4 %
Neutro Abs: 7.4 10*3/uL (ref 1.7–7.7)
Neutrophils Relative %: 85 %
Platelets: 223 10*3/uL (ref 150–400)
RBC: 4.7 MIL/uL (ref 4.22–5.81)
RDW: 11.7 % (ref 11.5–15.5)
WBC: 8.6 10*3/uL (ref 4.0–10.5)
nRBC: 0 % (ref 0.0–0.2)

## 2023-06-22 LAB — COMPREHENSIVE METABOLIC PANEL
ALT: 22 U/L (ref 0–44)
AST: 32 U/L (ref 15–41)
Albumin: 3.7 g/dL (ref 3.5–5.0)
Alkaline Phosphatase: 66 U/L (ref 38–126)
Anion gap: 11 (ref 5–15)
BUN: 16 mg/dL (ref 6–20)
CO2: 23 mmol/L (ref 22–32)
Calcium: 8.6 mg/dL — ABNORMAL LOW (ref 8.9–10.3)
Chloride: 97 mmol/L — ABNORMAL LOW (ref 98–111)
Creatinine, Ser: 1.31 mg/dL — ABNORMAL HIGH (ref 0.61–1.24)
GFR, Estimated: >60 mL/min (ref >60)
Glucose, Bld: 114 mg/dL — ABNORMAL HIGH (ref 70–99)
Potassium: 4.1 mmol/L (ref 3.5–5.1)
Sodium: 131 mmol/L — ABNORMAL LOW (ref 135–145)
Total Bilirubin: 0.9 mg/dL (ref 0.3–1.2)
Total Protein: 6.8 g/dL (ref 6.5–8.1)

## 2023-06-22 MED ORDER — OXYCODONE-ACETAMINOPHEN 5-325 MG PO TABS
1.0000 | ORAL_TABLET | Freq: Once | ORAL | Status: AC
  Administered 2023-06-22: 1 via ORAL
  Filled 2023-06-22: qty 1

## 2023-06-22 MED ORDER — KETOROLAC TROMETHAMINE 15 MG/ML IJ SOLN
15.0000 mg | Freq: Once | INTRAMUSCULAR | Status: AC
  Administered 2023-06-22: 15 mg via INTRAVENOUS
  Filled 2023-06-22: qty 1

## 2023-06-22 MED ORDER — IOHEXOL 350 MG/ML SOLN
75.0000 mL | Freq: Once | INTRAVENOUS | Status: AC | PRN
  Administered 2023-06-22: 75 mL via INTRAVENOUS

## 2023-06-22 NOTE — Discharge Instructions (Signed)
Follow up with your doctor for recheck later this week to insure you are improving. Continue your regular medications. It would help if you would stop smoking.

## 2023-06-22 NOTE — ED Provider Notes (Signed)
[place] EMERGENCY DEPARTMENT AT [facility] Provider Note   CSN: [id number] Arrival date & time: 06/22/23  1418     History  Chief Complaint  Patient presents with   Shortness of Breath    Lance Cohen is a 52 y.o. male.  Patient to ED with complaint of chest pain and shortness of breath, diagnosis of COVID on 06/10/23, reporting symptoms are persistent and worsening since initial diagnosis. He reports post-tussive vomiting, infrequent. No abdominal pain or diarrhea. He complains of low back pain that is similar to his chronic low back pain but worse. No bowel/bladder dysfunction. No radiation of the back pain into the legs. He is unsure if he has had a fever at home. He continue to smoke.   The history is provided by the patient. No language interpreter was used.  Shortness of Breath      Home Medications Prior to Admission medications   Medication Sig Start Date End Date Taking? Authorizing Provider  Apixaban (ELIQUIS PO) Take 5 mg by mouth 2 (two) times daily.    [provider]  HYDROcodone-acetaminophen (NORCO) 5-325 MG tablet Take 1 tablet by mouth every 6 (six) hours as needed for moderate pain. 12/06/22   Carmel Sacramento A, PA-C  methocarbamol (ROBAXIN) 500 MG tablet Take 1 tablet (500 mg total) by mouth 3 (three) times daily. 06/10/23   Triplett, Tammy, PA-C      Allergies    Lovenox [enoxaparin sodium], Bee venom, Penicillins, Zofran [ondansetron], Other, and Sulfa antibiotics    Review of Systems   Review of Systems  Respiratory:  Positive for shortness of breath.     Physical Exam Updated Vital Signs BP (!) 110/57   Pulse 84   Temp (!) 100.4 F (38 C) (Oral)   Resp 18   Ht 5\' 11"  (1.803 m)   Wt 81.6 kg   SpO2 98%   BMI 25.10 kg/m  Physical Exam Vitals and nursing note reviewed.  Constitutional:      Appearance: He is well-developed.  Pulmonary:     Effort: Pulmonary effort is normal.     Breath sounds: Normal breath sounds.  No wheezing, rhonchi or rales.     Comments: Actively coughing on exam.  Chest:     Chest wall: Tenderness present.  Abdominal:     Palpations: Abdomen is soft.  Musculoskeletal:        General: Normal range of motion.  Skin:    General: Skin is warm and dry.  Neurological:     Mental Status: He is alert and oriented to person, place, and time.     ED Results / Procedures / Treatments   Labs (all labs ordered are listed, but only abnormal results are displayed) Labs Reviewed  COMPREHENSIVE METABOLIC PANEL - Abnormal; Notable for the following components:      Result Value   Sodium 131 (*)    Chloride 97 (*)    Glucose, Bld 114 (*)    Creatinine, Ser 1.31 (*)    Calcium 8.6 (*)    All other components within normal limits  CBC WITH DIFFERENTIAL/PLATELET   Results for orders placed or performed during the hospital encounter of 06/22/23 (from the past 24 hour(s))  CBC with Differential     Status: None   Collection Time: 06/22/23  4:47 PM  Result Value Ref Range   WBC 8.6 4.0 - 10.5 K/uL   RBC 4.70 4.22 - 5.81 MIL/uL   Hemoglobin 15.4 13.0 - 17.0  g/dL   HCT 64.4 03.4 - 74.2 %   MCV 93.0 80.0 - 100.0 fL   MCH 32.8 26.0 - 34.0 pg   MCHC 35.2 30.0 - 36.0 g/dL   RDW 59.5 63.8 - 75.6 %   Platelets 223 150 - 400 K/uL   nRBC 0.0 0.0 - 0.2 %   Neutrophils Relative % 85 %   Neutro Abs 7.4 1.7 - 7.7 K/uL   Lymphocytes Relative 8 %   Lymphs Abs 0.7 0.7 - 4.0 K/uL   Monocytes Relative 4 %   Monocytes Absolute 0.3 0.1 - 1.0 K/uL   Eosinophils Relative 1 %   Eosinophils Absolute 0.1 0.0 - 0.5 K/uL   Basophils Relative 1 %   Basophils Absolute 0.0 0.0 - 0.1 K/uL   Immature Granulocytes 1 %   Abs Immature Granulocytes 0.04 0.00 - 0.07 K/uL  Comprehensive metabolic panel     Status: Abnormal   Collection Time: 06/22/23  4:47 PM  Result Value Ref Range   Sodium 131 (L) 135 - 145 mmol/L   Potassium 4.1 3.5 - 5.1 mmol/L   Chloride 97 (L) 98 - 111 mmol/L   CO2 23 22 - 32 mmol/L    Glucose, Bld 114 (H) 70 - 99 mg/dL   BUN 16 6 - 20 mg/dL   Creatinine, Ser 4.33 (H) 0.61 - 1.24 mg/dL   Calcium 8.6 (L) 8.9 - 10.3 mg/dL   Total Protein 6.8 6.5 - 8.1 g/dL   Albumin 3.7 3.5 - 5.0 g/dL   AST 32 15 - 41 U/L   ALT 22 0 - 44 U/L   Alkaline Phosphatase 66 38 - 126 U/L   Total Bilirubin 0.9 0.3 - 1.2 mg/dL   GFR, Estimated >29 >51 mL/min   Anion gap 11 5 - 15     EKG None  Radiology CT Angio Chest PE W and/or Wo Contrast  Result Date: 06/22/2023 CLINICAL DATA:  Back pain and shortness of breath. Generalized chest pain. Recent diagnosis of COVID. PE suspected. EXAM: CT ANGIOGRAPHY CHEST WITH CONTRAST TECHNIQUE: Multidetector CT imaging of the chest was performed using the standard protocol during bolus administration of intravenous contrast. Multiplanar CT image reconstructions and MIPs were obtained to evaluate the vascular anatomy. RADIATION DOSE REDUCTION: This exam was performed according to the departmental dose-optimization program which includes automated exposure control, adjustment of the mA and/or kV according to patient size and/or use of iterative reconstruction technique. CONTRAST:  75mL OMNIPAQUE IOHEXOL 350 MG/ML SOLN COMPARISON:  Same day chest radiograph and CTA chest 06/16/2021 FINDINGS: Cardiovascular: The segmental and subsegmental pulmonary arteries are mildly degraded by respiratory motion in the lung bases. No evidence of pulmonary embolism. Normal heart size. No pericardial effusion. No aortic aneurysm or dissection. Mediastinum/Nodes: Trachea and esophagus are unremarkable. No thoracic adenopathy Lungs/Pleura: No focal consolidation, pleural effusion, or pneumothorax. Upper Abdomen: No acute abnormality. Musculoskeletal: No acute fracture. Review of the MIP images confirms the above findings. IMPRESSION: Negative for acute pulmonary embolism. No acute abnormality in the chest. Electronically Signed   By: Minerva Fester M.D.   On: 06/22/2023 18:09   DG  Chest 2 View  Result Date: 06/22/2023 CLINICAL DATA:  Generalized chest pain for 2 weeks with shortness of breath. Recent COVID-19 infection. EXAM: CHEST - 2 VIEW COMPARISON:  12/10/2020 and 06/16/2021.  CT 06/16/2021. FINDINGS: Lordotic positioning. The heart size and mediastinal contours are stable. The lungs are clear. There is no pleural effusion or pneumothorax. No acute osseous findings are  identified. Stable mild degenerative changes in the thoracic spine. IMPRESSION: No active cardiopulmonary process. Electronically Signed   By: Carey Bullocks M.D.   On: 06/22/2023 16:56    Procedures Procedures    Medications Ordered in ED Medications  oxyCODONE-acetaminophen (PERCOCET/ROXICET) 5-325 MG per tablet 1 tablet (1 tablet Oral Given 06/22/23 1640)  iohexol (OMNIPAQUE) 350 MG/ML injection 75 mL (75 mLs Intravenous Contrast Given 06/22/23 1731)  oxyCODONE-acetaminophen (PERCOCET/ROXICET) 5-325 MG per tablet 1 tablet (1 tablet Oral Given 06/22/23 1825)  ketorolac (TORADOL) 15 MG/ML injection 15 mg (15 mg Intravenous Given 06/22/23 1825)    ED Course/ Medical Decision Making/ A&P Clinical Course as of 06/22/23 Valene Bors Jun 22, 2023  1629 Patient with recent dx of COVID, here with persistent/worsening chest pain and SOB. History of PE/DVT, on Eliquis. Consider CT given symptoms. Will review CXR.  [SU]  1856 CTA negative for PE. He appears more comfortable on reassessment. He is comfortable with discharge home. Strongly encouraged PCP follow up if symptoms persist through the week.  [SU]    Clinical Course User Index [SU] Elpidio Anis, PA-C                                 Medical Decision Making Amount and/or Complexity of Data Reviewed Labs: ordered. Radiology: ordered.  Risk Prescription drug management.           Final Clinical Impression(s) / ED Diagnoses Final diagnoses:  Nonspecific chest pain    Rx / DC Orders ED Discharge Orders     None         Elpidio Anis, PA-C 06/22/23 1903    Vanetta Mulders, MD 06/22/23 2329

## 2023-06-22 NOTE — ED Triage Notes (Signed)
Pt BIB EMS for c/o back pain and SOB.  Pt also c/o generalized chest pain since 8/20 when seen and was dx'd with Covid.

## 2023-06-23 DIAGNOSIS — M5137 Other intervertebral disc degeneration, lumbosacral region: Secondary | ICD-10-CM | POA: Diagnosis not present

## 2023-06-24 DIAGNOSIS — M5137 Other intervertebral disc degeneration, lumbosacral region: Secondary | ICD-10-CM | POA: Diagnosis not present

## 2023-06-24 DIAGNOSIS — Z7689 Persons encountering health services in other specified circumstances: Secondary | ICD-10-CM | POA: Diagnosis not present

## 2023-06-25 DIAGNOSIS — M5137 Other intervertebral disc degeneration, lumbosacral region: Secondary | ICD-10-CM | POA: Diagnosis not present

## 2023-06-26 DIAGNOSIS — M5137 Other intervertebral disc degeneration, lumbosacral region: Secondary | ICD-10-CM | POA: Diagnosis not present

## 2023-06-27 DIAGNOSIS — M5137 Other intervertebral disc degeneration, lumbosacral region: Secondary | ICD-10-CM | POA: Diagnosis not present

## 2023-06-28 DIAGNOSIS — M5137 Other intervertebral disc degeneration, lumbosacral region: Secondary | ICD-10-CM | POA: Diagnosis not present

## 2023-06-29 DIAGNOSIS — M5137 Other intervertebral disc degeneration, lumbosacral region: Secondary | ICD-10-CM | POA: Diagnosis not present

## 2023-06-30 DIAGNOSIS — M5137 Other intervertebral disc degeneration, lumbosacral region: Secondary | ICD-10-CM | POA: Diagnosis not present

## 2023-07-01 DIAGNOSIS — M5137 Other intervertebral disc degeneration, lumbosacral region: Secondary | ICD-10-CM | POA: Diagnosis not present

## 2023-07-01 DIAGNOSIS — Z7689 Persons encountering health services in other specified circumstances: Secondary | ICD-10-CM | POA: Diagnosis not present

## 2023-07-01 DIAGNOSIS — M4726 Other spondylosis with radiculopathy, lumbar region: Secondary | ICD-10-CM | POA: Diagnosis not present

## 2023-07-01 DIAGNOSIS — M5136 Other intervertebral disc degeneration, lumbar region: Secondary | ICD-10-CM | POA: Diagnosis not present

## 2023-07-02 DIAGNOSIS — M5137 Other intervertebral disc degeneration, lumbosacral region: Secondary | ICD-10-CM | POA: Diagnosis not present

## 2023-07-03 DIAGNOSIS — M5137 Other intervertebral disc degeneration, lumbosacral region: Secondary | ICD-10-CM | POA: Diagnosis not present

## 2023-07-04 DIAGNOSIS — M5137 Other intervertebral disc degeneration, lumbosacral region: Secondary | ICD-10-CM | POA: Diagnosis not present

## 2023-07-05 DIAGNOSIS — M5137 Other intervertebral disc degeneration, lumbosacral region: Secondary | ICD-10-CM | POA: Diagnosis not present

## 2023-07-06 DIAGNOSIS — M5137 Other intervertebral disc degeneration, lumbosacral region: Secondary | ICD-10-CM | POA: Diagnosis not present

## 2023-07-07 DIAGNOSIS — M5137 Other intervertebral disc degeneration, lumbosacral region: Secondary | ICD-10-CM | POA: Diagnosis not present

## 2023-07-08 DIAGNOSIS — M5137 Other intervertebral disc degeneration, lumbosacral region: Secondary | ICD-10-CM | POA: Diagnosis not present

## 2023-07-09 DIAGNOSIS — M5137 Other intervertebral disc degeneration, lumbosacral region: Secondary | ICD-10-CM | POA: Diagnosis not present

## 2023-07-10 ENCOUNTER — Ambulatory Visit: Payer: Medicaid Other | Admitting: Family Medicine

## 2023-07-10 DIAGNOSIS — M5137 Other intervertebral disc degeneration, lumbosacral region: Secondary | ICD-10-CM | POA: Diagnosis not present

## 2023-07-11 ENCOUNTER — Other Ambulatory Visit: Payer: Self-pay | Admitting: Neurosurgery

## 2023-07-11 DIAGNOSIS — M5137 Other intervertebral disc degeneration, lumbosacral region: Secondary | ICD-10-CM | POA: Diagnosis not present

## 2023-07-12 DIAGNOSIS — M5137 Other intervertebral disc degeneration, lumbosacral region: Secondary | ICD-10-CM | POA: Diagnosis not present

## 2023-07-13 DIAGNOSIS — M5137 Other intervertebral disc degeneration, lumbosacral region: Secondary | ICD-10-CM | POA: Diagnosis not present

## 2023-07-14 DIAGNOSIS — M5137 Other intervertebral disc degeneration, lumbosacral region: Secondary | ICD-10-CM | POA: Diagnosis not present

## 2023-07-15 DIAGNOSIS — M5137 Other intervertebral disc degeneration, lumbosacral region: Secondary | ICD-10-CM | POA: Diagnosis not present

## 2023-07-16 DIAGNOSIS — M5137 Other intervertebral disc degeneration, lumbosacral region: Secondary | ICD-10-CM | POA: Diagnosis not present

## 2023-07-17 DIAGNOSIS — M5137 Other intervertebral disc degeneration, lumbosacral region: Secondary | ICD-10-CM | POA: Diagnosis not present

## 2023-07-18 DIAGNOSIS — M5137 Other intervertebral disc degeneration, lumbosacral region: Secondary | ICD-10-CM | POA: Diagnosis not present

## 2023-07-19 DIAGNOSIS — M5137 Other intervertebral disc degeneration, lumbosacral region: Secondary | ICD-10-CM | POA: Diagnosis not present

## 2023-07-20 DIAGNOSIS — M5137 Other intervertebral disc degeneration, lumbosacral region: Secondary | ICD-10-CM | POA: Diagnosis not present

## 2023-07-21 DIAGNOSIS — M5137 Other intervertebral disc degeneration, lumbosacral region: Secondary | ICD-10-CM | POA: Diagnosis not present

## 2023-07-22 DIAGNOSIS — Z419 Encounter for procedure for purposes other than remedying health state, unspecified: Secondary | ICD-10-CM | POA: Diagnosis not present

## 2023-07-31 ENCOUNTER — Ambulatory Visit
Admission: RE | Admit: 2023-07-31 | Discharge: 2023-07-31 | Disposition: A | Discharge status: Home / Self Care | Payer: Medicaid Other | Source: Ambulatory Visit | Attending: Neurosurgery | Admitting: Neurosurgery

## 2023-07-31 ENCOUNTER — Other Ambulatory Visit: Payer: Medicaid Other

## 2023-07-31 DIAGNOSIS — M5126 Other intervertebral disc displacement, lumbar region: Secondary | ICD-10-CM | POA: Diagnosis not present

## 2023-07-31 DIAGNOSIS — M51379 Other intervertebral disc degeneration, lumbosacral region without mention of lumbar back pain or lower extremity pain: Secondary | ICD-10-CM | POA: Diagnosis not present

## 2023-07-31 DIAGNOSIS — Z7689 Persons encountering health services in other specified circumstances: Secondary | ICD-10-CM | POA: Diagnosis not present

## 2023-08-14 ENCOUNTER — Encounter: Payer: Self-pay | Admitting: Internal Medicine

## 2023-08-14 ENCOUNTER — Ambulatory Visit (INDEPENDENT_AMBULATORY_CARE_PROVIDER_SITE_OTHER): Payer: Medicaid Other | Admitting: Internal Medicine

## 2023-08-14 VITALS — BP 124/70 | HR 71 | Ht 71.0 in | Wt 180.0 lb

## 2023-08-14 DIAGNOSIS — M5417 Radiculopathy, lumbosacral region: Secondary | ICD-10-CM | POA: Diagnosis not present

## 2023-08-14 DIAGNOSIS — Z1159 Encounter for screening for other viral diseases: Secondary | ICD-10-CM

## 2023-08-14 DIAGNOSIS — I82511 Chronic embolism and thrombosis of right femoral vein: Secondary | ICD-10-CM

## 2023-08-14 DIAGNOSIS — E782 Mixed hyperlipidemia: Secondary | ICD-10-CM | POA: Insufficient documentation

## 2023-08-14 DIAGNOSIS — Z0001 Encounter for general adult medical examination with abnormal findings: Secondary | ICD-10-CM | POA: Insufficient documentation

## 2023-08-14 DIAGNOSIS — F319 Bipolar disorder, unspecified: Secondary | ICD-10-CM

## 2023-08-14 DIAGNOSIS — F1910 Other psychoactive substance abuse, uncomplicated: Secondary | ICD-10-CM

## 2023-08-14 DIAGNOSIS — K649 Unspecified hemorrhoids: Secondary | ICD-10-CM | POA: Diagnosis not present

## 2023-08-14 DIAGNOSIS — K625 Hemorrhage of anus and rectum: Secondary | ICD-10-CM | POA: Diagnosis not present

## 2023-08-14 DIAGNOSIS — Z720 Tobacco use: Secondary | ICD-10-CM

## 2023-08-14 MED ORDER — APIXABAN 5 MG PO TABS: 5.0000 mg | ORAL_TABLET | Freq: Two times a day (BID) | ORAL | 3 refills | Status: DC

## 2023-08-14 MED ORDER — BUPROPION HCL ER (XL) 150 MG PO TB24
150.0000 mg | ORAL_TABLET | Freq: Every day | ORAL | 3 refills | Status: DC
Start: 2023-08-14 — End: 2023-12-15

## 2023-08-14 NOTE — Assessment & Plan Note (Signed)
Has chronic low back pain Followed by Dr. Franky Macho at [facility] Was on gabapentin and chronic opioids, would avoid controlled medicines considering his polysubstance abuse history

## 2023-08-14 NOTE — Assessment & Plan Note (Signed)
Uncontrolled, currently not on any treatment Used to see [facility] and later [facility] psychiatry He is unable to do virtual visits and due to lack of in person psychiatry locally, started with Wellbutrin for now-can also help with tobacco cessation Will try to involve managed Medicaid team to help with necessary resources so he can avail virtual visit

## 2023-08-14 NOTE — Progress Notes (Signed)
New Patient Office Visit  Subjective:  Patient ID: Lance Cohen, male    DOB: 1970-12-29  Age: 52 y.o. MRN: [id number]  CC:  Chief Complaint  Patient presents with   Establish Care    HPI Lance Cohen is a 52 y.o. male with past medical history of bipolar disorder, polysubstance abuse, chronic DVT, cervical and lumbar radiculopathy and tobacco abuse who presents for establishing care.  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  He has had vascular surgery and evaluation and was advised indefinite AC.  He is currently taking Eliquis for it.  Bipolar disorder: He used to see [facility] and has tried multiple SSRIs and other antipsychotics in the past, details unknown.  He has severe anxiety spells at times.  He later saw [facility] psychiatry, but could not keep follow-up due to virtual visit-he says that he does not have data plan in his phone to do virtual visits.  Denies any SI or HI currently.  Polysubstance abuse: Chart review suggests history of cocaine abuse.  He also reports taking raw mushroom (hallucinogen) for back pain.  He takes alcohol when he has money, but denies drinking alcohol every day.  He smokes 0.5 pack/day.  Denies dyspnea or wheezing currently.  Chronic neck and back pain: He is followed by Dr. Franky Macho at [facility].  He was prescribed gabapentin by previous PCP, but he stopped taking it as it was not helpful.  Hematochezia: He reports intermittent rectal bleeding.  He has history of internal hemorrhoids.  Denies any rectal pain currently.  Denies melena. Hb was 15.4 in 09/24.  Past Medical History:  Diagnosis Date   Anxiety    Arthritis    Asthma    Bipolar 1 disorder (HCC)    Bradycardia    Chronic knee pain    Depression    Dilated aortic root (HCC)    DVT (deep venous thrombosis) (HCC)    Dysrhythmia    History of kidney stones    Hypertension    Insomnia    Sinus bradycardia    Sleep apnea     Spinal stenosis    Spinal stenosis    Suicide attempt (HCC)    2010 Intentional overdose attempt with Depakote.    Past Surgical History:  Procedure Laterality Date   COLONOSCOPY WITH PROPOFOL N/A 03/18/2019   Procedure: COLONOSCOPY WITH PROPOFOL;  Surgeon: Corbin Ade, MD;  Location: [facility];  Service: Endoscopy;  Laterality: N/A;  2:30pm   FOOT SURGERY     had peice of wire removed   IVC FILTER REMOVAL N/A 02/24/2017   Procedure: IVC Filter Removal;  Surgeon: Annice Needy, MD;  Location: [facility] INVASIVE CV LAB;  Service: Cardiovascular;  Laterality: N/A;   ORIF RADIAL FRACTURE Right 12/05/2020   Procedure: OPEN REDUCTION INTERNAL FIXATION (ORIF) RADIAL FRACTURE and ULNA FRACTURE;  Surgeon: Vickki Hearing, MD;  Location: [facility] ORS;  Service: Orthopedics;  Laterality: Right;   PERIPHERAL VASCULAR CATHETERIZATION Right 10/22/2016   Procedure: Thrombectomy, thrombolyisis;  Surgeon: Renford Dills, MD;  Location: [facility] INVASIVE CV LAB;  Service: Cardiovascular;  Laterality: Right;   PERIPHERAL VASCULAR CATHETERIZATION N/A 10/22/2016   Procedure: IVC Filter Insertion;  Surgeon: Renford Dills, MD;  Location: [facility] INVASIVE CV LAB;  Service: Cardiovascular;  Laterality: N/A;   POLYPECTOMY  03/18/2019   Procedure: POLYPECTOMY;  Surgeon: Corbin Ade, MD;  Location: [facility];  Service: Endoscopy;;  Family History  Problem Relation Age of Onset   Cancer Mother    Heart attack Father    Colon cancer Neg Hx     Social History   Socioeconomic History   Marital status: Married    Spouse name: Not on file   Number of children: Not on file   Years of education: Not on file   Highest education level: Not on file  Occupational History   Not on file  Tobacco Use   Smoking status: Every Day    Current packs/day: 0.50    Types: Cigarettes   Smokeless tobacco: Never  Vaping Use   Vaping status: Never Used  Substance and Sexual Activity   Alcohol use: Yes    Comment:  seldom   Drug use: Yes    Types: Marijuana, Cocaine    Comment: smokes for pain. Cocaine + UDS 2023 pt thinks marijuana was laced. Denies cocaine use.   Sexual activity: Yes  Other Topics Concern   Not on file  Social History Narrative   Not on file   Social Determinants of Health   Financial Resource Strain: Not on file  Food Insecurity: Food Insecurity Present (02/27/2021)   Received from [facility], [facility]   Hunger Vital Sign    Worried About Running Out of Food in the Last Year: Sometimes true    Ran Out of Food in the Last Year: Often true  Transportation Needs: Not on file  Physical Activity: Not on file  Stress: Not on file  Social Connections: Unknown (03/05/2022)   Received from [facility], [facility]   Social Network    Social Network: Not on file  Intimate Partner Violence: Unknown (01/25/2022)   Received from [facility], [facility]   HITS    Physically Hurt: Not on file    Insult or Talk Down To: Not on file    Threaten Physical Harm: Not on file    Scream or Curse: Not on file    ROS Review of Systems  Constitutional:  Negative for chills and fever.  HENT:  Negative for congestion and sore throat.   Eyes:  Negative for pain and discharge.  Respiratory:  Negative for cough and shortness of breath.   Cardiovascular:  Negative for chest pain and palpitations.  Gastrointestinal:  Positive for blood in stool. Negative for diarrhea, nausea and vomiting.  Endocrine: Negative for polydipsia and polyuria.  Genitourinary:  Negative for dysuria and hematuria.  Musculoskeletal:  Positive for neck pain and neck stiffness.  Skin:  Negative for rash.  Neurological:  Negative for dizziness and weakness.  Psychiatric/Behavioral:  Positive for agitation, decreased concentration and sleep disturbance. Negative for behavioral problems. The patient is nervous/anxious.     Objective:   Today's Vitals: BP 124/70 (BP Location: Right Arm)   Pulse 71   Ht 5'  11" (1.803 m)   Wt 180 lb (81.6 kg)   SpO2 97%   BMI 25.10 kg/m   Physical Exam Vitals reviewed.  Constitutional:      General: He is not in acute distress.    Appearance: He is not diaphoretic.  HENT:     Head: Normocephalic and atraumatic.     Nose: Nose normal.     Mouth/Throat:     Mouth: Mucous membranes are moist.  Eyes:     General: No scleral icterus.    Extraocular Movements: Extraocular movements intact.  Cardiovascular:     Rate and Rhythm: Normal rate and regular rhythm.  Heart sounds: Normal heart sounds. No murmur heard. Pulmonary:     Breath sounds: Normal breath sounds. No wheezing or rales.  Abdominal:     Palpations: Abdomen is soft.     Tenderness: There is no abdominal tenderness.  Musculoskeletal:     Cervical back: Neck supple. No tenderness.     Right lower leg: No edema.     Left lower leg: No edema.  Skin:    General: Skin is warm.     Findings: No rash.  Neurological:     General: No focal deficit present.     Mental Status: He is alert and oriented to person, place, and time.  Psychiatric:        Mood and Affect: Mood is anxious.        Behavior: Behavior is cooperative.     Assessment & Plan:   Problem List Items Addressed This Visit       Cardiovascular and Mediastinum   Chronic deep vein thrombosis (DVT) (HCC) - Primary    History of unprovoked DVT Continue Eliquis      Relevant Medications   apixaban (ELIQUIS) 5 MG TABS tablet   Hemorrhoids    Reports episodes of hematochezia, could be due to hemorrhoids Referred to GI      Relevant Medications   apixaban (ELIQUIS) 5 MG TABS tablet   Other Relevant Orders   Ambulatory referral to Gastroenterology     Digestive   Rectal bleeding    Likely due to hemorrhoids Referred to GI for further evaluation        Nervous and Auditory   Lumbosacral radiculopathy    Has chronic low back pain Followed by Dr. Franky Macho at [facility] Was on gabapentin and chronic  opioids, would avoid controlled medicines considering his polysubstance abuse history      Relevant Medications   buPROPion (WELLBUTRIN XL) 150 MG 24 hr tablet     Other   Bipolar 1 disorder (HCC) (Chronic)    Uncontrolled, currently not on any treatment Used to see [facility] and later [facility] psychiatry He is unable to do virtual visits and due to lack of in person psychiatry locally, started with Wellbutrin for now-can also help with tobacco cessation Will try to involve managed Medicaid team to help with necessary resources so he can avail virtual visit      Relevant Medications   buPROPion (WELLBUTRIN XL) 150 MG 24 hr tablet   Other Relevant Orders   CBC with Differential/Platelet   CMP14+EGFR   TSH   Tobacco abuse (Chronic)    Smokes about 0.5 pack/day  Asked about quitting: confirms that he/she currently smokes cigarettes Advise to quit smoking: Educated about QUITTING to reduce the risk of cancer, cardio and cerebrovascular disease. Assess willingness: Unwilling to quit at this time, but is working on cutting back. Assist with counseling and pharmacotherapy: Counseled for 5 minutes and literature provided. Arrange for follow up: follow up in 3 months and continue to offer help.      Mixed hyperlipidemia   Relevant Medications   apixaban (ELIQUIS) 5 MG TABS tablet   Other Relevant Orders   Lipid Profile   Polysubstance abuse (HCC)    History of cocaine abuse and hallucinogen use (mushroom) Denies recent use of cocaine Takes alcohol occasionally, denies binge drinking Advised to refrain from using cocaine, hallucinogens or other illicit substances      Other Visit Diagnoses     Need for hepatitis C screening test  Relevant Orders   Hepatitis C Antibody       Outpatient Encounter Medications as of 08/14/2023  Medication Sig   buPROPion (WELLBUTRIN XL) 150 MG 24 hr tablet Take 1 tablet (150 mg total) by mouth daily.   [DISCONTINUED] Apixaban  (ELIQUIS PO) Take 5 mg by mouth 2 (two) times daily.   apixaban (ELIQUIS) 5 MG TABS tablet Take 1 tablet (5 mg total) by mouth 2 (two) times daily.   [DISCONTINUED] HYDROcodone-acetaminophen (NORCO) 5-325 MG tablet Take 1 tablet by mouth every 6 (six) hours as needed for moderate pain. (Patient not taking: Reported on 08/14/2023)   [DISCONTINUED] methocarbamol (ROBAXIN) 500 MG tablet Take 1 tablet (500 mg total) by mouth 3 (three) times daily. (Patient not taking: Reported on 08/14/2023)   No facility-administered encounter medications on file as of 08/14/2023.    Follow-up: Return in about 4 months (around 12/15/2023).   Anabel Halon, MD

## 2023-08-14 NOTE — Assessment & Plan Note (Signed)
Smokes about 0.5 pack/day  Asked about quitting: confirms that he/she currently smokes cigarettes Advise to quit smoking: Educated about QUITTING to reduce the risk of cancer, cardio and cerebrovascular disease. Assess willingness: Unwilling to quit at this time, but is working on cutting back. Assist with counseling and pharmacotherapy: Counseled for 5 minutes and literature provided. Arrange for follow up: follow up in 3 months and continue to offer help. 

## 2023-08-14 NOTE — Patient Instructions (Addendum)
Please start taking Wellbutrin as prescribed.  Please cut down -> quit smoking.  Please avoid other substance abuse.  Eat at regular intervals and maintain at least 64 ounces of fluid intake in a day.

## 2023-08-14 NOTE — Assessment & Plan Note (Signed)
History of unprovoked DVT Continue Eliquis

## 2023-08-14 NOTE — Assessment & Plan Note (Signed)
Reports episodes of hematochezia, could be due to hemorrhoids Referred to GI

## 2023-08-14 NOTE — Assessment & Plan Note (Signed)
Likely due to hemorrhoids Referred to GI for further evaluation

## 2023-08-14 NOTE — Assessment & Plan Note (Addendum)
History of cocaine abuse and hallucinogen use (mushroom) Denies recent use of cocaine Takes alcohol occasionally, denies binge drinking Advised to refrain from using cocaine, hallucinogens or other illicit substances

## 2023-08-15 LAB — CBC WITH DIFFERENTIAL/PLATELET
Basophils Absolute: 0.1 10*3/uL (ref 0.0–0.2)
Basos: 1 %
EOS (ABSOLUTE): 0 10*3/uL (ref 0.0–0.4)
Eos: 0 %
Hematocrit: 46.4 % (ref 37.5–51.0)
Hemoglobin: 15.5 g/dL (ref 13.0–17.7)
Immature Grans (Abs): 0 10*3/uL (ref 0.0–0.1)
Immature Granulocytes: 0 %
Lymphocytes Absolute: 2.3 10*3/uL (ref 0.7–3.1)
Lymphs: 25 %
MCH: 33.2 pg — ABNORMAL HIGH (ref 26.6–33.0)
MCHC: 33.4 g/dL (ref 31.5–35.7)
MCV: 99 fL — ABNORMAL HIGH (ref 79–97)
Monocytes Absolute: 0.5 10*3/uL (ref 0.1–0.9)
Monocytes: 6 %
Neutrophils Absolute: 6.3 10*3/uL (ref 1.4–7.0)
Neutrophils: 68 %
Platelets: 297 10*3/uL (ref 150–450)
RBC: 4.67 x10E6/uL (ref 4.14–5.80)
RDW: 13 % (ref 11.6–15.4)
WBC: 9.3 10*3/uL (ref 3.4–10.8)

## 2023-08-15 LAB — CMP14+EGFR
ALT: 10 [IU]/L (ref 0–44)
AST: 18 [IU]/L (ref 0–40)
Albumin: 4.5 g/dL (ref 3.8–4.9)
Alkaline Phosphatase: 89 [IU]/L (ref 44–121)
BUN/Creatinine Ratio: 12 (ref 9–20)
BUN: 13 mg/dL (ref 6–24)
Bilirubin Total: 0.5 mg/dL (ref 0.0–1.2)
CO2: 25 mmol/L (ref 20–29)
Calcium: 9.9 mg/dL (ref 8.7–10.2)
Chloride: 104 mmol/L (ref 96–106)
Creatinine, Ser: 1.13 mg/dL (ref 0.76–1.27)
Globulin, Total: 2.5 g/dL (ref 1.5–4.5)
Glucose: 89 mg/dL (ref 70–99)
Potassium: 4.4 mmol/L (ref 3.5–5.2)
Sodium: 141 mmol/L (ref 134–144)
Total Protein: 7 g/dL (ref 6.0–8.5)
eGFR: 79 mL/min/{1.73_m2} (ref >59)

## 2023-08-15 LAB — LIPID PANEL
Chol/HDL Ratio: 2.8 ratio (ref 0.0–5.0)
Cholesterol, Total: 145 mg/dL (ref 100–199)
HDL: 52 mg/dL (ref >39)
LDL Chol Calc (NIH): 78 mg/dL (ref 0–99)
Triglycerides: 79 mg/dL (ref 0–149)
VLDL Cholesterol Cal: 15 mg/dL (ref 5–40)

## 2023-08-15 LAB — TSH: TSH: 1.24 u[IU]/mL (ref 0.450–4.500)

## 2023-08-15 LAB — HEPATITIS C ANTIBODY: Hep C Virus Ab: NONREACTIVE

## 2023-08-18 DIAGNOSIS — Z7689 Persons encountering health services in other specified circumstances: Secondary | ICD-10-CM | POA: Diagnosis not present

## 2023-08-22 DIAGNOSIS — Z419 Encounter for procedure for purposes other than remedying health state, unspecified: Secondary | ICD-10-CM | POA: Diagnosis not present

## 2023-08-27 ENCOUNTER — Encounter: Payer: Self-pay | Admitting: Internal Medicine

## 2023-08-27 ENCOUNTER — Encounter: Payer: Self-pay | Admitting: *Deleted

## 2023-08-27 ENCOUNTER — Ambulatory Visit (INDEPENDENT_AMBULATORY_CARE_PROVIDER_SITE_OTHER): Payer: Medicaid Other | Admitting: Internal Medicine

## 2023-08-27 ENCOUNTER — Other Ambulatory Visit: Payer: Self-pay | Admitting: *Deleted

## 2023-08-27 VITALS — BP 112/67 | HR 61 | Temp 98.0°F | Ht 71.0 in | Wt 173.1 lb

## 2023-08-27 DIAGNOSIS — K625 Hemorrhage of anus and rectum: Secondary | ICD-10-CM

## 2023-08-27 DIAGNOSIS — K219 Gastro-esophageal reflux disease without esophagitis: Secondary | ICD-10-CM | POA: Diagnosis not present

## 2023-08-27 DIAGNOSIS — Z7901 Long term (current) use of anticoagulants: Secondary | ICD-10-CM | POA: Diagnosis not present

## 2023-08-27 DIAGNOSIS — K648 Other hemorrhoids: Secondary | ICD-10-CM | POA: Diagnosis not present

## 2023-08-27 MED ORDER — PEG 3350-KCL-NA BICARB-NACL 420 G PO SOLR: 4000.0000 mL | Freq: Once | ORAL | 0 refills | Status: AC

## 2023-08-27 NOTE — Patient Instructions (Signed)
We will schedule you for colonoscopy to further evaluate your rectal bleeding.  You will need to hold your Eliquis x 2 days prior to procedure.  It was very nice meeting you today.  Dr. Marletta Lor

## 2023-08-27 NOTE — Progress Notes (Signed)
Primary Care Physician:  Anabel Halon, MD Primary Gastroenterologist:  Dr. Marletta Lor  Chief Complaint  Patient presents with   Hemorrhoids    Referred for hemorrhoids. Having some bleeding. Bright red. Having dizziness.     HPI:   Lance Cohen is a 52 y.o. male who presents to clinic today by referral from his PCP Dr. Allena Katz for evaluation.  Patient complaining of rectal bleeding, occurs every 1 to 2 months.  When it does happen he reports a lot of blood both on tissue paper and in toilet bowl.  Denies any clots.  No mucus in the stool.  Does have chronic lower abdominal pain which has been ongoing for years.  Colonoscopy 528 2026 mm polyp at the splenic flexure, diverticulosis sigmoid and descending colons, nonbleeding internal hemorrhoids.  Path with tubular adenoma.  Patient denies any family history of colorectal malignancy.  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 He has had vascular surgery evaluation and was advised indefinite AC.  Previous IVC filter placement which was since removed.  Admits to occasional acid reflux.  This occurs he drinks Milk or occasionally will take Tums.  No dysphagia odynophagia.  No epigastric or chest pain.  Past Medical History:  Diagnosis Date   Anxiety    Arthritis    Asthma    Bipolar 1 disorder (HCC)    Bradycardia    Chronic knee pain    Depression    Dilated aortic root (HCC)    DVT (deep venous thrombosis) (HCC)    Dysrhythmia    History of kidney stones    Hypertension    Insomnia    Sinus bradycardia    Sleep apnea    Spinal stenosis    Spinal stenosis    Suicide attempt (HCC)    2010 Intentional overdose attempt with Depakote.    Past Surgical History:  Procedure Laterality Date   COLONOSCOPY WITH PROPOFOL N/A 03/18/2019   Procedure: COLONOSCOPY WITH PROPOFOL;  Surgeon: Corbin Ade, MD;  [facility];  Service: Endoscopy;  Laterality: N/A;  2:30pm   FOOT SURGERY     had peice  of wire removed   IVC FILTER REMOVAL N/A 02/24/2017   Procedure: IVC Filter Removal;  Surgeon: Annice Needy, MD;  Location: [facility] INVASIVE CV LAB;  Service: Cardiovascular;  Laterality: N/A;   ORIF RADIAL FRACTURE Right 12/05/2020   Procedure: OPEN REDUCTION INTERNAL FIXATION (ORIF) RADIAL FRACTURE and ULNA FRACTURE;  Surgeon: Vickki Hearing, MD;  Location: [facility];  Service: Orthopedics;  Laterality: Right;   PERIPHERAL VASCULAR CATHETERIZATION Right 10/22/2016   Procedure: Thrombectomy, thrombolyisis;  Surgeon: Renford Dills, MD;  Location: [facility] INVASIVE CV LAB;  Service: Cardiovascular;  Laterality: Right;   PERIPHERAL VASCULAR CATHETERIZATION N/A 10/22/2016   Procedure: IVC Filter Insertion;  Surgeon: Renford Dills, MD;  Location: [facility] INVASIVE CV LAB;  Service: Cardiovascular;  Laterality: N/A;   POLYPECTOMY  03/18/2019   Procedure: POLYPECTOMY;  Surgeon: Corbin Ade, MD;  [facility];  Service: Endoscopy;;    Current Outpatient Medications  Medication Sig Dispense Refill   apixaban (ELIQUIS) 5 MG TABS tablet Take 1 tablet (5 mg total) by mouth 2 (two) times daily. 60 tablet 3   buPROPion (WELLBUTRIN XL) 150 MG 24 hr tablet Take 1 tablet (150 mg total) by mouth daily. 30 tablet 3   No current facility-administered medications for this visit.    Allergies as of 08/27/2023 - Review  Complete 08/27/2023  Allergen Reaction Noted   Lovenox [enoxaparin sodium] Hives 10/21/2021   Bee venom Swelling 12/08/2013   Penicillins Other (See Comments)    Zofran [ondansetron] Hives 04/09/2021   Other Palpitations 06/11/2021   Sulfa antibiotics Itching 11/13/2013    Family History  Problem Relation Age of Onset   Cancer Mother    Heart attack Father    Colon cancer Neg Hx     Social History   Socioeconomic History   Marital status: Married    Spouse name: Not on file   Number of children: Not on file   Years of education: Not on file   Highest education level: Not  on file  Occupational History   Not on file  Tobacco Use   Smoking status: Every Day    Current packs/day: 0.50    Types: Cigarettes   Smokeless tobacco: Never  Vaping Use   Vaping status: Never Used  Substance and Sexual Activity   Alcohol use: Yes    Comment: seldom   Drug use: Yes    Types: Marijuana, Cocaine    Comment: smokes for pain. Cocaine + UDS 2023 pt thinks marijuana was laced. Denies cocaine use.   Sexual activity: Yes  Other Topics Concern   Not on file  Social History Narrative   Not on file   Social Determinants of Health   Financial Resource Strain: Not on file  Food Insecurity: Food Insecurity Present (02/27/2021)   Received from [facility], [facility]   Hunger Vital Sign    Worried About Running Out of Food in the Last Year: Sometimes true    Ran Out of Food in the Last Year: Often true  Transportation Needs: Not on file  Physical Activity: Not on file  Stress: Not on file  Social Connections: Unknown (03/05/2022)   Received from [facility], [facility]   Social Network    Social Network: Not on file  Intimate Partner Violence: Unknown (01/25/2022)   Received from [facility], [facility]   HITS    Physically Hurt: Not on file    Insult or Talk Down To: Not on file    Threaten Physical Harm: Not on file    Scream or Curse: Not on file    Subjective: Review of Systems  Constitutional:  Negative for chills and fever.  HENT:  Negative for congestion and hearing loss.   Eyes:  Negative for blurred vision and double vision.  Respiratory:  Negative for cough and shortness of breath.   Cardiovascular:  Negative for chest pain and palpitations.  Gastrointestinal:  Positive for blood in stool. Negative for abdominal pain, constipation, diarrhea, heartburn, melena and vomiting.  Genitourinary:  Negative for dysuria and urgency.  Musculoskeletal:  Negative for joint pain and myalgias.  Skin:  Negative for itching and rash.  Neurological:   Negative for dizziness and headaches.  Psychiatric/Behavioral:  Negative for depression. The patient is not nervous/anxious.        Objective: BP 112/67 (BP Location: Left Arm, Patient Position: Sitting, Cuff Size: Normal)   Pulse 61   Temp 98 F (36.7 C) (Oral)   Ht 5\' 11"  (1.803 m)   Wt 173 lb 1.6 oz (78.5 kg)   BMI 24.14 kg/m  Physical Exam Constitutional:      Appearance: Normal appearance.  HENT:     Head: Normocephalic and atraumatic.  Eyes:     Extraocular Movements: Extraocular movements intact.     Conjunctiva/sclera: Conjunctivae normal.  Cardiovascular:  Rate and Rhythm: Normal rate and regular rhythm.  Pulmonary:     Effort: Pulmonary effort is normal.     Breath sounds: Normal breath sounds.  Abdominal:     General: Bowel sounds are normal.     Palpations: Abdomen is soft.  Musculoskeletal:        General: Normal range of motion.     Cervical back: Normal range of motion and neck supple.  Skin:    General: Skin is warm.  Neurological:     General: No focal deficit present.     Mental Status: He is alert and oriented to person, place, and time.  Psychiatric:        Mood and Affect: Mood normal.        Behavior: Behavior normal.      Assessment: *Rectal bleeding *Internal hemorrhoids *Chronic anticoag *GERD-mild, intermittent  Plan: Will schedule for colonoscopy.The risks including infection, bleed, or perforation as well as benefits, limitations, alternatives and imponderables have been reviewed with the patient. Questions have been answered. All parties agreeable.  If no other identifiable cause found, can consider hemorrhoid banding.  Patient will need to hold his Eliquis x 48 hours prior to procedure.  Counseled on slight increased risk of cardiovascular event during this time and he understands and is agreeable  GERD symptoms very mild, intermittent.  Continue to monitor.    Thank you Dr. Allena Katz for the kind referral  08/27/2023 9:27  AM   Disclaimer: This note was dictated with voice recognition software. Similar sounding words can inadvertently be transcribed and may not be corrected upon review.

## 2023-08-28 ENCOUNTER — Encounter: Payer: Self-pay | Admitting: Internal Medicine

## 2023-09-15 ENCOUNTER — Encounter (HOSPITAL_COMMUNITY): Payer: Self-pay

## 2023-09-15 ENCOUNTER — Encounter (HOSPITAL_COMMUNITY)
Admission: RE | Admit: 2023-09-15 | Discharge: 2023-09-15 | Disposition: A | Discharge status: Home / Self Care | Payer: Medicaid Other | Source: Ambulatory Visit | Attending: Internal Medicine | Admitting: Internal Medicine

## 2023-09-21 DIAGNOSIS — Z419 Encounter for procedure for purposes other than remedying health state, unspecified: Secondary | ICD-10-CM | POA: Diagnosis not present

## 2023-09-22 ENCOUNTER — Encounter (HOSPITAL_COMMUNITY): Admission: RE | Payer: Self-pay | Source: Home / Self Care

## 2023-09-22 ENCOUNTER — Encounter (HOSPITAL_COMMUNITY): Payer: Self-pay | Admitting: Anesthesiology

## 2023-09-22 ENCOUNTER — Telehealth: Payer: Self-pay | Admitting: *Deleted

## 2023-09-22 ENCOUNTER — Ambulatory Visit (HOSPITAL_COMMUNITY): Admission: RE | Admit: 2023-09-22 | Payer: Medicaid Other | Source: Home / Self Care

## 2023-09-22 DIAGNOSIS — F1410 Cocaine abuse, uncomplicated: Secondary | ICD-10-CM

## 2023-09-22 SURGERY — COLONOSCOPY WITH PROPOFOL
Anesthesia: Monitor Anesthesia Care

## 2023-09-22 NOTE — Telephone Encounter (Signed)
-----   Message from Nurse Evlyn Kanner sent at 09/22/2023  2:03 PM EST ----- Regarding: No show for procedure Lance Cohen,  Mr Jourdan did not show up today for his colonoscopy.  I told him during his phone call that he would be cancelled if + for cocaine.  Thanks,  Cliffton Asters RN

## 2023-09-22 NOTE — Telephone Encounter (Signed)
Called pt and he stated he forgot about procedure to be done for today. He did want to reschedule. Advised will reschedule him once we get January schedule. He voiced understanding

## 2023-09-22 NOTE — Anesthesia Preprocedure Evaluation (Signed)
Anesthesia Evaluation  Patient identified by MRN, date of birth, ID band Patient awake    Reviewed: Allergy & Precautions, NPO status , Patient's Chart, lab work & pertinent test results  Airway Mallampati: II       Dental  (+) Missing, Poor Dentition, Dental Advisory Given   Pulmonary asthma , Current Smoker and Patient abstained from smoking.   Pulmonary exam normal breath sounds clear to auscultation       Cardiovascular Exercise Tolerance: Good hypertension, Pt. on medications Normal cardiovascular exam+ dysrhythmias I Rhythm:Regular Rate:Normal  States ET limited by R groin blood clot on thinners.  Sinus bradycardia   Neuro/Psych Seizures -,  PSYCHIATRIC DISORDERS Anxiety Depression Bipolar Disorder   Poor historian  H/o OD in 2010Spinal stenosis  Neuromuscular disease    GI/Hepatic Neg liver ROS,GERD  ,,  Endo/Other  negative endocrine ROS    Renal/GU negative Renal ROS  negative genitourinary   Musculoskeletal  (+) Arthritis , Osteoarthritis,    Abdominal   Peds negative pediatric ROS (+)  Hematology negative hematology ROS (+)   Anesthesia Other Findings MJ use when he can find it  Denies other illicit drug use  DVT history  Reproductive/Obstetrics negative OB ROS                             Anesthesia Physical Anesthesia Plan  ASA: 3  Anesthesia Plan: General   Post-op Pain Management: Minimal or no pain anticipated   Induction: Intravenous  PONV Risk Score and Plan: Propofol infusion  Airway Management Planned: Nasal Cannula and Simple Face Mask  Additional Equipment: None  Intra-op Plan:   Post-operative Plan:   Informed Consent: I have reviewed the patients History and Physical, chart, labs and discussed the procedure including the risks, benefits and alternatives for the proposed anesthesia with the patient or authorized representative who has indicated  his/her understanding and acceptance.     Dental advisory given  Plan Discussed with: CRNA  Anesthesia Plan Comments: (Plan full PPE use  Plan GA with GETA back up as needed -WTP with same )        Anesthesia Quick Evaluation

## 2023-09-30 MED ORDER — PEG 3350-KCL-NA BICARB-NACL 420 G PO SOLR: 4000.0000 mL | Freq: Once | ORAL | 0 refills | Status: AC

## 2023-09-30 NOTE — Addendum Note (Signed)
Addended by: Armstead Peaks on: 09/30/2023 08:39 AM   Modules accepted: Orders

## 2023-09-30 NOTE — Telephone Encounter (Signed)
Called pt, not able to leave VM. Letter mailed with pre-op appt.

## 2023-09-30 NOTE — Telephone Encounter (Signed)
Called pt and he has been rescheduled to 1/20. He did not pick up prep. Will send new rx. Aware will mail new instructions and call back with pre-op appt. Confirmed address and pharmacy.

## 2023-10-13 ENCOUNTER — Ambulatory Visit (INDEPENDENT_AMBULATORY_CARE_PROVIDER_SITE_OTHER): Payer: Medicaid Other | Admitting: Internal Medicine

## 2023-10-13 ENCOUNTER — Encounter: Payer: Self-pay | Admitting: Internal Medicine

## 2023-10-13 VITALS — BP 112/72 | HR 81 | Ht 71.0 in | Wt 179.0 lb

## 2023-10-13 DIAGNOSIS — M5417 Radiculopathy, lumbosacral region: Secondary | ICD-10-CM

## 2023-10-13 DIAGNOSIS — F319 Bipolar disorder, unspecified: Secondary | ICD-10-CM | POA: Diagnosis not present

## 2023-10-13 DIAGNOSIS — I82511 Chronic embolism and thrombosis of right femoral vein: Secondary | ICD-10-CM | POA: Diagnosis not present

## 2023-10-13 DIAGNOSIS — G8929 Other chronic pain: Secondary | ICD-10-CM

## 2023-10-13 DIAGNOSIS — M25511 Pain in right shoulder: Secondary | ICD-10-CM

## 2023-10-13 MED ORDER — PREDNISONE 10 MG (21) PO TBPK: ORAL_TABLET | ORAL | 0 refills | Status: DC

## 2023-10-13 NOTE — Patient Instructions (Addendum)
Please start taking Prednisone as prescribed.  Please avoid heavy lifting with left arm.  Please start taking Wellbutrin as prescribed.

## 2023-10-13 NOTE — Assessment & Plan Note (Signed)
Has chronic low back pain Followed by Dr. Franky Macho at [facility] Was on gabapentin and chronic opioids, would avoid controlled medicines considering his polysubstance abuse history

## 2023-10-13 NOTE — Progress Notes (Unsigned)
Acute Office Visit  Subjective:    Patient ID: Lance Cohen, male    DOB: [birth year], 52 y.o.   MRN: [id number]  Chief Complaint  Patient presents with   Right shoulder pain   Bipolar disorder follow-up    HPI Patient is in today for complaint of acute on chronic right shoulder pain, worse since 09/14/23.  Pain is constant, sharp, radiating to right arm and worse with abduction.  Denies any recent injury.  He reports having gout, although his previous x-ray shows osteoarthritis of the acromioclavicular joint.  He denies any history of toe or ankle swelling related to gout.  Denies any recent alcohol intake.  He has been evaluated by orthopedic surgeon in the past for similar symptoms.  Chronic neck and back pain: He is followed by Dr. Franky Macho at [facility].  He was prescribed gabapentin by previous PCP, but he stopped taking it as it was not helpful.   Bipolar disorder: He used to see [facility] and has tried multiple SSRIs and other antipsychotics in the past, details unknown.  He has severe anxiety spells at times.  He later saw [facility], but could not keep follow-up due to virtual visit-he says that he did not have data plan in his phone to do virtual visits, but agrees to see them now.  He was prescribed Wellbutrin, but did not pick up due to financial constraints.  Denies any SI or HI currently.   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  He has had vascular surgery evaluation and was advised indefinite AC.  He is currently taking Eliquis for it.   Past Medical History:  Diagnosis Date   Anxiety    Arthritis    Asthma    Bipolar 1 disorder (HCC)    Bradycardia    Chronic knee pain    Depression    Dilated aortic root (HCC)    DVT (deep venous thrombosis) (HCC)    Dysrhythmia    History of kidney stones    Hypertension    Insomnia    Sinus bradycardia    Spinal stenosis    Spinal stenosis    Suicide attempt  (HCC)    2010 Intentional overdose attempt with Depakote.    Past Surgical History:  Procedure Laterality Date   COLONOSCOPY WITH PROPOFOL N/A 03/18/2019   Procedure: COLONOSCOPY WITH PROPOFOL;  Surgeon: Corbin Ade, MD;  [facility];  Service: Endoscopy;  Laterality: N/A;  2:30pm   FOOT SURGERY     had peice of wire removed   IVC FILTER REMOVAL N/A 02/24/2017   Procedure: IVC Filter Removal;  Surgeon: Annice Needy, MD;  Location: [facility] INVASIVE CV LAB;  Service: Cardiovascular;  Laterality: N/A;   ORIF RADIAL FRACTURE Right 12/05/2020   Procedure: OPEN REDUCTION INTERNAL FIXATION (ORIF) RADIAL FRACTURE and ULNA FRACTURE;  Surgeon: Vickki Hearing, MD;  Location: [facility];  Service: Orthopedics;  Laterality: Right;   PERIPHERAL VASCULAR CATHETERIZATION Right 10/22/2016   Procedure: Thrombectomy, thrombolyisis;  Surgeon: Renford Dills, MD;  Location: [facility] INVASIVE CV LAB;  Service: Cardiovascular;  Laterality: Right;   PERIPHERAL VASCULAR CATHETERIZATION N/A 10/22/2016   Procedure: IVC Filter Insertion;  Surgeon: Renford Dills, MD;  Location: [facility] INVASIVE CV LAB;  Service: Cardiovascular;  Laterality: N/A;   POLYPECTOMY  03/18/2019   Procedure: POLYPECTOMY;  Surgeon: Corbin Ade, MD;  [facility];  Service: Endoscopy;;  Family History  Problem Relation Age of Onset   Cancer Mother    Heart attack Father    Colon cancer Neg Hx     Social History   Socioeconomic History   Marital status: Married    Spouse name: Not on file   Number of children: Not on file   Years of education: Not on file   Highest education level: Not on file  Occupational History   Not on file  Tobacco Use   Smoking status: Every Day    Current packs/day: 0.50    Types: Cigarettes   Smokeless tobacco: Never  Vaping Use   Vaping status: Never Used  Substance and Sexual Activity   Alcohol use: Yes    Comment: seldom   Drug use: Yes    Types: Marijuana, Cocaine     Comment: smokes for pain. Cocaine + UDS 2023 pt thinks marijuana was laced. Denies cocaine use.   Sexual activity: Yes  Other Topics Concern   Not on file  Social History Narrative   Not on file   Social Drivers of Health   Financial Resource Strain: Not on file  Food Insecurity: Food Insecurity Present (02/27/2021)   Received [facility], [facility]   Hunger Vital Sign    Worried About Running Out of Food in the Last Year: Sometimes true    Ran Out of Food in the Last Year: Often true  Transportation Needs: Not on file  Physical Activity: Not on file  Stress: Not on file  Social Connections: Unknown (03/05/2022)   Received from [facility], [facility]   Social Network    Social Network: Not on file  Intimate Partner Violence: Unknown (01/25/2022)   Received from [facility], [facility]   HITS    Physically Hurt: Not on file    Insult or Talk Down To: Not on file    Threaten Physical Harm: Not on file    Scream or Curse: Not on file    Outpatient Medications Prior to Visit  Medication Sig Dispense Refill   apixaban (ELIQUIS) 5 MG TABS tablet Take 1 tablet (5 mg total) by mouth 2 (two) times daily. 60 tablet 3   buPROPion (WELLBUTRIN XL) 150 MG 24 hr tablet Take 1 tablet (150 mg total) by mouth daily. 30 tablet 3   No facility-administered medications prior to visit.    Allergies  Allergen Reactions   Lovenox [Enoxaparin Sodium] Hives   Bee Venom Swelling   Penicillins Other (See Comments)    PT states that he had symptoms that felt like he was having a heart attack Has patient had a PCN reaction causing immediate rash, facial/tongue/throat swelling, SOB or lightheadedness with hypotension: No Has patient had a PCN reaction causing severe rash involving mucus membranes or skin necrosis: No Has patient had a PCN reaction that required hospitalization No Has patient had a PCN reaction occurring within the last 10 years: No If all of the above answers are  "NO", then may proceed with Cephal   Zofran [Ondansetron] Hives   Other Palpitations    Steroiod   Sulfa Antibiotics Itching    Review of Systems  Constitutional:  Negative for chills and fever.  HENT:  Negative for congestion and sore throat.   Eyes:  Negative for pain and discharge.  Respiratory:  Negative for cough and shortness of breath.   Cardiovascular:  Negative for chest pain and palpitations.  Gastrointestinal:  Positive for blood in stool. Negative for diarrhea,  nausea and vomiting.  Endocrine: Negative for polydipsia and polyuria.  Genitourinary:  Negative for dysuria and hematuria.  Musculoskeletal:  Positive for arthralgias (Right shoulder), neck pain and neck stiffness.  Skin:  Negative for rash.  Neurological:  Negative for dizziness and weakness.  Psychiatric/Behavioral:  Positive for agitation, decreased concentration and sleep disturbance. Negative for behavioral problems. The patient is nervous/anxious.        Objective:    Physical Exam Vitals reviewed.  Constitutional:      General: He is not in acute distress.    Appearance: He is not diaphoretic.  HENT:     Head: Normocephalic and atraumatic.     Nose: Nose normal.     Mouth/Throat:     Mouth: Mucous membranes are moist.  Eyes:     General: No scleral icterus.    Extraocular Movements: Extraocular movements intact.  Cardiovascular:     Rate and Rhythm: Normal rate and regular rhythm.     Heart sounds: Normal heart sounds. No murmur heard. Pulmonary:     Breath sounds: Normal breath sounds. No wheezing or rales.  Musculoskeletal:     Right shoulder: Tenderness present. Decreased range of motion.     Cervical back: Neck supple. No tenderness.     Right lower leg: No edema.     Left lower leg: No edema.  Skin:    General: Skin is warm.     Findings: No rash.  Neurological:     General: No focal deficit present.     Mental Status: He is alert and oriented to person, place, and time.   Psychiatric:        Mood and Affect: Mood is anxious.        Behavior: Behavior is cooperative.     BP 112/72 (BP Location: Left Arm, Patient Position: Sitting, Cuff Size: Normal)   Pulse 81   Ht 5\' 11"  (1.803 m)   Wt 179 lb (81.2 kg)   SpO2 97%   BMI 24.97 kg/m  Wt Readings from Last 3 Encounters:  10/13/23 179 lb (81.2 kg)  09/15/23 173 lb 1 oz (78.5 kg)  08/27/23 173 lb 1.6 oz (78.5 kg)        Assessment & Plan:   Problem List Items Addressed This Visit       Cardiovascular and Mediastinum   Chronic deep vein thrombosis (DVT) (HCC)   History of unprovoked DVT Continue Eliquis        Nervous and Auditory   Lumbosacral radiculopathy - Primary   Has chronic low back pain Followed by Dr. Franky Macho at [facility] Was on gabapentin and chronic opioids, would avoid controlled medicines considering his polysubstance abuse history      Relevant Medications   predniSONE (STERAPRED UNI-PAK 21 TAB) 10 MG (21) TBPK tablet     Other   Bipolar 1 disorder (HCC) (Chronic)   Uncontrolled, currently not on any treatment Used to see [facility] and later [facility] He was unable to do virtual visits and due to lack of in person psychiatry locally, started with Wellbutrin for now-can also help with tobacco cessation Referred to [facility] psychiatry as he agrees for virtual visits Will try to involve managed Medicaid team to help with necessary resources so he can avail virtual visit      Relevant Orders   Ambulatory referral to Psychiatry   Chronic right shoulder pain   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    Dispense:  1 each    Refill:  0     Albi Rappaport Concha Se, MD

## 2023-10-14 NOTE — Assessment & Plan Note (Signed)
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

## 2023-10-14 NOTE — Assessment & Plan Note (Signed)
History of unprovoked DVT Continue Eliquis

## 2023-10-14 NOTE — Assessment & Plan Note (Signed)
Uncontrolled, currently not on any treatment Used to see [facility] and later [facility] psychiatry He was unable to do virtual visits and due to lack of in person psychiatry locally, started with Wellbutrin for now-can also help with tobacco cessation Referred to [facility] psychiatry as he agrees for virtual visits Will try to involve managed Medicaid team to help with necessary resources so he can avail virtual visit

## 2023-10-22 DIAGNOSIS — Z419 Encounter for procedure for purposes other than remedying health state, unspecified: Secondary | ICD-10-CM | POA: Diagnosis not present

## 2023-10-28 NOTE — H&P (Signed)
  Patient: Lance Cohen  PID: [id number]  DOB: Oct 11, 1971  SEX: Male   Patient referred by DDS for extraction teeth# 3, 4, 5, 6, 10, 11, 12, 13, 14, 31.  CC: Bad teeth.  Past Medical History:  Irregular Heart Beat, Asthma, Smoker, Arthritis, Drug Abuse/Alcohol Abuse, Weed, Spinal Stenosis, Sleep Apnea, Bradycardia, HTN, DVT, Bipolar disease    Medications: Eliquis , Albuterol , Wellbutrin     Allergies:     Penicillin, Sulfa , Sulfites    Surgeries:   Colonoscopy, Arm surgery     Social History       Smoking:            Alcohol: Drug use:                             Exam: BMI25. Multiple caries teeth# 3, 4, 5, 6, 10, 11, 12, 13, 14, 31.   No purulence, edema, fluctuance, trismus. Oral cancer screening negative. Pharynx clear. No lymphadenopathy.  Panorex:Multiple caries teeth# 3, 4, 5, 6, 10, 11, 12, 13, 14, 31. Impacted # 16, 17.   Assessment: ASA 2. Non-restorable  teeth# 3, 4, 5, 6, 10, 11, 12, 13, 14, 31.             Plan: MD Clearance  2. Extraction Teeth #  3, 4, 5, 6, 10, 11, 12, 13, 14, 31. Alveoloplasty.        Rx: n               Risks and complications explained. Questions answered.   Glendia EMERSON Primrose, DMD

## 2023-10-30 ENCOUNTER — Encounter (HOSPITAL_COMMUNITY): Payer: Self-pay | Admitting: Oral Surgery

## 2023-10-30 ENCOUNTER — Encounter (HOSPITAL_COMMUNITY): Payer: Self-pay | Admitting: Physician Assistant

## 2023-10-30 ENCOUNTER — Other Ambulatory Visit: Payer: Self-pay

## 2023-10-30 NOTE — Progress Notes (Signed)
 PCP - Tobie Suzzane POUR, MD  Cardiologist -    Alvan Dorn FALCON, MD   PPM/ICD - denies Device Orders - n/a Rep Notified - n/a  Chest x-ray - 06-22-23 EKG - 06-22-23 Stress Test -  ECHO - 06-17-21 Cardiac Cath -   CPAP - denies  Dm - denies  Blood Thinner Instructions: apixaban  (ELIQUIS )  Last dose 10-26-22 Aspirin  Instructions:   ERAS Protcol - NPo  COVID TEST- n/a  Anesthesia review: yes  Patient verbally denies any shortness of breath, fever, cough and chest pain during phone call   -------------  SDW INSTRUCTIONS given:  Your procedure is scheduled on October 31, 2023.  Report to [facility] Main Entrance A at 7:45 A.M., and check in at the Admitting office.  Call this number if you have problems the morning of surgery:  [phone]   Remember:  Do not eat or drink after midnight the night    before your surgery     Take these medicines the morning of surgery with A SIP OF WATER   buPROPion  (WELLBUTRIN  XL)   IF NEEDED   SYMBICORT   As of today, STOP taking any Aspirin  (unless otherwise instructed by your surgeon) Aleve , Naproxen , Ibuprofen , Motrin , Advil , Goody's, BC's, all herbal medications, fish oil, and all vitamins.                      Do not wear jewelry, make up, or nail polish            Do not wear lotions, powders, perfumes/colognes, or deodorant.            Do not shave 48 hours prior to surgery.  Men may shave face and neck.            Do not bring valuables to the hospital.            [facility] is not responsible for any belongings or valuables.  Do NOT Smoke (Tobacco/Vaping) 24 hours prior to your procedure If you use a CPAP at night, you may bring all equipment for your overnight stay.   Contacts, glasses, dentures or bridgework may not be worn into surgery.      For patients admitted to the hospital, discharge time will be determined by your treatment team.   Patients discharged the day of surgery will not be allowed to drive home, and  someone needs to stay with them for 24 hours.    Special instructions:   [place]- Preparing For Surgery  Before surgery, you can play an important role. Because skin is not sterile, your skin needs to be as free of germs as possible. You can reduce the number of germs on your skin by washing with CHG (chlorahexidine gluconate) Soap before surgery.  CHG is an antiseptic cleaner which kills germs and bonds with the skin to continue killing germs even after washing.    Oral Hygiene is also important to reduce your risk of infection.  Remember - BRUSH YOUR TEETH THE MORNING OF SURGERY WITH YOUR REGULAR TOOTHPASTE  Please do not use if you have an allergy to CHG or antibacterial soaps. If your skin becomes reddened/irritated stop using the CHG.  Do not shave (including legs and underarms) for at least 48 hours prior to first CHG shower. It is OK to shave your face.  Please follow these instructions carefully.   Shower the NIGHT BEFORE SURGERY and the MORNING OF SURGERY with DIAL Soap.   Pat yourself  dry with a CLEAN TOWEL.  Wear CLEAN PAJAMAS to bed the night before surgery  Place CLEAN SHEETS on your bed the night of your first shower and DO NOT SLEEP WITH PETS.   Day of Surgery: Please shower morning of surgery  Wear Clean/Comfortable clothing the morning of surgery Do not apply any deodorants/lotions.   Remember to brush your teeth WITH YOUR REGULAR TOOTHPASTE.   Questions were answered. Patient verbalized understanding of instructions.

## 2023-10-30 NOTE — Anesthesia Preprocedure Evaluation (Signed)
 Anesthesia Evaluation  Patient identified by MRN, date of birth, ID band Patient awake    Reviewed: Allergy & Precautions, NPO status , Patient's Chart, lab work & pertinent test results  Airway        Dental   Pulmonary asthma , COPD, Current Smoker and Patient abstained from smoking., PE          Cardiovascular hypertension, + DVT  + dysrhythmias   HLD  TTE 06/17/2021: IMPRESSIONS     1. Left ventricular ejection fraction, by estimation, is 55%. The left  ventricle has normal function. The left ventricle has no regional wall  motion abnormalities. Left ventricular diastolic parameters were normal.   2. Right ventricular systolic function is normal. The right ventricular  size is normal. Tricuspid regurgitation signal is inadequate for assessing  PA pressure.   3. The mitral valve is normal in structure. Trivial mitral valve  regurgitation. No evidence of mitral stenosis.   4. The aortic valve is normal in structure. Aortic valve regurgitation is  not visualized. No aortic stenosis is present.   5. Aortic dilatation noted. There is borderline dilatation of the aortic  root, measuring 37 mm.   6. The inferior vena cava is normal in size with greater than 50%  respiratory variability, suggesting right atrial pressure of 3 mmHg.   Coronary CT 10/20/2017: IMPRESSION: 1. Coronary calcium  score of 15 this was 89 percentile for age and sex matched control.   2. Normal coronary origin with right dominance.   3. Minimal non-obstructive plaque in the mid LAD with associated stenosis 0-25%. Aggressive medical management is recommended.   4. Mildly dilated pulmonary artery measuring 34 mm suggestive of pulmonary hypertension.     Neuro/Psych Seizures -,  PSYCHIATRIC DISORDERS (h/o suicide attempt) Anxiety Depression Bipolar Disorder    Neuromuscular disease (spinal stenosis, cervical radiculopathy, lumbar radiculopathy)     GI/Hepatic ,GERD  ,,(+)     substance abuse  cocaine use and marijuana use  Endo/Other    Renal/GU      Musculoskeletal  (+) Arthritis ,    Abdominal   Peds  Hematology Lab Results      Component                Value               Date                      WBC                      9.3                 08/14/2023                HGB                      15.5                08/14/2023                HCT                      46.4                08/14/2023                MCV  99 (H)              08/14/2023                PLT                      297                 08/14/2023              Anesthesia Other Findings Last Eliquis :  Reproductive/Obstetrics                              Anesthesia Physical Anesthesia Plan  ASA: 3  Anesthesia Plan: General   Post-op Pain Management: Tylenol  PO (pre-op)*   Induction: Intravenous  PONV Risk Score and Plan: 1 and Ondansetron , Dexamethasone  and Treatment may vary due to age or medical condition  Airway Management Planned: Nasal ETT  Additional Equipment:   Intra-op Plan:   Post-operative Plan: Extubation in OR  Informed Consent:      Dental advisory given  Plan Discussed with: Anesthesiologist and CRNA  Anesthesia Plan Comments: (Risks of general anesthesia discussed including, but not limited to, sore throat, hoarse voice, chipped/damaged teeth, injury to vocal cords, nausea and vomiting, allergic reactions, lung infection, heart attack, stroke, and death. All questions answered. )         Anesthesia Quick Evaluation

## 2023-10-31 ENCOUNTER — Ambulatory Visit (HOSPITAL_COMMUNITY): Admission: RE | Admit: 2023-10-31 | Payer: Medicaid Other | Source: Home / Self Care | Admitting: Oral Surgery

## 2023-10-31 HISTORY — DX: Chronic obstructive pulmonary disease, unspecified: J44.9

## 2023-10-31 SURGERY — DENTAL RESTORATION/EXTRACTIONS
Anesthesia: General

## 2023-10-31 NOTE — Progress Notes (Signed)
 Patient had an arrival time of 47. Called patient at 0800. Patient stated he was on the phone with [employer] trying to arrange a ride. Called the OR to make Dr. Sheryle aware. Dr. Sheryle stated he would wait for patient. Called patient again at 0815 to see if he was able to set up transportation. The patient stated he had been on hold with [employer] and they hung up on him. The patient stated that if he was able to set up a ride it would take an hour to get to [facility] once someone picked him up. The patient stated it's probably best to reschedule the procedure. Dr. Sheryle made aware and agreed. Patient made aware to contact the office to reschedule.

## 2023-11-03 DIAGNOSIS — M4726 Other spondylosis with radiculopathy, lumbar region: Secondary | ICD-10-CM | POA: Diagnosis not present

## 2023-11-03 DIAGNOSIS — M51379 Other intervertebral disc degeneration, lumbosacral region without mention of lumbar back pain or lower extremity pain: Secondary | ICD-10-CM | POA: Diagnosis not present

## 2023-11-03 DIAGNOSIS — Z6825 Body mass index (BMI) 25.0-25.9, adult: Secondary | ICD-10-CM | POA: Diagnosis not present

## 2023-11-03 DIAGNOSIS — M25511 Pain in right shoulder: Secondary | ICD-10-CM | POA: Diagnosis not present

## 2023-11-03 DIAGNOSIS — Z7689 Persons encountering health services in other specified circumstances: Secondary | ICD-10-CM | POA: Diagnosis not present

## 2023-11-05 ENCOUNTER — Encounter (HOSPITAL_COMMUNITY): Payer: Self-pay

## 2023-11-05 ENCOUNTER — Encounter (HOSPITAL_COMMUNITY)
Admission: RE | Admit: 2023-11-05 | Discharge: 2023-11-05 | Disposition: A | Discharge status: Home / Self Care | Payer: Medicaid Other | Source: Ambulatory Visit | Attending: Internal Medicine | Admitting: Internal Medicine

## 2023-11-05 HISTORY — DX: Other specified health status: Z78.9

## 2023-11-06 ENCOUNTER — Telehealth: Payer: Self-pay | Admitting: Orthopedic Surgery

## 2023-11-06 NOTE — Telephone Encounter (Signed)
Dr. Mort Sawyers pt - pt lvm yelling and cussing stating that if Dr. Rexene Edison didn't want to see him he'd so somewhere else.  He didn't leave his name or #, I did some digging and found it.  I called him back, he immediately was cussing at me and I asked him not to.  He yells when he speaks.  He stated that you did surgery on his rt shoulder.  He is having lots of pain and wants to come in and see you.  He stated that he has saw Dr. Coletta Memos 11/03/23.  Please advise, schedule?  [phone]

## 2023-11-06 NOTE — Telephone Encounter (Signed)
He is having right shoulder pain.

## 2023-11-10 ENCOUNTER — Encounter (HOSPITAL_COMMUNITY): Admission: RE | Payer: Self-pay | Source: Home / Self Care

## 2023-11-10 ENCOUNTER — Encounter: Payer: Self-pay | Admitting: *Deleted

## 2023-11-10 ENCOUNTER — Ambulatory Visit (HOSPITAL_COMMUNITY): Admission: RE | Admit: 2023-11-10 | Payer: Medicaid Other | Source: Home / Self Care

## 2023-11-10 ENCOUNTER — Telehealth: Payer: Self-pay | Admitting: *Deleted

## 2023-11-10 SURGERY — COLONOSCOPY WITH PROPOFOL
Anesthesia: Monitor Anesthesia Care

## 2023-11-10 NOTE — Telephone Encounter (Signed)
Pt has been rescheduled for 11/24/23 and clenpiq prep placed up front along with instructions for pt to come by and pick up      Message Received: Today Elinor Dodge, LPN  Elinor Dodge, LPN      Previous Messages    ----- Message ----- From: Ferne Reus Sent: 11/10/2023   7:52 AM EST To: Elinor Dodge, LPN; Armstead Peaks, CMA   ----- Message ----- From: Carlean Purl, RN Sent: 11/10/2023   7:12 AM EST To: Ferne Reus; Michail Sermon  Patient cancelled procedure this am - he could not drink the prep.  Informed him to reschedule with the office .  Thanks

## 2023-11-12 DIAGNOSIS — M51379 Other intervertebral disc degeneration, lumbosacral region without mention of lumbar back pain or lower extremity pain: Secondary | ICD-10-CM | POA: Insufficient documentation

## 2023-11-12 NOTE — Progress Notes (Unsigned)
  Intake history:  There were no vitals taken for this visit. There is no height or weight on file to calculate BMI.    WHAT ARE WE SEEING YOU FOR TODAY?   right shoulder  How long has this bothered you?   {TIME; AGO:17960}  Anticoag.  Yes  Diabetes No  Heart disease Yes  Hypertension Yes  SMOKING HX Yes  Kidney disease No  Any ALLERGIES ______________________________________________   Treatment:  Have you taken:  Tylenol {yes/no:20286}  Advil {yes/no:20286}  Had PT {yes/no:20286}  Had injection {yes/no:20286}  Other  _________________________

## 2023-11-13 ENCOUNTER — Other Ambulatory Visit (INDEPENDENT_AMBULATORY_CARE_PROVIDER_SITE_OTHER): Payer: Self-pay

## 2023-11-13 ENCOUNTER — Ambulatory Visit: Payer: Medicaid Other | Admitting: Orthopedic Surgery

## 2023-11-13 ENCOUNTER — Encounter: Payer: Self-pay | Admitting: Orthopedic Surgery

## 2023-11-13 VITALS — BP 133/85 | HR 69 | Ht 70.0 in | Wt 180.0 lb

## 2023-11-13 DIAGNOSIS — M542 Cervicalgia: Secondary | ICD-10-CM

## 2023-11-13 DIAGNOSIS — G8929 Other chronic pain: Secondary | ICD-10-CM

## 2023-11-13 DIAGNOSIS — M25511 Pain in right shoulder: Secondary | ICD-10-CM

## 2023-11-13 MED ORDER — GABAPENTIN 300 MG PO CAPS: 300.0000 mg | ORAL_CAPSULE | Freq: Three times a day (TID) | ORAL | 5 refills | Status: DC

## 2023-11-13 MED ORDER — METHOCARBAMOL 750 MG PO TABS: 750.0000 mg | ORAL_TABLET | Freq: Four times a day (QID) | ORAL | 2 refills | Status: DC

## 2023-11-13 NOTE — Progress Notes (Signed)
Patient ID: Lance Cohen, male   DOB: December 01, 1970, 53 y.o.   MRN: [id number]  Chief Complaint  Patient presents with   Shoulder Pain    Right     Assessment and plan  Unclear etiology for right shoulder pain  Patient says pain medicines do not work for him.  He does get relief from taking mushrooms  He says he supposed to be on gabapentin but does not take work  We discussed possible options and we decided to try a course of gabapentin and Robaxin and then see him in 2 weeks to see if he has any better  Meds ordered this encounter  Medications   gabapentin (NEURONTIN) 300 MG capsule    Sig: Take 1 capsule (300 mg total) by mouth 3 (three) times daily.    Dispense:  90 capsule    Refill:  5   methocarbamol (ROBAXIN) 750 MG tablet    Sig: Take 1 tablet (750 mg total) by mouth 4 (four) times daily.    Dispense:  60 tablet    Refill:  2    BP 133/85   Pulse 69   Ht 5\' 10"  (1.778 m)   Wt 180 lb (81.6 kg)   BMI 25.83 kg/m   Problem list, medical hx, medications and allergies reviewed    52 year old male with right shoulder and neck pain   Intake history:   BP 133/85   Pulse 69   Ht 5\' 10"  (1.778 m)   Wt 180 lb (81.6 kg)   BMI 25.83 kg/m  Body mass index is 25.83 kg/m.       WHAT ARE WE SEEING YOU FOR TODAY?    right shoulder   How long has this bothered you?  Started approximately 6 month(s) ago No known injury  Anticoag.  Yes   Diabetes No   Heart disease Yes   Hypertension Yes   SMOKING HX Yes   Kidney disease No   Any ALLERGIES ______________________________________________     Treatment:  Have you taken:  Tylenol No  Advil No  Had PT No  Had injection No  Other  _________________________     Physical Exam Vitals and nursing note reviewed.  Constitutional:      Appearance: Normal appearance.  HENT:     Head: Normocephalic and atraumatic.  Eyes:     General: No scleral icterus.       Right eye: No discharge.        Left eye:  No discharge.     Extraocular Movements: Extraocular movements intact.     Conjunctiva/sclera: Conjunctivae normal.     Pupils: Pupils are equal, round, and reactive to light.  Cardiovascular:     Rate and Rhythm: Normal rate.     Pulses: Normal pulses.  Musculoskeletal:     Comments: Right shoulder abduction strength is 5 out of 5 passive range of motion is normal pain is tender somewhat in the biceps proximal tendon area most of his pain and tenderness is in the trapezius muscle  He gets increased pain when turning his neck to the left and there is mild tenderness at the base of the cervical spine  Skin:    General: Skin is warm and dry.     Capillary Refill: Capillary refill takes less than 2 seconds.  Neurological:     General: No focal deficit present.     Mental Status: He is alert and oriented to person, place, and time.  Psychiatric:  Mood and Affect: Mood normal.        Behavior: Behavior normal.        Thought Content: Thought content normal.        Judgment: Judgment normal.   DG Cervical Spine 2 or 3 views Result Date: 11/13/2023 Cervical spine images Shoulder pain Lateral C-spine relatively normal Anterior inferior endplate abnormalities subtle C4-5 and 6 Uncovertebral joints mid cervical spine mild arthritis Impression mild cervical spondylosis   DG Shoulder Right Result Date: 11/13/2023 Right shoulder xrays for Right shoulder pain No injury Decreased humeral head to acromial distance No arthritic changes in the glenohumeral joint Degeneration in the [facility] joint High riding humeral head suggests rotator cuff pathology AC joint arthritis

## 2023-11-14 DIAGNOSIS — M51372 Other intervertebral disc degeneration, lumbosacral region with discogenic back pain and lower extremity pain: Secondary | ICD-10-CM | POA: Diagnosis not present

## 2023-11-15 DIAGNOSIS — M51372 Other intervertebral disc degeneration, lumbosacral region with discogenic back pain and lower extremity pain: Secondary | ICD-10-CM | POA: Diagnosis not present

## 2023-11-16 DIAGNOSIS — M51372 Other intervertebral disc degeneration, lumbosacral region with discogenic back pain and lower extremity pain: Secondary | ICD-10-CM | POA: Diagnosis not present

## 2023-11-17 ENCOUNTER — Telehealth: Payer: Self-pay | Admitting: Orthopedic Surgery

## 2023-11-17 DIAGNOSIS — M51372 Other intervertebral disc degeneration, lumbosacral region with discogenic back pain and lower extremity pain: Secondary | ICD-10-CM | POA: Diagnosis not present

## 2023-11-17 NOTE — Telephone Encounter (Signed)
Dr. Mort Sawyers pt - spoke w/the pt, he stated that when he moves his head he is having shooting pain from his neck to the top of his head, so bad he has to grab his head.  He wants to know what Dr. Romeo Apple thinks.  [phone]

## 2023-11-17 NOTE — Telephone Encounter (Signed)
I have called the patient and spoken w/him and got him scheduled for 11/20/23 per Dr. Rexene Edison.

## 2023-11-18 DIAGNOSIS — M51372 Other intervertebral disc degeneration, lumbosacral region with discogenic back pain and lower extremity pain: Secondary | ICD-10-CM | POA: Diagnosis not present

## 2023-11-19 DIAGNOSIS — M51372 Other intervertebral disc degeneration, lumbosacral region with discogenic back pain and lower extremity pain: Secondary | ICD-10-CM | POA: Diagnosis not present

## 2023-11-19 NOTE — Progress Notes (Unsigned)
   There were no vitals taken for this visit.  There is no height or weight on file to calculate BMI.  No chief complaint on file.   No diagnosis found.  DOI/DOS/ {Date:23773::"***"}  {CHL AMB ORT SYMPTOMS POST TREATMENT:21798}

## 2023-11-20 ENCOUNTER — Telehealth: Payer: Self-pay | Admitting: *Deleted

## 2023-11-20 ENCOUNTER — Encounter (HOSPITAL_COMMUNITY)
Admission: RE | Admit: 2023-11-20 | Discharge: 2023-11-20 | Disposition: A | Discharge status: Home / Self Care | Payer: Medicaid Other | Source: Ambulatory Visit | Attending: Internal Medicine | Admitting: Internal Medicine

## 2023-11-20 ENCOUNTER — Other Ambulatory Visit: Payer: Self-pay

## 2023-11-20 ENCOUNTER — Ambulatory Visit: Payer: Medicaid Other | Admitting: Orthopedic Surgery

## 2023-11-20 VITALS — Ht 70.0 in | Wt 180.0 lb

## 2023-11-20 DIAGNOSIS — F1410 Cocaine abuse, uncomplicated: Secondary | ICD-10-CM | POA: Insufficient documentation

## 2023-11-20 DIAGNOSIS — M542 Cervicalgia: Secondary | ICD-10-CM | POA: Diagnosis not present

## 2023-11-20 DIAGNOSIS — M5412 Radiculopathy, cervical region: Secondary | ICD-10-CM | POA: Diagnosis not present

## 2023-11-20 DIAGNOSIS — Z01812 Encounter for preprocedural laboratory examination: Secondary | ICD-10-CM | POA: Diagnosis not present

## 2023-11-20 DIAGNOSIS — M51372 Other intervertebral disc degeneration, lumbosacral region with discogenic back pain and lower extremity pain: Secondary | ICD-10-CM | POA: Diagnosis not present

## 2023-11-20 LAB — RAPID URINE DRUG SCREEN, HOSP PERFORMED
Amphetamines: NOT DETECTED
Barbiturates: NOT DETECTED
Benzodiazepines: NOT DETECTED
Cocaine: POSITIVE — AB
Opiates: NOT DETECTED
Tetrahydrocannabinol: POSITIVE — AB

## 2023-11-20 NOTE — Progress Notes (Signed)
Chief Complaint  Patient presents with   Neck Pain    States neck is painful with turning head    53 year old male seen last week ago with shoulder and neck pain  Advised to start gabapentin methocarbamol presents back after calling the office with acute onset of severe neck pain and headache with radicular pain in his right upper extremity    Neck Pain      Medication clued gabapentin Robaxin and Eliquis  Examination shows some rigidity of the cervical spine tenderness over the paracervical musculature on the right in the midline at C5-7 Weakness in the right upper extremity Tingling in C6 and C7 distribution with decreased sensation  Encounter Diagnoses  Name Primary?   Neck pain    Radiculopathy, cervical region Yes   Cervicalgia     MRI cervical spine follow-up results in the office expect surgical lesion

## 2023-11-20 NOTE — Progress Notes (Signed)
ERROR

## 2023-11-20 NOTE — Telephone Encounter (Signed)
Patient USD positive for cocaine and procedure will have to be rescheduled.  Spoke with pt. He stated he uses it for pain as nothing else helps him and uses QOD. Advised he will have to be off of it 14 days to do the procedure. He stated he can be. Scheduled for 2/17 at 2pm. Expressed again how important he remains off. Will call back with new pre-op appt.

## 2023-11-20 NOTE — Patient Instructions (Signed)
While we are working on your approval for MRI please go ahead and call to schedule your appointment with [facility] or [facility] for Imaging within at least one (1) week.   Central Scheduling [phone]

## 2023-11-20 NOTE — Patient Instructions (Signed)
CHIEF WALKUP  11/20/2023     @PREFPERIOPPHARMACY @   Your procedure is scheduled on Monday, 2/3.  Report to [facility] at 1200 P.M.  Call this number if you have problems the morning of surgery:  [phone]  If you experience any cold or flu symptoms such as cough, fever, chills, shortness of breath, etc. between now and your scheduled surgery, please notify us at the above number.   Remember:  Do not eat or drink after midnight.  You may drink clear liquids until 1000 .  Clear liquids allowed are:                    Water, Juice (No red color; non-citric and without pulp; diabetics please choose diet or no sugar options), Carbonated beverages (diabetics please choose diet or no sugar options), Clear Tea (No creamer, milk, or cream, including half & half and powdered creamer), Black Coffee Only (No creamer, milk or cream, including half & half and powdered creamer), Plain Jell-O Only (No red color; diabetics please choose no sugar options), Clear Sports drink (No red color; diabetics please choose diet or no sugar options), and Plain Popsicles Only (No red color; diabetics please choose no sugar options)    Take these medicines the morning of surgery with A SIP OF WATER :WELLBUTRIN, GABAPENTIN, AND ROBAXIN, USE INHALER    Do not wear jewelry, make-up or nail polish, including gel polish,  artificial nails, or any other type of covering on natural nails (fingers and  toes).  Do not wear lotions, powders, or perfumes, or deodorant.  Do not shave 48 hours prior to surgery.  Men may shave face and neck.  Do not bring valuables to the hospital.  [facility] is not responsible for any belongings or valuables.  Contacts, dentures or bridgework may not be worn into surgery.  Leave your suitcase in the car.  After surgery it may be brought to your room.  For patients admitted to the hospital, discharge time will be determined by your treatment team.  Patients discharged the day of  surgery will not be allowed to drive home.   Name and phone number of your driver:   FAMILY Special instructions:  FOLLOW PREP AND DIET INSTRUCTIONS GIVEN TO YOU AT DR CARVER'S OFFICE.  Please read over the following fact sheets that you were given.       Colonoscopy, Adult, Care After The following information offers guidance on how to care for yourself after your procedure. Your health care provider may also give you more specific instructions. If you have problems or questions, contact your health care provider. What can I expect after the procedure? After the procedure, it is common to have: A small amount of blood in your stool for 24 hours after the procedure. Some gas. Mild cramping or bloating of your abdomen. Follow these instructions at home: Eating and drinking  Drink enough fluid to keep your urine pale yellow. Follow instructions from your health care provider about eating or drinking restrictions. Resume your normal diet as told by your health care provider. Avoid heavy or fried foods that are hard to digest. Activity Rest as told by your health care provider. Avoid sitting for a long time without moving. Get up to take short walks every 1-2 hours. This is important to improve blood flow and breathing. Ask for help if you feel weak or unsteady. Return to your normal activities as told by your health care provider. Ask your  health care provider what activities are safe for you. Managing cramping and bloating  Try walking around when you have cramps or feel bloated. If directed, apply heat to your abdomen as told by your health care provider. Use the heat source that your health care provider recommends, such as a moist heat pack or a heating pad. Place a towel between your skin and the heat source. Leave the heat on for 20-30 minutes. Remove the heat if your skin turns bright red. This is especially important if you are unable to feel pain, heat, or cold. You have a  greater risk of getting burned. General instructions If you were given a sedative during the procedure, it can affect you for several hours. Do not drive or operate machinery until your health care provider says that it is safe. For the first 24 hours after the procedure: Do not sign important documents. Do not drink alcohol. Do your regular daily activities at a slower pace than normal. Eat soft foods that are easy to digest. Take over-the-counter and prescription medicines only as told by your health care provider. Keep all follow-up visits. This is important. Contact a health care provider if: You have blood in your stool 2-3 days after the procedure. Get help right away if: You have more than a small spotting of blood in your stool. You have large blood clots in your stool. You have swelling of your abdomen. You have nausea or vomiting. You have a fever. You have increasing pain in your abdomen that is not relieved with medicine. These symptoms may be an emergency. Get help right away. Call 911. Do not wait to see if the symptoms will go away. Do not drive yourself to the hospital. Summary After the procedure, it is common to have a small amount of blood in your stool. You may also have mild cramping and bloating of your abdomen. If you were given a sedative during the procedure, it can affect you for several hours. Do not drive or operate machinery until your health care provider says that it is safe. Get help right away if you have a lot of blood in your stool, nausea or vomiting, a fever, or increased pain in your abdomen. This information is not intended to replace advice given to you by your health care provider. Make sure you discuss any questions you have with your health care provider. Document Revised: 11/19/2022 Document Reviewed: 05/30/2021 Elsevier Patient Education  2024 [employer].

## 2023-11-21 DIAGNOSIS — M51372 Other intervertebral disc degeneration, lumbosacral region with discogenic back pain and lower extremity pain: Secondary | ICD-10-CM | POA: Diagnosis not present

## 2023-11-21 NOTE — Telephone Encounter (Signed)
Spoke with pt and he is aware of pre-op appt.

## 2023-11-22 DIAGNOSIS — M51372 Other intervertebral disc degeneration, lumbosacral region with discogenic back pain and lower extremity pain: Secondary | ICD-10-CM | POA: Diagnosis not present

## 2023-11-22 DIAGNOSIS — Z419 Encounter for procedure for purposes other than remedying health state, unspecified: Secondary | ICD-10-CM | POA: Diagnosis not present

## 2023-11-23 DIAGNOSIS — M51372 Other intervertebral disc degeneration, lumbosacral region with discogenic back pain and lower extremity pain: Secondary | ICD-10-CM | POA: Diagnosis not present

## 2023-11-24 DIAGNOSIS — M51372 Other intervertebral disc degeneration, lumbosacral region with discogenic back pain and lower extremity pain: Secondary | ICD-10-CM | POA: Diagnosis not present

## 2023-11-25 DIAGNOSIS — M51372 Other intervertebral disc degeneration, lumbosacral region with discogenic back pain and lower extremity pain: Secondary | ICD-10-CM | POA: Diagnosis not present

## 2023-11-26 DIAGNOSIS — M51372 Other intervertebral disc degeneration, lumbosacral region with discogenic back pain and lower extremity pain: Secondary | ICD-10-CM | POA: Diagnosis not present

## 2023-11-26 NOTE — Progress Notes (Deleted)
   There were no vitals taken for this visit.  There is no height or weight on file to calculate BMI.  No chief complaint on file.   No diagnosis found.  DOI/DOS/  ongoing/ the MRI is scheduled for tomorrow of c spine   {CHL AMB ORT SYMPTOMS POST TREATMENT:21798}

## 2023-11-27 ENCOUNTER — Ambulatory Visit: Payer: Medicaid Other | Admitting: Orthopedic Surgery

## 2023-11-27 DIAGNOSIS — M51372 Other intervertebral disc degeneration, lumbosacral region with discogenic back pain and lower extremity pain: Secondary | ICD-10-CM | POA: Diagnosis not present

## 2023-11-28 ENCOUNTER — Ambulatory Visit (HOSPITAL_COMMUNITY): Payer: Medicaid Other

## 2023-11-28 DIAGNOSIS — M51372 Other intervertebral disc degeneration, lumbosacral region with discogenic back pain and lower extremity pain: Secondary | ICD-10-CM | POA: Diagnosis not present

## 2023-11-28 NOTE — Progress Notes (Deleted)
   There were no vitals taken for this visit.  There is no height or weight on file to calculate BMI.  No chief complaint on file.   No diagnosis found.  DOI/DOS/ {Date:23773::"***"}  {CHL AMB ORT SYMPTOMS POST TREATMENT:21798}

## 2023-11-29 DIAGNOSIS — M51372 Other intervertebral disc degeneration, lumbosacral region with discogenic back pain and lower extremity pain: Secondary | ICD-10-CM | POA: Diagnosis not present

## 2023-11-30 DIAGNOSIS — M51372 Other intervertebral disc degeneration, lumbosacral region with discogenic back pain and lower extremity pain: Secondary | ICD-10-CM | POA: Diagnosis not present

## 2023-12-01 ENCOUNTER — Ambulatory Visit: Payer: Medicaid Other | Admitting: Orthopedic Surgery

## 2023-12-01 DIAGNOSIS — M51372 Other intervertebral disc degeneration, lumbosacral region with discogenic back pain and lower extremity pain: Secondary | ICD-10-CM | POA: Diagnosis not present

## 2023-12-02 DIAGNOSIS — M51372 Other intervertebral disc degeneration, lumbosacral region with discogenic back pain and lower extremity pain: Secondary | ICD-10-CM | POA: Diagnosis not present

## 2023-12-03 DIAGNOSIS — M51372 Other intervertebral disc degeneration, lumbosacral region with discogenic back pain and lower extremity pain: Secondary | ICD-10-CM | POA: Diagnosis not present

## 2023-12-03 NOTE — Patient Instructions (Signed)
Lance Cohen  12/03/2023     @PREFPERIOPPHARMACY @   Your procedure is scheduled on  12/08/2023.   Report to [facility] at  1200 P.M.   Call this number if you have problems the morning of surgery:  [phone]  If you experience any cold or flu symptoms such as cough, fever, chills, shortness of breath, etc. between now and your scheduled surgery, please notify us at the above number.   Remember:         Your last dose of eliquis should have been on 12/05/2023.         Use your inhaler before you come and bring your rescue inhaler with you.   Follow the diet and prep instructions given to you by the office.   You may drink clear liquids until 1000 am on 12/08/2023.     Clear liquids allowed are:                    Water, Juice (No red color; non-citric and without pulp; diabetics please choose diet or no sugar options), Carbonated beverages (diabetics please choose diet or no sugar options), Clear Tea (No creamer, milk, or cream, including half & half and powdered creamer), Black Coffee Only (No creamer, milk or cream, including half & half and powdered creamer), and Clear Sports drink (No red color; diabetics please choose diet or no sugar options)    Take these medicines the morning of surgery with A SIP OF WATER         bupropion, gabapentin, methocarbamol (if needed).     Do not wear jewelry, make-up or nail polish, including gel polish,  artificial nails, or any other type of covering on natural nails (fingers and  toes).  Do not wear lotions, powders, or perfumes, or deodorant.  Do not shave 48 hours prior to surgery.  Men may shave face and neck.  Do not bring valuables to the hospital.  [facility] is not responsible for any belongings or valuables.  Contacts, dentures or bridgework may not be worn into surgery.  Leave your suitcase in the car.  After surgery it may be brought to your room.  For patients admitted to the hospital, discharge time will be  determined by your treatment team.  Patients discharged the day of surgery will not be allowed to drive home and must have someone with them for 24 hours.    Special instructions:   DO NOT smoke tobacco or vape for 24 hours before your procedure.  Please read over the following fact sheets that you were given. Anesthesia Post-op Instructions and Care and Recovery After Surgery      Colonoscopy, Adult, Care After The following information offers guidance on how to care for yourself after your procedure. Your health care provider may also give you more specific instructions. If you have problems or questions, contact your health care provider. What can I expect after the procedure? After the procedure, it is common to have: A small amount of blood in your stool for 24 hours after the procedure. Some gas. Mild cramping or bloating of your abdomen. Follow these instructions at home: Eating and drinking  Drink enough fluid to keep your urine pale yellow. Follow instructions from your health care provider about eating or drinking restrictions. Resume your normal diet as told by your health care provider. Avoid heavy or fried foods that are hard to digest. Activity Rest as told by your health  care provider. Avoid sitting for a long time without moving. Get up to take short walks every 1-2 hours. This is important to improve blood flow and breathing. Ask for help if you feel weak or unsteady. Return to your normal activities as told by your health care provider. Ask your health care provider what activities are safe for you. Managing cramping and bloating  Try walking around when you have cramps or feel bloated. If directed, apply heat to your abdomen as told by your health care provider. Use the heat source that your health care provider recommends, such as a moist heat pack or a heating pad. Place a towel between your skin and the heat source. Leave the heat on for 20-30 minutes. Remove  the heat if your skin turns bright red. This is especially important if you are unable to feel pain, heat, or cold. You have a greater risk of getting burned. General instructions If you were given a sedative during the procedure, it can affect you for several hours. Do not drive or operate machinery until your health care provider says that it is safe. For the first 24 hours after the procedure: Do not sign important documents. Do not drink alcohol. Do your regular daily activities at a slower pace than normal. Eat soft foods that are easy to digest. Take over-the-counter and prescription medicines only as told by your health care provider. Keep all follow-up visits. This is important. Contact a health care provider if: You have blood in your stool 2-3 days after the procedure. Get help right away if: You have more than a small spotting of blood in your stool. You have large blood clots in your stool. You have swelling of your abdomen. You have nausea or vomiting. You have a fever. You have increasing pain in your abdomen that is not relieved with medicine. These symptoms may be an emergency. Get help right away. Call 911. Do not wait to see if the symptoms will go away. Do not drive yourself to the hospital. Summary After the procedure, it is common to have a small amount of blood in your stool. You may also have mild cramping and bloating of your abdomen. If you were given a sedative during the procedure, it can affect you for several hours. Do not drive or operate machinery until your health care provider says that it is safe. Get help right away if you have a lot of blood in your stool, nausea or vomiting, a fever, or increased pain in your abdomen. This information is not intended to replace advice given to you by your health care provider. Make sure you discuss any questions you have with your health care provider. Document Revised: 11/19/2022 Document Reviewed: 05/30/2021 Elsevier  Patient Education  2024 [employer].Monitored Anesthesia Care, Care After The following information offers guidance on how to care for yourself after your procedure. Your health care provider may also give you more specific instructions. If you have problems or questions, contact your health care provider. What can I expect after the procedure? After the procedure, it is common to have: Tiredness. Little or no memory about what happened during or after the procedure. Impaired judgment when it comes to making decisions. Nausea or vomiting. Some trouble with balance. Follow these instructions at home: For the time period you were told by your health care provider:  Rest. Do not participate in activities where you could fall or become injured. Do not drive or use machinery. Do not drink alcohol.  Do not take sleeping pills or medicines that cause drowsiness. Do not make important decisions or sign legal documents. Do not take care of children on your own. Medicines Take over-the-counter and prescription medicines only as told by your health care provider. If you were prescribed antibiotics, take them as told by your health care provider. Do not stop using the antibiotic even if you start to feel better. Eating and drinking Follow instructions from your health care provider about what you may eat and drink. Drink enough fluid to keep your urine pale yellow. If you vomit: Drink clear fluids slowly and in small amounts as you are able. Clear fluids include water, ice chips, low-calorie sports drinks, and fruit juice that has water added to it (diluted fruit juice). Eat light and bland foods in small amounts as you are able. These foods include bananas, applesauce, rice, lean meats, toast, and crackers. General instructions  Have a responsible adult stay with you for the time you are told. It is important to have someone help care for you until you are awake and alert. If you have sleep  apnea, surgery and some medicines can increase your risk for breathing problems. Follow instructions from your health care provider about wearing your sleep device: When you are sleeping. This includes during daytime naps. While taking prescription pain medicines, sleeping medicines, or medicines that make you drowsy. Do not use any products that contain nicotine or tobacco. These products include cigarettes, chewing tobacco, and vaping devices, such as e-cigarettes. If you need help quitting, ask your health care provider. Contact a health care provider if: You feel nauseous or vomit every time you eat or drink. You feel light-headed. You are still sleepy or having trouble with balance after 24 hours. You get a rash. You have a fever. You have redness or swelling around the IV site. Get help right away if: You have trouble breathing. You have new confusion after you get home. These symptoms may be an emergency. Get help right away. Call 911. Do not wait to see if the symptoms will go away. Do not drive yourself to the hospital. This information is not intended to replace advice given to you by your health care provider. Make sure you discuss any questions you have with your health care provider. Document Revised: 03/04/2022 Document Reviewed: 03/04/2022 Elsevier Patient Education  2024 [employer].

## 2023-12-04 DIAGNOSIS — M51372 Other intervertebral disc degeneration, lumbosacral region with discogenic back pain and lower extremity pain: Secondary | ICD-10-CM | POA: Diagnosis not present

## 2023-12-05 ENCOUNTER — Encounter (HOSPITAL_COMMUNITY): Payer: Self-pay

## 2023-12-05 ENCOUNTER — Ambulatory Visit (HOSPITAL_COMMUNITY)
Admission: RE | Admit: 2023-12-05 | Discharge: 2023-12-05 | Disposition: A | Discharge status: Home / Self Care | Payer: Medicaid Other | Source: Ambulatory Visit | Attending: Orthopedic Surgery | Admitting: Orthopedic Surgery

## 2023-12-05 ENCOUNTER — Encounter (HOSPITAL_COMMUNITY)
Admission: RE | Admit: 2023-12-05 | Discharge: 2023-12-05 | Disposition: A | Discharge status: Home / Self Care | Payer: Medicaid Other | Source: Ambulatory Visit | Attending: Internal Medicine | Admitting: Internal Medicine

## 2023-12-05 DIAGNOSIS — M542 Cervicalgia: Secondary | ICD-10-CM | POA: Insufficient documentation

## 2023-12-05 DIAGNOSIS — M51372 Other intervertebral disc degeneration, lumbosacral region with discogenic back pain and lower extremity pain: Secondary | ICD-10-CM | POA: Diagnosis not present

## 2023-12-05 DIAGNOSIS — M4802 Spinal stenosis, cervical region: Secondary | ICD-10-CM | POA: Diagnosis not present

## 2023-12-05 DIAGNOSIS — F1410 Cocaine abuse, uncomplicated: Secondary | ICD-10-CM

## 2023-12-05 DIAGNOSIS — M50222 Other cervical disc displacement at C5-C6 level: Secondary | ICD-10-CM | POA: Diagnosis not present

## 2023-12-05 DIAGNOSIS — Z7689 Persons encountering health services in other specified circumstances: Secondary | ICD-10-CM | POA: Diagnosis not present

## 2023-12-05 DIAGNOSIS — M50223 Other cervical disc displacement at C6-C7 level: Secondary | ICD-10-CM | POA: Diagnosis not present

## 2023-12-05 DIAGNOSIS — M50321 Other cervical disc degeneration at C4-C5 level: Secondary | ICD-10-CM | POA: Diagnosis not present

## 2023-12-05 DIAGNOSIS — F1910 Other psychoactive substance abuse, uncomplicated: Secondary | ICD-10-CM

## 2023-12-05 NOTE — Progress Notes (Addendum)
Patient no show PAT appointment today. Spoke with patient's wife, she will give him our phone number so he can call us back to reschedule PAT appointment.

## 2023-12-06 DIAGNOSIS — M51372 Other intervertebral disc degeneration, lumbosacral region with discogenic back pain and lower extremity pain: Secondary | ICD-10-CM | POA: Diagnosis not present

## 2023-12-07 DIAGNOSIS — M51372 Other intervertebral disc degeneration, lumbosacral region with discogenic back pain and lower extremity pain: Secondary | ICD-10-CM | POA: Diagnosis not present

## 2023-12-08 ENCOUNTER — Encounter (HOSPITAL_COMMUNITY): Admission: RE | Payer: Self-pay | Source: Home / Self Care

## 2023-12-08 ENCOUNTER — Ambulatory Visit (HOSPITAL_COMMUNITY): Admission: RE | Admit: 2023-12-08 | Payer: Medicaid Other | Source: Home / Self Care | Admitting: Internal Medicine

## 2023-12-08 ENCOUNTER — Encounter: Payer: Self-pay | Admitting: *Deleted

## 2023-12-08 ENCOUNTER — Telehealth: Payer: Self-pay | Admitting: *Deleted

## 2023-12-08 ENCOUNTER — Encounter (HOSPITAL_COMMUNITY): Payer: Self-pay | Admitting: Anesthesiology

## 2023-12-08 DIAGNOSIS — M51372 Other intervertebral disc degeneration, lumbosacral region with discogenic back pain and lower extremity pain: Secondary | ICD-10-CM | POA: Diagnosis not present

## 2023-12-08 SURGERY — COLONOSCOPY WITH PROPOFOL
Anesthesia: Choice

## 2023-12-08 NOTE — Telephone Encounter (Signed)
Pt has been rescheduled to 01/05/24. Updated instructions mailed to pt.

## 2023-12-08 NOTE — Telephone Encounter (Signed)
Message Received: Today Nadean Corwin, Hawke Villalpando L, LPN This patient says he didn't know his procedure was today and did not come for PAT Friday.  I have placed him in the depot.  I advised his to call your office to reschedule.

## 2023-12-09 DIAGNOSIS — M51372 Other intervertebral disc degeneration, lumbosacral region with discogenic back pain and lower extremity pain: Secondary | ICD-10-CM | POA: Diagnosis not present

## 2023-12-10 DIAGNOSIS — M51372 Other intervertebral disc degeneration, lumbosacral region with discogenic back pain and lower extremity pain: Secondary | ICD-10-CM | POA: Diagnosis not present

## 2023-12-10 NOTE — Telephone Encounter (Signed)
Pt called in and he is aware of new pre-op appointment.

## 2023-12-11 ENCOUNTER — Telehealth: Payer: Self-pay | Admitting: Orthopedic Surgery

## 2023-12-11 DIAGNOSIS — M51372 Other intervertebral disc degeneration, lumbosacral region with discogenic back pain and lower extremity pain: Secondary | ICD-10-CM | POA: Diagnosis not present

## 2023-12-11 NOTE — Telephone Encounter (Signed)
Called left message to call back tomorrow to discuss results

## 2023-12-12 DIAGNOSIS — M51372 Other intervertebral disc degeneration, lumbosacral region with discogenic back pain and lower extremity pain: Secondary | ICD-10-CM | POA: Diagnosis not present

## 2023-12-13 DIAGNOSIS — M51372 Other intervertebral disc degeneration, lumbosacral region with discogenic back pain and lower extremity pain: Secondary | ICD-10-CM | POA: Diagnosis not present

## 2023-12-14 DIAGNOSIS — M51372 Other intervertebral disc degeneration, lumbosacral region with discogenic back pain and lower extremity pain: Secondary | ICD-10-CM | POA: Diagnosis not present

## 2023-12-15 ENCOUNTER — Ambulatory Visit (INDEPENDENT_AMBULATORY_CARE_PROVIDER_SITE_OTHER): Payer: Medicaid Other | Admitting: Internal Medicine

## 2023-12-15 ENCOUNTER — Ambulatory Visit: Payer: Medicaid Other | Admitting: Orthopedic Surgery

## 2023-12-15 ENCOUNTER — Encounter: Payer: Self-pay | Admitting: Internal Medicine

## 2023-12-15 ENCOUNTER — Encounter: Payer: Self-pay | Admitting: Orthopedic Surgery

## 2023-12-15 VITALS — Ht 70.0 in | Wt 180.0 lb

## 2023-12-15 VITALS — BP 88/60 | HR 94 | Ht 70.0 in | Wt 183.8 lb

## 2023-12-15 DIAGNOSIS — F319 Bipolar disorder, unspecified: Secondary | ICD-10-CM | POA: Diagnosis not present

## 2023-12-15 DIAGNOSIS — M5417 Radiculopathy, lumbosacral region: Secondary | ICD-10-CM

## 2023-12-15 DIAGNOSIS — M51372 Other intervertebral disc degeneration, lumbosacral region with discogenic back pain and lower extremity pain: Secondary | ICD-10-CM | POA: Diagnosis not present

## 2023-12-15 DIAGNOSIS — Z720 Tobacco use: Secondary | ICD-10-CM | POA: Diagnosis not present

## 2023-12-15 DIAGNOSIS — M4802 Spinal stenosis, cervical region: Secondary | ICD-10-CM | POA: Diagnosis not present

## 2023-12-15 DIAGNOSIS — F17210 Nicotine dependence, cigarettes, uncomplicated: Secondary | ICD-10-CM | POA: Diagnosis not present

## 2023-12-15 DIAGNOSIS — I82511 Chronic embolism and thrombosis of right femoral vein: Secondary | ICD-10-CM | POA: Diagnosis not present

## 2023-12-15 DIAGNOSIS — F1910 Other psychoactive substance abuse, uncomplicated: Secondary | ICD-10-CM

## 2023-12-15 MED ORDER — BUPROPION HCL ER (XL) 150 MG PO TB24: 150.0000 mg | ORAL_TABLET | Freq: Every day | ORAL | 3 refills | Status: DC

## 2023-12-15 MED ORDER — APIXABAN 5 MG PO TABS: 5.0000 mg | ORAL_TABLET | Freq: Two times a day (BID) | ORAL | 5 refills | Status: DC

## 2023-12-15 MED ORDER — QUETIAPINE FUMARATE 25 MG PO TABS: 25.0000 mg | ORAL_TABLET | Freq: Every day | ORAL | 3 refills | Status: DC

## 2023-12-15 NOTE — H&P (View-Only) (Signed)
 New Patient Office Visit  Subjective:  Patient ID: Lance Cohen, male    DOB: 07-04-1971  Age: 53 y.o. MRN: [id number]  CC:  Chief Complaint  Patient presents with   Care Management    4 month f/u   Bipolar disoder    HPI Lance Cohen is a 54 y.o. male with past medical history of bipolar disorder, polysubstance abuse, chronic DVT, cervical and lumbar radiculopathy and tobacco abuse who presents for f/u of her chronic medical conditions.  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  He has had vascular surgery evaluation and was advised indefinite AC.  He is currently taking Eliquis for it.  Bipolar disorder: He used to see [facility] and has tried multiple SSRIs and other antipsychotics in the past, details unknown.  He has severe anxiety spells at times.  He later saw [facility] psychiatry, but could not keep follow-up due to virtual visit-he says that he does not have data plan in his phone to do virtual visits.  He was given Wellbutrin, but did not get refills.  Denies any SI or HI currently.  Polysubstance abuse: Chart review suggests history of cocaine abuse, but he states that he has recently quit it.  He also reports taking raw mushroom (hallucinogen) for back pain.  He takes alcohol when he has money, but denies drinking alcohol every day.  He smokes 0.25 pack/day.  Denies dyspnea or wheezing currently.  Chronic neck and back pain: He is followed by Dr. Franky Macho at [facility].  He was prescribed gabapentin by orthopedic surgeon.  Hematochezia: He reports intermittent rectal bleeding.  He has history of internal hemorrhoids.  Denies any rectal pain currently.  Denies melena. Hb was 15.4 in 09/24.  Past Medical History:  Diagnosis Date   Anxiety    Arthritis    Asthma    Bipolar 1 disorder (HCC)    Bradycardia    Carpal tunnel syndrome 2023   Chronic knee pain    COPD (chronic obstructive pulmonary disease) (HCC)     Depression    Dilated aortic root (HCC)    DVT (deep venous thrombosis) (HCC)    Dysrhythmia    H/o blood clots in right leg 2022   History of kidney stones    Hypertension    Insomnia    Medical history non-contributory    Sinus bradycardia    Spinal stenosis    Spinal stenosis    Spinal stenosis 2022   Suicide attempt (HCC)    2010 Intentional overdose attempt with Depakote.    Past Surgical History:  Procedure Laterality Date   COLONOSCOPY WITH PROPOFOL N/A 03/18/2019   Procedure: COLONOSCOPY WITH PROPOFOL;  Surgeon: Corbin Ade, MD;  Location: [facility];  Service: Endoscopy;  Laterality: N/A;  2:30pm   FOOT SURGERY     had peice of wire removed   IVC FILTER REMOVAL N/A 02/24/2017   Procedure: IVC Filter Removal;  Surgeon: Annice Needy, MD;  Location: [facility] INVASIVE CV LAB;  Service: Cardiovascular;  Laterality: N/A;   ORIF RADIAL FRACTURE Right 12/05/2020   Procedure: OPEN REDUCTION INTERNAL FIXATION (ORIF) RADIAL FRACTURE and ULNA FRACTURE;  Surgeon: Vickki Hearing, MD;  Location: [facility] ORS;  Service: Orthopedics;  Laterality: Right;   PERIPHERAL VASCULAR CATHETERIZATION Right 10/22/2016   Procedure: Thrombectomy, thrombolyisis;  Surgeon: Renford Dills, MD;  Location: [facility] INVASIVE CV LAB;  Service: Cardiovascular;  Laterality: Right;   PERIPHERAL VASCULAR  CATHETERIZATION N/A 10/22/2016   Procedure: IVC Filter Insertion;  Surgeon: Renford Dills, MD;  Location: [facility] INVASIVE CV LAB;  Service: Cardiovascular;  Laterality: N/A;   POLYPECTOMY  03/18/2019   Procedure: POLYPECTOMY;  Surgeon: Corbin Ade, MD;  Location: [facility];  Service: Endoscopy;;    Family History  Problem Relation Age of Onset   Cancer Mother    Heart attack Father    Colon cancer Neg Hx     Social History   Socioeconomic History   Marital status: Married    Spouse name: Not on file   Number of children: Not on file   Years of education: Not on file   Highest education level: Not  on file  Occupational History   Not on file  Tobacco Use   Smoking status: Every Day    Current packs/day: 0.50    Types: Cigarettes   Smokeless tobacco: Never  Vaping Use   Vaping status: Never Used  Substance and Sexual Activity   Alcohol use: Yes    Comment: seldom   Drug use: Yes    Types: Marijuana    Comment: smokes for pain. Cocaine + UDS 2023 pt thinks marijuana was laced. Denies cocaine use.   Sexual activity: Yes  Other Topics Concern   Not on file  Social History Narrative   Not on file   Social Drivers of Health   Financial Resource Strain: Not on file  Food Insecurity: Food Insecurity Present (02/27/2021)   Received from [facility], [facility]   Hunger Vital Sign    Worried About Running Out of Food in the Last Year: Sometimes true    Ran Out of Food in the Last Year: Often true  Transportation Needs: Not on file  Physical Activity: Not on file  Stress: Not on file  Social Connections: Unknown (03/05/2022)   Received from [facility], [facility]   Social Network    Social Network: Not on file  Intimate Partner Violence: Unknown (01/25/2022)   Received from [facility], [facility]   HITS    Physically Hurt: Not on file    Insult or Talk Down To: Not on file    Threaten Physical Harm: Not on file    Scream or Curse: Not on file    ROS Review of Systems  Constitutional:  Negative for chills and fever.  HENT:  Negative for congestion and sore throat.   Eyes:  Negative for pain and discharge.  Respiratory:  Negative for cough and shortness of breath.   Cardiovascular:  Negative for chest pain and palpitations.  Gastrointestinal:  Positive for blood in stool. Negative for diarrhea, nausea and vomiting.  Endocrine: Negative for polydipsia and polyuria.  Genitourinary:  Negative for dysuria and hematuria.  Musculoskeletal:  Positive for neck pain and neck stiffness.  Skin:  Negative for rash.  Neurological:  Negative for dizziness and  weakness.  Psychiatric/Behavioral:  Positive for agitation, decreased concentration and sleep disturbance. Negative for behavioral problems. The patient is nervous/anxious.     Objective:   Today's Vitals: BP (!) 88/60   Pulse 94   Ht 5\' 10"  (1.778 m)   Wt 183 lb 12.8 oz (83.4 kg)   SpO2 99%   BMI 26.37 kg/m   Physical Exam Vitals reviewed.  Constitutional:      General: He is not in acute distress.    Appearance: He is not diaphoretic.  HENT:     Head: Normocephalic and atraumatic.  Nose: Nose normal.     Mouth/Throat:     Mouth: Mucous membranes are moist.  Eyes:     General: No scleral icterus.    Extraocular Movements: Extraocular movements intact.  Cardiovascular:     Rate and Rhythm: Normal rate and regular rhythm.     Heart sounds: Normal heart sounds. No murmur heard. Pulmonary:     Breath sounds: Normal breath sounds. No wheezing or rales.  Musculoskeletal:     Cervical back: Neck supple. No tenderness.     Right lower leg: No edema.     Left lower leg: No edema.  Skin:    General: Skin is warm.     Findings: No rash.  Neurological:     General: No focal deficit present.     Mental Status: He is alert and oriented to person, place, and time.  Psychiatric:        Mood and Affect: Mood is anxious.        Behavior: Behavior is cooperative.     Assessment & Plan:   Problem List Items Addressed This Visit       Cardiovascular and Mediastinum   Chronic deep vein thrombosis (DVT) (HCC) - Primary   History of unprovoked DVT Continue Eliquis      Relevant Medications   apixaban (ELIQUIS) 5 MG TABS tablet     Nervous and Auditory   Lumbosacral radiculopathy   Has chronic low back pain Followed by Dr. Franky Macho at [facility] Was on gabapentin and chronic opioids, would avoid controlled medicines considering his polysubstance abuse history      Relevant Medications   buPROPion (WELLBUTRIN XL) 150 MG 24 hr tablet   QUEtiapine (SEROQUEL)  25 MG tablet     Other   Bipolar 1 disorder (HCC) (Chronic)   Uncontrolled, currently not on any treatment Used to see [facility] and later [facility] He was unable to do virtual visits and due to lack of in person psychiatry locally, started with Wellbutrin -can also help with tobacco cessation, compliance questionable, advised to remain compliant Added Seroquel 25 mg qHS Referred to [facility] psychiatry as he agrees for virtual visits Had tried to involve managed Medicaid team to help with necessary resources so he can avail virtual visit - but has poor f/u      Relevant Medications   buPROPion (WELLBUTRIN XL) 150 MG 24 hr tablet   QUEtiapine (SEROQUEL) 25 MG tablet   Tobacco abuse (Chronic)   Smokes about 0.25 pack/day  Asked about quitting: confirms that he/she currently smokes cigarettes Advise to quit smoking: Educated about QUITTING to reduce the risk of cancer, cardio and cerebrovascular disease. Assess willingness: Unwilling to quit at this time, but is working on cutting back. Assist with counseling and pharmacotherapy: Counseled for 5 minutes and literature provided. Arrange for follow up: follow up in 3 months and continue to offer help.      Polysubstance abuse (HCC)   History of cocaine abuse and hallucinogen use (mushroom) Denies recent use of cocaine Takes alcohol occasionally, denies binge drinking Advised to refrain from using cocaine, hallucinogens or other illicit substances       Outpatient Encounter Medications as of 12/15/2023  Medication Sig   EPINEPHrine 0.3 mg/0.3 mL IJ SOAJ injection Inject 0.3 mg into the muscle as needed for anaphylaxis.   gabapentin (NEURONTIN) 300 MG capsule Take 1 capsule (300 mg total) by mouth 3 (three) times daily.   methocarbamol (ROBAXIN) 750 MG tablet Take 1 tablet (  750 mg total) by mouth 4 (four) times daily.   QUEtiapine (SEROQUEL) 25 MG tablet Take 1 tablet (25 mg total) by mouth at bedtime.   SYMBICORT  80-4.5 MCG/ACT inhaler Inhale 2 puffs into the lungs 2 (two) times daily.   [DISCONTINUED] apixaban (ELIQUIS) 5 MG TABS tablet Take 1 tablet (5 mg total) by mouth 2 (two) times daily.   [DISCONTINUED] buPROPion (WELLBUTRIN XL) 150 MG 24 hr tablet Take 1 tablet (150 mg total) by mouth daily.   apixaban (ELIQUIS) 5 MG TABS tablet Take 1 tablet (5 mg total) by mouth 2 (two) times daily.   buPROPion (WELLBUTRIN XL) 150 MG 24 hr tablet Take 1 tablet (150 mg total) by mouth daily.   No facility-administered encounter medications on file as of 12/15/2023.    Follow-up: Return in about 4 months (around 04/13/2024) for MDD and DVT.   Anabel Halon, MD

## 2023-12-15 NOTE — Progress Notes (Signed)
   Ht 5\' 10"  (1.778 m)   Wt 180 lb (81.6 kg)   BMI 25.83 kg/m   Body mass index is 25.83 kg/m.  Chief Complaint  Patient presents with   Neck Pain   Results    Review MRI     No diagnosis found.  DOI/DOS/ Date: na  Unchanged

## 2023-12-15 NOTE — Assessment & Plan Note (Signed)
 History of cocaine abuse and hallucinogen use (mushroom) Denies recent use of cocaine Takes alcohol occasionally, denies binge drinking Advised to refrain from using cocaine, hallucinogens or other illicit substances

## 2023-12-15 NOTE — Progress Notes (Signed)
 New Patient Office Visit  Subjective:  Patient ID: Lance Cohen, male    DOB: 07-04-1971  Age: 53 y.o. 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 male with past medical history of bipolar disorder, polysubstance abuse, chronic DVT, cervical and lumbar radiculopathy and tobacco abuse who presents for f/u of her chronic medical conditions.  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  He has had vascular surgery evaluation and was advised indefinite AC.  He is currently taking Eliquis for it.  Bipolar disorder: He used to see [facility] and has tried multiple SSRIs and other antipsychotics in the past, details unknown.  He has severe anxiety spells at times.  He later saw [facility] psychiatry, but could not keep follow-up due to virtual visit-he says that he does not have data plan in his phone to do virtual visits.  He was given Wellbutrin, but did not get refills.  Denies any SI or HI currently.  Polysubstance abuse: Chart review suggests history of cocaine abuse, but he states that he has recently quit it.  He also reports taking raw mushroom (hallucinogen) for back pain.  He takes alcohol when he has money, but denies drinking alcohol every day.  He smokes 0.25 pack/day.  Denies dyspnea or wheezing currently.  Chronic neck and back pain: He is followed by Dr. Franky Macho at [facility].  He was prescribed gabapentin by orthopedic surgeon.  Hematochezia: He reports intermittent rectal bleeding.  He has history of internal hemorrhoids.  Denies any rectal pain currently.  Denies melena. Hb was 15.4 in 09/24.  Past Medical History:  Diagnosis Date   Anxiety    Arthritis    Asthma    Bipolar 1 disorder (HCC)    Bradycardia    Carpal tunnel syndrome 2023   Chronic knee pain    COPD (chronic obstructive pulmonary disease) (HCC)     Depression    Dilated aortic root (HCC)    DVT (deep venous thrombosis) (HCC)    Dysrhythmia    H/o blood clots in right leg 2022   History of kidney stones    Hypertension    Insomnia    Medical history non-contributory    Sinus bradycardia    Spinal stenosis    Spinal stenosis    Spinal stenosis 2022   Suicide attempt (HCC)    2010 Intentional overdose attempt with Depakote.    Past Surgical History:  Procedure Laterality Date   COLONOSCOPY WITH PROPOFOL N/A 03/18/2019   Procedure: COLONOSCOPY WITH PROPOFOL;  Surgeon: Corbin Ade, MD;  Location: [facility];  Service: Endoscopy;  Laterality: N/A;  2:30pm   FOOT SURGERY     had peice of wire removed   IVC FILTER REMOVAL N/A 02/24/2017   Procedure: IVC Filter Removal;  Surgeon: Annice Needy, MD;  Location: [facility] INVASIVE CV LAB;  Service: Cardiovascular;  Laterality: N/A;   ORIF RADIAL FRACTURE Right 12/05/2020   Procedure: OPEN REDUCTION INTERNAL FIXATION (ORIF) RADIAL FRACTURE and ULNA FRACTURE;  Surgeon: Vickki Hearing, MD;  Location: [facility] ORS;  Service: Orthopedics;  Laterality: Right;   PERIPHERAL VASCULAR CATHETERIZATION Right 10/22/2016   Procedure: Thrombectomy, thrombolyisis;  Surgeon: Renford Dills, MD;  Location: [facility] INVASIVE CV LAB;  Service: Cardiovascular;  Laterality: Right;   PERIPHERAL VASCULAR  CATHETERIZATION N/A 10/22/2016   Procedure: IVC Filter Insertion;  Surgeon: Renford Dills, MD;  Location: [facility] INVASIVE CV LAB;  Service: Cardiovascular;  Laterality: N/A;   POLYPECTOMY  03/18/2019   Procedure: POLYPECTOMY;  Surgeon: Corbin Ade, MD;  Location: [facility];  Service: Endoscopy;;    Family History  Problem Relation Age of Onset   Cancer Mother    Heart attack Father    Colon cancer Neg Hx     Social History   Socioeconomic History   Marital status: Married    Spouse name: Not on file   Number of children: Not on file   Years of education: Not on file   Highest education level: Not  on file  Occupational History   Not on file  Tobacco Use   Smoking status: Every Day    Current packs/day: 0.50    Types: Cigarettes   Smokeless tobacco: Never  Vaping Use   Vaping status: Never Used  Substance and Sexual Activity   Alcohol use: Yes    Comment: seldom   Drug use: Yes    Types: Marijuana    Comment: smokes for pain. Cocaine + UDS 2023 pt thinks marijuana was laced. Denies cocaine use.   Sexual activity: Yes  Other Topics Concern   Not on file  Social History Narrative   Not on file   Social Drivers of Health   Financial Resource Strain: Not on file  Food Insecurity: Food Insecurity Present (02/27/2021)   Received from [facility], [facility]   Hunger Vital Sign    Worried About Running Out of Food in the Last Year: Sometimes true    Ran Out of Food in the Last Year: Often true  Transportation Needs: Not on file  Physical Activity: Not on file  Stress: Not on file  Social Connections: Unknown (03/05/2022)   Received from [facility], [facility]   Social Network    Social Network: Not on file  Intimate Partner Violence: Unknown (01/25/2022)   Received from [facility], [facility]   HITS    Physically Hurt: Not on file    Insult or Talk Down To: Not on file    Threaten Physical Harm: Not on file    Scream or Curse: Not on file    ROS Review of Systems  Constitutional:  Negative for chills and fever.  HENT:  Negative for congestion and sore throat.   Eyes:  Negative for pain and discharge.  Respiratory:  Negative for cough and shortness of breath.   Cardiovascular:  Negative for chest pain and palpitations.  Gastrointestinal:  Positive for blood in stool. Negative for diarrhea, nausea and vomiting.  Endocrine: Negative for polydipsia and polyuria.  Genitourinary:  Negative for dysuria and hematuria.  Musculoskeletal:  Positive for neck pain and neck stiffness.  Skin:  Negative for rash.  Neurological:  Negative for dizziness and  weakness.  Psychiatric/Behavioral:  Positive for agitation, decreased concentration and sleep disturbance. Negative for behavioral problems. The patient is nervous/anxious.     Objective:   Today's Vitals: BP (!) 88/60   Pulse 94   Ht 5\' 10"  (1.778 m)   Wt 183 lb 12.8 oz (83.4 kg)   SpO2 99%   BMI 26.37 kg/m   Physical Exam Vitals reviewed.  Constitutional:      General: He is not in acute distress.    Appearance: He is not diaphoretic.  HENT:     Head: Normocephalic and atraumatic.  Nose: Nose normal.     Mouth/Throat:     Mouth: Mucous membranes are moist.  Eyes:     General: No scleral icterus.    Extraocular Movements: Extraocular movements intact.  Cardiovascular:     Rate and Rhythm: Normal rate and regular rhythm.     Heart sounds: Normal heart sounds. No murmur heard. Pulmonary:     Breath sounds: Normal breath sounds. No wheezing or rales.  Musculoskeletal:     Cervical back: Neck supple. No tenderness.     Right lower leg: No edema.     Left lower leg: No edema.  Skin:    General: Skin is warm.     Findings: No rash.  Neurological:     General: No focal deficit present.     Mental Status: He is alert and oriented to person, place, and time.  Psychiatric:        Mood and Affect: Mood is anxious.        Behavior: Behavior is cooperative.     Assessment & Plan:   Problem List Items Addressed This Visit       Cardiovascular and Mediastinum   Chronic deep vein thrombosis (DVT) (HCC) - Primary   History of unprovoked DVT Continue Eliquis      Relevant Medications   apixaban (ELIQUIS) 5 MG TABS tablet     Nervous and Auditory   Lumbosacral radiculopathy   Has chronic low back pain Followed by Dr. Franky Macho at [facility] Was on gabapentin and chronic opioids, would avoid controlled medicines considering his polysubstance abuse history      Relevant Medications   buPROPion (WELLBUTRIN XL) 150 MG 24 hr tablet   QUEtiapine (SEROQUEL)  25 MG tablet     Other   Bipolar 1 disorder (HCC) (Chronic)   Uncontrolled, currently not on any treatment Used to see [facility] and later [facility] He was unable to do virtual visits and due to lack of in person psychiatry locally, started with Wellbutrin -can also help with tobacco cessation, compliance questionable, advised to remain compliant Added Seroquel 25 mg qHS Referred to [facility] psychiatry as he agrees for virtual visits Had tried to involve managed Medicaid team to help with necessary resources so he can avail virtual visit - but has poor f/u      Relevant Medications   buPROPion (WELLBUTRIN XL) 150 MG 24 hr tablet   QUEtiapine (SEROQUEL) 25 MG tablet   Tobacco abuse (Chronic)   Smokes about 0.25 pack/day  Asked about quitting: confirms that he/she currently smokes cigarettes Advise to quit smoking: Educated about QUITTING to reduce the risk of cancer, cardio and cerebrovascular disease. Assess willingness: Unwilling to quit at this time, but is working on cutting back. Assist with counseling and pharmacotherapy: Counseled for 5 minutes and literature provided. Arrange for follow up: follow up in 3 months and continue to offer help.      Polysubstance abuse (HCC)   History of cocaine abuse and hallucinogen use (mushroom) Denies recent use of cocaine Takes alcohol occasionally, denies binge drinking Advised to refrain from using cocaine, hallucinogens or other illicit substances       Outpatient Encounter Medications as of 12/15/2023  Medication Sig   EPINEPHrine 0.3 mg/0.3 mL IJ SOAJ injection Inject 0.3 mg into the muscle as needed for anaphylaxis.   gabapentin (NEURONTIN) 300 MG capsule Take 1 capsule (300 mg total) by mouth 3 (three) times daily.   methocarbamol (ROBAXIN) 750 MG tablet Take 1 tablet (  750 mg total) by mouth 4 (four) times daily.   QUEtiapine (SEROQUEL) 25 MG tablet Take 1 tablet (25 mg total) by mouth at bedtime.   SYMBICORT  80-4.5 MCG/ACT inhaler Inhale 2 puffs into the lungs 2 (two) times daily.   [DISCONTINUED] apixaban (ELIQUIS) 5 MG TABS tablet Take 1 tablet (5 mg total) by mouth 2 (two) times daily.   [DISCONTINUED] buPROPion (WELLBUTRIN XL) 150 MG 24 hr tablet Take 1 tablet (150 mg total) by mouth daily.   apixaban (ELIQUIS) 5 MG TABS tablet Take 1 tablet (5 mg total) by mouth 2 (two) times daily.   buPROPion (WELLBUTRIN XL) 150 MG 24 hr tablet Take 1 tablet (150 mg total) by mouth daily.   No facility-administered encounter medications on file as of 12/15/2023.    Follow-up: Return in about 4 months (around 04/13/2024) for MDD and DVT.   Anabel Halon, MD

## 2023-12-15 NOTE — Progress Notes (Signed)
   Ht 5\' 10"  (1.778 m)   Wt 180 lb (81.6 kg)   BMI 25.83 kg/m   Body mass index is 25.83 kg/m.  Chief Complaint  Patient presents with   Neck Pain   Results    Review MRI     Encounter Diagnosis  Name Primary?   Stenosis, cervical spine Yes    DOI/DOS/ Date: na  Unchanged  Lance Cohen is here for his follow-up appointment he had an MRI of his neck which shows moderate to severe cervical disc disease, some central canal stenosis  Recommendation is for referral for management and treatment  MRI independently reviewed concur with findings of Some congenital spinal narrowing disc degeneration C4-7 foraminal disc severe neural foraminal stenosis at C4 on the right and bilateral at C6  IMPRESSION: 1. Congenital cervical spinal canal narrowing, with exacerbated of mild spinal stenosis by disc degeneration at both C4-C5 and C6-C7, stable since 2021. Mild spinal cord mass effect at both of those levels but no cord signal abnormality. 2. Superimposed foraminal disc and endplate degeneration with widespread moderate and severe neural foraminal stenosis, severe and progressed since 2021 at the right C4 and bilateral C6 nerve levels.     Electronically Signed   By: Odessa Fleming M.D.   On: 12/11/2023 12:48

## 2023-12-15 NOTE — Assessment & Plan Note (Addendum)
Smokes about 0.25 pack/day  Asked about quitting: confirms that he/she currently smokes cigarettes Advise to quit smoking: Educated about QUITTING to reduce the risk of cancer, cardio and cerebrovascular disease. Assess willingness: Unwilling to quit at this time, but is working on cutting back. Assist with counseling and pharmacotherapy: Counseled for 5 minutes and literature provided. Arrange for follow up: follow up in 3 months and continue to offer help. 

## 2023-12-15 NOTE — Patient Instructions (Signed)
 We are referring you to [facility] from [facility] address is [address] [place] The phone number is [phone]  The office will call you with an appointment Dr. Christell Constant

## 2023-12-15 NOTE — Assessment & Plan Note (Signed)
History of unprovoked DVT Continue Eliquis

## 2023-12-15 NOTE — Patient Instructions (Signed)
 Please start taking Wellbutrin and Seroquel as prescribed.  Please continue taking Eliquis as prescribed.  Please continue to cut down smoking.

## 2023-12-15 NOTE — Assessment & Plan Note (Signed)
Has chronic low back pain Followed by Dr. Franky Macho at [facility] Was on gabapentin and chronic opioids, would avoid controlled medicines considering his polysubstance abuse history

## 2023-12-15 NOTE — Assessment & Plan Note (Addendum)
 Uncontrolled, currently not on any treatment Used to see [facility] and later [facility] psychiatry He was unable to do virtual visits and due to lack of in person psychiatry locally, started with Wellbutrin -can also help with tobacco cessation, compliance questionable, advised to remain compliant Added Seroquel 25 mg qHS Referred to [facility] psychiatry as he agrees for virtual visits Had tried to involve managed Medicaid team to help with necessary resources so he can avail virtual visit - but has poor f/u

## 2023-12-16 DIAGNOSIS — M51372 Other intervertebral disc degeneration, lumbosacral region with discogenic back pain and lower extremity pain: Secondary | ICD-10-CM | POA: Diagnosis not present

## 2023-12-17 DIAGNOSIS — M51372 Other intervertebral disc degeneration, lumbosacral region with discogenic back pain and lower extremity pain: Secondary | ICD-10-CM | POA: Diagnosis not present

## 2023-12-18 DIAGNOSIS — M51372 Other intervertebral disc degeneration, lumbosacral region with discogenic back pain and lower extremity pain: Secondary | ICD-10-CM | POA: Diagnosis not present

## 2023-12-19 DIAGNOSIS — M51372 Other intervertebral disc degeneration, lumbosacral region with discogenic back pain and lower extremity pain: Secondary | ICD-10-CM | POA: Diagnosis not present

## 2023-12-20 DIAGNOSIS — M51372 Other intervertebral disc degeneration, lumbosacral region with discogenic back pain and lower extremity pain: Secondary | ICD-10-CM | POA: Diagnosis not present

## 2023-12-20 DIAGNOSIS — Z419 Encounter for procedure for purposes other than remedying health state, unspecified: Secondary | ICD-10-CM | POA: Diagnosis not present

## 2023-12-21 DIAGNOSIS — M51372 Other intervertebral disc degeneration, lumbosacral region with discogenic back pain and lower extremity pain: Secondary | ICD-10-CM | POA: Diagnosis not present

## 2023-12-22 DIAGNOSIS — M51372 Other intervertebral disc degeneration, lumbosacral region with discogenic back pain and lower extremity pain: Secondary | ICD-10-CM | POA: Diagnosis not present

## 2023-12-23 ENCOUNTER — Encounter: Payer: Self-pay | Admitting: *Deleted

## 2023-12-23 ENCOUNTER — Telehealth: Payer: Self-pay | Admitting: *Deleted

## 2023-12-23 DIAGNOSIS — M51372 Other intervertebral disc degeneration, lumbosacral region with discogenic back pain and lower extremity pain: Secondary | ICD-10-CM | POA: Diagnosis not present

## 2023-12-23 NOTE — OR Nursing (Signed)
 Patient called to cancel procedure for PAT on 14th. Due to another appointment . He is calling the doctors office to reschedule.

## 2023-12-23 NOTE — Telephone Encounter (Signed)
 Pt has been rescheduled until 01/19/24. Updated instructions mailed to pt

## 2023-12-23 NOTE — Telephone Encounter (Signed)
 Message Received: Today Lance Cohen, Lance Cohen, Azarian Starace L, LPN This patient called hospital states he is having dental restorations and can't come to PAT.  Unsure if  he wants to cancel for now or reschedule.  I have placed him in the depot, please let me know status.  Thanks,

## 2023-12-24 DIAGNOSIS — M51372 Other intervertebral disc degeneration, lumbosacral region with discogenic back pain and lower extremity pain: Secondary | ICD-10-CM | POA: Diagnosis not present

## 2023-12-25 ENCOUNTER — Ambulatory Visit: Payer: Medicaid Other | Admitting: Orthopedic Surgery

## 2023-12-25 DIAGNOSIS — M51372 Other intervertebral disc degeneration, lumbosacral region with discogenic back pain and lower extremity pain: Secondary | ICD-10-CM | POA: Diagnosis not present

## 2023-12-26 DIAGNOSIS — M51372 Other intervertebral disc degeneration, lumbosacral region with discogenic back pain and lower extremity pain: Secondary | ICD-10-CM | POA: Diagnosis not present

## 2023-12-27 DIAGNOSIS — M51372 Other intervertebral disc degeneration, lumbosacral region with discogenic back pain and lower extremity pain: Secondary | ICD-10-CM | POA: Diagnosis not present

## 2023-12-28 DIAGNOSIS — M51372 Other intervertebral disc degeneration, lumbosacral region with discogenic back pain and lower extremity pain: Secondary | ICD-10-CM | POA: Diagnosis not present

## 2023-12-29 DIAGNOSIS — M51372 Other intervertebral disc degeneration, lumbosacral region with discogenic back pain and lower extremity pain: Secondary | ICD-10-CM | POA: Diagnosis not present

## 2023-12-29 NOTE — H&P (Signed)
 Date: August 18, 2023   Patient: Lance Cohen  PID: [id number]  DOB: Feb 02, 1971  SEX: Male   Patient referred by DDS for extraction teeth# 3, 4, 5, 6, 10, 11, 12, 13, 14, 31.  CC: Bad teeth.  Past Medical History:  Irregular Heart Beat, Asthma, Smoker, Arthritis, Drug Abuse/Alcohol Abuse, Weed, Spinal Stenosis, Sleep Apnea, Bradycardia, HTN, DVT, Bipolar disease    Medications: Eliquis, Albuterol, Wellbutrin    Allergies:     Penicillin, Sulfa, Sulfites    Surgeries:   Colonoscopy, Arm surgery     Social History       Smoking:            Alcohol: Drug use:                             Exam: BMI25. Multiple caries teeth# 3, 4, 5, 6, 10, 11, 12, 13, 14, 31.   No purulence, edema, fluctuance, trismus. Oral cancer screening negative. Pharynx clear. No lymphadenopathy.  Panorex:Multiple caries teeth# 3, 4, 5, 6, 10, 11, 12, 13, 14, 31. Impacted # 16, 17.   Assessment: ASA 2. Non-restorable  teeth# 3, 4, 5, 6, 10, 11, 12, 13, 14, 31.             Plan: MD Clearance  2. Extraction Teeth #  3, 4, 5, 6, 10, 11, 12, 13, 14, 31. Alveoloplasty.   Hospital day surgery.     Rx: n               Risks and complications explained. Questions answered.   Georgia Lopes, DMD

## 2023-12-30 ENCOUNTER — Telehealth: Payer: Self-pay | Admitting: Orthopedic Surgery

## 2023-12-30 DIAGNOSIS — M51372 Other intervertebral disc degeneration, lumbosacral region with discogenic back pain and lower extremity pain: Secondary | ICD-10-CM | POA: Diagnosis not present

## 2023-12-30 NOTE — Telephone Encounter (Signed)
 Dr. Mort Sawyers pt - returned the pt's call from 12/29/23, he stated that he is scheduled to see a spine surgeon on 3/26, but he is having numbness in his right arm, he thinks from the spine issues.  [phone]

## 2023-12-31 ENCOUNTER — Other Ambulatory Visit: Payer: Self-pay

## 2023-12-31 ENCOUNTER — Encounter (HOSPITAL_COMMUNITY): Payer: Self-pay | Admitting: Oral Surgery

## 2023-12-31 DIAGNOSIS — M51372 Other intervertebral disc degeneration, lumbosacral region with discogenic back pain and lower extremity pain: Secondary | ICD-10-CM | POA: Diagnosis not present

## 2023-12-31 NOTE — Progress Notes (Signed)
 PCP - Anabel Halon, MD  Cardiologist - Antoine Poche, MD   PPM/ICD - denies Device Orders - n/a Rep Notified - n/a  Chest x-ray - 06-22-23 EKG - 06-22-23 Stress Test -  ECHO - 06-17-21 Cardiac Cath -   CPAP - denies  Dm -denies  Blood Thinner Instructions: apixaban (ELIQUIS) per patient last dose 12-30-23 Aspirin Instructions: n/a  ERAS Protcol - NPO  COVID TEST- n/a  Anesthesia review: no  Patient verbally denies any shortness of breath, fever, cough and chest pain during phone call   -------------  SDW INSTRUCTIONS given:  Your procedure is scheduled on January 02, 2024.  Report to [facility] Main Entrance "A" at 7:45 A.M., and check in at the Admitting office.  Call this number if you have problems the morning of surgery:  [phone]   Remember:  Do not eat or drink after midnight the night before your surgery     Take these medicines the morning of surgery with A SIP OF WATER  buPROPion (WELLBUTRIN XL)  EPINEPHrine injection  gabapentin (NEURONTIN)  methocarbamol (ROBAXIN)  SYMBICORT   As of today, STOP taking any Aspirin (unless otherwise instructed by your surgeon) Aleve, Naproxen, Ibuprofen, Motrin, Advil, Goody's, BC's, all herbal medications, fish oil, and all vitamins.                      Do not wear jewelry, make up, or nail polish            Do not wear lotions, powders, perfumes/colognes, or deodorant.            Do not shave 48 hours prior to surgery.  Men may shave face and neck.            Do not bring valuables to the hospital.            [facility] is not responsible for any belongings or valuables.  Do NOT Smoke (Tobacco/Vaping) 24 hours prior to your procedure If you use a CPAP at night, you may bring all equipment for your overnight stay.   Contacts, glasses, dentures or bridgework may not be worn into surgery.      For patients admitted to the hospital, discharge time will be determined by your treatment team.   Patients  discharged the day of surgery will not be allowed to drive home, and someone needs to stay with them for 24 hours.    Special instructions:   [facility]- Preparing For Surgery  Before surgery, you can play an important role. Because skin is not sterile, your skin needs to be as free of germs as possible. You can reduce the number of germs on your skin by washing with CHG (chlorahexidine gluconate) Soap before surgery.  CHG is an antiseptic cleaner which kills germs and bonds with the skin to continue killing germs even after washing.    Oral Hygiene is also important to reduce your risk of infection.  Remember - BRUSH YOUR TEETH THE MORNING OF SURGERY WITH YOUR REGULAR TOOTHPASTE  Please do not use if you have an allergy to CHG or antibacterial soaps. If your skin becomes reddened/irritated stop using the CHG.  Do not shave (including legs and underarms) for at least 48 hours prior to first CHG shower. It is OK to shave your face.  Please follow these instructions carefully.   Shower the NIGHT BEFORE SURGERY and the MORNING OF SURGERY with DIAL Soap.   Pat yourself dry  with a CLEAN TOWEL.  Wear CLEAN PAJAMAS to bed the night before surgery  Place CLEAN SHEETS on your bed the night of your first shower and DO NOT SLEEP WITH PETS.   Day of Surgery: Please shower morning of surgery  Wear Clean/Comfortable clothing the morning of surgery Do not apply any deodorants/lotions.   Remember to brush your teeth WITH YOUR REGULAR TOOTHPASTE.   Questions were answered. Patient verbalized understanding of instructions.

## 2023-12-31 NOTE — Telephone Encounter (Signed)
 Lance Cohen added him to wait list and told me how to do that going forward.

## 2023-12-31 NOTE — Telephone Encounter (Signed)
 I told him yes the numbness in his arm Dr Romeo Apple thinks is from his neck and asked if he is on a cancellation list. He said he is not. I will message Cassandra to see if she can add to a cancellation list.

## 2024-01-01 DIAGNOSIS — M51372 Other intervertebral disc degeneration, lumbosacral region with discogenic back pain and lower extremity pain: Secondary | ICD-10-CM | POA: Diagnosis not present

## 2024-01-02 ENCOUNTER — Encounter (HOSPITAL_COMMUNITY)
Admission: RE | Disposition: A | Discharge status: Home / Self Care | Payer: Self-pay | Source: Home / Self Care | Attending: Oral Surgery

## 2024-01-02 ENCOUNTER — Ambulatory Visit (HOSPITAL_BASED_OUTPATIENT_CLINIC_OR_DEPARTMENT_OTHER): Admitting: Anesthesiology

## 2024-01-02 ENCOUNTER — Ambulatory Visit (HOSPITAL_COMMUNITY)
Admission: RE | Admit: 2024-01-02 | Discharge: 2024-01-02 | Disposition: A | Discharge status: Home / Self Care | Payer: Medicaid Other | Attending: Oral Surgery | Admitting: Oral Surgery

## 2024-01-02 ENCOUNTER — Inpatient Hospital Stay (HOSPITAL_COMMUNITY)
Admission: RE | Admit: 2024-01-02 | Discharge: 2024-01-02 | Disposition: A | Discharge status: Home / Self Care | Payer: Medicaid Other | Source: Ambulatory Visit

## 2024-01-02 ENCOUNTER — Other Ambulatory Visit: Payer: Self-pay

## 2024-01-02 ENCOUNTER — Encounter (HOSPITAL_COMMUNITY): Payer: Self-pay | Admitting: Oral Surgery

## 2024-01-02 ENCOUNTER — Ambulatory Visit (HOSPITAL_COMMUNITY): Admitting: Anesthesiology

## 2024-01-02 DIAGNOSIS — Z9151 Personal history of suicidal behavior: Secondary | ICD-10-CM | POA: Insufficient documentation

## 2024-01-02 DIAGNOSIS — F419 Anxiety disorder, unspecified: Secondary | ICD-10-CM | POA: Insufficient documentation

## 2024-01-02 DIAGNOSIS — K029 Dental caries, unspecified: Secondary | ICD-10-CM | POA: Diagnosis not present

## 2024-01-02 DIAGNOSIS — Z7689 Persons encountering health services in other specified circumstances: Secondary | ICD-10-CM | POA: Diagnosis not present

## 2024-01-02 DIAGNOSIS — J45909 Unspecified asthma, uncomplicated: Secondary | ICD-10-CM | POA: Diagnosis not present

## 2024-01-02 DIAGNOSIS — G40909 Epilepsy, unspecified, not intractable, without status epilepticus: Secondary | ICD-10-CM | POA: Diagnosis not present

## 2024-01-02 DIAGNOSIS — I82501 Chronic embolism and thrombosis of unspecified deep veins of right lower extremity: Secondary | ICD-10-CM | POA: Insufficient documentation

## 2024-01-02 DIAGNOSIS — Z79899 Other long term (current) drug therapy: Secondary | ICD-10-CM | POA: Insufficient documentation

## 2024-01-02 DIAGNOSIS — F17210 Nicotine dependence, cigarettes, uncomplicated: Secondary | ICD-10-CM | POA: Diagnosis not present

## 2024-01-02 DIAGNOSIS — F319 Bipolar disorder, unspecified: Secondary | ICD-10-CM | POA: Diagnosis not present

## 2024-01-02 DIAGNOSIS — M51372 Other intervertebral disc degeneration, lumbosacral region with discogenic back pain and lower extremity pain: Secondary | ICD-10-CM | POA: Diagnosis not present

## 2024-01-02 DIAGNOSIS — I10 Essential (primary) hypertension: Secondary | ICD-10-CM | POA: Insufficient documentation

## 2024-01-02 DIAGNOSIS — K921 Melena: Secondary | ICD-10-CM | POA: Insufficient documentation

## 2024-01-02 DIAGNOSIS — Z7901 Long term (current) use of anticoagulants: Secondary | ICD-10-CM | POA: Insufficient documentation

## 2024-01-02 DIAGNOSIS — J449 Chronic obstructive pulmonary disease, unspecified: Secondary | ICD-10-CM | POA: Diagnosis not present

## 2024-01-02 HISTORY — PX: TOOTH EXTRACTION: SHX859

## 2024-01-02 LAB — COMPREHENSIVE METABOLIC PANEL
ALT: 13 U/L (ref 0–44)
AST: 24 U/L (ref 15–41)
Albumin: 3.7 g/dL (ref 3.5–5.0)
Alkaline Phosphatase: 62 U/L (ref 38–126)
Anion gap: 9 (ref 5–15)
BUN: 12 mg/dL (ref 6–20)
CO2: 23 mmol/L (ref 22–32)
Calcium: 8.7 mg/dL — ABNORMAL LOW (ref 8.9–10.3)
Chloride: 106 mmol/L (ref 98–111)
Creatinine, Ser: 1.01 mg/dL (ref 0.61–1.24)
GFR, Estimated: >60 mL/min (ref >60)
Glucose, Bld: 96 mg/dL (ref 70–99)
Potassium: 3.9 mmol/L (ref 3.5–5.1)
Sodium: 138 mmol/L (ref 135–145)
Total Bilirubin: 1 mg/dL (ref 0.0–1.2)
Total Protein: 6.6 g/dL (ref 6.5–8.1)

## 2024-01-02 LAB — CBC
HCT: 42.3 % (ref 39.0–52.0)
Hemoglobin: 14.7 g/dL (ref 13.0–17.0)
MCH: 33.7 pg (ref 26.0–34.0)
MCHC: 34.8 g/dL (ref 30.0–36.0)
MCV: 97 fL (ref 80.0–100.0)
Platelets: 264 10*3/uL (ref 150–400)
RBC: 4.36 MIL/uL (ref 4.22–5.81)
RDW: 11.9 % (ref 11.5–15.5)
WBC: 8.8 10*3/uL (ref 4.0–10.5)
nRBC: 0 % (ref 0.0–0.2)

## 2024-01-02 SURGERY — DENTAL RESTORATION/EXTRACTIONS
Anesthesia: General | Site: Mouth

## 2024-01-02 MED ORDER — ORAL CARE MOUTH RINSE: 15.0000 mL | Freq: Once | OROMUCOSAL | Status: AC

## 2024-01-02 MED ORDER — OXYCODONE HCL 5 MG PO TABS: 5.0000 mg | ORAL_TABLET | Freq: Once | ORAL | Status: DC | PRN

## 2024-01-02 MED ORDER — CLINDAMYCIN PHOSPHATE 600 MG/50ML IV SOLN
600.0000 mg | INTRAVENOUS | Status: AC
  Administered 2024-01-02: 600 mg via INTRAVENOUS
  Filled 2024-01-02: qty 50

## 2024-01-02 MED ORDER — OXYMETAZOLINE HCL 0.05 % NA SOLN
NASAL | Status: DC
Start: 2024-01-02 — End: 2024-01-02
  Filled 2024-01-02: qty 30

## 2024-01-02 MED ORDER — CLINDAMYCIN HCL 300 MG PO CAPS: 300.0000 mg | ORAL_CAPSULE | Freq: Three times a day (TID) | ORAL | 0 refills | Status: DC

## 2024-01-02 MED ORDER — ROCURONIUM BROMIDE 10 MG/ML (PF) SYRINGE
PREFILLED_SYRINGE | INTRAVENOUS | Status: DC | PRN
  Administered 2024-01-02: 50 mg via INTRAVENOUS

## 2024-01-02 MED ORDER — OXYCODONE-ACETAMINOPHEN 5-325 MG PO TABS: 1.0000 | ORAL_TABLET | ORAL | 0 refills | Status: AC | PRN

## 2024-01-02 MED ORDER — AMISULPRIDE (ANTIEMETIC) 5 MG/2ML IV SOLN: 10.0000 mg | Freq: Once | INTRAVENOUS | Status: DC | PRN

## 2024-01-02 MED ORDER — LIDOCAINE-EPINEPHRINE 2 %-1:100000 IJ SOLN
INTRAMUSCULAR | Status: AC
  Filled 2024-01-02: qty 1

## 2024-01-02 MED ORDER — ACETAMINOPHEN 500 MG PO TABS
1000.0000 mg | ORAL_TABLET | Freq: Once | ORAL | Status: AC
  Administered 2024-01-02: 1000 mg via ORAL
  Filled 2024-01-02: qty 2

## 2024-01-02 MED ORDER — FENTANYL CITRATE (PF) 250 MCG/5ML IJ SOLN
INTRAMUSCULAR | Status: AC
  Filled 2024-01-02: qty 5

## 2024-01-02 MED ORDER — SUGAMMADEX SODIUM 200 MG/2ML IV SOLN
INTRAVENOUS | Status: DC | PRN
Start: 2024-01-02 — End: 2024-01-02
  Administered 2024-01-02: 200 mg via INTRAVENOUS

## 2024-01-02 MED ORDER — SODIUM CHLORIDE 0.9 % IR SOLN
Status: DC | PRN
  Administered 2024-01-02: 1000 mL

## 2024-01-02 MED ORDER — PROPOFOL 10 MG/ML IV BOLUS
INTRAVENOUS | Status: DC | PRN
  Administered 2024-01-02: 200 mg via INTRAVENOUS

## 2024-01-02 MED ORDER — LACTATED RINGERS IV SOLN: INTRAVENOUS | Status: DC

## 2024-01-02 MED ORDER — MIDAZOLAM HCL 2 MG/2ML IJ SOLN
INTRAMUSCULAR | Status: DC | PRN
  Administered 2024-01-02: 2 mg via INTRAVENOUS

## 2024-01-02 MED ORDER — DEXMEDETOMIDINE HCL IN NACL 80 MCG/20ML IV SOLN: INTRAVENOUS | Status: DC | PRN

## 2024-01-02 MED ORDER — LIDOCAINE 2% (20 MG/ML) 5 ML SYRINGE
INTRAMUSCULAR | Status: DC | PRN
  Administered 2024-01-02: 60 mg via INTRAVENOUS

## 2024-01-02 MED ORDER — FENTANYL CITRATE (PF) 100 MCG/2ML IJ SOLN
INTRAMUSCULAR | Status: DC
Start: 2024-01-02 — End: 2024-01-02
  Filled 2024-01-02: qty 2

## 2024-01-02 MED ORDER — KETOROLAC TROMETHAMINE 30 MG/ML IJ SOLN
INTRAMUSCULAR | Status: DC | PRN
  Administered 2024-01-02: 30 mg via INTRAVENOUS

## 2024-01-02 MED ORDER — FENTANYL CITRATE (PF) 100 MCG/2ML IJ SOLN
25.0000 ug | INTRAMUSCULAR | Status: DC | PRN
  Administered 2024-01-02: 50 ug via INTRAVENOUS

## 2024-01-02 MED ORDER — CHLORHEXIDINE GLUCONATE 0.12 % MT SOLN
15.0000 mL | Freq: Once | OROMUCOSAL | Status: AC
  Administered 2024-01-02: 15 mL via OROMUCOSAL
  Filled 2024-01-02: qty 15

## 2024-01-02 MED ORDER — MIDAZOLAM HCL 2 MG/2ML IJ SOLN
INTRAMUSCULAR | Status: AC
  Filled 2024-01-02: qty 2

## 2024-01-02 MED ORDER — FENTANYL CITRATE (PF) 250 MCG/5ML IJ SOLN
INTRAMUSCULAR | Status: DC | PRN
  Administered 2024-01-02: 50 ug via INTRAVENOUS

## 2024-01-02 MED ORDER — 0.9 % SODIUM CHLORIDE (POUR BTL) OPTIME
TOPICAL | Status: DC | PRN
  Administered 2024-01-02: 1000 mL

## 2024-01-02 MED ORDER — PROPOFOL 10 MG/ML IV BOLUS
INTRAVENOUS | Status: AC
  Filled 2024-01-02: qty 20

## 2024-01-02 MED ORDER — KETOROLAC TROMETHAMINE 30 MG/ML IJ SOLN
INTRAMUSCULAR | Status: AC
  Filled 2024-01-02: qty 1

## 2024-01-02 MED ORDER — OXYCODONE HCL 5 MG/5ML PO SOLN: 5.0000 mg | Freq: Once | ORAL | Status: DC | PRN

## 2024-01-02 MED ORDER — LIDOCAINE-EPINEPHRINE 2 %-1:100000 IJ SOLN
INTRAMUSCULAR | Status: DC | PRN
  Administered 2024-01-02: 17 mL via INTRADERMAL

## 2024-01-02 MED ORDER — SUGAMMADEX SODIUM 200 MG/2ML IV SOLN
INTRAVENOUS | Status: AC
Start: 2024-01-02 — End: ?
  Filled 2024-01-02: qty 2

## 2024-01-02 MED ORDER — SODIUM CHLORIDE 0.9 % IV SOLN: 12.5000 mg | INTRAVENOUS | Status: DC | PRN

## 2024-01-02 SURGICAL SUPPLY — 26 items
BAG COUNTER SPONGE SURGICOUNT (BAG) IMPLANT
BLADE SURG 15 STRL LF DISP TIS (BLADE) ×1 IMPLANT
BUR CROSS CUT FISSURE 1.6 (BURR) ×1 IMPLANT
BUR EGG ELITE 4.0 (BURR) ×1 IMPLANT
CANISTER SUCT 3000ML PPV (MISCELLANEOUS) ×1 IMPLANT
COVER SURGICAL LIGHT HANDLE (MISCELLANEOUS) ×1 IMPLANT
GAUZE PACKING FOLDED 2 STR (GAUZE/BANDAGES/DRESSINGS) ×1 IMPLANT
GLOVE BIO SURGEON STRL SZ8 (GLOVE) ×1 IMPLANT
GOWN STRL REUS W/ TWL LRG LVL3 (GOWN DISPOSABLE) ×1 IMPLANT
GOWN STRL REUS W/ TWL XL LVL3 (GOWN DISPOSABLE) ×1 IMPLANT
IV NS 1000ML BAXH (IV SOLUTION) ×1 IMPLANT
KIT BASIN OR (CUSTOM PROCEDURE TRAY) ×1 IMPLANT
KIT TURNOVER KIT B (KITS) ×1 IMPLANT
NDL HYPO 25GX1X1/2 BEV (NEEDLE) ×2 IMPLANT
NEEDLE HYPO 25GX1X1/2 BEV (NEEDLE) ×2 IMPLANT
NS IRRIG 1000ML POUR BTL (IV SOLUTION) ×1 IMPLANT
PAD ARMBOARD POSITIONER FOAM (MISCELLANEOUS) ×1 IMPLANT
SLEEVE IRRIGATION ELITE 7 (MISCELLANEOUS) ×1 IMPLANT
SPIKE FLUID TRANSFER (MISCELLANEOUS) ×1 IMPLANT
SPONGE SURGIFOAM ABS GEL 12-7 (HEMOSTASIS) IMPLANT
SUT PLAIN 3 0 PS2 27 (SUTURE) ×1 IMPLANT
SYR BULB IRRIG 60ML STRL (SYRINGE) ×1 IMPLANT
SYR CONTROL 10ML LL (SYRINGE) ×1 IMPLANT
TRAY ENT MC OR (CUSTOM PROCEDURE TRAY) ×1 IMPLANT
TUBING IRRIGATION (MISCELLANEOUS) ×1 IMPLANT
YANKAUER SUCT BULB TIP NO VENT (SUCTIONS) ×1 IMPLANT

## 2024-01-02 NOTE — Interval H&P Note (Signed)
 Anesthesia H&P Update: History and Physical Exam reviewed; patient is OK for planned anesthetic and procedure. ? ?

## 2024-01-02 NOTE — Op Note (Signed)
 NAME: Lance Cohen, Lance Cohen MEDICAL RECORD NO: [id number] ACCOUNT NO: [id number] DATE OF BIRTH: Jun 30, 1971 FACILITY: [facility] LOCATION: [facility] PHYSICIAN: Georgia Lopes, DDS  Operative Report   DATE OF PROCEDURE: 01/02/2024  PREOPERATIVE DIAGNOSES: Nonrestorable teeth #3, 4, 5, 6, 10, 11, 12, 13, 14, and 31 secondary to dental caries.  POSTOPERATIVE DIAGNOSES: Nonrestorable teeth #3, 4, 5, 6, 10, 11, 12, 13, 14, and 31 secondary to dental caries.  PROCEDURES PERFORMED: 1.  Extraction of teeth #3, 4, 5, 6, 10, 11, 12, 13, 14, and 31. 2.  Alveoloplasty, left maxilla.  SURGEON: Georgia Lopes, DDS  ANESTHESIA: General nasal intubation, Dr. Mal Amabile, attending.  DESCRIPTION OF PROCEDURE: The patient was taken to the operating room and placed on the table in supine position.  General anesthesia was administered.  Nasal endotracheal tube was placed and secured.  The eyes were protected.  The patient was draped for surgery.  Timeout was  performed.  The posterior pharynx was suctioned and a throat pack was placed.  2% lidocaine 1:100,000 epinephrine was infiltrated in the maxilla buccally and lingually around the teeth to be removed and an inferior alveolar block was administered on the  right side using local anesthesia.  A bite block was placed on the right side of the mouth.  The left side was operated.  A #15 blade was used to make an incision around teeth #10, 11, 12, 13, and 14 in both the buccal and palatal sulcus.  The periosteum  was reflected.  The teeth could not be elevated as there were multiple residual roots.  The Stryker handpiece was used for the fissure bur under irrigation to remove interproximal bone and to separate tooth #14.  Then, the 301 elevator was used to  elevate the roots and the teeth and they were removed with the rongeurs and #150 forceps.  The sockets were then curetted and debrided.  The periosteum was reflected to expose the alveolar crest, which had buccal  undercuts.  Alveoloplasty was performed  using the egg burr and the Stryker handpiece under irrigation followed by the bone filed and the area was irrigated and closed with 3-0 chromic.  Attention was then turned to the right maxilla and the right mandible.  Tooth #31 was removed using a #15  blade to make a sulcular incision.  The tooth was elevated and removed with the dental forceps.  The socket was curetted and irrigated.  No closure was necessary.  A #15 blade was used to make an incision around teeth #3, 4, 5, and 6.  The periosteum was  reflected.  Bone was removed between teeth #3, #4, #5, and #6.  Tooth #3 was sectioned as the roots were still adjoined.  Then, the teeth were elevated and removed with the dental forceps.  The sockets were curetted and irrigated.  The area was closed  with 3-0 chromic.  Then, the oral cavity was irrigated and suctioned.  The throat pack was removed.  The patient was left under the care of anesthesia for extubation, transported to the recovery room, with plans for discharge home through day surgery.  ESTIMATED BLOOD LOSS: Minimum.  COMPLICATIONS: None.  SPECIMENS: None.   MUK D: 01/02/2024 10:50:05 am T: 01/02/2024 11:17:00 am  JOB: [id number]/ [id number]

## 2024-01-02 NOTE — Anesthesia Procedure Notes (Signed)
 Procedure Name: Intubation Date/Time: 01/02/2024 10:08 AM  Performed by: Susy Manor, CRNAPre-anesthesia Checklist: Patient identified, Emergency Drugs available, Suction available and Patient being monitored Patient Re-evaluated:Patient Re-evaluated prior to induction Oxygen Delivery Method: Circle System Utilized Preoxygenation: Pre-oxygenation with 100% oxygen Induction Type: IV induction Ventilation: Mask ventilation without difficulty Laryngoscope Size: Glidescope and 4 Tube type: Oral Nasal Tubes: Nasal Rae and Nasal prep performed Tube size: 7.0 mm Number of attempts: 1 Airway Equipment and Method: Stylet and Oral airway Placement Confirmation: ETT inserted through vocal cords under direct vision, positive ETCO2 and breath sounds checked- equal and bilateral Secured at: 28 cm Tube secured with: Tape Dental Injury: Teeth and Oropharynx as per pre-operative assessment

## 2024-01-02 NOTE — Anesthesia Preprocedure Evaluation (Addendum)
 Anesthesia Evaluation  Patient identified by MRN, date of birth, ID band Patient awake    Reviewed: Allergy & Precautions, NPO status , Patient's Chart, lab work & pertinent test results  History of Anesthesia Complications Negative for: history of anesthetic complications  Airway Mallampati: I  TM Distance: >3 FB Neck ROM: Full    Dental  (+) Dental Advisory Given, Loose, Missing   Pulmonary asthma , COPD,  COPD inhaler, Current Smoker and Patient abstained from smoking.   Pulmonary exam normal        Cardiovascular hypertension (stopped meds >1 yr ago), + DVT  Normal cardiovascular exam   '22 TTE - EF 55%. Trivial mitral valve regurgitation. Aortic dilatation noted. There is borderline dilatation of the aortic root, measuring 37 mm.     Neuro/Psych Seizures -, Well Controlled,  PSYCHIATRIC DISORDERS Anxiety Depression Bipolar Disorder    Hx suicide attempt by depakote OD    GI/Hepatic ,GERD  Controlled,,(+)     substance abuse  cocaine use and marijuana use  Endo/Other  negative endocrine ROS    Renal/GU negative Renal ROS     Musculoskeletal  (+) Arthritis ,    Abdominal   Peds  Hematology  On eliquis    Anesthesia Other Findings   Reproductive/Obstetrics                             Anesthesia Physical Anesthesia Plan  ASA: 3  Anesthesia Plan: General   Post-op Pain Management: Tylenol PO (pre-op)* and Minimal or no pain anticipated   Induction: Intravenous  PONV Risk Score and Plan: 1 and Treatment may vary due to age or medical condition, Ondansetron, Dexamethasone and Midazolam  Airway Management Planned: Nasal ETT  Additional Equipment: None  Intra-op Plan:   Post-operative Plan: Extubation in OR  Informed Consent: I have reviewed the patients History and Physical, chart, labs and discussed the procedure including the risks, benefits and alternatives for the  proposed anesthesia with the patient or authorized representative who has indicated his/her understanding and acceptance.     Dental advisory given  Plan Discussed with: CRNA and Anesthesiologist  Anesthesia Plan Comments:         Anesthesia Quick Evaluation

## 2024-01-02 NOTE — Anesthesia Postprocedure Evaluation (Signed)
 Anesthesia Post Note  Patient: SAMIE REASONS  Procedure(s) Performed: EXTRACTION TEETH NUMBER THREE, FOUR, FIVE, SIX, TEEN, ELEVEN, TWELVE, THIRTEEN, FOURTEEN, THIRTY ONE, ALVEOPLASTY (Mouth)     Patient location during evaluation: PACU Anesthesia Type: General Level of consciousness: awake and alert Pain management: pain level controlled Vital Signs Assessment: post-procedure vital signs reviewed and stable Respiratory status: spontaneous breathing, nonlabored ventilation and respiratory function stable Cardiovascular status: stable and blood pressure returned to baseline Anesthetic complications: no   No notable events documented.  Last Vitals:  Vitals:   01/02/24 1115 01/02/24 1130  BP: (!) 135/91 (!) 158/88  Pulse: 63 76  Resp: 15 20  Temp:  36.5 C  SpO2: 94% 93%    Last Pain:  Vitals:   01/02/24 1130  TempSrc:   PainSc: 3                  Beryle Lathe

## 2024-01-02 NOTE — H&P (Signed)
 H&P documentation  -History and Physical Reviewed  -Patient has been re-examined  -No change in the plan of care  Lance Cohen

## 2024-01-02 NOTE — Op Note (Signed)
 01/02/2024  10:45 AM  PATIENT:  Cleon Dew  53 y.o. male  PRE-OPERATIVE DIAGNOSIS:  NON-RESTORABLE TEETH NUMBER THREE, FOUR, FIVE, SIX, TEN, ELEVEN, TWELVE, THIRTEEN, FOURTEEN, THIRTY ONE SECONDARY TO DENTAL CARIES  POST-OPERATIVE DIAGNOSIS:  SAME  PROCEDURE:  Procedure(s): EXTRACTION TEETH NUMBER THREE, FOUR, FIVE, SIX, TEN, ELEVEN, TWELVE, THIRTEEN, FOURTEEN, THIRTY ONE, ALVEOLOPLASTY LEFT MAXILLA  SURGEON:  Surgeon(s): Ocie Doyne, DMD  ANESTHESIA:   local and general  EBL:  minimal  DRAINS: none   SPECIMEN:  No Specimen  COUNTS:  YES  PLAN OF CARE: Discharge to home after PACU  PATIENT DISPOSITION:  PACU - hemodynamically stable.   PROCEDURE DETAILS: Dictation #[id number]  Georgia Lopes, DMD 01/02/2024 10:45 AM

## 2024-01-02 NOTE — Transfer of Care (Signed)
 Immediate Anesthesia Transfer of Care Note  Patient: Lance Cohen  Procedure(s) Performed: EXTRACTION TEETH NUMBER THREE, FOUR, FIVE, SIX, TEEN, ELEVEN, TWELVE, THIRTEEN, FOURTEEN, THIRTY ONE, ALVEOPLASTY (Mouth)  Patient Location: PACU  Anesthesia Type:General  Level of Consciousness: sedated  Airway & Oxygen Therapy: Patient Spontanous Breathing  Post-op Assessment: Report given to RN and Post -op Vital signs reviewed and stable  Post vital signs: Reviewed and stable  Last Vitals:  Vitals Value Taken Time  BP 158/87 01/02/24 1102  Temp 36.5 C 01/02/24 1059  Pulse 63 01/02/24 1104  Resp 16 01/02/24 1104  SpO2 99 % 01/02/24 1104  Vitals shown include unfiled device data.  Last Pain:  Vitals:   01/02/24 1100  TempSrc:   PainSc: Asleep         Complications: No notable events documented.

## 2024-01-03 ENCOUNTER — Encounter (HOSPITAL_COMMUNITY): Payer: Self-pay | Admitting: Oral Surgery

## 2024-01-03 DIAGNOSIS — M51372 Other intervertebral disc degeneration, lumbosacral region with discogenic back pain and lower extremity pain: Secondary | ICD-10-CM | POA: Diagnosis not present

## 2024-01-04 DIAGNOSIS — M51372 Other intervertebral disc degeneration, lumbosacral region with discogenic back pain and lower extremity pain: Secondary | ICD-10-CM | POA: Diagnosis not present

## 2024-01-05 DIAGNOSIS — M51372 Other intervertebral disc degeneration, lumbosacral region with discogenic back pain and lower extremity pain: Secondary | ICD-10-CM | POA: Diagnosis not present

## 2024-01-06 DIAGNOSIS — M51372 Other intervertebral disc degeneration, lumbosacral region with discogenic back pain and lower extremity pain: Secondary | ICD-10-CM | POA: Diagnosis not present

## 2024-01-07 DIAGNOSIS — M51372 Other intervertebral disc degeneration, lumbosacral region with discogenic back pain and lower extremity pain: Secondary | ICD-10-CM | POA: Diagnosis not present

## 2024-01-08 DIAGNOSIS — M51372 Other intervertebral disc degeneration, lumbosacral region with discogenic back pain and lower extremity pain: Secondary | ICD-10-CM | POA: Diagnosis not present

## 2024-01-09 DIAGNOSIS — M51372 Other intervertebral disc degeneration, lumbosacral region with discogenic back pain and lower extremity pain: Secondary | ICD-10-CM | POA: Diagnosis not present

## 2024-01-10 DIAGNOSIS — M51372 Other intervertebral disc degeneration, lumbosacral region with discogenic back pain and lower extremity pain: Secondary | ICD-10-CM | POA: Diagnosis not present

## 2024-01-11 DIAGNOSIS — M51372 Other intervertebral disc degeneration, lumbosacral region with discogenic back pain and lower extremity pain: Secondary | ICD-10-CM | POA: Diagnosis not present

## 2024-01-12 ENCOUNTER — Ambulatory Visit: Payer: Self-pay | Admitting: Internal Medicine

## 2024-01-12 DIAGNOSIS — M51372 Other intervertebral disc degeneration, lumbosacral region with discogenic back pain and lower extremity pain: Secondary | ICD-10-CM | POA: Diagnosis not present

## 2024-01-13 DIAGNOSIS — M51372 Other intervertebral disc degeneration, lumbosacral region with discogenic back pain and lower extremity pain: Secondary | ICD-10-CM | POA: Diagnosis not present

## 2024-01-13 NOTE — Patient Instructions (Signed)
 Lance Cohen  01/13/2024     @PREFPERIOPPHARMACY @   Your procedure is scheduled on 01/19/2024.   Report to [facility] at  0815  A.M.   Call this number if you have problems the morning of surgery:  [phone]  If you experience any cold or flu symptoms such as cough, fever, chills, shortness of breath, etc. between now and your scheduled surgery, please notify us at the above number.   Remember:        Your last dose of eliquis should be on 01/16/2024.        Use your inhaler before you come and bring your rescue inhaler with you.    Follow the diet and prep instructions given to you by the office.   You may drink clear liquids until  0615 am on 01/19/2024.    Clear liquids allowed are:                    Water, Juice (No red color; non-citric and without pulp; diabetics please choose diet or no sugar options), Carbonated beverages (diabetics please choose diet or no sugar options), Clear Tea (No creamer, milk, or cream, including half & half and powdered creamer), Black Coffee Only (No creamer, milk or cream, including half & half and powdered creamer), and Clear Sports drink (No red color; diabetics please choose diet or no sugar options)    Take these medicines the morning of surgery with A SIP OF WATER                 bupropion, gabapentin, methocarbamol (if needed).     Do not wear jewelry, make-up or nail polish, including gel polish,  artificial nails, or any other type of covering on natural nails (fingers and  toes).  Do not wear lotions, powders, or perfumes, or deodorant.  Do not shave 48 hours prior to surgery.  Men may shave face and neck.  Do not bring valuables to the hospital.  [facility] is not responsible for any belongings or valuables.  Contacts, dentures or bridgework may not be worn into surgery.  Leave your suitcase in the car.  After surgery it may be brought to your room.  For patients admitted to the hospital, discharge time will be  determined by your treatment team.  Patients discharged the day of surgery will not be allowed to drive home and must have someone with them for 24 hours.     Special instructions:   DO NOT smoke tobacco or vape for 24 hours before your procedure.  Please read over the following fact sheets that you were given. Anesthesia Post-op Instructions and Care and Recovery After Surgery      Colonoscopy, Adult, Care After The following information offers guidance on how to care for yourself after your procedure. Your health care provider may also give you more specific instructions. If you have problems or questions, contact your health care provider. What can I expect after the procedure? After the procedure, it is common to have: A small amount of blood in your stool for 24 hours after the procedure. Some gas. Mild cramping or bloating of your abdomen. Follow these instructions at home: Eating and drinking  Drink enough fluid to keep your urine pale yellow. Follow instructions from your health care provider about eating or drinking restrictions. Resume your normal diet as told by your health care provider. Avoid heavy or fried foods that are hard to digest.  Activity Rest as told by your health care provider. Avoid sitting for a long time without moving. Get up to take short walks every 1-2 hours. This is important to improve blood flow and breathing. Ask for help if you feel weak or unsteady. Return to your normal activities as told by your health care provider. Ask your health care provider what activities are safe for you. Managing cramping and bloating  Try walking around when you have cramps or feel bloated. If directed, apply heat to your abdomen as told by your health care provider. Use the heat source that your health care provider recommends, such as a moist heat pack or a heating pad. Place a towel between your skin and the heat source. Leave the heat on for 20-30  minutes. Remove the heat if your skin turns bright red. This is especially important if you are unable to feel pain, heat, or cold. You have a greater risk of getting burned. General instructions If you were given a sedative during the procedure, it can affect you for several hours. Do not drive or operate machinery until your health care provider says that it is safe. For the first 24 hours after the procedure: Do not sign important documents. Do not drink alcohol. Do your regular daily activities at a slower pace than normal. Eat soft foods that are easy to digest. Take over-the-counter and prescription medicines only as told by your health care provider. Keep all follow-up visits. This is important. Contact a health care provider if: You have blood in your stool 2-3 days after the procedure. Get help right away if: You have more than a small spotting of blood in your stool. You have large blood clots in your stool. You have swelling of your abdomen. You have nausea or vomiting. You have a fever. You have increasing pain in your abdomen that is not relieved with medicine. These symptoms may be an emergency. Get help right away. Call 911. Do not wait to see if the symptoms will go away. Do not drive yourself to the hospital. Summary After the procedure, it is common to have a small amount of blood in your stool. You may also have mild cramping and bloating of your abdomen. If you were given a sedative during the procedure, it can affect you for several hours. Do not drive or operate machinery until your health care provider says that it is safe. Get help right away if you have a lot of blood in your stool, nausea or vomiting, a fever, or increased pain in your abdomen. This information is not intended to replace advice given to you by your health care provider. Make sure you discuss any questions you have with your health care provider. Document Revised: 11/19/2022 Document Reviewed:  05/30/2021 Elsevier Patient Education  2024 [employer]General Anesthesia, Adult, Care After The following information offers guidance on how to care for yourself after your procedure. Your health care provider may also give you more specific instructions. If you have problems or questions, contact your health care provider. What can I expect after the procedure? After the procedure, it is common for people to: Have pain or discomfort at the IV site. Have nausea or vomiting. Have a sore throat or hoarseness. Have trouble concentrating. Feel cold or chills. Feel weak, sleepy, or tired (fatigue). Have soreness and body aches. These can affect parts of the body that were not involved in surgery. Follow these instructions at home: For the time period you were told  by your health care provider:  Rest. Do not participate in activities where you could fall or become injured. Do not drive or use machinery. Do not drink alcohol. Do not take sleeping pills or medicines that cause drowsiness. Do not make important decisions or sign legal documents. Do not take care of children on your own. General instructions Drink enough fluid to keep your urine pale yellow. If you have sleep apnea, surgery and certain medicines can increase your risk for breathing problems. Follow instructions from your health care provider about wearing your sleep device: Anytime you are sleeping, including during daytime naps. While taking prescription pain medicines, sleeping medicines, or medicines that make you drowsy. Return to your normal activities as told by your health care provider. Ask your health care provider what activities are safe for you. Take over-the-counter and prescription medicines only as told by your health care provider. Do not use any products that contain nicotine or tobacco. These products include cigarettes, chewing tobacco, and vaping devices, such as e-cigarettes. These can delay incision  healing after surgery. If you need help quitting, ask your health care provider. Contact a health care provider if: You have nausea or vomiting that does not get better with medicine. You vomit every time you eat or drink. You have pain that does not get better with medicine. You cannot urinate or have bloody urine. You develop a skin rash. You have a fever. Get help right away if: You have trouble breathing. You have chest pain. You vomit blood. These symptoms may be an emergency. Get help right away. Call 911. Do not wait to see if the symptoms will go away. Do not drive yourself to the hospital. Summary After the procedure, it is common to have a sore throat, hoarseness, nausea, vomiting, or to feel weak, sleepy, or fatigue. For the time period you were told by your health care provider, do not drive or use machinery. Get help right away if you have difficulty breathing, have chest pain, or vomit blood. These symptoms may be an emergency. This information is not intended to replace advice given to you by your health care provider. Make sure you discuss any questions you have with your health care provider. Document Revised: 01/04/2022 Document Reviewed: 01/04/2022 Elsevier Patient Education  2024 [employer].

## 2024-01-14 ENCOUNTER — Ambulatory Visit: Payer: Medicaid Other | Admitting: Orthopedic Surgery

## 2024-01-14 DIAGNOSIS — M51372 Other intervertebral disc degeneration, lumbosacral region with discogenic back pain and lower extremity pain: Secondary | ICD-10-CM | POA: Diagnosis not present

## 2024-01-15 ENCOUNTER — Encounter (HOSPITAL_COMMUNITY)
Admission: RE | Admit: 2024-01-15 | Discharge: 2024-01-15 | Disposition: A | Discharge status: Home / Self Care | Source: Ambulatory Visit | Attending: Internal Medicine | Admitting: Internal Medicine

## 2024-01-15 DIAGNOSIS — F1410 Cocaine abuse, uncomplicated: Secondary | ICD-10-CM

## 2024-01-15 DIAGNOSIS — M51372 Other intervertebral disc degeneration, lumbosacral region with discogenic back pain and lower extremity pain: Secondary | ICD-10-CM | POA: Diagnosis not present

## 2024-01-16 ENCOUNTER — Encounter (HOSPITAL_COMMUNITY)
Admission: RE | Admit: 2024-01-16 | Discharge: 2024-01-16 | Disposition: A | Discharge status: Home / Self Care | Source: Ambulatory Visit | Attending: Internal Medicine | Admitting: Internal Medicine

## 2024-01-16 ENCOUNTER — Encounter (HOSPITAL_COMMUNITY): Payer: Self-pay

## 2024-01-16 DIAGNOSIS — M51372 Other intervertebral disc degeneration, lumbosacral region with discogenic back pain and lower extremity pain: Secondary | ICD-10-CM | POA: Diagnosis not present

## 2024-01-17 DIAGNOSIS — M51372 Other intervertebral disc degeneration, lumbosacral region with discogenic back pain and lower extremity pain: Secondary | ICD-10-CM | POA: Diagnosis not present

## 2024-01-18 DIAGNOSIS — M51372 Other intervertebral disc degeneration, lumbosacral region with discogenic back pain and lower extremity pain: Secondary | ICD-10-CM | POA: Diagnosis not present

## 2024-01-19 ENCOUNTER — Ambulatory Visit (HOSPITAL_COMMUNITY): Admission: RE | Admit: 2024-01-19 | Payer: Medicaid Other | Source: Home / Self Care | Admitting: Internal Medicine

## 2024-01-19 ENCOUNTER — Encounter (HOSPITAL_COMMUNITY): Admission: RE | Payer: Self-pay | Source: Home / Self Care

## 2024-01-19 DIAGNOSIS — M51372 Other intervertebral disc degeneration, lumbosacral region with discogenic back pain and lower extremity pain: Secondary | ICD-10-CM | POA: Diagnosis not present

## 2024-01-19 SURGERY — COLONOSCOPY WITH PROPOFOL
Anesthesia: Choice

## 2024-01-19 NOTE — OR Nursing (Signed)
 No show for procedure. Procedure cancelled

## 2024-01-20 DIAGNOSIS — M51372 Other intervertebral disc degeneration, lumbosacral region with discogenic back pain and lower extremity pain: Secondary | ICD-10-CM | POA: Diagnosis not present

## 2024-01-21 DIAGNOSIS — M51372 Other intervertebral disc degeneration, lumbosacral region with discogenic back pain and lower extremity pain: Secondary | ICD-10-CM | POA: Diagnosis not present

## 2024-01-22 DIAGNOSIS — M51372 Other intervertebral disc degeneration, lumbosacral region with discogenic back pain and lower extremity pain: Secondary | ICD-10-CM | POA: Diagnosis not present

## 2024-01-23 DIAGNOSIS — M51372 Other intervertebral disc degeneration, lumbosacral region with discogenic back pain and lower extremity pain: Secondary | ICD-10-CM | POA: Diagnosis not present

## 2024-01-24 DIAGNOSIS — M51372 Other intervertebral disc degeneration, lumbosacral region with discogenic back pain and lower extremity pain: Secondary | ICD-10-CM | POA: Diagnosis not present

## 2024-01-25 DIAGNOSIS — M51372 Other intervertebral disc degeneration, lumbosacral region with discogenic back pain and lower extremity pain: Secondary | ICD-10-CM | POA: Diagnosis not present

## 2024-01-26 DIAGNOSIS — M51372 Other intervertebral disc degeneration, lumbosacral region with discogenic back pain and lower extremity pain: Secondary | ICD-10-CM | POA: Diagnosis not present

## 2024-01-27 DIAGNOSIS — M51372 Other intervertebral disc degeneration, lumbosacral region with discogenic back pain and lower extremity pain: Secondary | ICD-10-CM | POA: Diagnosis not present

## 2024-01-28 ENCOUNTER — Other Ambulatory Visit (INDEPENDENT_AMBULATORY_CARE_PROVIDER_SITE_OTHER): Payer: Self-pay

## 2024-01-28 ENCOUNTER — Ambulatory Visit: Admitting: Orthopedic Surgery

## 2024-01-28 VITALS — BP 135/81 | HR 73 | Ht 70.0 in | Wt 180.0 lb

## 2024-01-28 DIAGNOSIS — Z7689 Persons encountering health services in other specified circumstances: Secondary | ICD-10-CM | POA: Diagnosis not present

## 2024-01-28 DIAGNOSIS — M542 Cervicalgia: Secondary | ICD-10-CM | POA: Diagnosis not present

## 2024-01-28 DIAGNOSIS — M5412 Radiculopathy, cervical region: Secondary | ICD-10-CM | POA: Diagnosis not present

## 2024-01-28 DIAGNOSIS — M4802 Spinal stenosis, cervical region: Secondary | ICD-10-CM | POA: Diagnosis not present

## 2024-01-28 DIAGNOSIS — Z720 Tobacco use: Secondary | ICD-10-CM

## 2024-01-28 DIAGNOSIS — M51372 Other intervertebral disc degeneration, lumbosacral region with discogenic back pain and lower extremity pain: Secondary | ICD-10-CM | POA: Diagnosis not present

## 2024-01-28 NOTE — Progress Notes (Addendum)
 Orthopedic Spine Surgery Office Note  Assessment: Patient is a 53 y.o. male with neck pain that radiates into right shoulder and arm to the level of the elbow. 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 Discussed that the conservative treatments include:  -activity modification  -physical therapy  -over the counter pain medications  -medrol dosepak  -cervical steroid injections -Patient has tried tylenol, aleve, gabapentin, methocarbamol -Patient was started on gabapentin and methocarbamol by Dr. Phyllis Breeze as conservative treatment. This treatment was started on 11/13/2023. It has not provided any relief with over 2 months (8 weeks) of this treatment, so recommended diagnostic/therapeutic cervical ESI -Would need to be nicotine free prior to any elective spine surgery -Patient should return to office in 6 weeks, x-rays at next visit: none   Spent 5 minutes going over the risks associated with nicotine use.  Explained that it affects more than just the lungs.  I also told him that from a surgical perspective, increases his risk for complication particularly infection and pseudoarthrosis.  After conversation, patient decided that he was going to work on quitting.  ___________________________________________________________________________   History:  Patient is a 53 y.o. male who presents today for cervical spine.  Patient has had several years of neck pain that has gotten progressively worse with time.  In the last couple of years, his pain has started to radiate into the right upper extremity.  He feels it going into the shoulder and lateral aspect of the arm to the level of the elbow.  He does not have any pain radiating into the left upper extremity.  There is no trauma or injury that preceded the onset of pain.  He  feels the pain with activity and at rest.   Weakness: Yes, feels his right arm is weaker. Denies other weakness Difficulty with fine motor skills (e.g., buttoning shirts, handwriting): denies  Symptoms of imbalance: denies Paresthesias and numbness: denies  Bowel or bladder incontinence: denies Saddle anesthesia: denies   Treatments tried: tylenol, aleve, gabapentin, methocarbamol  Review of systems: Denies fevers and chills, night sweats, unexplained weight loss, history of cancer. Has had pain that wakes him at night  Past medical history: Bipolar disorder COPD HTN Chronic pain OSA  Allergies: lovenox, penicillin, zofran, sulfa  Past surgical history:  R radius fracture ORIF Peripheral vascular catheterization Polypectomy  Social history: Reports use of nicotine product (smoking, vaping, patches, smokeless) Alcohol use: Yes, approximately 10 drinks per week Denies recreational drug use   Physical Exam:  BMI of 25.8  General: no acute distress, appears stated age Neurologic: alert, answering questions appropriately, following commands Respiratory: unlabored breathing on room air, symmetric chest rise Psychiatric: appropriate affect, normal cadence to speech   MSK (spine):  -Strength exam      Left  Right Grip strength                5/5  5/5 Interosseus   5/5   5/5 Wrist extension  5/5  5/5 Wrist flexion   5/5  5/5 Elbow flexion   5/5  5/5 Deltoid    5/5  5/5  EHL    5/5  5/5 TA    5/5  5/5 GSC    5/5  5/5 Knee extension  5/5  5/5 Hip flexion   5/5  5/5  -Sensory exam    Sensation intact to  light touch in L2-S1 nerve distributions of bilateral lower extremities  Sensation intact to light touch in C4-T1 nerve distributions of bilateral upper extremities  -Brachioradialis DTR: 2/4 on the left, 2/4 on the right -Biceps DTR: 2/4 on the left, 2/4 on the right -Triceps DTR: 2/4 on the left, 2/4 on the right -Achilles DTR: 2/4 on the left, 2/4 on the  right -Patellar tendon DTR: 2/4 on the left, 2/4 on the right  -Spurling: negative bilaterally -Hoffman sign: negative bilaterally -Clonus: no beats bilaterally -Interosseous wasting: none seen -Grip and release test: negative  -Romberg: negative  -Gait: normal  Left shoulder exam: no pain through range of motion Right shoulder exam: No pain through range of motion, pain but no weakness with Jobe, negative belly press, no weakness with external rotation with arm at side  Imaging: XRs of the cervical spine from 01/28/2024 were independently reviewed and interpreted, showing no significant degenerative changes.  No fracture or dislocation seen.  No evidence of instability on flexion/extension views.  MRI of the cervical spine from 12/05/2023 was independently reviewed and interpreted, showing right-sided foraminal stenosis at C3/4 and C4/5. No other significant stenosis seen. No T2 cord signal change.    Patient name: Lance Cohen Patient MRN: [id number] Date of visit: 01/28/24

## 2024-01-29 DIAGNOSIS — M51372 Other intervertebral disc degeneration, lumbosacral region with discogenic back pain and lower extremity pain: Secondary | ICD-10-CM | POA: Diagnosis not present

## 2024-01-30 DIAGNOSIS — M51372 Other intervertebral disc degeneration, lumbosacral region with discogenic back pain and lower extremity pain: Secondary | ICD-10-CM | POA: Diagnosis not present

## 2024-01-31 DIAGNOSIS — Z419 Encounter for procedure for purposes other than remedying health state, unspecified: Secondary | ICD-10-CM | POA: Diagnosis not present

## 2024-01-31 DIAGNOSIS — M51372 Other intervertebral disc degeneration, lumbosacral region with discogenic back pain and lower extremity pain: Secondary | ICD-10-CM | POA: Diagnosis not present

## 2024-02-01 DIAGNOSIS — M51372 Other intervertebral disc degeneration, lumbosacral region with discogenic back pain and lower extremity pain: Secondary | ICD-10-CM | POA: Diagnosis not present

## 2024-02-02 DIAGNOSIS — M51372 Other intervertebral disc degeneration, lumbosacral region with discogenic back pain and lower extremity pain: Secondary | ICD-10-CM | POA: Diagnosis not present

## 2024-02-03 DIAGNOSIS — M51372 Other intervertebral disc degeneration, lumbosacral region with discogenic back pain and lower extremity pain: Secondary | ICD-10-CM | POA: Diagnosis not present

## 2024-02-04 DIAGNOSIS — M51372 Other intervertebral disc degeneration, lumbosacral region with discogenic back pain and lower extremity pain: Secondary | ICD-10-CM | POA: Diagnosis not present

## 2024-02-05 DIAGNOSIS — M51372 Other intervertebral disc degeneration, lumbosacral region with discogenic back pain and lower extremity pain: Secondary | ICD-10-CM | POA: Diagnosis not present

## 2024-02-06 DIAGNOSIS — M51372 Other intervertebral disc degeneration, lumbosacral region with discogenic back pain and lower extremity pain: Secondary | ICD-10-CM | POA: Diagnosis not present

## 2024-02-07 DIAGNOSIS — M51372 Other intervertebral disc degeneration, lumbosacral region with discogenic back pain and lower extremity pain: Secondary | ICD-10-CM | POA: Diagnosis not present

## 2024-02-08 DIAGNOSIS — M51372 Other intervertebral disc degeneration, lumbosacral region with discogenic back pain and lower extremity pain: Secondary | ICD-10-CM | POA: Diagnosis not present

## 2024-02-09 DIAGNOSIS — M51372 Other intervertebral disc degeneration, lumbosacral region with discogenic back pain and lower extremity pain: Secondary | ICD-10-CM | POA: Diagnosis not present

## 2024-02-10 DIAGNOSIS — M51372 Other intervertebral disc degeneration, lumbosacral region with discogenic back pain and lower extremity pain: Secondary | ICD-10-CM | POA: Diagnosis not present

## 2024-02-11 ENCOUNTER — Other Ambulatory Visit: Payer: Self-pay | Admitting: Orthopedic Surgery

## 2024-02-11 DIAGNOSIS — M5412 Radiculopathy, cervical region: Secondary | ICD-10-CM

## 2024-02-11 DIAGNOSIS — M51372 Other intervertebral disc degeneration, lumbosacral region with discogenic back pain and lower extremity pain: Secondary | ICD-10-CM | POA: Diagnosis not present

## 2024-02-12 DIAGNOSIS — M51372 Other intervertebral disc degeneration, lumbosacral region with discogenic back pain and lower extremity pain: Secondary | ICD-10-CM | POA: Diagnosis not present

## 2024-02-13 DIAGNOSIS — M51372 Other intervertebral disc degeneration, lumbosacral region with discogenic back pain and lower extremity pain: Secondary | ICD-10-CM | POA: Diagnosis not present

## 2024-02-14 DIAGNOSIS — M51372 Other intervertebral disc degeneration, lumbosacral region with discogenic back pain and lower extremity pain: Secondary | ICD-10-CM | POA: Diagnosis not present

## 2024-02-15 ENCOUNTER — Emergency Department (HOSPITAL_COMMUNITY)

## 2024-02-15 ENCOUNTER — Other Ambulatory Visit: Payer: Self-pay

## 2024-02-15 ENCOUNTER — Emergency Department (HOSPITAL_COMMUNITY)
Admission: EM | Admit: 2024-02-15 | Discharge: 2024-02-15 | Disposition: A | Discharge status: Home / Self Care | Attending: Emergency Medicine | Admitting: Emergency Medicine

## 2024-02-15 ENCOUNTER — Encounter (HOSPITAL_COMMUNITY): Payer: Self-pay | Admitting: *Deleted

## 2024-02-15 DIAGNOSIS — M79605 Pain in left leg: Secondary | ICD-10-CM | POA: Insufficient documentation

## 2024-02-15 DIAGNOSIS — Z86718 Personal history of other venous thrombosis and embolism: Secondary | ICD-10-CM | POA: Insufficient documentation

## 2024-02-15 DIAGNOSIS — M25562 Pain in left knee: Secondary | ICD-10-CM | POA: Diagnosis not present

## 2024-02-15 DIAGNOSIS — M51372 Other intervertebral disc degeneration, lumbosacral region with discogenic back pain and lower extremity pain: Secondary | ICD-10-CM | POA: Diagnosis not present

## 2024-02-15 DIAGNOSIS — Z7901 Long term (current) use of anticoagulants: Secondary | ICD-10-CM | POA: Diagnosis not present

## 2024-02-15 LAB — CBC WITH DIFFERENTIAL/PLATELET
Abs Immature Granulocytes: 0.01 10*3/uL (ref 0.00–0.07)
Basophils Absolute: 0.1 10*3/uL (ref 0.0–0.1)
Basophils Relative: 1 %
Eosinophils Absolute: 0.3 10*3/uL (ref 0.0–0.5)
Eosinophils Relative: 4 %
HCT: 42 % (ref 39.0–52.0)
Hemoglobin: 14.4 g/dL (ref 13.0–17.0)
Immature Granulocytes: 0 %
Lymphocytes Relative: 34 %
Lymphs Abs: 2.4 10*3/uL (ref 0.7–4.0)
MCH: 33 pg (ref 26.0–34.0)
MCHC: 34.3 g/dL (ref 30.0–36.0)
MCV: 96.1 fL (ref 80.0–100.0)
Monocytes Absolute: 0.7 10*3/uL (ref 0.1–1.0)
Monocytes Relative: 10 %
Neutro Abs: 3.6 10*3/uL (ref 1.7–7.7)
Neutrophils Relative %: 51 %
Platelets: 260 10*3/uL (ref 150–400)
RBC: 4.37 MIL/uL (ref 4.22–5.81)
RDW: 11.9 % (ref 11.5–15.5)
WBC: 7 10*3/uL (ref 4.0–10.5)
nRBC: 0 % (ref 0.0–0.2)

## 2024-02-15 LAB — BASIC METABOLIC PANEL WITH GFR
Anion gap: 8 (ref 5–15)
BUN: 19 mg/dL (ref 6–20)
CO2: 23 mmol/L (ref 22–32)
Calcium: 8.5 mg/dL — ABNORMAL LOW (ref 8.9–10.3)
Chloride: 104 mmol/L (ref 98–111)
Creatinine, Ser: 1 mg/dL (ref 0.61–1.24)
GFR, Estimated: >60 mL/min (ref >60)
Glucose, Bld: 112 mg/dL — ABNORMAL HIGH (ref 70–99)
Potassium: 3.8 mmol/L (ref 3.5–5.1)
Sodium: 135 mmol/L (ref 135–145)

## 2024-02-15 MED ORDER — METHOCARBAMOL 500 MG PO TABS: 500.0000 mg | ORAL_TABLET | Freq: Two times a day (BID) | ORAL | 0 refills | Status: AC

## 2024-02-15 MED ORDER — METHYLPREDNISOLONE 4 MG PO TBPK: ORAL_TABLET | ORAL | 0 refills | Status: DC

## 2024-02-15 MED ORDER — OXYCODONE HCL 5 MG PO TABS
5.0000 mg | ORAL_TABLET | ORAL | Status: AC
  Administered 2024-02-15: 5 mg via ORAL
  Filled 2024-02-15: qty 1

## 2024-02-15 MED ORDER — KETOROLAC TROMETHAMINE 15 MG/ML IJ SOLN
15.0000 mg | Freq: Once | INTRAMUSCULAR | Status: AC
  Administered 2024-02-15: 15 mg via INTRAMUSCULAR
  Filled 2024-02-15: qty 1

## 2024-02-15 NOTE — ED Triage Notes (Signed)
 Pt BIB [facility] for left leg pain, hx DVT to rt leg, reported that pt is on Eliquis . Pain going on 3 days.

## 2024-02-15 NOTE — ED Provider Notes (Signed)
 Lance Cohen Provider Note   CSN: [id number] Arrival date & time: 02/15/24  1109     History  Chief Complaint  Patient presents with   Leg Pain    Lance Cohen is a 53 y.o. male.  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  Patient reports that has been having pain in his left lower extremity from his proximal thigh down his leg for several days.  Says that it feels similar to the DVT that he had on his right leg.  Denies any chest pain or shortness of breath.  No new back pain but does have chronic back pain.  No bowel or bladder incontinence.  No leg weakness or numbness.  Reports being compliant with his Eliquis .  No history of IV drug use.  No fevers.       Home Medications Prior to Admission medications   Medication Sig Start Date End Date Taking? Authorizing Provider  methocarbamol  (ROBAXIN ) 500 MG tablet Take 1 tablet (500 mg total) by mouth 2 (two) times daily. 02/15/24  Yes Lance Basket, MD  methylPREDNISolone  (MEDROL  DOSEPAK) 4 MG TBPK tablet Take as directed on packaging 02/15/24  Yes Lance Basket, MD  apixaban  (ELIQUIS ) 5 MG TABS tablet Take 1 tablet (5 mg total) by mouth 2 (two) times daily. 12/15/23   Lance Sport, MD  buPROPion  (WELLBUTRIN  XL) 150 MG 24 hr tablet Take 1 tablet (150 mg total) by mouth daily. 12/15/23   Lance Sport, MD  clindamycin  (CLEOCIN ) 300 MG capsule Take 1 capsule (300 mg total) by mouth 3 (three) times daily. 01/02/24   Lance Cohen, DMD  EPINEPHrine  0.3 mg/0.3 mL IJ SOAJ injection Inject 0.3 mg into the muscle as needed for anaphylaxis. 06/07/23   [provider]  gabapentin  (NEURONTIN ) 300 MG capsule Take 1 capsule (300 mg total) by mouth 3 (three) times daily. 11/13/23   Lance Emerald, MD   methocarbamol  (ROBAXIN ) 750 MG tablet Take 1 tablet (750 mg total) by mouth 4 (four) times daily. 11/13/23   Lance Emerald, MD  QUEtiapine  (SEROQUEL ) 25 MG tablet Take 1 tablet (25 mg total) by mouth at bedtime. 12/15/23   Cohen, Lance K, MD  SYMBICORT 80-4.5 MCG/ACT inhaler Inhale 2 puffs into the lungs 2 (two) times daily. 05/26/23   [provider]      Allergies    Lovenox  [enoxaparin  sodium], Bee venom, Penicillins, Zofran  [ondansetron ], Other, and Sulfa  antibiotics    Review of Systems   Review of Systems  Physical Exam Updated Vital Signs BP 105/77   Pulse 80   Temp 98 F (36.7 C)   Resp 17   Ht 5\' 10"  (1.778 m)   Wt 83.9 kg   SpO2 95%   BMI 26.54 kg/m  Physical Exam Musculoskeletal:        General: No deformity.     Right lower leg: No edema.     Left lower leg: No edema.     Comments: DP pulses 2+ bilaterally.  Both feet appear warm well-perfused.  No obvious deformities of the leg.  No joint effusion of the left knee.  Full range of motion of the left knee.  Motor: Muscle bulk and tone are normal. Strength is 5/5 in hip flexion, knee flexion and extension, ankle dorsiflexion  and plantar flexion bilaterally. Full strength of great toe dorsiflexion bilaterally.  Sensory: Intact sensation to light touch in L2 though S1 dermatomes bilaterally.   Skin:    Comments: No rash or skin changes over the left thigh or left lower extremity.     ED Results / Procedures / Treatments   Labs (all labs ordered are listed, but only abnormal results are displayed) Labs Reviewed  BASIC METABOLIC PANEL WITH GFR - Abnormal; Notable for the following components:      Result Value   Glucose, Bld 112 (*)    Calcium  8.5 (*)    All other components within normal limits  CBC WITH DIFFERENTIAL/PLATELET    EKG None  Radiology DG Knee Complete 4 Views Left Result Date: 02/15/2024 CLINICAL DATA:  Left knee pain for 3 days without trauma EXAM: LEFT KNEE - COMPLETE 4+  VIEW COMPARISON:  07/16/2009 FINDINGS: No acute fracture or dislocation. No joint effusion. Minimal medial compartment joint space narrowing and subchondral sclerosis. IMPRESSION: No acute osseous abnormality. Electronically Signed   By: Lance Cohen M.D.   On: 02/15/2024 13:15    Procedures Procedures   EMERGENCY DEPARTMENT US  EXTREMITY EXAM "Study:  Limited Duplex of left lower Extremity Veins"  INDICATIONS: Leg pain Visualization of  regions in transverse plane with full compression visualized.   PERFORMED BY: Myself IMAGES ARCHIVED?: No VIEWS USED: Saphenous-femoral junction, Proximal femoral vein, and Popliteal vein INTERPRETATION: No DVT visualized      Medications Ordered in ED Medications  oxyCODONE  (Oxy IR/ROXICODONE ) immediate release tablet 5 mg (5 mg Oral Given 02/15/24 1332)  ketorolac  (TORADOL ) 15 MG/ML injection 15 mg (15 mg Intramuscular Given 02/15/24 1330)    ED Course/ Medical Decision Making/ A&P                                 Medical Decision Making Amount and/or Complexity of Data Reviewed Labs: ordered. Radiology: ordered.  Risk Prescription drug management.   [name] is a 53 y.o. 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    Initial Ddx:  DVT, lumbar radiculopathy, spinal cord compression, cauda equina, spinal epidural abscess, spinal epidural hematoma, limb ischemia  MDM/Course:  Patient presents to the emergency department with left lower extremity pain.  No new back pain but does have a history of chronic back pain and leg pain from that.  No signs of spinal cord compression at this time such as bowel or bladder incontinence.  No chest pain or shortness of breath to suggest PE.  Says that it feels similar to his prior DVT but  now on the left side.  On his exam does not have significant swelling.  Still has good pulses in his foot there are no signs of ischemia.  No obvious deformities.  Intact neurologic exam in his legs as well so low concern for spinal cord compression that would warrant MRI.  Unfortunately we do not have ultrasound at this time so I did perform a bedside ultrasound which did show full compressibility of his veins in the left leg and was negative for DVT.  Was instructed that he will need to come back for a formal ultrasound tomorrow.  Upon reevaluation is complaining of severe knee pain and so did obtain  an x-ray.  There is no joint effusion or obvious deformity.  X-ray was negative.  Will go ahead and treat him for lumbar radiculopathy which I suspect his symptoms are coming from.  Instructed to follow-up with the spine doctor as an outpatient.  This patient presents to the ED for concern of complaints listed in HPI, this involves an extensive number of treatment options, and is a complaint that carries with it a high risk of complications and morbidity. Disposition including potential need for admission considered.   Dispo: DC Home. Return precautions discussed including, but not limited to, those listed in the AVS. Allowed pt time to ask questions which were answered fully prior to dc.  Records reviewed Outpatient Clinic Notes The following labs were independently interpreted: Chemistry and show no acute abnormality I independently reviewed the following imaging with scope of interpretation limited to determining acute life threatening conditions related to emergency care: Extremity x-ray(s) and agree with the radiologist interpretation with the following exceptions: none I personally reviewed and interpreted cardiac monitoring: normal sinus rhythm  I personally reviewed and interpreted the pt's EKG: see above for interpretation  I have reviewed the patients home medications and made adjustments as  needed  Portions of this note were generated with Dragon dictation software. Dictation errors may occur despite best attempts at proofreading.      Final Clinical Impression(s) / ED Diagnoses Final diagnoses:  Left leg pain  History of DVT (deep vein thrombosis)    Rx / DC Orders ED Discharge Orders          Ordered    US  Venous Img Lower Unilateral Left        02/15/24 1507    methylPREDNISolone  (MEDROL  DOSEPAK) 4 MG TBPK tablet        02/15/24 1512    methocarbamol  (ROBAXIN ) 500 MG tablet  2 times daily        02/15/24 1512              Lance Basket, MD 02/15/24 [phone]

## 2024-02-15 NOTE — Discharge Instructions (Signed)
 You were seen for your leg pain in the emergency department.   At home, please continue taking Tylenol  for your pain and over-the-counter lidocaine  patches.  Please also take the muscle relaxer we prescribed you (Robaxin ) as well as the steroids in case it is from a pinched nerve.    Check your MyChart online for the results of any tests that had not resulted by the time you left the emergency department.   Return tomorrow during normal business hours for an ultrasound of your leg.  Follow-up with your primary doctor in 2-3 days regarding your visit.  Follow-up with your spine doctor as well.  Return immediately to the emergency department if you experience any of the following: Worsening pain, bowel or bladder incontinence, or any other concerning symptoms.    Thank you for visiting our Emergency Department. It was a pleasure taking care of you today.

## 2024-02-16 DIAGNOSIS — M51372 Other intervertebral disc degeneration, lumbosacral region with discogenic back pain and lower extremity pain: Secondary | ICD-10-CM | POA: Diagnosis not present

## 2024-02-17 ENCOUNTER — Encounter (HOSPITAL_COMMUNITY): Payer: Self-pay | Admitting: Emergency Medicine

## 2024-02-17 ENCOUNTER — Other Ambulatory Visit: Payer: Self-pay

## 2024-02-17 ENCOUNTER — Emergency Department (HOSPITAL_COMMUNITY)

## 2024-02-17 ENCOUNTER — Emergency Department (HOSPITAL_COMMUNITY)
Admission: EM | Admit: 2024-02-17 | Discharge: 2024-02-17 | Disposition: A | Discharge status: Home / Self Care | Attending: Emergency Medicine | Admitting: Emergency Medicine

## 2024-02-17 DIAGNOSIS — T782XXA Anaphylactic shock, unspecified, initial encounter: Secondary | ICD-10-CM

## 2024-02-17 DIAGNOSIS — Z720 Tobacco use: Secondary | ICD-10-CM | POA: Insufficient documentation

## 2024-02-17 DIAGNOSIS — T7809XA Anaphylactic reaction due to other food products, initial encounter: Secondary | ICD-10-CM | POA: Insufficient documentation

## 2024-02-17 DIAGNOSIS — Z5941 Food insecurity: Secondary | ICD-10-CM | POA: Insufficient documentation

## 2024-02-17 DIAGNOSIS — R000 Tachycardia, unspecified: Secondary | ICD-10-CM | POA: Insufficient documentation

## 2024-02-17 DIAGNOSIS — Z86718 Personal history of other venous thrombosis and embolism: Secondary | ICD-10-CM | POA: Diagnosis not present

## 2024-02-17 DIAGNOSIS — Z7901 Long term (current) use of anticoagulants: Secondary | ICD-10-CM | POA: Insufficient documentation

## 2024-02-17 DIAGNOSIS — M79605 Pain in left leg: Secondary | ICD-10-CM | POA: Diagnosis not present

## 2024-02-17 DIAGNOSIS — R112 Nausea with vomiting, unspecified: Secondary | ICD-10-CM | POA: Diagnosis present

## 2024-02-17 DIAGNOSIS — M51372 Other intervertebral disc degeneration, lumbosacral region with discogenic back pain and lower extremity pain: Secondary | ICD-10-CM | POA: Diagnosis not present

## 2024-02-17 DIAGNOSIS — D72829 Elevated white blood cell count, unspecified: Secondary | ICD-10-CM | POA: Insufficient documentation

## 2024-02-17 LAB — CBC
HCT: 54.2 % — ABNORMAL HIGH (ref 39.0–52.0)
Hemoglobin: 19 g/dL — ABNORMAL HIGH (ref 13.0–17.0)
MCH: 32.8 pg (ref 26.0–34.0)
MCHC: 35.1 g/dL (ref 30.0–36.0)
MCV: 93.6 fL (ref 80.0–100.0)
Platelets: 338 10*3/uL (ref 150–400)
RBC: 5.79 MIL/uL (ref 4.22–5.81)
RDW: 11.9 % (ref 11.5–15.5)
WBC: 15.9 10*3/uL — ABNORMAL HIGH (ref 4.0–10.5)
nRBC: 0 % (ref 0.0–0.2)

## 2024-02-17 LAB — TROPONIN I (HIGH SENSITIVITY)
Troponin I (High Sensitivity): 4 ng/L (ref <18)
Troponin I (High Sensitivity): 10 ng/L (ref <18)

## 2024-02-17 LAB — BASIC METABOLIC PANEL WITH GFR
Anion gap: 11 (ref 5–15)
BUN: 19 mg/dL (ref 6–20)
CO2: 29 mmol/L (ref 22–32)
Calcium: 10.1 mg/dL (ref 8.9–10.3)
Chloride: 100 mmol/L (ref 98–111)
Creatinine, Ser: 1.4 mg/dL — ABNORMAL HIGH (ref 0.61–1.24)
GFR, Estimated: >60 mL/min (ref >60)
Glucose, Bld: 110 mg/dL — ABNORMAL HIGH (ref 70–99)
Potassium: 3.8 mmol/L (ref 3.5–5.1)
Sodium: 140 mmol/L (ref 135–145)

## 2024-02-17 LAB — RAPID URINE DRUG SCREEN, HOSP PERFORMED
Amphetamines: NOT DETECTED
Barbiturates: NOT DETECTED
Benzodiazepines: NOT DETECTED
Cocaine: POSITIVE — AB
Opiates: NOT DETECTED
Tetrahydrocannabinol: POSITIVE — AB

## 2024-02-17 MED ORDER — DIPHENHYDRAMINE HCL 50 MG/ML IJ SOLN
25.0000 mg | Freq: Once | INTRAMUSCULAR | Status: AC
  Administered 2024-02-17: 25 mg via INTRAVENOUS
  Filled 2024-02-17: qty 1

## 2024-02-17 MED ORDER — SODIUM CHLORIDE 0.9 % IV BOLUS
1000.0000 mL | Freq: Once | INTRAVENOUS | Status: AC
  Administered 2024-02-17: 1000 mL via INTRAVENOUS

## 2024-02-17 MED ORDER — EPINEPHRINE 0.3 MG/0.3ML IJ SOAJ: 0.3000 mg | INTRAMUSCULAR | 1 refills | Status: DC | PRN

## 2024-02-17 MED ORDER — DEXAMETHASONE SODIUM PHOSPHATE 10 MG/ML IJ SOLN
10.0000 mg | Freq: Once | INTRAMUSCULAR | Status: AC
  Administered 2024-02-17: 10 mg via INTRAVENOUS
  Filled 2024-02-17: qty 1

## 2024-02-17 MED ORDER — SODIUM CHLORIDE 0.9 % IV SOLN
25.0000 mg | Freq: Once | INTRAVENOUS | Status: AC
  Administered 2024-02-17: 25 mg via INTRAVENOUS
  Filled 2024-02-17: qty 1

## 2024-02-17 MED ORDER — FAMOTIDINE IN NACL 20-0.9 MG/50ML-% IV SOLN
20.0000 mg | Freq: Once | INTRAVENOUS | Status: AC
  Administered 2024-02-17: 20 mg via INTRAVENOUS
  Filled 2024-02-17: qty 50

## 2024-02-17 MED ORDER — EPINEPHRINE 0.3 MG/0.3ML IJ SOAJ
0.3000 mg | Freq: Once | INTRAMUSCULAR | Status: AC
  Administered 2024-02-17: 0.3 mg via INTRAMUSCULAR
  Filled 2024-02-17: qty 0.3

## 2024-02-17 NOTE — ED Provider Notes (Signed)
 [facility] AT [facility] Provider Note   CSN: [id number] Arrival date & time: 02/17/24  1333     History  Chief Complaint  Patient presents with   Allergic Reaction    Lance Cohen is a 53 y.o. male.  And is presenting with possible allergic reaction.  He said he started with nausea vomiting and broke out in hives after eating a sausage biscuit from [employer].  He has had allergic reactions before to different medications.  He was here a few days ago for leg pain, given prescription for Medrol  Dosepak and Robaxin .  He said he did not have any money for the medication so has not taken them yet.  He does state he is allergic to steroids though, but looks like palpitations and has taken them before.  Multiple other medication allergies.  Denies having taken anything new though.  He endorses chronic pain in his neck and back.  During his last ED visit they ordered him a next a venous ultrasound which he never obtained.  The history is provided by the patient.  Allergic Reaction Presenting symptoms: difficulty breathing, itching and rash   Difficulty breathing:    Severity:  Moderate   Onset quality:  Sudden   Duration:  1 hour   Timing:  Constant   Progression:  Unchanged Severity:  Moderate Duration:  1 hour Prior allergic episodes:  Allergies to medications Relieved by:  None tried Worsened by:  Nothing Ineffective treatments:  None tried      Home Medications Prior to Admission medications   Medication Sig Start Date End Date Taking? Authorizing Provider  apixaban  (ELIQUIS ) 5 MG TABS tablet Take 1 tablet (5 mg total) by mouth 2 (two) times daily. 12/15/23   Meldon Sport, MD  buPROPion  (WELLBUTRIN  XL) 150 MG 24 hr tablet Take 1 tablet (150 mg total) by mouth daily. 12/15/23   Meldon Sport, MD  clindamycin  (CLEOCIN ) 300 MG capsule Take 1 capsule (300 mg total) by mouth 3 (three) times daily. 01/02/24   Ascencion Lava, DMD  EPINEPHrine  0.3  mg/0.3 mL IJ SOAJ injection Inject 0.3 mg into the muscle as needed for anaphylaxis. 06/07/23   [provider]  gabapentin  (NEURONTIN ) 300 MG capsule Take 1 capsule (300 mg total) by mouth 3 (three) times daily. 11/13/23   Darrin Emerald, MD  methocarbamol  (ROBAXIN ) 500 MG tablet Take 1 tablet (500 mg total) by mouth 2 (two) times daily. 02/15/24   Ninetta Basket, MD  methocarbamol  (ROBAXIN ) 750 MG tablet Take 1 tablet (750 mg total) by mouth 4 (four) times daily. 11/13/23   Darrin Emerald, MD  methylPREDNISolone  (MEDROL  DOSEPAK) 4 MG TBPK tablet Take as directed on packaging 02/15/24   Ninetta Basket, MD  QUEtiapine  (SEROQUEL ) 25 MG tablet Take 1 tablet (25 mg total) by mouth at bedtime. 12/15/23   Patel, Rutwik K, MD  SYMBICORT 80-4.5 MCG/ACT inhaler Inhale 2 puffs into the lungs 2 (two) times daily. 05/26/23   [provider]      Allergies    Lovenox  [enoxaparin  sodium], Bee venom, Penicillins, Zofran  [ondansetron ], Other, and Sulfa  antibiotics    Review of Systems   Review of Systems  Constitutional:  Negative for fever.  Eyes:  Negative for visual disturbance.  Respiratory:  Positive for shortness of breath.   Cardiovascular:  Positive for chest pain.  Gastrointestinal:  Positive for abdominal pain, nausea and vomiting.  Genitourinary:  Negative for dysuria.  Musculoskeletal:  Positive for back pain and neck pain.  Skin:  Positive for itching and rash.    Physical Exam Updated Vital Signs BP 94/66 (BP Location: Left Arm)   Pulse (!) 109   Temp 97.8 F (36.6 C) (Oral)   Resp (!) 23   Ht 5\' 10"  (1.778 m)   Wt 83.9 kg   SpO2 97% Comment: Simultaneous filing. User may not have seen previous data.  BMI 26.54 kg/m  Physical Exam Vitals and nursing note reviewed.  Constitutional:      General: He is not in acute distress.    Appearance: Normal appearance. He is well-developed.  HENT:     Head: Normocephalic and atraumatic.  Eyes:      Conjunctiva/sclera: Conjunctivae normal.  Cardiovascular:     Rate and Rhythm: Regular rhythm. Tachycardia present.     Heart sounds: No murmur heard. Pulmonary:     Effort: Pulmonary effort is normal. No respiratory distress.     Breath sounds: Normal breath sounds.  Abdominal:     Palpations: Abdomen is soft.     Tenderness: There is no abdominal tenderness. There is no guarding or rebound.  Musculoskeletal:        General: No swelling.     Cervical back: Neck supple.  Skin:    General: Skin is warm and dry.     Capillary Refill: Capillary refill takes less than 2 seconds.     Findings: Rash present.  Neurological:     General: No focal deficit present.     Mental Status: He is alert.     ED Results / Procedures / Treatments   Labs (all labs ordered are listed, but only abnormal results are displayed) Labs Reviewed  CBC - Abnormal; Notable for the following components:      Result Value   WBC 15.9 (*)    Hemoglobin 19.0 (*)    HCT 54.2 (*)    All other components within normal limits  BASIC METABOLIC PANEL WITH GFR - Abnormal; Notable for the following components:   Glucose, Bld 110 (*)    Creatinine, Ser 1.40 (*)    All other components within normal limits  RAPID URINE DRUG SCREEN, HOSP PERFORMED  TROPONIN I (HIGH SENSITIVITY)  TROPONIN I (HIGH SENSITIVITY)    EKG EKG Interpretation Date/Time:  Tuesday February 17 2024 14:05:05 EDT Ventricular Rate:  86 PR Interval:  144 QRS Duration:  86 QT Interval:  361 QTC Calculation: 432 R Axis:   70  Text Interpretation: Sinus rhythm No significant change since prior 9/24 Confirmed by Racheal Buddle [phone]) on 02/17/2024 2:10:42 PM  Radiology US  Venous Img Lower  Left (DVT Study) Result Date: 02/17/2024 CLINICAL DATA:  Left leg pain for a week. Previous DVT in the right leg. EXAM: Left LOWER EXTREMITY VENOUS DOPPLER ULTRASOUND TECHNIQUE: Gray-scale sonography with graded compression, as well as color Doppler and duplex  ultrasound were performed to evaluate the lower extremity deep venous systems from the level of the common femoral vein and including the common femoral, femoral, profunda femoral, popliteal and calf veins including the posterior tibial, peroneal and gastrocnemius veins when visible. The superficial great saphenous vein was also interrogated. Spectral Doppler was utilized to evaluate flow at rest and with distal augmentation maneuvers in the common femoral, femoral and popliteal veins. COMPARISON:  Ultrasound 06/10/2023. FINDINGS: Contralateral Common Femoral Vein: Respiratory phasicity is normal and symmetric with the symptomatic side. No evidence of thrombus. Normal compressibility. Common Femoral Vein: No evidence of thrombus. Normal  compressibility, respiratory phasicity and response to augmentation. Saphenofemoral Junction: No evidence of thrombus. Normal compressibility and flow on color Doppler imaging. Profunda Femoral Vein: No evidence of thrombus. Normal compressibility and flow on color Doppler imaging. Femoral Vein: No evidence of thrombus. Normal compressibility, respiratory phasicity and response to augmentation. Popliteal Vein: No evidence of thrombus. Normal compressibility, respiratory phasicity and response to augmentation. Calf Veins: No evidence of thrombus. Normal compressibility and flow on color Doppler imaging. Superficial Great Saphenous Vein: No evidence of thrombus. Normal compressibility. Venous Reflux:  None. Other Findings:  None. IMPRESSION: No evidence of left lower extremity DVT. Electronically Signed   By: Adrianna Horde M.D.   On: 02/17/2024 15:35    Procedures .Critical Care  Performed by: Tonya Fredrickson, MD Authorized by: Tonya Fredrickson, MD   Critical care provider statement:    Critical care time (minutes):  30   Critical care was necessary to treat or prevent imminent or life-threatening deterioration of the following conditions: anaphylatic reaction.   Critical  care was time spent personally by me on the following activities:  Development of treatment plan with patient or surrogate, discussions with consultants, evaluation of patient's response to treatment, examination of patient, ordering and review of laboratory studies, ordering and review of radiographic studies, ordering and performing treatments and interventions, pulse oximetry, re-evaluation of patient's condition and review of old charts     Medications Ordered in ED Medications  diphenhydrAMINE  (BENADRYL ) injection 25 mg (25 mg Intravenous Given 02/17/24 1420)  EPINEPHrine  (EPI-PEN) injection 0.3 mg (0.3 mg Intramuscular Given 02/17/24 1414)  famotidine  (PEPCID ) IVPB 20 mg premix (0 mg Intravenous Stopped 02/17/24 1454)  promethazine  (PHENERGAN ) 25 mg in sodium chloride  0.9 % 50 mL IVPB (0 mg Intravenous Stopped 02/17/24 1643)  dexamethasone  (DECADRON ) injection 10 mg (10 mg Intravenous Given 02/17/24 1505)  sodium chloride  0.9 % bolus 1,000 mL (0 mLs Intravenous Stopped 02/17/24 1617)    ED Course/ Medical Decision Making/ A&P Clinical Course as of 02/17/24 1711  Tue Feb 17, 2024  1518 Received sign out from Dr. Randal Bury presenting with allergic reaction. Received steroids and epi pen. Will obs. Also having leg pain pending US  DVT [WS]    Clinical Course User Index [WS] Mordecai Applebaum, MD                                 Medical Decision Making Amount and/or Complexity of Data Reviewed Labs: ordered.  Risk Prescription drug management.   This patient complains of possible allergic reaction, rash, shortness of breath; this involves an extensive number of treatment Options and is a complaint that carries with it a high risk of complications and morbidity. The differential includes allergic reaction, anaphylaxis, anaphylactoid reaction  I ordered, reviewed and interpreted labs, which included CBC with elevated white count, chemistries elevated creatinine, troponins flat, talk screen  ordered I ordered medication IM epi, Benadryl  Pepcid  steroids fluids Phenergan  and reviewed PMP when indicated. I ordered imaging studies which included duplex left lower extremity and I independently    visualized and interpreted imaging which showed no acute findings Previous records obtained and reviewed in epic including recent ED visits Cardiac monitoring reviewed, normal sinus rhythm Social determinants considered, tobacco use and food insecurity Critical Interventions: Medications for possible anaphylactic reaction  After the interventions stated above, I reevaluated the patient and found patient still to be rather restless although hemodynamically stable Admission and further testing considered,  his care is signed out to ED physician Dr.Scheving to continue observation.  Anticipate if symptoms improve would be able to be discharged.         Final Clinical Impression(s) / ED Diagnoses Final diagnoses:  Anaphylaxis, initial encounter    Rx / DC Orders ED Discharge Orders     None         Tonya Fredrickson, MD 02/17/24 1714

## 2024-02-17 NOTE — ED Notes (Signed)
 Pt provided urinal and informed urine sample needed.

## 2024-02-17 NOTE — ED Notes (Signed)
Update provided to pt's wife.

## 2024-02-17 NOTE — Discharge Instructions (Addendum)
 We evaluated you for your allergic reaction.  We did have to give you an EpiPen .  We have prescribed you additional EpiPen  to use at home if needed.  If you have any recurrent symptoms like swelling to your face, trouble breathing, vomiting or dizziness along with hives then please use her EpiPen .  Please return to the emergency department if you have to use your EpiPen .  We also obtained an ultrasound of your leg and this was negative for any blood clots.

## 2024-02-17 NOTE — ED Provider Notes (Signed)
    ED Course / MDM   Clinical Course as of 02/17/24 1808  Tue Feb 17, 2024  1518 Received sign out from Dr. Randal Bury presenting with allergic reaction. Received steroids and epi pen. Will obs. Also having leg pain pending US  DVT [WS]  1807 Patient has remained stable.  His DVT ultrasound is negative.  Discussed results with the patient.  Will prescribe additional EpiPen .  Will discharge patient to home. All questions answered. Patient comfortable with plan of discharge. Return precautions discussed with patient and specified on the after visit summary.  [WS]    Clinical Course User Index [WS] Mordecai Applebaum, MD   Medical Decision Making Amount and/or Complexity of Data Reviewed Labs: ordered.  Risk Prescription drug management.         Mordecai Applebaum, MD 02/17/24 [phone]

## 2024-02-17 NOTE — ED Triage Notes (Signed)
 Pt c/o of allergic reaction to unknown cause. Pt is covered in hives and states it is hard to breath.

## 2024-02-18 ENCOUNTER — Encounter: Admitting: Physical Medicine and Rehabilitation

## 2024-02-18 DIAGNOSIS — M51372 Other intervertebral disc degeneration, lumbosacral region with discogenic back pain and lower extremity pain: Secondary | ICD-10-CM | POA: Diagnosis not present

## 2024-02-19 DIAGNOSIS — M51372 Other intervertebral disc degeneration, lumbosacral region with discogenic back pain and lower extremity pain: Secondary | ICD-10-CM | POA: Diagnosis not present

## 2024-02-20 DIAGNOSIS — M51372 Other intervertebral disc degeneration, lumbosacral region with discogenic back pain and lower extremity pain: Secondary | ICD-10-CM | POA: Diagnosis not present

## 2024-02-21 DIAGNOSIS — M51372 Other intervertebral disc degeneration, lumbosacral region with discogenic back pain and lower extremity pain: Secondary | ICD-10-CM | POA: Diagnosis not present

## 2024-02-22 DIAGNOSIS — M51372 Other intervertebral disc degeneration, lumbosacral region with discogenic back pain and lower extremity pain: Secondary | ICD-10-CM | POA: Diagnosis not present

## 2024-02-23 ENCOUNTER — Ambulatory Visit (INDEPENDENT_AMBULATORY_CARE_PROVIDER_SITE_OTHER): Payer: Self-pay

## 2024-02-23 VITALS — BP 95/65 | HR 82 | Ht 71.0 in | Wt 184.0 lb

## 2024-02-23 DIAGNOSIS — F5105 Insomnia due to other mental disorder: Secondary | ICD-10-CM | POA: Diagnosis not present

## 2024-02-23 DIAGNOSIS — J45909 Unspecified asthma, uncomplicated: Secondary | ICD-10-CM

## 2024-02-23 DIAGNOSIS — F99 Mental disorder, not otherwise specified: Secondary | ICD-10-CM

## 2024-02-23 DIAGNOSIS — M5417 Radiculopathy, lumbosacral region: Secondary | ICD-10-CM | POA: Diagnosis not present

## 2024-02-23 DIAGNOSIS — M51372 Other intervertebral disc degeneration, lumbosacral region with discogenic back pain and lower extremity pain: Secondary | ICD-10-CM | POA: Diagnosis not present

## 2024-02-23 MED ORDER — GABAPENTIN 300 MG PO CAPS: 300.0000 mg | ORAL_CAPSULE | Freq: Three times a day (TID) | ORAL | Status: DC

## 2024-02-23 MED ORDER — SYMBICORT 80-4.5 MCG/ACT IN AERO: 2.0000 | INHALATION_SPRAY | Freq: Two times a day (BID) | RESPIRATORY_TRACT | 5 refills | Status: DC

## 2024-02-23 MED ORDER — MIRTAZAPINE 7.5 MG PO TABS: 7.5000 mg | ORAL_TABLET | Freq: Every day | ORAL | 5 refills | Status: DC

## 2024-02-23 NOTE — Progress Notes (Unsigned)
 Established Patient Office Visit  Subjective   Patient ID: Lance Cohen, male    DOB: 1971/08/24  Age: 53 y.o. MRN: [id number]  Chief Complaint  Patient presents with   Medical Management of Chronic Issues    Pt here for a follow-up    HPI Back pain and insomnia  Past Medical History:  Diagnosis Date   Anxiety    Arthritis    Asthma    Bipolar 1 disorder (HCC)    Bradycardia    Carpal tunnel syndrome 2023   Chronic knee pain    COPD (chronic obstructive pulmonary disease) (HCC)    Depression    Dilated aortic root (HCC)    DVT (deep venous thrombosis) (HCC)    Dysrhythmia    H/o blood clots in right leg 2022   History of kidney stones    Hypertension    Insomnia    Medical history non-contributory    Sinus bradycardia    Spinal stenosis    Spinal stenosis    Spinal stenosis 2022   Suicide attempt (HCC)    2010 Intentional overdose attempt with Depakote.   Past Surgical History:  Procedure Laterality Date   COLONOSCOPY WITH PROPOFOL  N/A 03/18/2019   Procedure: COLONOSCOPY WITH PROPOFOL ;  Surgeon: Suzette Espy, MD;  [facility];  Service: Endoscopy;  Laterality: N/A;  2:30pm   FOOT SURGERY     had peice of wire removed   IVC FILTER REMOVAL N/A 02/24/2017   Procedure: IVC Filter Removal;  Surgeon: Celso College, MD;  Location: [facility] INVASIVE CV LAB;  Service: Cardiovascular;  Laterality: N/A;   MULTIPLE TOOTH EXTRACTIONS     ORIF RADIAL FRACTURE Right 12/05/2020   Procedure: OPEN REDUCTION INTERNAL FIXATION (ORIF) RADIAL FRACTURE and ULNA FRACTURE;  Surgeon: Darrin Emerald, MD;  Location: [facility];  Service: Orthopedics;  Laterality: Right;   PERIPHERAL VASCULAR CATHETERIZATION Right 10/22/2016   Procedure: Thrombectomy, thrombolyisis;  Surgeon: Jackquelyn Mass, MD;  Location: [facility] INVASIVE CV LAB;  Service: Cardiovascular;  Laterality: Right;   PERIPHERAL VASCULAR CATHETERIZATION N/A 10/22/2016   Procedure: IVC Filter Insertion;  Surgeon:  Jackquelyn Mass, MD;  Location: [facility] INVASIVE CV LAB;  Service: Cardiovascular;  Laterality: N/A;   POLYPECTOMY  03/18/2019   Procedure: POLYPECTOMY;  Surgeon: Suzette Espy, MD;  [facility];  Service: Endoscopy;;   TOOTH EXTRACTION N/A 01/02/2024   Procedure: EXTRACTION TEETH NUMBER THREE, FOUR, FIVE, SIX, TEEN, ELEVEN, TWELVE, THIRTEEN, FOURTEEN, THIRTY ONE, ALVEOPLASTY;  Surgeon: Ascencion Lava, DMD;  Location: [facility];  Service: Oral Surgery;  Laterality: N/A;      ROS    Objective:     BP 95/65   Pulse 82   Ht 5\' 11"  (1.803 m)   Wt 184 lb 0.6 oz (83.5 kg)   SpO2 98%   BMI 25.67 kg/m  BP Readings from Last 3 Encounters:  02/23/24 95/65  02/17/24 109/72  02/15/24 105/77   Wt Readings from Last 3 Encounters:  02/23/24 184 lb 0.6 oz (83.5 kg)  02/17/24 185 lb (83.9 kg)  02/15/24 185 lb (83.9 kg)      Physical Exam Vitals reviewed.  Constitutional:      General: He is not in acute distress.    Appearance: He is not diaphoretic.  HENT:     Head: Normocephalic and atraumatic.     Nose: Nose normal.     Mouth/Throat:     Mouth: Mucous membranes are moist.  Eyes:  General: No scleral icterus.    Extraocular Movements: Extraocular movements intact.  Cardiovascular:     Rate and Rhythm: Normal rate and regular rhythm.     Heart sounds: Normal heart sounds. No murmur heard. Pulmonary:     Breath sounds: Normal breath sounds. No wheezing or rales.  Musculoskeletal:     Right shoulder: Tenderness present. Decreased range of motion.     Cervical back: Neck supple. No tenderness.     Right lower leg: No edema.     Left lower leg: No edema.  Skin:    General: Skin is warm.     Findings: No rash.  Neurological:     General: No focal deficit present.     Mental Status: He is alert and oriented to person, place, and time.  Psychiatric:        Mood and Affect: Mood is anxious.        Behavior: Behavior is cooperative.      No results found for any  visits on 02/23/24.    The 10-year ASCVD risk score (Arnett DK, et al., 2019) is: 3.1%    Assessment & Plan:   Problem List Items Addressed This Visit       Respiratory   Asthma   Stable with current medications.  Symbicort renewed.       Relevant Medications   SYMBICORT 80-4.5 MCG/ACT inhaler     Nervous and Auditory   Lumbosacral radiculopathy - Primary   Chronic in nature.  Gabapentin  renewed.        Relevant Medications   gabapentin  (NEURONTIN ) 300 MG capsule   mirtazapine  (REMERON ) 7.5 MG tablet     Other   Insomnia due to other mental disorder   Will add Remeron  for continued insomnia.  Advised to watch for worsening of insomnia or any anxiety and/or depression.   Recommend f/u in 3 months      Relevant Medications   mirtazapine  (REMERON ) 7.5 MG tablet    No follow-ups on file.    Alison Irvine, FNP

## 2024-02-24 DIAGNOSIS — M51372 Other intervertebral disc degeneration, lumbosacral region with discogenic back pain and lower extremity pain: Secondary | ICD-10-CM | POA: Diagnosis not present

## 2024-02-24 DIAGNOSIS — F5105 Insomnia due to other mental disorder: Secondary | ICD-10-CM | POA: Insufficient documentation

## 2024-02-24 NOTE — Assessment & Plan Note (Signed)
 Will add Remeron  for continued insomnia.  Advised to watch for worsening of insomnia or any anxiety and/or depression.   Recommend f/u in 3 months

## 2024-02-24 NOTE — Assessment & Plan Note (Signed)
 Chronic in nature.  Gabapentin  renewed.

## 2024-02-24 NOTE — Assessment & Plan Note (Signed)
 Stable with current medications.  Symbicort renewed.

## 2024-02-25 DIAGNOSIS — M51372 Other intervertebral disc degeneration, lumbosacral region with discogenic back pain and lower extremity pain: Secondary | ICD-10-CM | POA: Diagnosis not present

## 2024-02-26 DIAGNOSIS — M51372 Other intervertebral disc degeneration, lumbosacral region with discogenic back pain and lower extremity pain: Secondary | ICD-10-CM | POA: Diagnosis not present

## 2024-02-27 DIAGNOSIS — M51372 Other intervertebral disc degeneration, lumbosacral region with discogenic back pain and lower extremity pain: Secondary | ICD-10-CM | POA: Diagnosis not present

## 2024-02-28 DIAGNOSIS — M51372 Other intervertebral disc degeneration, lumbosacral region with discogenic back pain and lower extremity pain: Secondary | ICD-10-CM | POA: Diagnosis not present

## 2024-02-29 DIAGNOSIS — M51372 Other intervertebral disc degeneration, lumbosacral region with discogenic back pain and lower extremity pain: Secondary | ICD-10-CM | POA: Diagnosis not present

## 2024-03-01 DIAGNOSIS — M51372 Other intervertebral disc degeneration, lumbosacral region with discogenic back pain and lower extremity pain: Secondary | ICD-10-CM | POA: Diagnosis not present

## 2024-03-01 DIAGNOSIS — Z419 Encounter for procedure for purposes other than remedying health state, unspecified: Secondary | ICD-10-CM | POA: Diagnosis not present

## 2024-03-02 DIAGNOSIS — M51372 Other intervertebral disc degeneration, lumbosacral region with discogenic back pain and lower extremity pain: Secondary | ICD-10-CM | POA: Diagnosis not present

## 2024-03-03 DIAGNOSIS — M51372 Other intervertebral disc degeneration, lumbosacral region with discogenic back pain and lower extremity pain: Secondary | ICD-10-CM | POA: Diagnosis not present

## 2024-03-04 DIAGNOSIS — M51372 Other intervertebral disc degeneration, lumbosacral region with discogenic back pain and lower extremity pain: Secondary | ICD-10-CM | POA: Diagnosis not present

## 2024-03-05 DIAGNOSIS — M51372 Other intervertebral disc degeneration, lumbosacral region with discogenic back pain and lower extremity pain: Secondary | ICD-10-CM | POA: Diagnosis not present

## 2024-03-06 DIAGNOSIS — M51372 Other intervertebral disc degeneration, lumbosacral region with discogenic back pain and lower extremity pain: Secondary | ICD-10-CM | POA: Diagnosis not present

## 2024-03-07 DIAGNOSIS — M51372 Other intervertebral disc degeneration, lumbosacral region with discogenic back pain and lower extremity pain: Secondary | ICD-10-CM | POA: Diagnosis not present

## 2024-03-08 DIAGNOSIS — M51372 Other intervertebral disc degeneration, lumbosacral region with discogenic back pain and lower extremity pain: Secondary | ICD-10-CM | POA: Diagnosis not present

## 2024-03-09 ENCOUNTER — Ambulatory Visit: Admitting: Orthopedic Surgery

## 2024-03-09 ENCOUNTER — Ambulatory Visit: Payer: Self-pay | Admitting: Internal Medicine

## 2024-03-09 DIAGNOSIS — M51372 Other intervertebral disc degeneration, lumbosacral region with discogenic back pain and lower extremity pain: Secondary | ICD-10-CM | POA: Diagnosis not present

## 2024-03-10 ENCOUNTER — Ambulatory Visit: Admitting: Orthopedic Surgery

## 2024-03-10 DIAGNOSIS — M51372 Other intervertebral disc degeneration, lumbosacral region with discogenic back pain and lower extremity pain: Secondary | ICD-10-CM | POA: Diagnosis not present

## 2024-03-10 NOTE — Therapy (Deleted)
 OUTPATIENT PHYSICAL THERAPY CERVICAL EVALUATION   Patient Name: Lance Cohen MRN: [id number] DOB:[birth year], 53 y.o., male Today's Date: 03/10/2024  END OF SESSION:   Past Medical History:  Diagnosis Date   Anxiety    Arthritis    Asthma    Bipolar 1 disorder (HCC)    Bradycardia    Carpal tunnel syndrome 2023   Chronic knee pain    COPD (chronic obstructive pulmonary disease) (HCC)    Depression    Dilated aortic root (HCC)    DVT (deep venous thrombosis) (HCC)    Dysrhythmia    H/o blood clots in right leg 2022   History of kidney stones    Hypertension    Insomnia    Medical history non-contributory    Sinus bradycardia    Spinal stenosis    Spinal stenosis    Spinal stenosis 2022   Suicide attempt (HCC)    2010 Intentional overdose attempt with Depakote.   Past Surgical History:  Procedure Laterality Date   COLONOSCOPY WITH PROPOFOL  N/A 03/18/2019   Procedure: COLONOSCOPY WITH PROPOFOL ;  Surgeon: Suzette Espy, MD;  [facility];  Service: Endoscopy;  Laterality: N/A;  2:30pm   FOOT SURGERY     had peice of wire removed   IVC FILTER REMOVAL N/A 02/24/2017   Procedure: IVC Filter Removal;  Surgeon: Celso College, MD;  Location: [facility] INVASIVE CV LAB;  Service: Cardiovascular;  Laterality: N/A;   MULTIPLE TOOTH EXTRACTIONS     ORIF RADIAL FRACTURE Right 12/05/2020   Procedure: OPEN REDUCTION INTERNAL FIXATION (ORIF) RADIAL FRACTURE and ULNA FRACTURE;  Surgeon: Darrin Emerald, MD;  Location: [facility];  Service: Orthopedics;  Laterality: Right;   PERIPHERAL VASCULAR CATHETERIZATION Right 10/22/2016   Procedure: Thrombectomy, thrombolyisis;  Surgeon: Jackquelyn Mass, MD;  Location: [facility] INVASIVE CV LAB;  Service: Cardiovascular;  Laterality: Right;   PERIPHERAL VASCULAR CATHETERIZATION N/A 10/22/2016   Procedure: IVC Filter Insertion;  Surgeon: Jackquelyn Mass, MD;  Location: [facility] INVASIVE CV LAB;  Service: Cardiovascular;  Laterality: N/A;    POLYPECTOMY  03/18/2019   Procedure: POLYPECTOMY;  Surgeon: Suzette Espy, MD;  [facility];  Service: Endoscopy;;   TOOTH EXTRACTION N/A 01/02/2024   Procedure: EXTRACTION TEETH NUMBER THREE, FOUR, FIVE, SIX, TEEN, ELEVEN, TWELVE, THIRTEEN, FOURTEEN, THIRTY ONE, ALVEOPLASTY;  Surgeon: Ascencion Lava, DMD;  Location: [facility];  Service: Oral Surgery;  Laterality: N/A;   Patient Active Problem List   Diagnosis Date Noted   Insomnia due to other mental disorder 02/24/2024   Degeneration of lumbosacral intervertebral disc 11/12/2023   Chronic right shoulder pain 10/13/2023   Hemorrhoids 08/14/2023   Encounter for general adult medical examination with abnormal findings 08/14/2023   Mixed hyperlipidemia 08/14/2023   Lumbosacral radiculopathy 08/14/2023   Polysubstance abuse (HCC) 08/14/2023   Seizure (HCC) 11/27/2020   Myofascial pain dysfunction syndrome 11/22/2020   Cocaine abuse (HCC) 11/18/2020   Foreign body (FB) in soft tissue 09/08/2020   Chronic low back pain 05/11/2020   Cervical radiculopathy 02/18/2020   Impingement syndrome of right shoulder 01/18/2020   GERD (gastroesophageal reflux disease) 06/29/2019   Rectal bleeding 02/22/2019   Chest pain 10/19/2017   Asthma 10/19/2017   Hypertension 10/19/2017   Arthralgia of right knee 10/19/2017   History of pulmonary embolus (PE) 10/23/2016   Nausea and vomiting 10/23/2016   S/P IVC filter 10/23/2016   Chronic deep vein thrombosis (DVT) (HCC) 10/18/2016   Bipolar 1 disorder (HCC) 06/24/2011  Tobacco abuse 06/24/2011   CARPAL TUNNEL SYNDROME, RIGHT 06/21/2010    PCP: ***  REFERRING PROVIDER: Diedra Fowler, MD  REFERRING DIAG: M54.12 (ICD-10-CM) - Radiculopathy, cervical region  THERAPY DIAG:  No diagnosis found.  Rationale for Evaluation and Treatment: Rehabilitation  ONSET DATE: ***  SUBJECTIVE:                                                                                                                                                                                                          SUBJECTIVE STATEMENT: *** Hand dominance: {MISC; OT HAND DOMINANCE:[id number]}  PERTINENT HISTORY:  ***  PAIN:  Are you having pain? {OPRCPAIN:27236}  PRECAUTIONS: {Therapy precautions:24002}  RED FLAGS: {PT Red Flags:29287}     WEIGHT BEARING RESTRICTIONS: {Yes ***/No:24003}  FALLS:  Has patient fallen in last 6 months? {fallsyesno:27318}  LIVING ENVIRONMENT: Lives with: {OPRC lives with:25569::"lives with their family"} Lives in: {Lives in:25570} Stairs: {opstairs:27293} Has following equipment at home: {Assistive devices:23999}  OCCUPATION: ***  PLOF: {PLOF:24004}  PATIENT GOALS: ***  NEXT MD VISIT: ***  OBJECTIVE:  Note: Objective measures were completed at Evaluation unless otherwise noted.  DIAGNOSTIC FINDINGS:  IMPRESSION: 1. Congenital cervical spinal canal narrowing, with exacerbated of mild spinal stenosis by disc degeneration at both C4-C5 and C6-C7, stable since 2021. Mild spinal cord mass effect at both of those levels but no cord signal abnormality. 2. Superimposed foraminal disc and endplate degeneration with widespread moderate and severe neural foraminal stenosis, severe and progressed since 2021 at the right C4 and bilateral C6 nerve levels.    PATIENT SURVEYS:  {rehab surveys:24030}  COGNITION: Overall cognitive status: {cognition:24006}  SENSATION: {sensation:27233}  POSTURE: {posture:25561}  PALPATION: ***   CERVICAL ROM:   {AROM/PROM:27142} ROM A/PROM (deg) eval  Flexion   Extension   Right lateral flexion   Left lateral flexion   Right rotation   Left rotation    (Blank rows = not tested)  UPPER EXTREMITY ROM:  {AROM/PROM:27142} ROM Right eval Left eval  Shoulder flexion    Shoulder extension    Shoulder abduction    Shoulder adduction    Shoulder extension    Shoulder internal rotation    Shoulder external rotation     Elbow flexion    Elbow extension    Wrist flexion    Wrist extension    Wrist ulnar deviation    Wrist radial deviation    Wrist pronation    Wrist supination     (Blank rows = not tested)  UPPER EXTREMITY MMT:  MMT Right eval Left  eval  Shoulder flexion    Shoulder extension    Shoulder abduction    Shoulder adduction    Shoulder extension    Shoulder internal rotation    Shoulder external rotation    Middle trapezius    Lower trapezius    Elbow flexion    Elbow extension    Wrist flexion    Wrist extension    Wrist ulnar deviation    Wrist radial deviation    Wrist pronation    Wrist supination    Grip strength     (Blank rows = not tested)  CERVICAL SPECIAL TESTS:  {Cervical special tests:25246}  FUNCTIONAL TESTS:  {Functional tests:24029}  TREATMENT DATE: ***                                                                                                                                 PATIENT EDUCATION:  Education details: *** Person educated: {Person educated:25204} Education method: {Education Method:25205} Education comprehension: {Education Comprehension:25206}  HOME EXERCISE PROGRAM: ***  ASSESSMENT:  CLINICAL IMPRESSION: Patient is a *** y.o. *** who was seen today for physical therapy evaluation and treatment for ***.   OBJECTIVE IMPAIRMENTS: {opptimpairments:25111}.   ACTIVITY LIMITATIONS: {activitylimitations:27494}  PARTICIPATION LIMITATIONS: {participationrestrictions:25113}  PERSONAL FACTORS: {Personal factors:25162} are also affecting patient's functional outcome.   REHAB POTENTIAL: {rehabpotential:25112}  CLINICAL DECISION MAKING: {clinical decision making:25114}  EVALUATION COMPLEXITY: {Evaluation complexity:25115}   GOALS: Goals reviewed with patient? {yes/no:20286}  SHORT TERM GOALS: Target date: ***  *** Baseline:  Goal status: INITIAL  2.  *** Baseline:  Goal status: INITIAL  3.  *** Baseline:  Goal  status: INITIAL  4.  *** Baseline:  Goal status: INITIAL  5.  *** Baseline:  Goal status: INITIAL  6.  *** Baseline:  Goal status: INITIAL  LONG TERM GOALS: Target date: ***  *** Baseline:  Goal status: INITIAL  2.  *** Baseline:  Goal status: INITIAL  3.  *** Baseline:  Goal status: INITIAL  4.  *** Baseline:  Goal status: INITIAL  5.  *** Baseline:  Goal status: INITIAL  6.  *** Baseline:  Goal status: INITIAL   PLAN:  PT FREQUENCY: {rehab frequency:25116}  PT DURATION: {rehab duration:25117}  PLANNED INTERVENTIONS: {rehab planned interventions:25118::"97110-Therapeutic exercises","97530- Therapeutic [phone]- Neuromuscular re-education","97535- Self FAOZ","30865- Manual therapy"}  PLAN FOR NEXT SESSION: ***   Jaeceon Michelin E Powell-Butler, PT 03/10/2024, 6:05 PM     Managed Medicaid Authorization Request  Visit Dx Codes: ***  Functional Tool Score: ***  For all possible CPT codes, reference the Planned Interventions line above.     Check all conditions that are expected to impact treatment: {Conditions expected to impact treatment:{Conditions expected to impact treatment:28273}   If treatment provided at initial evaluation, no treatment charged due to lack of authorization.

## 2024-03-11 ENCOUNTER — Ambulatory Visit (HOSPITAL_COMMUNITY)

## 2024-03-11 ENCOUNTER — Ambulatory Visit: Payer: Self-pay | Admitting: Internal Medicine

## 2024-03-11 DIAGNOSIS — M51372 Other intervertebral disc degeneration, lumbosacral region with discogenic back pain and lower extremity pain: Secondary | ICD-10-CM | POA: Diagnosis not present

## 2024-03-12 DIAGNOSIS — M51372 Other intervertebral disc degeneration, lumbosacral region with discogenic back pain and lower extremity pain: Secondary | ICD-10-CM | POA: Diagnosis not present

## 2024-03-13 DIAGNOSIS — M51372 Other intervertebral disc degeneration, lumbosacral region with discogenic back pain and lower extremity pain: Secondary | ICD-10-CM | POA: Diagnosis not present

## 2024-03-14 DIAGNOSIS — M51372 Other intervertebral disc degeneration, lumbosacral region with discogenic back pain and lower extremity pain: Secondary | ICD-10-CM | POA: Diagnosis not present

## 2024-03-15 DIAGNOSIS — M51372 Other intervertebral disc degeneration, lumbosacral region with discogenic back pain and lower extremity pain: Secondary | ICD-10-CM | POA: Diagnosis not present

## 2024-03-16 DIAGNOSIS — M51372 Other intervertebral disc degeneration, lumbosacral region with discogenic back pain and lower extremity pain: Secondary | ICD-10-CM | POA: Diagnosis not present

## 2024-03-17 ENCOUNTER — Encounter: Payer: Self-pay | Admitting: Internal Medicine

## 2024-03-17 DIAGNOSIS — M51372 Other intervertebral disc degeneration, lumbosacral region with discogenic back pain and lower extremity pain: Secondary | ICD-10-CM | POA: Diagnosis not present

## 2024-03-18 DIAGNOSIS — M51372 Other intervertebral disc degeneration, lumbosacral region with discogenic back pain and lower extremity pain: Secondary | ICD-10-CM | POA: Diagnosis not present

## 2024-03-19 DIAGNOSIS — M51372 Other intervertebral disc degeneration, lumbosacral region with discogenic back pain and lower extremity pain: Secondary | ICD-10-CM | POA: Diagnosis not present

## 2024-03-20 DIAGNOSIS — M51372 Other intervertebral disc degeneration, lumbosacral region with discogenic back pain and lower extremity pain: Secondary | ICD-10-CM | POA: Diagnosis not present

## 2024-03-21 DIAGNOSIS — M51372 Other intervertebral disc degeneration, lumbosacral region with discogenic back pain and lower extremity pain: Secondary | ICD-10-CM | POA: Diagnosis not present

## 2024-03-22 DIAGNOSIS — M51372 Other intervertebral disc degeneration, lumbosacral region with discogenic back pain and lower extremity pain: Secondary | ICD-10-CM | POA: Diagnosis not present

## 2024-03-23 DIAGNOSIS — M51372 Other intervertebral disc degeneration, lumbosacral region with discogenic back pain and lower extremity pain: Secondary | ICD-10-CM | POA: Diagnosis not present

## 2024-03-24 ENCOUNTER — Encounter (HOSPITAL_COMMUNITY): Payer: Self-pay

## 2024-03-24 ENCOUNTER — Other Ambulatory Visit: Payer: Self-pay

## 2024-03-24 ENCOUNTER — Emergency Department (HOSPITAL_COMMUNITY)
Admission: EM | Admit: 2024-03-24 | Discharge: 2024-03-24 | Disposition: A | Discharge status: Home / Self Care | Attending: Emergency Medicine | Admitting: Emergency Medicine

## 2024-03-24 DIAGNOSIS — M51372 Other intervertebral disc degeneration, lumbosacral region with discogenic back pain and lower extremity pain: Secondary | ICD-10-CM | POA: Diagnosis not present

## 2024-03-24 DIAGNOSIS — T7840XA Allergy, unspecified, initial encounter: Secondary | ICD-10-CM | POA: Diagnosis not present

## 2024-03-24 DIAGNOSIS — Z7951 Long term (current) use of inhaled steroids: Secondary | ICD-10-CM | POA: Diagnosis not present

## 2024-03-24 DIAGNOSIS — I10 Essential (primary) hypertension: Secondary | ICD-10-CM | POA: Diagnosis not present

## 2024-03-24 DIAGNOSIS — T782XXA Anaphylactic shock, unspecified, initial encounter: Secondary | ICD-10-CM | POA: Diagnosis not present

## 2024-03-24 DIAGNOSIS — J4489 Other specified chronic obstructive pulmonary disease: Secondary | ICD-10-CM | POA: Diagnosis not present

## 2024-03-24 DIAGNOSIS — L509 Urticaria, unspecified: Secondary | ICD-10-CM | POA: Insufficient documentation

## 2024-03-24 DIAGNOSIS — Z7901 Long term (current) use of anticoagulants: Secondary | ICD-10-CM | POA: Insufficient documentation

## 2024-03-24 DIAGNOSIS — R069 Unspecified abnormalities of breathing: Secondary | ICD-10-CM | POA: Diagnosis not present

## 2024-03-24 DIAGNOSIS — R1111 Vomiting without nausea: Secondary | ICD-10-CM | POA: Diagnosis not present

## 2024-03-24 LAB — COMPREHENSIVE METABOLIC PANEL WITH GFR
ALT: 13 U/L (ref 0–44)
AST: 22 U/L (ref 15–41)
Albumin: 3.8 g/dL (ref 3.5–5.0)
Alkaline Phosphatase: 73 U/L (ref 38–126)
Anion gap: 6 (ref 5–15)
BUN: 19 mg/dL (ref 6–20)
CO2: 24 mmol/L (ref 22–32)
Calcium: 8.8 mg/dL — ABNORMAL LOW (ref 8.9–10.3)
Chloride: 106 mmol/L (ref 98–111)
Creatinine, Ser: 1.1 mg/dL (ref 0.61–1.24)
GFR, Estimated: >60 mL/min (ref >60)
Glucose, Bld: 107 mg/dL — ABNORMAL HIGH (ref 70–99)
Potassium: 3.7 mmol/L (ref 3.5–5.1)
Sodium: 136 mmol/L (ref 135–145)
Total Bilirubin: 1 mg/dL (ref 0.0–1.2)
Total Protein: 6.6 g/dL (ref 6.5–8.1)

## 2024-03-24 LAB — CBC WITH DIFFERENTIAL/PLATELET
Abs Immature Granulocytes: 0.06 10*3/uL (ref 0.00–0.07)
Basophils Absolute: 0 10*3/uL (ref 0.0–0.1)
Basophils Relative: 0 %
Eosinophils Absolute: 0.1 10*3/uL (ref 0.0–0.5)
Eosinophils Relative: 1 %
HCT: 46.8 % (ref 39.0–52.0)
Hemoglobin: 16.9 g/dL (ref 13.0–17.0)
Immature Granulocytes: 0 %
Lymphocytes Relative: 11 %
Lymphs Abs: 1.9 10*3/uL (ref 0.7–4.0)
MCH: 34.2 pg — ABNORMAL HIGH (ref 26.0–34.0)
MCHC: 36.1 g/dL — ABNORMAL HIGH (ref 30.0–36.0)
MCV: 94.7 fL (ref 80.0–100.0)
Monocytes Absolute: 1 10*3/uL (ref 0.1–1.0)
Monocytes Relative: 6 %
Neutro Abs: 14.8 10*3/uL — ABNORMAL HIGH (ref 1.7–7.7)
Neutrophils Relative %: 82 %
Platelets: 297 10*3/uL (ref 150–400)
RBC: 4.94 MIL/uL (ref 4.22–5.81)
RDW: 11.8 % (ref 11.5–15.5)
WBC: 18 10*3/uL — ABNORMAL HIGH (ref 4.0–10.5)
nRBC: 0 % (ref 0.0–0.2)

## 2024-03-24 LAB — LIPASE, BLOOD: Lipase: 27 U/L (ref 11–51)

## 2024-03-24 MED ORDER — SODIUM CHLORIDE 0.9 % IV BOLUS
1000.0000 mL | Freq: Once | INTRAVENOUS | Status: AC
  Administered 2024-03-24: 1000 mL via INTRAVENOUS

## 2024-03-24 MED ORDER — DIPHENHYDRAMINE HCL 50 MG/ML IJ SOLN
25.0000 mg | Freq: Once | INTRAMUSCULAR | Status: AC
  Administered 2024-03-24: 25 mg via INTRAVENOUS
  Filled 2024-03-24: qty 1

## 2024-03-24 MED ORDER — DEXAMETHASONE 4 MG PO TABS
8.0000 mg | ORAL_TABLET | Freq: Once | ORAL | Status: AC
  Administered 2024-03-24: 8 mg via ORAL
  Filled 2024-03-24: qty 2

## 2024-03-24 MED ORDER — PROMETHAZINE HCL 12.5 MG PO TABS
25.0000 mg | ORAL_TABLET | Freq: Once | ORAL | Status: AC
  Administered 2024-03-24: 25 mg via ORAL
  Filled 2024-03-24: qty 2

## 2024-03-24 MED ORDER — EPINEPHRINE 0.3 MG/0.3ML IJ SOAJ: 0.3000 mg | INTRAMUSCULAR | 1 refills | Status: AC | PRN

## 2024-03-24 NOTE — ED Triage Notes (Addendum)
 BIB EMS. Pt is A&Ox4 and ambulatory. Pt stated, "this started last night and got worse. I feel like I am about to past out. I had a cheeseburger which I don't normally eat and have new detergent."   Pt took 75mg  of benadryl  and his Epi pen. Upper, lower extremities, and abdomen are covered with red swollen bumps. Denies itchiness or difficulty swallowing at this time.

## 2024-03-24 NOTE — ED Notes (Signed)
 Still unable to reach pt's spouse. Spouse has been called 2x.

## 2024-03-24 NOTE — ED Provider Notes (Signed)
 [place] EMERGENCY DEPARTMENT AT [facility] Provider Note   CSN: [id number] Arrival date & time: 03/24/24  0144     History  Chief Complaint  Patient presents with   Urticaria    Lance Cohen is a 53 y.o. male.  The history is provided by the patient.  Urticaria This is a new problem. The current episode started 6 to 12 hours ago. The problem occurs constantly. The problem has not changed since onset.Associated symptoms include shortness of breath. Treatments tried: epipen .  Patient w/extensive history including chronic pain, COPD, VTE on anticoagulation, hypertension presents for allergic reaction.  Patient reports around 8 PM on June 3 he had a diffuse rash to his body and felt short of breath.  No tongue or lip swelling.  He does report vomiting. He had an EpiPen  at home and took it just prior to arrival.  He also took Benadryl .  He now reports he feels unwell due to the EpiPen .  No chest pain is reported. He has tried following up with allergy but has not been seen as of yet    Past Medical History:  Diagnosis Date   Anxiety    Arthritis    Asthma    Bipolar 1 disorder (HCC)    Bradycardia    Carpal tunnel syndrome 2023   Chronic knee pain    COPD (chronic obstructive pulmonary disease) (HCC)    Depression    Dilated aortic root (HCC)    DVT (deep venous thrombosis) (HCC)    Dysrhythmia    H/o blood clots in right leg 2022   History of kidney stones    Hypertension    Insomnia    Medical history non-contributory    Sinus bradycardia    Spinal stenosis    Spinal stenosis    Spinal stenosis 2022   Suicide attempt (HCC)    2010 Intentional overdose attempt with Depakote.    Home Medications Prior to Admission medications   Medication Sig Start Date End Date Taking? Authorizing Provider  EPINEPHrine  0.3 mg/0.3 mL IJ SOAJ injection Inject 0.3 mg into the muscle as needed for anaphylaxis. 03/24/24  Yes [name], MD  apixaban  (ELIQUIS ) 5 MG  TABS tablet Take 1 tablet (5 mg total) by mouth 2 (two) times daily. 12/15/23   Meldon Sport, MD  gabapentin  (NEURONTIN ) 300 MG capsule Take 1 capsule (300 mg total) by mouth 3 (three) times daily. 02/23/24   Alison Irvine, FNP  methocarbamol  (ROBAXIN ) 500 MG tablet Take 1 tablet (500 mg total) by mouth 2 (two) times daily. 02/15/24   Ninetta Basket, MD  mirtazapine  (REMERON ) 7.5 MG tablet Take 1 tablet (7.5 mg total) by mouth at bedtime. 02/23/24   Alison Irvine, FNP  SYMBICORT  80-4.5 MCG/ACT inhaler Inhale 2 puffs into the lungs 2 (two) times daily. 02/23/24   Alison Irvine, FNP      Allergies    Lovenox  [enoxaparin  sodium], Bee venom, Penicillins, Zofran  [ondansetron ], Other, and Sulfa  antibiotics    Review of Systems   Review of Systems  Respiratory:  Positive for shortness of breath.   Gastrointestinal:  Positive for vomiting.  Skin:  Positive for rash.    Physical Exam Updated Vital Signs BP 134/89   Pulse 75   Temp 97.8 F (36.6 C)   Resp 14   Ht 1.803 m (5\' 11" )   Wt 81.6 kg   SpO2 97%   BMI 25.10 kg/m  Physical Exam CONSTITUTIONAL: Chronically ill-appearing, appears older than  stated age HEAD: Normocephalic/atraumatic EYES: EOMI/PERRL ENMT: Mucous membranes moist, poor dentition, no angioedema, no stridor, no drooling NECK: supple no meningeal signs CV: S1/S2 noted, no murmurs/rubs/gallops noted LUNGS: Lungs are clear to auscultation bilaterally, no apparent distress ABDOMEN: soft NEURO: Pt is awake/alert/appropriate, moves all extremitiesx4.  No facial droop.   SKIN: Diffuse urticaria to extremities and torso PSYCH: Anxious  ED Results / Procedures / Treatments   Labs (all labs ordered are listed, but only abnormal results are displayed) Labs Reviewed - No data to display  EKG EKG Interpretation Date/Time:  Wednesday March 24 2024 02:05:20 EDT Ventricular Rate:  78 PR Interval:  152 QRS Duration:  96 QT Interval:  387 QTC Calculation: 441 R  Axis:   -40  Text Interpretation: Sinus rhythm Left axis deviation Low voltage, extremity leads Anteroseptal infarct, age indeterminate Confirmed by [name] ([id number]) on 03/24/2024 2:09:28 AM  Radiology No results found.  Procedures Procedures    Medications Ordered in ED Medications  promethazine  (PHENERGAN ) tablet 25 mg (has no administration in time range)  dexamethasone  (DECADRON ) tablet 8 mg (8 mg Oral Given 03/24/24 0325)    ED Course/ Medical Decision Making/ A&P Clinical Course as of 03/24/24 0407  Wed Mar 24, 2024  0215 Patient presents for anaphylaxis episode, already took Benadryl  at home No angioedema, no stridor, no hypotension.  He reports feeling unwell after EpiPen , but his EKG is unremarkable and he is not vomiting Chest pain is reported  Will give a dose of Decadron .  Will monitor in the ER [DW]  0306 Patient resting comfortably, no acute distress, will continue to monitor [DW]    Clinical Course User Index [DW] [name], MD                                 Medical Decision Making Risk Prescription drug management.   Patient monitored for several hours that any deterioration He is resting comfortably no acute distress.  No angioedema, no stridor, no drooling His rash is improved but not resolved He has had no rebound symptoms.  He is safe for discharge.  EpiPen  has been sent to his pharmacy and he was instructed to pick it up today Referral to allergy has been placed He reports mild nausea but no vomiting, promethazine  has been ordered due to Zofran  allergy        Final Clinical Impression(s) / ED Diagnoses Final diagnoses:  Urticaria    Rx / DC Orders ED Discharge Orders          Ordered    Ambulatory referral to Allergy        03/24/24 0210    EPINEPHrine  0.3 mg/0.3 mL IJ SOAJ injection  As needed        03/24/24 0210              [name], MD 03/24/24 0407

## 2024-03-24 NOTE — ED Notes (Addendum)
 Emesis episodes have stopped. Pt is in and out of sleep at this time.

## 2024-03-24 NOTE — ED Notes (Signed)
 Pt requested this nurse to call spouse Maryann Ospina to come and pick him up. Spouse was called 3x no answer. Unable to leave voicemail because of full mailbox.

## 2024-03-24 NOTE — Discharge Instructions (Addendum)
 Please pick up the EpiPen  at the pharmacy and have this with you.  If you have any tongue swelling lip swelling difficulty breathing or swallowing in the next 2 days please use the EpiPen  and call 911  Call the number for the allergy center in [place] in the next week.  A referral has been placed for you to be seen for your frequent allergic reactions

## 2024-03-24 NOTE — ED Notes (Signed)
 Pt is asleep

## 2024-03-24 NOTE — ED Notes (Signed)
 Pt actively having multiple emesis episodes and complaining of stomach pain. EDP notified.

## 2024-03-24 NOTE — ED Provider Notes (Signed)
 Patient is now improved.  He is taking oral fluids. His rash is resolving.  No angioedema, no stridor His abdomen is soft and nontender. No wheezing on exam.  Patient be discharged home   [name], MD 03/24/24 [phone]

## 2024-03-24 NOTE — ED Provider Notes (Signed)
 Patient now vomiting after meds Will place IV, check labs and reassess   [name], MD 03/24/24 [phone]

## 2024-03-25 DIAGNOSIS — M51372 Other intervertebral disc degeneration, lumbosacral region with discogenic back pain and lower extremity pain: Secondary | ICD-10-CM | POA: Diagnosis not present

## 2024-03-26 DIAGNOSIS — M51372 Other intervertebral disc degeneration, lumbosacral region with discogenic back pain and lower extremity pain: Secondary | ICD-10-CM | POA: Diagnosis not present

## 2024-03-27 DIAGNOSIS — M51372 Other intervertebral disc degeneration, lumbosacral region with discogenic back pain and lower extremity pain: Secondary | ICD-10-CM | POA: Diagnosis not present

## 2024-03-28 DIAGNOSIS — M51372 Other intervertebral disc degeneration, lumbosacral region with discogenic back pain and lower extremity pain: Secondary | ICD-10-CM | POA: Diagnosis not present

## 2024-03-29 ENCOUNTER — Ambulatory Visit (HOSPITAL_COMMUNITY)

## 2024-03-29 ENCOUNTER — Encounter (HOSPITAL_COMMUNITY): Payer: Self-pay

## 2024-03-29 DIAGNOSIS — M51372 Other intervertebral disc degeneration, lumbosacral region with discogenic back pain and lower extremity pain: Secondary | ICD-10-CM | POA: Diagnosis not present

## 2024-03-29 NOTE — Therapy (Signed)
 [facility] Outpatient Rehabilitation at [facility] [address] [place], [place], [address] Phone: [phone]   Fax:  [phone]  Patient Details  Name: Lance Cohen MRN: [id number] Date of Birth: 1971/05/07 Referring Provider:  No ref. provider found  Encounter Date: 03/29/2024  Called pt regarding no show for evaluation. Pt's spouse answered and gave the phone to pt. Pt reporting that he was unable to come due to pain. Pt reporting he has 3 blood clots in his leg, pt then asked to call back to talk later. Pt reporting that he wanted to reschedule. Will await call back.   Gatha Kaska, PT 03/29/2024, 9:56 AM  [facility] Outpatient Rehabilitation at [facility] [address] [place], [place], [address] Phone: [phone]   Fax:  [phone]

## 2024-03-29 NOTE — Therapy (Incomplete)
 OUTPATIENT PHYSICAL THERAPY CERVICAL EVALUATION   Patient Name: Lance Cohen MRN: [id number] DOB:October 13, 1971, 53 y.o., male Today's Date: 03/29/2024  END OF SESSION:   Past Medical History:  Diagnosis Date   Anxiety    Arthritis    Asthma    Bipolar 1 disorder (HCC)    Bradycardia    Carpal tunnel syndrome 2023   Chronic knee pain    COPD (chronic obstructive pulmonary disease) (HCC)    Depression    Dilated aortic root (HCC)    DVT (deep venous thrombosis) (HCC)    Dysrhythmia    H/o blood clots in right leg 2022   History of kidney stones    Hypertension    Insomnia    Medical history non-contributory    Sinus bradycardia    Spinal stenosis    Spinal stenosis    Spinal stenosis 2022   Suicide attempt (HCC)    2010 Intentional overdose attempt with Depakote.   Past Surgical History:  Procedure Laterality Date   COLONOSCOPY WITH PROPOFOL  N/A 03/18/2019   Procedure: COLONOSCOPY WITH PROPOFOL ;  Surgeon: Suzette Espy, MD;  Location: [facility];  Service: Endoscopy;  Laterality: N/A;  2:30pm   FOOT SURGERY     had peice of wire removed   IVC FILTER REMOVAL N/A 02/24/2017   Procedure: IVC Filter Removal;  Surgeon: Celso College, MD;  Location: [facility] INVASIVE CV LAB;  Service: Cardiovascular;  Laterality: N/A;   MULTIPLE TOOTH EXTRACTIONS     ORIF RADIAL FRACTURE Right 12/05/2020   Procedure: OPEN REDUCTION INTERNAL FIXATION (ORIF) RADIAL FRACTURE and ULNA FRACTURE;  Surgeon: Darrin Emerald, MD;  Location: [facility];  Service: Orthopedics;  Laterality: Right;   PERIPHERAL VASCULAR CATHETERIZATION Right 10/22/2016   Procedure: Thrombectomy, thrombolyisis;  Surgeon: Jackquelyn Mass, MD;  Location: [facility] INVASIVE CV LAB;  Service: Cardiovascular;  Laterality: Right;   PERIPHERAL VASCULAR CATHETERIZATION N/A 10/22/2016   Procedure: IVC Filter Insertion;  Surgeon: Jackquelyn Mass, MD;  Location: [facility] INVASIVE CV LAB;  Service: Cardiovascular;  Laterality: N/A;    POLYPECTOMY  03/18/2019   Procedure: POLYPECTOMY;  Surgeon: Suzette Espy, MD;  Location: [facility];  Service: Endoscopy;;   TOOTH EXTRACTION N/A 01/02/2024   Procedure: EXTRACTION TEETH NUMBER THREE, FOUR, FIVE, SIX, TEEN, ELEVEN, TWELVE, THIRTEEN, FOURTEEN, THIRTY ONE, ALVEOPLASTY;  Surgeon: Ascencion Lava, DMD;  Location: [facility];  Service: Oral Surgery;  Laterality: N/A;   Patient Active Problem List   Diagnosis Date Noted   Insomnia due to other mental disorder 02/24/2024   Degeneration of lumbosacral intervertebral disc 11/12/2023   Chronic right shoulder pain 10/13/2023   Hemorrhoids 08/14/2023   Encounter for general adult medical examination with abnormal findings 08/14/2023   Mixed hyperlipidemia 08/14/2023   Lumbosacral radiculopathy 08/14/2023   Polysubstance abuse (HCC) 08/14/2023   Seizure (HCC) 11/27/2020   Myofascial pain dysfunction syndrome 11/22/2020   Cocaine abuse (HCC) 11/18/2020   Foreign body (FB) in soft tissue 09/08/2020   Chronic low back pain 05/11/2020   Cervical radiculopathy 02/18/2020   Impingement syndrome of right shoulder 01/18/2020   GERD (gastroesophageal reflux disease) 06/29/2019   Rectal bleeding 02/22/2019   Chest pain 10/19/2017   Asthma 10/19/2017   Hypertension 10/19/2017   Arthralgia of right knee 10/19/2017   History of pulmonary embolus (PE) 10/23/2016   Nausea and vomiting 10/23/2016   S/P IVC filter 10/23/2016   Chronic deep vein thrombosis (DVT) (HCC) 10/18/2016   Bipolar 1 disorder (HCC) 06/24/2011  Tobacco abuse 06/24/2011   CARPAL TUNNEL SYNDROME, RIGHT 06/21/2010    PCP: Meldon Sport, MD  REFERRING PROVIDER: Diedra Fowler, MD  REFERRING DIAG: [id number] (ICD-10-CM) - Radiculopathy, cervical region   THERAPY DIAG:  No diagnosis found.  Rationale for Evaluation and Treatment: Rehabilitation  ONSET DATE: ***  SUBJECTIVE:                                                                                                                                                                                                          SUBJECTIVE STATEMENT: *** Hand dominance: {MISC; OT HAND DOMINANCE:[phone]}  PERTINENT HISTORY:  ***  PAIN:  Are you having pain? Yes: NPRS scale: *** Pain location: *** Pain description: *** Aggravating factors: *** Relieving factors: ***  PRECAUTIONS: {Therapy precautions:24002}  RED FLAGS: {PT Red Flags:29287}     WEIGHT BEARING RESTRICTIONS: {Yes ***/No:24003}  FALLS:  Has patient fallen in last 6 months? {fallsyesno:27318}   PATIENT GOALS: ***  NEXT MD VISIT: ***  OBJECTIVE:  Note: Objective measures were completed at Evaluation unless otherwise noted.  DIAGNOSTIC FINDINGS:  ***  PATIENT SURVEYS:  NDI ***  COGNITION: Overall cognitive status: {cognition:24006}  SENSATION: {sensation:27233}  POSTURE: {posture:25561}  PALPATION: ***   CERVICAL ROM:   Active ROM A/PROM (deg) eval  Flexion   Extension   Right lateral flexion   Left lateral flexion   Right rotation   Left rotation    (Blank rows = not tested)  UPPER EXTREMITY ROM:  Active ROM Right eval Left eval  Shoulder flexion    Shoulder extension    Shoulder abduction    Shoulder adduction    Shoulder extension    Shoulder internal rotation    Shoulder external rotation    Elbow flexion    Elbow extension    Wrist flexion    Wrist extension    Wrist ulnar deviation    Wrist radial deviation    Wrist pronation    Wrist supination     (Blank rows = not tested)  UPPER EXTREMITY MMT:  MMT Right eval Left eval  Shoulder flexion    Shoulder extension    Shoulder abduction    Shoulder adduction    Shoulder extension    Shoulder internal rotation    Shoulder external rotation    Middle trapezius    Lower trapezius    Elbow flexion    Elbow extension    Wrist flexion    Wrist extension    Wrist ulnar deviation    Wrist radial deviation  Wrist pronation     Wrist supination    Grip strength     (Blank rows = not tested)  CERVICAL SPECIAL TESTS:  {Cervical special tests:25246}  FUNCTIONAL TESTS:  {Functional tests:24029}  TREATMENT DATE: ***                                                                                                                                 PATIENT EDUCATION:  Education details: PT Evaluation, findings, prognosis, frequency, attendance policy, and ***. Person educated: Patient Education method: Medical illustrator Education comprehension: verbalized understanding  HOME EXERCISE PROGRAM: ***  ASSESSMENT:  CLINICAL IMPRESSION: Patient is a *** y.o. *** who was seen today for physical therapy evaluation and treatment for M54.12 (ICD-10-CM) - Radiculopathy, cervical region. ***. Pt will benefit from skilled Physical Therapy services to address deficits/limitations in order to improve functional and QOL.    OBJECTIVE IMPAIRMENTS: {opptimpairments:25111}.   ACTIVITY LIMITATIONS: {activitylimitations:27494}  PARTICIPATION LIMITATIONS: {participationrestrictions:25113}  PERSONAL FACTORS: {Personal factors:25162} are also affecting patient's functional outcome.   REHAB POTENTIAL: {rehabpotential:25112}  CLINICAL DECISION MAKING: {clinical decision making:25114}  EVALUATION COMPLEXITY: {Evaluation complexity:25115}   GOALS: Goals reviewed with patient? {yes/no:20286}  SHORT TERM GOALS: Target date: ***  Pt will be independent with HEP in order to demonstrate participation in Physical Therapy POC.  Baseline: Goal status: {GOALSTATUS:25110}  2.  Pt will report ***/10 pain scale during UB ADLs in order to reduce pain and to optimize function.  Baseline:  Goal status: {GOALSTATUS:25110}  LONG TERM GOALS: Target date: ***  Pt will improve Deep Neck Flexor Endurance test by *** in order to demonstrate improved functional strength to return to desired activities.  Baseline: see objective.   Goal status: {GOALSTATUS:25110}  2.  Pt will improve Cervical ROM by *** in order to improve functional mobility during ADLs.  Baseline: see objective.  Goal status: {GOALSTATUS:25110}  3.  Pt will improve NDI score by *** in order to demonstrate improved pain with functional goals and outcomes. Baseline: see objective.  Goal status: {GOALSTATUS:25110}  4.  Pt will report ***/10 pain scale during UB ADLs in order to reduce pain and to optimize function lasting greater than > 30 minutes.  Baseline: see objective.  Goal status: {GOALSTATUS:25110}  5.  Pt will *** Baseline: see objective.  Goal status: {GOALSTATUS:25110}   PLAN:  PT FREQUENCY: {rehab frequency:25116}  PT DURATION: {rehab duration:25117}  PLANNED INTERVENTIONS: {rehab planned interventions:25118::"97110-Therapeutic exercises","97530- Therapeutic [phone]- Neuromuscular re-education","97535- Self GMWN","02725- Manual therapy"}  PLAN FOR NEXT SESSION: ***   Gatha Kaska, PT 03/29/2024, 8:25 AM

## 2024-03-30 DIAGNOSIS — M51372 Other intervertebral disc degeneration, lumbosacral region with discogenic back pain and lower extremity pain: Secondary | ICD-10-CM | POA: Diagnosis not present

## 2024-03-31 DIAGNOSIS — M51372 Other intervertebral disc degeneration, lumbosacral region with discogenic back pain and lower extremity pain: Secondary | ICD-10-CM | POA: Diagnosis not present

## 2024-04-01 DIAGNOSIS — Z419 Encounter for procedure for purposes other than remedying health state, unspecified: Secondary | ICD-10-CM | POA: Diagnosis not present

## 2024-04-01 DIAGNOSIS — M51372 Other intervertebral disc degeneration, lumbosacral region with discogenic back pain and lower extremity pain: Secondary | ICD-10-CM | POA: Diagnosis not present

## 2024-04-02 DIAGNOSIS — M51372 Other intervertebral disc degeneration, lumbosacral region with discogenic back pain and lower extremity pain: Secondary | ICD-10-CM | POA: Diagnosis not present

## 2024-04-03 DIAGNOSIS — M51372 Other intervertebral disc degeneration, lumbosacral region with discogenic back pain and lower extremity pain: Secondary | ICD-10-CM | POA: Diagnosis not present

## 2024-04-04 DIAGNOSIS — M51372 Other intervertebral disc degeneration, lumbosacral region with discogenic back pain and lower extremity pain: Secondary | ICD-10-CM | POA: Diagnosis not present

## 2024-04-05 DIAGNOSIS — M51372 Other intervertebral disc degeneration, lumbosacral region with discogenic back pain and lower extremity pain: Secondary | ICD-10-CM | POA: Diagnosis not present

## 2024-04-06 DIAGNOSIS — M51372 Other intervertebral disc degeneration, lumbosacral region with discogenic back pain and lower extremity pain: Secondary | ICD-10-CM | POA: Diagnosis not present

## 2024-04-07 DIAGNOSIS — M51372 Other intervertebral disc degeneration, lumbosacral region with discogenic back pain and lower extremity pain: Secondary | ICD-10-CM | POA: Diagnosis not present

## 2024-04-08 DIAGNOSIS — M51372 Other intervertebral disc degeneration, lumbosacral region with discogenic back pain and lower extremity pain: Secondary | ICD-10-CM | POA: Diagnosis not present

## 2024-04-09 DIAGNOSIS — M51372 Other intervertebral disc degeneration, lumbosacral region with discogenic back pain and lower extremity pain: Secondary | ICD-10-CM | POA: Diagnosis not present

## 2024-04-10 DIAGNOSIS — M51372 Other intervertebral disc degeneration, lumbosacral region with discogenic back pain and lower extremity pain: Secondary | ICD-10-CM | POA: Diagnosis not present

## 2024-04-11 ENCOUNTER — Emergency Department (HOSPITAL_COMMUNITY)
Admission: EM | Admit: 2024-04-11 | Discharge: 2024-04-12 | Disposition: A | Discharge status: Home / Self Care | Attending: Emergency Medicine | Admitting: Emergency Medicine

## 2024-04-11 ENCOUNTER — Other Ambulatory Visit: Payer: Self-pay

## 2024-04-11 ENCOUNTER — Encounter (HOSPITAL_COMMUNITY): Payer: Self-pay | Admitting: Emergency Medicine

## 2024-04-11 DIAGNOSIS — E876 Hypokalemia: Secondary | ICD-10-CM | POA: Diagnosis not present

## 2024-04-11 DIAGNOSIS — T782XXA Anaphylactic shock, unspecified, initial encounter: Secondary | ICD-10-CM | POA: Diagnosis not present

## 2024-04-11 DIAGNOSIS — L299 Pruritus, unspecified: Secondary | ICD-10-CM | POA: Diagnosis not present

## 2024-04-11 DIAGNOSIS — Z7901 Long term (current) use of anticoagulants: Secondary | ICD-10-CM | POA: Diagnosis not present

## 2024-04-11 DIAGNOSIS — D72829 Elevated white blood cell count, unspecified: Secondary | ICD-10-CM | POA: Diagnosis not present

## 2024-04-11 DIAGNOSIS — T7840XA Allergy, unspecified, initial encounter: Secondary | ICD-10-CM | POA: Diagnosis not present

## 2024-04-11 DIAGNOSIS — J4489 Other specified chronic obstructive pulmonary disease: Secondary | ICD-10-CM | POA: Diagnosis not present

## 2024-04-11 DIAGNOSIS — M51372 Other intervertebral disc degeneration, lumbosacral region with discogenic back pain and lower extremity pain: Secondary | ICD-10-CM | POA: Diagnosis not present

## 2024-04-11 DIAGNOSIS — I10 Essential (primary) hypertension: Secondary | ICD-10-CM | POA: Diagnosis not present

## 2024-04-11 DIAGNOSIS — Z87442 Personal history of urinary calculi: Secondary | ICD-10-CM | POA: Diagnosis not present

## 2024-04-11 DIAGNOSIS — Z7951 Long term (current) use of inhaled steroids: Secondary | ICD-10-CM | POA: Diagnosis not present

## 2024-04-11 DIAGNOSIS — L509 Urticaria, unspecified: Secondary | ICD-10-CM | POA: Diagnosis not present

## 2024-04-11 DIAGNOSIS — Z743 Need for continuous supervision: Secondary | ICD-10-CM | POA: Diagnosis not present

## 2024-04-11 LAB — BASIC METABOLIC PANEL WITH GFR
Anion gap: 10 (ref 5–15)
BUN: 11 mg/dL (ref 6–20)
CO2: 25 mmol/L (ref 22–32)
Calcium: 8.9 mg/dL (ref 8.9–10.3)
Chloride: 104 mmol/L (ref 98–111)
Creatinine, Ser: 1.18 mg/dL (ref 0.61–1.24)
GFR, Estimated: >60 mL/min (ref >60)
Glucose, Bld: 161 mg/dL — ABNORMAL HIGH (ref 70–99)
Potassium: 3.1 mmol/L — ABNORMAL LOW (ref 3.5–5.1)
Sodium: 139 mmol/L (ref 135–145)

## 2024-04-11 LAB — CBC
HCT: 46.6 % (ref 39.0–52.0)
Hemoglobin: 16.7 g/dL (ref 13.0–17.0)
MCH: 33.7 pg (ref 26.0–34.0)
MCHC: 35.8 g/dL (ref 30.0–36.0)
MCV: 94 fL (ref 80.0–100.0)
Platelets: 343 10*3/uL (ref 150–400)
RBC: 4.96 MIL/uL (ref 4.22–5.81)
RDW: 11.5 % (ref 11.5–15.5)
WBC: 13.2 10*3/uL — ABNORMAL HIGH (ref 4.0–10.5)
nRBC: 0 % (ref 0.0–0.2)

## 2024-04-11 LAB — MAGNESIUM: Magnesium: 1.8 mg/dL (ref 1.7–2.4)

## 2024-04-11 MED ORDER — METHYLPREDNISOLONE SODIUM SUCC 125 MG IJ SOLR
125.0000 mg | Freq: Once | INTRAMUSCULAR | Status: AC
  Administered 2024-04-11: 125 mg via INTRAVENOUS
  Filled 2024-04-11: qty 2

## 2024-04-11 MED ORDER — IPRATROPIUM-ALBUTEROL 0.5-2.5 (3) MG/3ML IN SOLN
3.0000 mL | Freq: Once | RESPIRATORY_TRACT | Status: AC
  Administered 2024-04-11: 3 mL via RESPIRATORY_TRACT
  Filled 2024-04-11: qty 3

## 2024-04-11 MED ORDER — HYDROXYZINE HCL 25 MG PO TABS
50.0000 mg | ORAL_TABLET | Freq: Once | ORAL | Status: AC
  Administered 2024-04-11: 50 mg via ORAL
  Filled 2024-04-11: qty 2

## 2024-04-11 MED ORDER — SODIUM CHLORIDE 0.9 % IV BOLUS
1000.0000 mL | Freq: Once | INTRAVENOUS | Status: AC
  Administered 2024-04-11: 1000 mL via INTRAVENOUS

## 2024-04-11 MED ORDER — POTASSIUM CHLORIDE CRYS ER 20 MEQ PO TBCR
40.0000 meq | EXTENDED_RELEASE_TABLET | Freq: Once | ORAL | Status: AC
  Administered 2024-04-11: 40 meq via ORAL
  Filled 2024-04-11: qty 2

## 2024-04-11 MED ORDER — FAMOTIDINE IN NACL 20-0.9 MG/50ML-% IV SOLN
20.0000 mg | Freq: Once | INTRAVENOUS | Status: AC
  Administered 2024-04-11: 20 mg via INTRAVENOUS
  Filled 2024-04-11: qty 50

## 2024-04-11 MED ORDER — EPINEPHRINE 0.3 MG/0.3ML IJ SOAJ
0.3000 mg | Freq: Once | INTRAMUSCULAR | Status: AC
  Administered 2024-04-11: 0.3 mg via INTRAMUSCULAR
  Filled 2024-04-11: qty 0.3

## 2024-04-11 NOTE — ED Provider Notes (Signed)
  Provider Note MRN:  [id number]  Arrival date & time: 04/12/24    ED Course and Medical Decision Making  Assumed care of patient at sign-out or upon transfer.  Anaphylaxis, unclear trigger will need observation after epinephrine  until 2 AM.  Doing much better.  2 AM update: Resting comfortably, wakes easily, no acute distress.  Appropriate for discharge.  Procedures  Final Clinical Impressions(s) / ED Diagnoses     ICD-10-CM   1. Anaphylaxis, initial encounter  T78.2XXA     2. Hypokalemia  E87.6       ED Discharge Orders          Ordered    predniSONE  (DELTASONE ) 10 MG tablet        04/12/24 0223              Discharge Instructions      You were evaluated in the Emergency Department and after careful evaluation, we did not find any emergent condition requiring admission or further testing in the hospital.  Your exam/testing today is overall reassuring.  Recommend follow-up with your allergy specialist.  Can continue Benadryl  at home, also recommend use of the steroids as prescribed.  Use your EpiPen  at home only for severe reactions as we discussed.  Please return to the Emergency Department if you experience any worsening of your condition.   Thank you for allowing us  to be a part of your care.      Ozell HERO. Theadore, MD [facility] Emergency Medicine [facility] [email or web address]    Theadore Ozell HERO, MD 04/12/24 [phone]

## 2024-04-11 NOTE — ED Triage Notes (Signed)
 Pt c/o itching that started in groin. He took 2 benadryl  at home. Pt has hives all over.

## 2024-04-11 NOTE — ED Provider Notes (Signed)
 [facility] EMERGENC[facility] DEPARTMENT AT [facility] Provider Note   CSN: [id number] Arrival date & time: 04/11/24  2156     Patient presents with: Allergic Reaction (s)   Lance Cohen is a 53 y.o. male.   Pt is a 53 yo male with pmhx significant for bipolar d/o, ashtma, htn, dvt (on eliquis ), kidney stones, copd, spinal stenosis, and anaphylaxis.  Pt said he put on some new pants and thinks there was an insect in them and it stung him.  He developed hives all over his body.  He has sob and feels dizzy.  He did take 50 mg benadryl  at home without improvement in sx.  He called EMS who did not give him anything en route.  He does have an epi pen, but did not use it because he thinks he gives it to himself incorrectly.       Prior to Admission medications   Medication Sig Start Date End Date Taking? Authorizing Provider  apixaban  (ELIQUIS ) 5 MG TABS tablet Take 1 tablet (5 mg total) by mouth 2 (two) times daily. 12/15/23   Tobie Suzzane POUR, MD  EPINEPHrine  0.3 mg/0.3 mL IJ SOAJ injection Inject 0.3 mg into the muscle as needed for anaphylaxis. 03/24/24   Midge Golas, MD  gabapentin  (NEURONTIN ) 300 MG capsule Take 1 capsule (300 mg total) by mouth 3 (three) times daily. 02/23/24   Bevely Doffing, FNP  methocarbamol  (ROBAXIN ) 500 MG tablet Take 1 tablet (500 mg total) by mouth 2 (two) times daily. 02/15/24   Yolande Lamar BROCKS, MD  mirtazapine  (REMERON ) 7.5 MG tablet Take 1 tablet (7.5 mg total) by mouth at bedtime. 02/23/24   Bevely Doffing, FNP  SYMBICORT  80-4.5 MCG/ACT inhaler Inhale 2 puffs into the lungs 2 (two) times daily. 02/23/24   Bevely Doffing, FNP    Allergies: Lovenox  [enoxaparin  sodium], Bee venom, Penicillins, Zofran  [ondansetron ], Other, and Sulfa  antibiotics    Review of Systems  Respiratory:  Positive for shortness of breath.   Skin:  Positive for rash.  Neurological:  Positive for dizziness.  All other systems reviewed and are negative.   Updated Vital Signs BP  122/80   Pulse 93   Temp 97.9 F (36.6 C) (Oral)   Resp 20   Ht 5' 11 (1.803 m)   Wt 81.6 kg   SpO2 92%   BMI 25.09 kg/m   Physical Exam Vitals and nursing note reviewed.  Constitutional:      Appearance: Normal appearance.  HENT:     Head: Normocephalic and atraumatic.     Right Ear: External ear normal.     Left Ear: External ear normal.     Nose: Nose normal.     Mouth/Throat:     Mouth: Mucous membranes are dry.   Eyes:     Extraocular Movements: Extraocular movements intact.     Conjunctiva/sclera: Conjunctivae normal.     Pupils: Pupils are equal, round, and reactive to light.    Cardiovascular:     Rate and Rhythm: Regular rhythm. Tachycardia present.     Pulses: Normal pulses.     Heart sounds: Normal heart sounds.  Pulmonary:     Effort: Pulmonary effort is normal.     Breath sounds: Wheezing present.  Abdominal:     General: Abdomen is flat.     Palpations: Abdomen is soft.   Musculoskeletal:        General: Normal range of motion.     Cervical back: Normal range of  motion and neck supple.   Skin:    Capillary Refill: Capillary refill takes less than 2 seconds.     Comments: Diffuse urticaria;  pt itching all over    Neurological:     General: No focal deficit present.     Mental Status: He is alert and oriented to person, place, and time.   Psychiatric:        Mood and Affect: Mood normal.        Behavior: Behavior normal.     (all labs ordered are listed, but only abnormal results are displayed) Labs Reviewed  CBC - Abnormal; Notable for the following components:      Result Value   WBC 13.2 (*)    All other components within normal limits  BASIC METABOLIC PANEL WITH GFR - Abnormal; Notable for the following components:   Potassium 3.1 (*)    Glucose, Bld 161 (*)    All other components within normal limits  MAGNESIUM    EKG: None  Radiology: No results found.   Procedures   Medications Ordered in the ED  famotidine   (PEPCID ) IVPB 20 mg premix (20 mg Intravenous New Bag/Given 04/11/24 2242)  hydrOXYzine  (ATARAX ) tablet 50 mg (has no administration in time range)  potassium chloride  SA (KLOR-CON  M) CR tablet 40 mEq (has no administration in time range)  EPINEPHrine  (EPI-PEN) injection 0.3 mg (0.3 mg Intramuscular Given 04/11/24 2209)  methylPREDNISolone  sodium succinate (SOLU-MEDROL ) 125 mg/2 mL injection 125 mg (125 mg Intravenous Given 04/11/24 2238)  sodium chloride  0.9 % bolus 1,000 mL (1,000 mLs Intravenous New Bag/Given 04/11/24 2240)  ipratropium-albuterol  (DUONEB) 0.5-2.5 (3) MG/3ML nebulizer solution 3 mL (3 mLs Nebulization Given 04/11/24 2257)                                    Medical Decision Making Amount and/or Complexity of Data Reviewed Labs: ordered.  Risk Prescription drug management.   This patient presents to the ED for concern of hives, this involves an extensive number of treatment options, and is a complaint that carries with it a high risk of complications and morbidity.  The differential diagnosis includes anaphylaxis, allergic rxn   Co morbidities that complicate the patient evaluation  bipolar d/o, ashtma, htn, dvt (on eliquis ), kidney stones, copd, spinal stenosis, and anaphylaxis   Additional history obtained:  Additional history obtained from epic chart review External records from outside source obtained and reviewed including EMS report   Lab Tests:  I Ordered, and personally interpreted labs.  The pertinent results include:  cbc with wbc elevated at 13.2, bmp with k low at 3.1   Cardiac Monitoring:  The patient was maintained on a cardiac monitor.  I personally viewed and interpreted the cardiac monitored which showed an underlying rhythm of: st initially, now nsr   Medicines ordered and prescription drug management:  I ordered medication including epi, pepcid , solumedrol  for sx  Reevaluation of the patient after these medicines showed that the patient  improved I have reviewed the patients home medicines and have made adjustments as needed   Critical Interventions:  epi   Problem List / ED Course:  Anaphylaxis:  pt given epi, solumedrol, pepcid  in ED.  If sx improve after obs, he should be able to go home.  Pt will be signed out at shift change. Hypokalemia:  kdur given.  Mg level ordered.   Reevaluation:  After the interventions  noted above, I reevaluated the patient and found that they have :improved   Social Determinants of Health:  Lives at home   Dispostion:  Pending at shift change.  Likely d/c.     Final diagnoses:  Anaphylaxis, initial encounter  Hypokalemia    ED Discharge Orders     None          Dean Clarity, MD 04/11/24 2307

## 2024-04-12 DIAGNOSIS — M51372 Other intervertebral disc degeneration, lumbosacral region with discogenic back pain and lower extremity pain: Secondary | ICD-10-CM | POA: Diagnosis not present

## 2024-04-12 MED ORDER — PREDNISONE 10 MG PO TABS: ORAL_TABLET | ORAL | 0 refills | Status: AC

## 2024-04-12 NOTE — Discharge Instructions (Signed)
 You were evaluated in the Emergency Department and after careful evaluation, we did not find any emergent condition requiring admission or further testing in the hospital.  Your exam/testing today is overall reassuring.  Recommend follow-up with your allergy specialist.  Can continue Benadryl  at home, also recommend use of the steroids as prescribed.  Use your EpiPen  at home only for severe reactions as we discussed.  Please return to the Emergency Department if you experience any worsening of your condition.   Thank you for allowing us  to be a part of your care.

## 2024-04-12 NOTE — ED Notes (Signed)
 [facility] has been called for transportation home. ETA 30-61min

## 2024-04-13 ENCOUNTER — Ambulatory Visit: Payer: Medicaid Other | Admitting: Internal Medicine

## 2024-04-13 DIAGNOSIS — S52201S Unspecified fracture of shaft of right ulna, sequela: Secondary | ICD-10-CM | POA: Insufficient documentation

## 2024-04-13 DIAGNOSIS — M51372 Other intervertebral disc degeneration, lumbosacral region with discogenic back pain and lower extremity pain: Secondary | ICD-10-CM | POA: Diagnosis not present

## 2024-04-14 DIAGNOSIS — M51372 Other intervertebral disc degeneration, lumbosacral region with discogenic back pain and lower extremity pain: Secondary | ICD-10-CM | POA: Diagnosis not present

## 2024-04-15 ENCOUNTER — Encounter: Admitting: Orthopedic Surgery

## 2024-04-15 DIAGNOSIS — M51372 Other intervertebral disc degeneration, lumbosacral region with discogenic back pain and lower extremity pain: Secondary | ICD-10-CM | POA: Diagnosis not present

## 2024-04-15 DIAGNOSIS — S52201S Unspecified fracture of shaft of right ulna, sequela: Secondary | ICD-10-CM

## 2024-04-16 DIAGNOSIS — M51372 Other intervertebral disc degeneration, lumbosacral region with discogenic back pain and lower extremity pain: Secondary | ICD-10-CM | POA: Diagnosis not present

## 2024-04-17 DIAGNOSIS — M51372 Other intervertebral disc degeneration, lumbosacral region with discogenic back pain and lower extremity pain: Secondary | ICD-10-CM | POA: Diagnosis not present

## 2024-04-18 DIAGNOSIS — M51372 Other intervertebral disc degeneration, lumbosacral region with discogenic back pain and lower extremity pain: Secondary | ICD-10-CM | POA: Diagnosis not present

## 2024-04-19 ENCOUNTER — Encounter: Payer: Self-pay | Admitting: Internal Medicine

## 2024-04-19 DIAGNOSIS — M51372 Other intervertebral disc degeneration, lumbosacral region with discogenic back pain and lower extremity pain: Secondary | ICD-10-CM | POA: Diagnosis not present

## 2024-04-20 DIAGNOSIS — M51372 Other intervertebral disc degeneration, lumbosacral region with discogenic back pain and lower extremity pain: Secondary | ICD-10-CM | POA: Diagnosis not present

## 2024-04-21 DIAGNOSIS — M51372 Other intervertebral disc degeneration, lumbosacral region with discogenic back pain and lower extremity pain: Secondary | ICD-10-CM | POA: Diagnosis not present

## 2024-04-22 DIAGNOSIS — M51372 Other intervertebral disc degeneration, lumbosacral region with discogenic back pain and lower extremity pain: Secondary | ICD-10-CM | POA: Diagnosis not present

## 2024-04-23 DIAGNOSIS — M51372 Other intervertebral disc degeneration, lumbosacral region with discogenic back pain and lower extremity pain: Secondary | ICD-10-CM | POA: Diagnosis not present

## 2024-04-24 DIAGNOSIS — M51372 Other intervertebral disc degeneration, lumbosacral region with discogenic back pain and lower extremity pain: Secondary | ICD-10-CM | POA: Diagnosis not present

## 2024-04-25 DIAGNOSIS — M51372 Other intervertebral disc degeneration, lumbosacral region with discogenic back pain and lower extremity pain: Secondary | ICD-10-CM | POA: Diagnosis not present

## 2024-04-26 DIAGNOSIS — M51372 Other intervertebral disc degeneration, lumbosacral region with discogenic back pain and lower extremity pain: Secondary | ICD-10-CM | POA: Diagnosis not present

## 2024-04-27 DIAGNOSIS — M51372 Other intervertebral disc degeneration, lumbosacral region with discogenic back pain and lower extremity pain: Secondary | ICD-10-CM | POA: Diagnosis not present

## 2024-04-28 ENCOUNTER — Ambulatory Visit (HOSPITAL_COMMUNITY)

## 2024-04-28 ENCOUNTER — Encounter (HOSPITAL_COMMUNITY): Payer: Self-pay

## 2024-04-28 DIAGNOSIS — M51372 Other intervertebral disc degeneration, lumbosacral region with discogenic back pain and lower extremity pain: Secondary | ICD-10-CM | POA: Diagnosis not present

## 2024-04-28 NOTE — Therapy (Signed)
 [facility] Outpatient Rehabilitation at [facility] [address] [place], [place], [address] Phone: [phone]   Fax:  [phone]  Patient Details  Name: Lance Cohen MRN: [id number] Date of Birth: 1971-06-10 Referring Provider:  No ref. provider found  Encounter Date: 04/28/2024   Called regarding no show for evaluation. This is number #2. Unable to leave voicemail. Will take pt's referral out and will need to get new referral if desires PT.   [name], PT 04/28/2024, 9:57 AM  [facility] Outpatient Rehabilitation at [facility] [address] [place], [place], [address] Phone: [phone]   Fax:  [phone]

## 2024-04-29 DIAGNOSIS — M51372 Other intervertebral disc degeneration, lumbosacral region with discogenic back pain and lower extremity pain: Secondary | ICD-10-CM | POA: Diagnosis not present

## 2024-04-30 DIAGNOSIS — M51372 Other intervertebral disc degeneration, lumbosacral region with discogenic back pain and lower extremity pain: Secondary | ICD-10-CM | POA: Diagnosis not present

## 2024-05-01 DIAGNOSIS — M51372 Other intervertebral disc degeneration, lumbosacral region with discogenic back pain and lower extremity pain: Secondary | ICD-10-CM | POA: Diagnosis not present

## 2024-05-01 DIAGNOSIS — Z419 Encounter for procedure for purposes other than remedying health state, unspecified: Secondary | ICD-10-CM | POA: Diagnosis not present

## 2024-05-02 DIAGNOSIS — M51372 Other intervertebral disc degeneration, lumbosacral region with discogenic back pain and lower extremity pain: Secondary | ICD-10-CM | POA: Diagnosis not present

## 2024-05-03 DIAGNOSIS — M51372 Other intervertebral disc degeneration, lumbosacral region with discogenic back pain and lower extremity pain: Secondary | ICD-10-CM | POA: Diagnosis not present

## 2024-05-04 DIAGNOSIS — M51372 Other intervertebral disc degeneration, lumbosacral region with discogenic back pain and lower extremity pain: Secondary | ICD-10-CM | POA: Diagnosis not present

## 2024-05-05 ENCOUNTER — Telehealth: Payer: Self-pay

## 2024-05-05 ENCOUNTER — Encounter: Payer: Self-pay | Admitting: Internal Medicine

## 2024-05-05 DIAGNOSIS — M51372 Other intervertebral disc degeneration, lumbosacral region with discogenic back pain and lower extremity pain: Secondary | ICD-10-CM | POA: Diagnosis not present

## 2024-05-05 NOTE — Telephone Encounter (Signed)
 Patient has missed FOUR appointments in rolling 18-month period. Per no show policy, patient can be considered for dismissal.  Alternatively, registrars can contact patient to reschedule missed appointment and communicate NS policy. Please advise.

## 2024-05-05 NOTE — Telephone Encounter (Signed)
 Chart updated and letter sent

## 2024-05-06 ENCOUNTER — Ambulatory Visit: Payer: Self-pay | Admitting: *Deleted

## 2024-05-06 DIAGNOSIS — M51372 Other intervertebral disc degeneration, lumbosacral region with discogenic back pain and lower extremity pain: Secondary | ICD-10-CM | POA: Diagnosis not present

## 2024-05-06 NOTE — Telephone Encounter (Signed)
 Pt was d/c from clinic.

## 2024-05-06 NOTE — Telephone Encounter (Signed)
 FYI Only or Action Required?: Action required by provider: request for appointment.  Patient was last seen in primary care on 02/23/2024 by Bevely Doffing, FNP.  Called Nurse Triage reporting No chief complaint on file..back pain  Symptoms began chronic.  Interventions attempted: prescribed medictaions  Symptoms are: gradually worsening.  Triage Disposition: See HCP Within 4 Hours (Or PCP Triage)  Patient/caregiver understands and will follow disposition?: Call from patient caregiver: requesting appointment for patient due to increased back pain. She will be bring him to appointment - may contact her for scheduling: Tedi Betters- [phone]. No appointment available within disposition- call sent for review and scheduling.  Reason for Disposition  [1] SEVERE back pain (e.g., excruciating, unable to do any normal activities) AND [2] not improved 2 hours after pain medicine  Answer Assessment - Initial Assessment Questions 1. ONSET: When did the pain begin? (e.g., minutes, hours, days)     chronic 2. LOCATION: Where does it hurt? (upper, mid or lower back)     All over the back 3. SEVERITY: How bad is the pain?  (e.g., Scale 1-10; mild, moderate, or severe)     severe 4. PATTERN: Is the pain constant? (e.g., yes, no; constant, intermittent)      constant  6. CAUSE:  What do you think is causing the back pain?      unsure  8. MEDICINES: What have you taken so far for the pain? (e.g., nothing, acetaminophen , NSAIDS)     unsure 9. NEUROLOGIC SYMPTOMS: Do you have any weakness, numbness, or problems with bowel/bladder control?     Possible numbness 10. OTHER SYMPTOMS: Do you have any other symptoms? (e.g., fever, abdomen pain, burning with urination, blood in urine)       No  Caregiver is calling: Patient is in increased pain  Protocols used: Back Pain-A-AH   Copied from CRM [id number]. Topic: Clinical - Red Word Triage >> May 06, 2024  9:27 AM Treva T wrote: [employer] that prompted transfer to Nurse Triage: Patient care giver, Tedi Betters, calling on behalf of patient states he is having severe back pain.

## 2024-05-07 DIAGNOSIS — M51372 Other intervertebral disc degeneration, lumbosacral region with discogenic back pain and lower extremity pain: Secondary | ICD-10-CM | POA: Diagnosis not present

## 2024-05-08 DIAGNOSIS — M51372 Other intervertebral disc degeneration, lumbosacral region with discogenic back pain and lower extremity pain: Secondary | ICD-10-CM | POA: Diagnosis not present

## 2024-05-09 DIAGNOSIS — M51372 Other intervertebral disc degeneration, lumbosacral region with discogenic back pain and lower extremity pain: Secondary | ICD-10-CM | POA: Diagnosis not present

## 2024-05-10 DIAGNOSIS — M51372 Other intervertebral disc degeneration, lumbosacral region with discogenic back pain and lower extremity pain: Secondary | ICD-10-CM | POA: Diagnosis not present

## 2024-05-11 DIAGNOSIS — M51372 Other intervertebral disc degeneration, lumbosacral region with discogenic back pain and lower extremity pain: Secondary | ICD-10-CM | POA: Diagnosis not present

## 2024-05-12 DIAGNOSIS — M51372 Other intervertebral disc degeneration, lumbosacral region with discogenic back pain and lower extremity pain: Secondary | ICD-10-CM | POA: Diagnosis not present

## 2024-05-13 DIAGNOSIS — M51372 Other intervertebral disc degeneration, lumbosacral region with discogenic back pain and lower extremity pain: Secondary | ICD-10-CM | POA: Diagnosis not present

## 2024-05-14 DIAGNOSIS — M51372 Other intervertebral disc degeneration, lumbosacral region with discogenic back pain and lower extremity pain: Secondary | ICD-10-CM | POA: Diagnosis not present

## 2024-05-15 DIAGNOSIS — M51372 Other intervertebral disc degeneration, lumbosacral region with discogenic back pain and lower extremity pain: Secondary | ICD-10-CM | POA: Diagnosis not present

## 2024-05-16 DIAGNOSIS — M51372 Other intervertebral disc degeneration, lumbosacral region with discogenic back pain and lower extremity pain: Secondary | ICD-10-CM | POA: Diagnosis not present

## 2024-05-17 DIAGNOSIS — M51372 Other intervertebral disc degeneration, lumbosacral region with discogenic back pain and lower extremity pain: Secondary | ICD-10-CM | POA: Diagnosis not present

## 2024-05-18 DIAGNOSIS — M51372 Other intervertebral disc degeneration, lumbosacral region with discogenic back pain and lower extremity pain: Secondary | ICD-10-CM | POA: Diagnosis not present

## 2024-05-19 ENCOUNTER — Ambulatory Visit: Payer: Self-pay | Admitting: Nurse Practitioner

## 2024-05-19 DIAGNOSIS — M51372 Other intervertebral disc degeneration, lumbosacral region with discogenic back pain and lower extremity pain: Secondary | ICD-10-CM | POA: Diagnosis not present

## 2024-05-20 DIAGNOSIS — M51372 Other intervertebral disc degeneration, lumbosacral region with discogenic back pain and lower extremity pain: Secondary | ICD-10-CM | POA: Diagnosis not present

## 2024-05-21 ENCOUNTER — Ambulatory Visit: Admitting: Allergy & Immunology

## 2024-05-21 DIAGNOSIS — M51372 Other intervertebral disc degeneration, lumbosacral region with discogenic back pain and lower extremity pain: Secondary | ICD-10-CM | POA: Diagnosis not present

## 2024-05-22 DIAGNOSIS — M51372 Other intervertebral disc degeneration, lumbosacral region with discogenic back pain and lower extremity pain: Secondary | ICD-10-CM | POA: Diagnosis not present

## 2024-05-23 DIAGNOSIS — M51372 Other intervertebral disc degeneration, lumbosacral region with discogenic back pain and lower extremity pain: Secondary | ICD-10-CM | POA: Diagnosis not present

## 2024-05-24 DIAGNOSIS — M51372 Other intervertebral disc degeneration, lumbosacral region with discogenic back pain and lower extremity pain: Secondary | ICD-10-CM | POA: Diagnosis not present

## 2024-05-25 DIAGNOSIS — M51372 Other intervertebral disc degeneration, lumbosacral region with discogenic back pain and lower extremity pain: Secondary | ICD-10-CM | POA: Diagnosis not present

## 2024-05-26 ENCOUNTER — Ambulatory Visit: Payer: Self-pay | Admitting: Nurse Practitioner

## 2024-05-26 DIAGNOSIS — M51372 Other intervertebral disc degeneration, lumbosacral region with discogenic back pain and lower extremity pain: Secondary | ICD-10-CM | POA: Diagnosis not present

## 2024-05-27 DIAGNOSIS — M51372 Other intervertebral disc degeneration, lumbosacral region with discogenic back pain and lower extremity pain: Secondary | ICD-10-CM | POA: Diagnosis not present

## 2024-05-28 DIAGNOSIS — M51372 Other intervertebral disc degeneration, lumbosacral region with discogenic back pain and lower extremity pain: Secondary | ICD-10-CM | POA: Diagnosis not present

## 2024-05-29 DIAGNOSIS — M51372 Other intervertebral disc degeneration, lumbosacral region with discogenic back pain and lower extremity pain: Secondary | ICD-10-CM | POA: Diagnosis not present

## 2024-05-30 DIAGNOSIS — M51372 Other intervertebral disc degeneration, lumbosacral region with discogenic back pain and lower extremity pain: Secondary | ICD-10-CM | POA: Diagnosis not present

## 2024-05-31 DIAGNOSIS — M51372 Other intervertebral disc degeneration, lumbosacral region with discogenic back pain and lower extremity pain: Secondary | ICD-10-CM | POA: Diagnosis not present

## 2024-06-01 DIAGNOSIS — M51372 Other intervertebral disc degeneration, lumbosacral region with discogenic back pain and lower extremity pain: Secondary | ICD-10-CM | POA: Diagnosis not present

## 2024-06-01 DIAGNOSIS — Z419 Encounter for procedure for purposes other than remedying health state, unspecified: Secondary | ICD-10-CM | POA: Diagnosis not present

## 2024-06-02 DIAGNOSIS — M51372 Other intervertebral disc degeneration, lumbosacral region with discogenic back pain and lower extremity pain: Secondary | ICD-10-CM | POA: Diagnosis not present

## 2024-06-03 DIAGNOSIS — M51372 Other intervertebral disc degeneration, lumbosacral region with discogenic back pain and lower extremity pain: Secondary | ICD-10-CM | POA: Diagnosis not present

## 2024-06-04 DIAGNOSIS — M51372 Other intervertebral disc degeneration, lumbosacral region with discogenic back pain and lower extremity pain: Secondary | ICD-10-CM | POA: Diagnosis not present

## 2024-06-05 DIAGNOSIS — M51372 Other intervertebral disc degeneration, lumbosacral region with discogenic back pain and lower extremity pain: Secondary | ICD-10-CM | POA: Diagnosis not present

## 2024-06-06 DIAGNOSIS — M51372 Other intervertebral disc degeneration, lumbosacral region with discogenic back pain and lower extremity pain: Secondary | ICD-10-CM | POA: Diagnosis not present

## 2024-06-07 DIAGNOSIS — M51372 Other intervertebral disc degeneration, lumbosacral region with discogenic back pain and lower extremity pain: Secondary | ICD-10-CM | POA: Diagnosis not present

## 2024-06-08 DIAGNOSIS — M51372 Other intervertebral disc degeneration, lumbosacral region with discogenic back pain and lower extremity pain: Secondary | ICD-10-CM | POA: Diagnosis not present

## 2024-06-09 DIAGNOSIS — M51372 Other intervertebral disc degeneration, lumbosacral region with discogenic back pain and lower extremity pain: Secondary | ICD-10-CM | POA: Diagnosis not present

## 2024-06-10 DIAGNOSIS — M51372 Other intervertebral disc degeneration, lumbosacral region with discogenic back pain and lower extremity pain: Secondary | ICD-10-CM | POA: Diagnosis not present

## 2024-06-11 DIAGNOSIS — M51372 Other intervertebral disc degeneration, lumbosacral region with discogenic back pain and lower extremity pain: Secondary | ICD-10-CM | POA: Diagnosis not present

## 2024-06-12 DIAGNOSIS — M51372 Other intervertebral disc degeneration, lumbosacral region with discogenic back pain and lower extremity pain: Secondary | ICD-10-CM | POA: Diagnosis not present

## 2024-06-13 DIAGNOSIS — M51372 Other intervertebral disc degeneration, lumbosacral region with discogenic back pain and lower extremity pain: Secondary | ICD-10-CM | POA: Diagnosis not present

## 2024-06-14 DIAGNOSIS — M51372 Other intervertebral disc degeneration, lumbosacral region with discogenic back pain and lower extremity pain: Secondary | ICD-10-CM | POA: Diagnosis not present

## 2024-06-15 DIAGNOSIS — M51372 Other intervertebral disc degeneration, lumbosacral region with discogenic back pain and lower extremity pain: Secondary | ICD-10-CM | POA: Diagnosis not present

## 2024-06-16 DIAGNOSIS — M51372 Other intervertebral disc degeneration, lumbosacral region with discogenic back pain and lower extremity pain: Secondary | ICD-10-CM | POA: Diagnosis not present

## 2024-06-17 DIAGNOSIS — M51372 Other intervertebral disc degeneration, lumbosacral region with discogenic back pain and lower extremity pain: Secondary | ICD-10-CM | POA: Diagnosis not present

## 2024-06-18 DIAGNOSIS — M51372 Other intervertebral disc degeneration, lumbosacral region with discogenic back pain and lower extremity pain: Secondary | ICD-10-CM | POA: Diagnosis not present

## 2024-06-19 ENCOUNTER — Emergency Department (HOSPITAL_COMMUNITY)

## 2024-06-19 ENCOUNTER — Emergency Department (HOSPITAL_COMMUNITY): Admission: EM | Admit: 2024-06-19 | Discharge: 2024-06-19 | Disposition: A | Discharge status: Home / Self Care

## 2024-06-19 ENCOUNTER — Encounter (HOSPITAL_COMMUNITY): Payer: Self-pay

## 2024-06-19 ENCOUNTER — Other Ambulatory Visit: Payer: Self-pay

## 2024-06-19 DIAGNOSIS — I878 Other specified disorders of veins: Secondary | ICD-10-CM | POA: Diagnosis not present

## 2024-06-19 DIAGNOSIS — M1611 Unilateral primary osteoarthritis, right hip: Secondary | ICD-10-CM | POA: Diagnosis not present

## 2024-06-19 DIAGNOSIS — M542 Cervicalgia: Secondary | ICD-10-CM | POA: Diagnosis present

## 2024-06-19 DIAGNOSIS — M25551 Pain in right hip: Secondary | ICD-10-CM | POA: Insufficient documentation

## 2024-06-19 DIAGNOSIS — M51372 Other intervertebral disc degeneration, lumbosacral region with discogenic back pain and lower extremity pain: Secondary | ICD-10-CM | POA: Diagnosis not present

## 2024-06-19 DIAGNOSIS — M5030 Other cervical disc degeneration, unspecified cervical region: Secondary | ICD-10-CM | POA: Diagnosis not present

## 2024-06-19 DIAGNOSIS — Z7901 Long term (current) use of anticoagulants: Secondary | ICD-10-CM | POA: Insufficient documentation

## 2024-06-19 DIAGNOSIS — M5412 Radiculopathy, cervical region: Secondary | ICD-10-CM | POA: Insufficient documentation

## 2024-06-19 DIAGNOSIS — M4802 Spinal stenosis, cervical region: Secondary | ICD-10-CM | POA: Diagnosis not present

## 2024-06-19 LAB — COMPREHENSIVE METABOLIC PANEL WITH GFR
ALT: 13 U/L (ref 0–44)
AST: 22 U/L (ref 15–41)
Albumin: 3.8 g/dL (ref 3.5–5.0)
Alkaline Phosphatase: 74 U/L (ref 38–126)
Anion gap: 10 (ref 5–15)
BUN: 16 mg/dL (ref 6–20)
CO2: 26 mmol/L (ref 22–32)
Calcium: 8.9 mg/dL (ref 8.9–10.3)
Chloride: 104 mmol/L (ref 98–111)
Creatinine, Ser: 1.38 mg/dL — ABNORMAL HIGH (ref 0.61–1.24)
GFR, Estimated: >60 mL/min (ref >60)
Glucose, Bld: 90 mg/dL (ref 70–99)
Potassium: 4.3 mmol/L (ref 3.5–5.1)
Sodium: 140 mmol/L (ref 135–145)
Total Bilirubin: 0.6 mg/dL (ref 0.0–1.2)
Total Protein: 6.8 g/dL (ref 6.5–8.1)

## 2024-06-19 LAB — CBC WITH DIFFERENTIAL/PLATELET
Abs Immature Granulocytes: 0.02 K/uL (ref 0.00–0.07)
Basophils Absolute: 0.1 K/uL (ref 0.0–0.1)
Basophils Relative: 1 %
Eosinophils Absolute: 0.5 K/uL (ref 0.0–0.5)
Eosinophils Relative: 6 %
HCT: 41.3 % (ref 39.0–52.0)
Hemoglobin: 14.7 g/dL (ref 13.0–17.0)
Immature Granulocytes: 0 %
Lymphocytes Relative: 43 %
Lymphs Abs: 3.2 K/uL (ref 0.7–4.0)
MCH: 34.1 pg — ABNORMAL HIGH (ref 26.0–34.0)
MCHC: 35.6 g/dL (ref 30.0–36.0)
MCV: 95.8 fL (ref 80.0–100.0)
Monocytes Absolute: 0.6 K/uL (ref 0.1–1.0)
Monocytes Relative: 8 %
Neutro Abs: 3.1 K/uL (ref 1.7–7.7)
Neutrophils Relative %: 42 %
Platelets: 282 K/uL (ref 150–400)
RBC: 4.31 MIL/uL (ref 4.22–5.81)
RDW: 11.8 % (ref 11.5–15.5)
WBC: 7.4 K/uL (ref 4.0–10.5)
nRBC: 0 % (ref 0.0–0.2)

## 2024-06-19 MED ORDER — METHOCARBAMOL 500 MG PO TABS: 500.0000 mg | ORAL_TABLET | Freq: Three times a day (TID) | ORAL | 0 refills | Status: AC | PRN

## 2024-06-19 MED ORDER — HYDROCODONE-ACETAMINOPHEN 5-325 MG PO TABS: 1.0000 | ORAL_TABLET | Freq: Four times a day (QID) | ORAL | 0 refills | Status: DC | PRN

## 2024-06-19 MED ORDER — DICLOFENAC SODIUM 1 % EX GEL: 2.0000 g | Freq: Four times a day (QID) | CUTANEOUS | 0 refills | Status: AC

## 2024-06-19 MED ORDER — HYDROCODONE-ACETAMINOPHEN 5-325 MG PO TABS
1.0000 | ORAL_TABLET | Freq: Once | ORAL | Status: AC
  Administered 2024-06-19: 1 via ORAL
  Filled 2024-06-19: qty 1

## 2024-06-19 NOTE — Discharge Instructions (Signed)
 Your x-rays do show some degenerative changes.  Please follow-up at your appointment on the 16th as scheduled.  Please use the Voltaren  gel and if you are still having pain it is okay to take the Percocet.  Do not drive drink alcohol while taking as it may make you drowsy.  Take the muscle relaxer as needed for muscle spasms but again do not drive or drink alcohol while taking this as it may make you drowsy.  Do not take these at the same time as you take the Percocet if you do take them as these together can cause drowsiness.  Return to the emergency department for fevers or worsening symptoms.

## 2024-06-19 NOTE — ED Provider Triage Note (Signed)
 Emergency Medicine Provider Triage Evaluation Note  HULON FERRON , a 53 y.o. male  was evaluated in triage.  Pt complains of neck and right hip pain that have been going on for greater than 1 year.  The patient denies any calf tenderness.  Does have a history of DVTs but has been compliant with his medications.  Denies any chest pain or shortness of breath  Review of Systems  Positive: Neck and right hip pain Negative: Chest pain, shortness of breath  Physical Exam  BP 100/64   Pulse 65   Temp 98.2 F (36.8 C) (Oral)   Resp 18   Ht 5' 11 (1.803 m)   Wt 83.9 kg   SpO2 99%   BMI 25.80 kg/m  Gen:   Awake, no distress   Resp:  Normal effort  MSK:   Moves extremities without difficulty, equal strength and sensation throughout bilateral upper lower extremities   Medical Decision Making  Medically screening exam initiated at 3:50 PM.  Appropriate orders placed.  OMARIO ANDER was informed that the remainder of the evaluation will be completed by another provider, this initial triage assessment does not replace that evaluation, and the importance of remaining in the ED until their evaluation is complete.     Ula Prentice SAUNDERS, MD 06/19/24 [phone]

## 2024-06-19 NOTE — ED Triage Notes (Signed)
 Pt c/o generalized body ache. Pt states he has two blood clots in his rt leg and a pinched nerve in neck. Pt states coming in today because his pain is just unbearable. When asking how long symptoms have been going on been awhile.

## 2024-06-19 NOTE — ED Provider Notes (Signed)
 [place] EMERGENCY DEPARTMENT AT [facility] Provider Note   CSN: [id number] Arrival date & time: 06/19/24  1416     Patient presents with: Generalized Body Aches   Lance Cohen is a 53 y.o. male.   53 year old male with past medical history of DVT on Eliquis  who has not missed any doses as well as known cervical radiculopathy and chronic right hip pain presenting to the emergency department today with pain in his right neck and right hip.  The patient states that this been going on now for roughly 1 year.  He states he has never had any imaging of his hip.  Denies any recent injuries.  He denies any calf pain or swelling.  He states that this has been acutely worse over the past 2 to 3 days.  Denies any associated fevers.  He denies any bowel or bladder dysfunction or saddle anesthesia.  Came to the emergency department today due to these ongoing symptoms.        Prior to Admission medications   Medication Sig Start Date End Date Taking? Authorizing Provider  diclofenac  Sodium (VOLTAREN ) 1 % GEL Apply 2 g topically 4 (four) times daily. 06/19/24  Yes Ula Prentice SAUNDERS, MD  HYDROcodone -acetaminophen  (NORCO/VICODIN) 5-325 MG tablet Take 1 tablet by mouth every 6 (six) hours as needed for moderate pain (pain score 4-6) or severe pain (pain score 7-10). 06/19/24  Yes Ula Prentice SAUNDERS, MD  methocarbamol  (ROBAXIN ) 500 MG tablet Take 1 tablet (500 mg total) by mouth every 8 (eight) hours as needed for muscle spasms. 06/19/24  Yes Ula Prentice SAUNDERS, MD  apixaban  (ELIQUIS ) 5 MG TABS tablet Take 1 tablet (5 mg total) by mouth 2 (two) times daily. 12/15/23   Tobie Suzzane POUR, MD  EPINEPHrine  0.3 mg/0.3 mL IJ SOAJ injection Inject 0.3 mg into the muscle as needed for anaphylaxis. 03/24/24   Midge Golas, MD  gabapentin  (NEURONTIN ) 300 MG capsule Take 1 capsule (300 mg total) by mouth 3 (three) times daily. 02/23/24   Bevely Doffing, FNP  methocarbamol  (ROBAXIN ) 500 MG tablet Take 1 tablet (500 mg  total) by mouth 2 (two) times daily. 02/15/24   Yolande Lamar BROCKS, MD  mirtazapine  (REMERON ) 7.5 MG tablet Take 1 tablet (7.5 mg total) by mouth at bedtime. 02/23/24   Bevely Doffing, FNP  predniSONE  (DELTASONE ) 10 MG tablet Take 4 tablets once a day for 3 days. Take 3 tablets once a day for the next 3 days. Take 2 tablets once a day for the next 3 days. Take 1 tablet once a day for the next 3 days. 04/12/24   Theadore Ozell HERO, MD  SYMBICORT  80-4.5 MCG/ACT inhaler Inhale 2 puffs into the lungs 2 (two) times daily. 02/23/24   Bevely Doffing, FNP    Allergies: Lovenox  [enoxaparin  sodium], Bee venom, Penicillins, Zofran  [ondansetron ], Other, and Sulfa  antibiotics    Review of Systems  Musculoskeletal:  Positive for arthralgias.  All other systems reviewed and are negative.   Updated Vital Signs BP 100/64   Pulse 65   Temp 98.2 F (36.8 C) (Oral)   Resp 18   Ht 5' 11 (1.803 m)   Wt 83.9 kg   SpO2 99%   BMI 25.80 kg/m   Physical Exam Vitals and nursing note reviewed.   Gen: NAD Eyes: PERRL, EOMI HEENT: no oropharyngeal swelling Neck: trachea midline Resp: clear to auscultation bilaterally Card: RRR, no murmurs, rubs, or gallops Abd: nontender, nondistended Extremities: no calf tenderness, no edema  MSK: The patient has normal range of motion of the right hip and right shoulder both actively and passively, no overlying erythema noted, compartments are soft in all extremities, the patient is tender over the right proximal femur Vascular: 2+ radial pulses bilaterally, 2+ DP pulses bilaterally Neuro: Equal strength sensation throughout bilateral upper and lower extremities with normal patellar and Achilles reflexes bilaterally Skin: no rashes Psyc: acting appropriately   (all labs ordered are listed, but only abnormal results are displayed) Labs Reviewed  COMPREHENSIVE METABOLIC PANEL WITH GFR - Abnormal; Notable for the following components:      Result Value   Creatinine, Ser 1.38  (*)    All other components within normal limits  CBC WITH DIFFERENTIAL/PLATELET - Abnormal; Notable for the following components:   MCH 34.1 (*)    All other components within normal limits    EKG: None  Radiology: DG Cervical Spine Complete Result Date: 06/19/2024 CLINICAL DATA:  Neck pain. EXAM: CERVICAL SPINE - COMPLETE 4+ VIEW COMPARISON:  Cervical spine radiograph 01/28/2024, MRI 12/05/2023 FINDINGS: Normal cervical spine alignment. Mild diffuse anterior spurring and disc space narrowing from C4-C5 through C6-C7. multilevel facet hypertrophy. No fracture or acute osseous findings. No prevertebral soft tissue thickening. IMPRESSION: Mild multilevel degenerative disc disease and facet hypertrophy. Electronically Signed   By: Andrea Gasman M.D.   On: 06/19/2024 16:51   DG Hip Unilat W or Wo Pelvis 2-3 Views Right Result Date: 06/19/2024 CLINICAL DATA:  Pain. EXAM: DG HIP (WITH OR WITHOUT PELVIS) 2-3V RIGHT COMPARISON:  None Available. FINDINGS: Mild hip joint space narrowing. Acetabular and femoral head neck spurring with subchondral cysts. No fracture, erosion or evidence of avascular necrosis. No evidence of focal bone lesion or bone destruction. Pubic symphysis and sacroiliac joints are congruent. Multiple pelvic phleboliths. 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 Electronically Signed   By: Andrea Gasman M.D.   On: 06/19/2024 16:49     Procedures   Medications Ordered in the ED  HYDROcodone -acetaminophen  (NORCO/VICODIN) 5-325 MG per tablet 1 tablet (1 tablet Oral Given 06/19/24 1613)                                    Medical Decision Making 53 year old male with past medical history of DVT on Eliquis  who reports compliance with his medication presents the emergency department today with right shoulder pain and right hip pain.  I will further evaluate the patient here with x-rays to evaluate for acute bony abnormalities given his age.  Exam is not  consistent with septic arthritis at this time.  Has a reassuring neurovascular exam and suspicion for myelopathy or cauda equina syndrome/acute spinal cord issue requiring emergent invention is low at this time.  Also doubt this is due to DVT as the patient has reproducible pain over his lateral aspect of his right hip.  I will give the patient hydrocodone  for pain and reevaluate.  I have reviewed his labs from triage which are unremarkable.  I will reevaluate for ultimate disposition.  The patient's labs are reassuring.  X-rays do show some degenerative changes which fits with the chronicity and description of his pain.  The patient is given a short course of pain medication as he cannot be on systemic NSAIDs due to his blood thinner usage.  He is also provided some muscle relaxers.  He does have follow-up scheduled on the 16th.  He is encouraged  to keep this point.  He is discharged with return precautions.  Amount and/or Complexity of Data Reviewed Labs: ordered. Radiology: ordered.  Risk Prescription drug management.        Final diagnoses:  Cervical radiculopathy  Right hip pain    ED Discharge Orders          Ordered    HYDROcodone -acetaminophen  (NORCO/VICODIN) 5-325 MG tablet  Every 6 hours PRN        06/19/24 1732    diclofenac  Sodium (VOLTAREN ) 1 % GEL  4 times daily        06/19/24 1732    methocarbamol  (ROBAXIN ) 500 MG tablet  Every 8 hours PRN        06/19/24 1732               Ula Prentice SAUNDERS, MD 06/19/24 1733

## 2024-06-20 DIAGNOSIS — M51372 Other intervertebral disc degeneration, lumbosacral region with discogenic back pain and lower extremity pain: Secondary | ICD-10-CM | POA: Diagnosis not present

## 2024-06-21 DIAGNOSIS — M51372 Other intervertebral disc degeneration, lumbosacral region with discogenic back pain and lower extremity pain: Secondary | ICD-10-CM | POA: Diagnosis not present

## 2024-06-22 DIAGNOSIS — M51372 Other intervertebral disc degeneration, lumbosacral region with discogenic back pain and lower extremity pain: Secondary | ICD-10-CM | POA: Diagnosis not present

## 2024-06-23 DIAGNOSIS — M51372 Other intervertebral disc degeneration, lumbosacral region with discogenic back pain and lower extremity pain: Secondary | ICD-10-CM | POA: Diagnosis not present

## 2024-06-24 DIAGNOSIS — M51372 Other intervertebral disc degeneration, lumbosacral region with discogenic back pain and lower extremity pain: Secondary | ICD-10-CM | POA: Diagnosis not present

## 2024-06-25 DIAGNOSIS — M51372 Other intervertebral disc degeneration, lumbosacral region with discogenic back pain and lower extremity pain: Secondary | ICD-10-CM | POA: Diagnosis not present

## 2024-06-26 DIAGNOSIS — M51372 Other intervertebral disc degeneration, lumbosacral region with discogenic back pain and lower extremity pain: Secondary | ICD-10-CM | POA: Diagnosis not present

## 2024-06-27 DIAGNOSIS — M51372 Other intervertebral disc degeneration, lumbosacral region with discogenic back pain and lower extremity pain: Secondary | ICD-10-CM | POA: Diagnosis not present

## 2024-06-28 DIAGNOSIS — M51372 Other intervertebral disc degeneration, lumbosacral region with discogenic back pain and lower extremity pain: Secondary | ICD-10-CM | POA: Diagnosis not present

## 2024-06-29 DIAGNOSIS — M51372 Other intervertebral disc degeneration, lumbosacral region with discogenic back pain and lower extremity pain: Secondary | ICD-10-CM | POA: Diagnosis not present

## 2024-06-30 DIAGNOSIS — M51372 Other intervertebral disc degeneration, lumbosacral region with discogenic back pain and lower extremity pain: Secondary | ICD-10-CM | POA: Diagnosis not present

## 2024-07-01 DIAGNOSIS — M51372 Other intervertebral disc degeneration, lumbosacral region with discogenic back pain and lower extremity pain: Secondary | ICD-10-CM | POA: Diagnosis not present

## 2024-07-02 DIAGNOSIS — Z419 Encounter for procedure for purposes other than remedying health state, unspecified: Secondary | ICD-10-CM | POA: Diagnosis not present

## 2024-07-02 DIAGNOSIS — M51372 Other intervertebral disc degeneration, lumbosacral region with discogenic back pain and lower extremity pain: Secondary | ICD-10-CM | POA: Diagnosis not present

## 2024-07-03 DIAGNOSIS — M51372 Other intervertebral disc degeneration, lumbosacral region with discogenic back pain and lower extremity pain: Secondary | ICD-10-CM | POA: Diagnosis not present

## 2024-07-04 DIAGNOSIS — M51372 Other intervertebral disc degeneration, lumbosacral region with discogenic back pain and lower extremity pain: Secondary | ICD-10-CM | POA: Diagnosis not present

## 2024-07-05 DIAGNOSIS — M51372 Other intervertebral disc degeneration, lumbosacral region with discogenic back pain and lower extremity pain: Secondary | ICD-10-CM | POA: Diagnosis not present

## 2024-07-06 DIAGNOSIS — R52 Pain, unspecified: Secondary | ICD-10-CM | POA: Diagnosis not present

## 2024-07-06 DIAGNOSIS — M51372 Other intervertebral disc degeneration, lumbosacral region with discogenic back pain and lower extremity pain: Secondary | ICD-10-CM | POA: Diagnosis not present

## 2024-07-06 DIAGNOSIS — Z79899 Other long term (current) drug therapy: Secondary | ICD-10-CM | POA: Diagnosis not present

## 2024-07-06 DIAGNOSIS — Z299 Encounter for prophylactic measures, unspecified: Secondary | ICD-10-CM | POA: Diagnosis not present

## 2024-07-06 DIAGNOSIS — F1111 Opioid abuse, in remission: Secondary | ICD-10-CM | POA: Diagnosis not present

## 2024-07-07 DIAGNOSIS — M51372 Other intervertebral disc degeneration, lumbosacral region with discogenic back pain and lower extremity pain: Secondary | ICD-10-CM | POA: Diagnosis not present

## 2024-07-08 DIAGNOSIS — M51372 Other intervertebral disc degeneration, lumbosacral region with discogenic back pain and lower extremity pain: Secondary | ICD-10-CM | POA: Diagnosis not present

## 2024-07-09 DIAGNOSIS — M51372 Other intervertebral disc degeneration, lumbosacral region with discogenic back pain and lower extremity pain: Secondary | ICD-10-CM | POA: Diagnosis not present

## 2024-07-10 DIAGNOSIS — M51372 Other intervertebral disc degeneration, lumbosacral region with discogenic back pain and lower extremity pain: Secondary | ICD-10-CM | POA: Diagnosis not present

## 2024-07-11 DIAGNOSIS — M51372 Other intervertebral disc degeneration, lumbosacral region with discogenic back pain and lower extremity pain: Secondary | ICD-10-CM | POA: Diagnosis not present

## 2024-07-12 DIAGNOSIS — M51372 Other intervertebral disc degeneration, lumbosacral region with discogenic back pain and lower extremity pain: Secondary | ICD-10-CM | POA: Diagnosis not present

## 2024-07-13 DIAGNOSIS — M51372 Other intervertebral disc degeneration, lumbosacral region with discogenic back pain and lower extremity pain: Secondary | ICD-10-CM | POA: Diagnosis not present

## 2024-07-14 DIAGNOSIS — M51372 Other intervertebral disc degeneration, lumbosacral region with discogenic back pain and lower extremity pain: Secondary | ICD-10-CM | POA: Diagnosis not present

## 2024-07-15 DIAGNOSIS — M51372 Other intervertebral disc degeneration, lumbosacral region with discogenic back pain and lower extremity pain: Secondary | ICD-10-CM | POA: Diagnosis not present

## 2024-07-16 DIAGNOSIS — M51372 Other intervertebral disc degeneration, lumbosacral region with discogenic back pain and lower extremity pain: Secondary | ICD-10-CM | POA: Diagnosis not present

## 2024-07-17 DIAGNOSIS — M51372 Other intervertebral disc degeneration, lumbosacral region with discogenic back pain and lower extremity pain: Secondary | ICD-10-CM | POA: Diagnosis not present

## 2024-07-18 DIAGNOSIS — M51372 Other intervertebral disc degeneration, lumbosacral region with discogenic back pain and lower extremity pain: Secondary | ICD-10-CM | POA: Diagnosis not present

## 2024-07-19 DIAGNOSIS — M51372 Other intervertebral disc degeneration, lumbosacral region with discogenic back pain and lower extremity pain: Secondary | ICD-10-CM | POA: Diagnosis not present

## 2024-07-20 DIAGNOSIS — M51372 Other intervertebral disc degeneration, lumbosacral region with discogenic back pain and lower extremity pain: Secondary | ICD-10-CM | POA: Diagnosis not present

## 2024-07-21 DIAGNOSIS — M51372 Other intervertebral disc degeneration, lumbosacral region with discogenic back pain and lower extremity pain: Secondary | ICD-10-CM | POA: Diagnosis not present

## 2024-07-22 DIAGNOSIS — M51372 Other intervertebral disc degeneration, lumbosacral region with discogenic back pain and lower extremity pain: Secondary | ICD-10-CM | POA: Diagnosis not present

## 2024-07-23 ENCOUNTER — Emergency Department (HOSPITAL_COMMUNITY)
Admission: EM | Admit: 2024-07-23 | Discharge: 2024-07-23 | Disposition: A | Discharge status: Home / Self Care | Attending: Emergency Medicine | Admitting: Emergency Medicine

## 2024-07-23 ENCOUNTER — Other Ambulatory Visit: Payer: Self-pay

## 2024-07-23 ENCOUNTER — Encounter (HOSPITAL_COMMUNITY): Payer: Self-pay | Admitting: Emergency Medicine

## 2024-07-23 ENCOUNTER — Emergency Department (HOSPITAL_COMMUNITY)

## 2024-07-23 DIAGNOSIS — J449 Chronic obstructive pulmonary disease, unspecified: Secondary | ICD-10-CM | POA: Diagnosis not present

## 2024-07-23 DIAGNOSIS — Z760 Encounter for issue of repeat prescription: Secondary | ICD-10-CM | POA: Insufficient documentation

## 2024-07-23 DIAGNOSIS — M7122 Synovial cyst of popliteal space [Baker], left knee: Secondary | ICD-10-CM | POA: Diagnosis not present

## 2024-07-23 DIAGNOSIS — I10 Essential (primary) hypertension: Secondary | ICD-10-CM | POA: Insufficient documentation

## 2024-07-23 DIAGNOSIS — Z79899 Other long term (current) drug therapy: Secondary | ICD-10-CM | POA: Insufficient documentation

## 2024-07-23 DIAGNOSIS — Z7901 Long term (current) use of anticoagulants: Secondary | ICD-10-CM | POA: Insufficient documentation

## 2024-07-23 DIAGNOSIS — M51372 Other intervertebral disc degeneration, lumbosacral region with discogenic back pain and lower extremity pain: Secondary | ICD-10-CM | POA: Diagnosis not present

## 2024-07-23 DIAGNOSIS — M7918 Myalgia, other site: Secondary | ICD-10-CM | POA: Insufficient documentation

## 2024-07-23 DIAGNOSIS — M791 Myalgia, unspecified site: Secondary | ICD-10-CM | POA: Diagnosis not present

## 2024-07-23 DIAGNOSIS — R7989 Other specified abnormal findings of blood chemistry: Secondary | ICD-10-CM | POA: Diagnosis not present

## 2024-07-23 DIAGNOSIS — M79652 Pain in left thigh: Secondary | ICD-10-CM | POA: Diagnosis not present

## 2024-07-23 LAB — BASIC METABOLIC PANEL WITH GFR
Anion gap: 9 (ref 5–15)
BUN: 15 mg/dL (ref 6–20)
CO2: 28 mmol/L (ref 22–32)
Calcium: 9.1 mg/dL (ref 8.9–10.3)
Chloride: 104 mmol/L (ref 98–111)
Creatinine, Ser: 1.02 mg/dL (ref 0.61–1.24)
GFR, Estimated: >60 mL/min (ref >60)
Glucose, Bld: 84 mg/dL (ref 70–99)
Potassium: 4.5 mmol/L (ref 3.5–5.1)
Sodium: 141 mmol/L (ref 135–145)

## 2024-07-23 LAB — CBC
HCT: 40.9 % (ref 39.0–52.0)
Hemoglobin: 14.1 g/dL (ref 13.0–17.0)
MCH: 33.4 pg (ref 26.0–34.0)
MCHC: 34.5 g/dL (ref 30.0–36.0)
MCV: 96.9 fL (ref 80.0–100.0)
Platelets: 245 K/uL (ref 150–400)
RBC: 4.22 MIL/uL (ref 4.22–5.81)
RDW: 11.8 % (ref 11.5–15.5)
WBC: 6.7 K/uL (ref 4.0–10.5)
nRBC: 0 % (ref 0.0–0.2)

## 2024-07-23 LAB — RESP PANEL BY RT-PCR (RSV, FLU A&B, COVID)  RVPGX2
Influenza A by PCR: NEGATIVE
Influenza B by PCR: NEGATIVE
Resp Syncytial Virus by PCR: NEGATIVE
SARS Coronavirus 2 by RT PCR: NEGATIVE

## 2024-07-23 LAB — D-DIMER, QUANTITATIVE: D-Dimer, Quant: 0.84 ug{FEU}/mL — ABNORMAL HIGH (ref 0.00–0.50)

## 2024-07-23 MED ORDER — HYDROCODONE-ACETAMINOPHEN 5-325 MG PO TABS: 1.0000 | ORAL_TABLET | Freq: Four times a day (QID) | ORAL | 0 refills | Status: AC | PRN

## 2024-07-23 MED ORDER — APIXABAN 5 MG PO TABS: 5.0000 mg | ORAL_TABLET | Freq: Two times a day (BID) | ORAL | 0 refills | Status: DC

## 2024-07-23 MED ORDER — HYDROCODONE-ACETAMINOPHEN 5-325 MG PO TABS
1.0000 | ORAL_TABLET | Freq: Once | ORAL | Status: AC
  Administered 2024-07-23: 1 via ORAL
  Filled 2024-07-23: qty 1

## 2024-07-23 NOTE — ED Notes (Signed)
 Pt/family received d/c paperwork at this time. After going over the paperwork any questions, comments, or concerns were answered to the best of this nurse's knowledge. The pt/family verbally acknowledged the teachings/instructions.

## 2024-07-23 NOTE — ED Triage Notes (Signed)
 Patient coming to ED for evaluation of generalized body pain.  Reports hx of bilateral DVT in legs, two pinched in neck, and orthopedic hardware in R arm.  States everything is just hurting.  No reports of recent injury, fevers, and respiratory complaints.

## 2024-07-23 NOTE — Discharge Instructions (Signed)
 Your lab test today and ultrasound are reassuring, you do not have a new DVT.  You have been prescribed a refill of your Eliquis  and a small quantity of pain medication.  It is going to be very important for you to follow-up with a new primary doctor, I have given you several suggestions for doctors in [place] that are taking new patients.  We have also given you some information regarding a local transportation assistance to help you get to your appointments.

## 2024-07-23 NOTE — ED Provider Notes (Signed)
 [place] EMERGENCY DEPARTMENT AT [facility] Provider Note   CSN: [id number] Arrival date & time: 07/23/24  1132     Patient presents with: Generalized Body Aches   Lance Cohen is a 53 y.o. male with a past medical history including hypertension, arthritis, known spinal stenosis, history of DVT who has been compliant with his Eliquis , also history of COPD presenting for evaluation of generalized body pain.  He describes having 2 pinched nerves in his cervical spine and also has hardware in his right forearm secondary to a radial fracture, and states everything has just been hurting for about the past week.  He denies fevers or chills, denies chest pain, shortness of breath, cough, abdominal pain.  He does report pain but not swelling in his left medial thigh which reminds him of symptoms he had with his right sided DVT.  He states he has been compliant with his Eliquis  twice daily but also states he currently does not have a PCP as he missed too many scheduled appointments due to lack of transportation and will run out of his Eliquis  this week unless wel write him a new prescription today.  He denies injuries or falls.   The history is provided by the patient.       Prior to Admission medications   Medication Sig Start Date End Date Taking? Authorizing Provider  apixaban  (ELIQUIS ) 5 MG TABS tablet Take 1 tablet (5 mg total) by mouth 2 (two) times daily. 07/23/24  Yes Lissett Favorite, PA-C  HYDROcodone -acetaminophen  (NORCO/VICODIN) 5-325 MG tablet Take 1 tablet by mouth every 6 (six) hours as needed. 07/23/24  Yes Tori Cupps, PA-C  diclofenac  Sodium (VOLTAREN ) 1 % GEL Apply 2 g topically 4 (four) times daily. 06/19/24   Ula Prentice SAUNDERS, MD  EPINEPHrine  0.3 mg/0.3 mL IJ SOAJ injection Inject 0.3 mg into the muscle as needed for anaphylaxis. 03/24/24   Midge Golas, MD  gabapentin  (NEURONTIN ) 300 MG capsule Take 1 capsule (300 mg total) by mouth 3 (three) times daily. 02/23/24    Bevely Doffing, FNP  methocarbamol  (ROBAXIN ) 500 MG tablet Take 1 tablet (500 mg total) by mouth 2 (two) times daily. 02/15/24   Yolande Lamar BROCKS, MD  methocarbamol  (ROBAXIN ) 500 MG tablet Take 1 tablet (500 mg total) by mouth every 8 (eight) hours as needed for muscle spasms. 06/19/24   Ula Prentice SAUNDERS, MD  mirtazapine  (REMERON ) 7.5 MG tablet Take 1 tablet (7.5 mg total) by mouth at bedtime. 02/23/24   Bevely Doffing, FNP  predniSONE  (DELTASONE ) 10 MG tablet Take 4 tablets once a day for 3 days. Take 3 tablets once a day for the next 3 days. Take 2 tablets once a day for the next 3 days. Take 1 tablet once a day for the next 3 days. 04/12/24   Theadore Ozell HERO, MD  SYMBICORT  80-4.5 MCG/ACT inhaler Inhale 2 puffs into the lungs 2 (two) times daily. 02/23/24   Bevely Doffing, FNP    Allergies: Lovenox  [enoxaparin  sodium], Bee venom, Penicillins, Zofran  [ondansetron ], Other, and Sulfa  antibiotics    Review of Systems  Updated Vital Signs BP 110/64 (BP Location: Right Arm)   Pulse 68   Temp (!) 97.5 F (36.4 C) (Oral)   Resp 18   Ht 5' 11 (1.803 m)   Wt 83.9 kg   SpO2 98%   BMI 25.80 kg/m   Physical Exam  (all labs ordered are listed, but only abnormal results are displayed) Labs Reviewed  D-DIMER, QUANTITATIVE -  Abnormal; Notable for the following components:      Result Value   D-Dimer, Quant 0.84 (*)    All other components within normal limits  RESP PANEL BY RT-PCR (RSV, FLU A&B, COVID)  RVPGX2  CBC  BASIC METABOLIC PANEL WITH GFR    EKG: None  Radiology: US  Venous Img Lower Unilateral Left Result Date: 07/23/2024 CLINICAL DATA:  Left lower extremity thigh pain and elevated D-dimer EXAM: LEFT LOWER EXTREMITY VENOUS DOPPLER ULTRASOUND TECHNIQUE: Gray-scale sonography with compression, as well as color and duplex ultrasound, were performed to evaluate the deep venous system(s) from the level of the common femoral vein through the popliteal and proximal calf veins. COMPARISON:  None  Available. FINDINGS: VENOUS Normal compressibility of the common femoral, superficial femoral, and popliteal veins, as well as the visualized calf veins. Visualized portions of profunda femoral vein and great saphenous vein unremarkable. No filling defects to suggest DVT on grayscale or color Doppler imaging. Doppler waveforms show normal direction of venous flow, normal respiratory plasticity and response to augmentation. Limited views of the contralateral common femoral vein are unremarkable. OTHER Ill-defined fluid collection in the popliteal fossa measures 3.2 x 1.1 x 1.9 cm. Findings are most consistent with a Baker's cyst. Limitations: none IMPRESSION: 1. No evidence of deep or superficial venous thrombosis. 2. Ill-defined Baker's cyst in the medial popliteal fossa. Electronically Signed   By: Wilkie Lent M.D.   On: 07/23/2024 16:00     Procedures   Medications Ordered in the ED  HYDROcodone -acetaminophen  (NORCO/VICODIN) 5-325 MG per tablet 1 tablet (1 tablet Oral Given 07/23/24 1358)                                    Medical Decision Making Patient presenting with acute on chronic musculoskeletal pain, also asking for refill of his Eliquis .  He does not have any transportation, historically has obtained assistance through a home health provider unfortunately he states he had to miss several MD appointments as his caregiver was not available to drive him.  Currently without a PCP and will run out of his Eliquis  within the week.  He is given a refill for his Eliquis  today.  Additionally he has complaint of musculoskeletal pain which is chronic without any new injuries, although has new soreness in his left medial thigh which he has concerns for possibility of a new blood clot.  He does concur he has been compliant with his Eliquis .  Ultrasound imaging today is reassuringly negative for DVT.  He was given a refill for Eliquis , also given a short supply of pain medication, he was also given  information regarding scat transportation to help him get to MD appointments.  As needed follow-up anticipated.  He has had no new injuries, no falls, no indication for musculoskeletal imaging at this time.  Amount and/or Complexity of Data Reviewed Labs: ordered.    Details: Labs reviewed, be met, CBC respiratory panel all negative.  His D-dimer is elevated at 0.84. Radiology: ordered.    Details: Left lower extremity venous ultrasound negative for DVT.  Risk Prescription drug management. Diagnosis or treatment significantly limited by social determinants of health.        Final diagnoses:  Musculoskeletal pain  Medication refill    ED Discharge Orders          Ordered    apixaban  (ELIQUIS ) 5 MG TABS tablet  2 times daily  07/23/24 1627    HYDROcodone -acetaminophen  (NORCO/VICODIN) 5-325 MG tablet  Every 6 hours PRN        07/23/24 1627               Wylder Macomber, PA-C 07/23/24 1634    Towana Ozell BROCKS, MD 07/23/24 1719

## 2024-07-24 DIAGNOSIS — M51372 Other intervertebral disc degeneration, lumbosacral region with discogenic back pain and lower extremity pain: Secondary | ICD-10-CM | POA: Diagnosis not present

## 2024-07-25 DIAGNOSIS — M51372 Other intervertebral disc degeneration, lumbosacral region with discogenic back pain and lower extremity pain: Secondary | ICD-10-CM | POA: Diagnosis not present

## 2024-07-26 DIAGNOSIS — M51372 Other intervertebral disc degeneration, lumbosacral region with discogenic back pain and lower extremity pain: Secondary | ICD-10-CM | POA: Diagnosis not present

## 2024-07-27 DIAGNOSIS — M51372 Other intervertebral disc degeneration, lumbosacral region with discogenic back pain and lower extremity pain: Secondary | ICD-10-CM | POA: Diagnosis not present

## 2024-07-28 DIAGNOSIS — M51372 Other intervertebral disc degeneration, lumbosacral region with discogenic back pain and lower extremity pain: Secondary | ICD-10-CM | POA: Diagnosis not present

## 2024-07-29 DIAGNOSIS — M51372 Other intervertebral disc degeneration, lumbosacral region with discogenic back pain and lower extremity pain: Secondary | ICD-10-CM | POA: Diagnosis not present

## 2024-07-30 DIAGNOSIS — M51372 Other intervertebral disc degeneration, lumbosacral region with discogenic back pain and lower extremity pain: Secondary | ICD-10-CM | POA: Diagnosis not present

## 2024-07-31 DIAGNOSIS — M51372 Other intervertebral disc degeneration, lumbosacral region with discogenic back pain and lower extremity pain: Secondary | ICD-10-CM | POA: Diagnosis not present

## 2024-08-01 DIAGNOSIS — M51372 Other intervertebral disc degeneration, lumbosacral region with discogenic back pain and lower extremity pain: Secondary | ICD-10-CM | POA: Diagnosis not present

## 2024-08-02 DIAGNOSIS — M51372 Other intervertebral disc degeneration, lumbosacral region with discogenic back pain and lower extremity pain: Secondary | ICD-10-CM | POA: Diagnosis not present

## 2024-08-03 DIAGNOSIS — M51372 Other intervertebral disc degeneration, lumbosacral region with discogenic back pain and lower extremity pain: Secondary | ICD-10-CM | POA: Diagnosis not present

## 2024-08-04 DIAGNOSIS — M51372 Other intervertebral disc degeneration, lumbosacral region with discogenic back pain and lower extremity pain: Secondary | ICD-10-CM | POA: Diagnosis not present

## 2024-08-05 DIAGNOSIS — M51372 Other intervertebral disc degeneration, lumbosacral region with discogenic back pain and lower extremity pain: Secondary | ICD-10-CM | POA: Diagnosis not present

## 2024-08-06 DIAGNOSIS — M51372 Other intervertebral disc degeneration, lumbosacral region with discogenic back pain and lower extremity pain: Secondary | ICD-10-CM | POA: Diagnosis not present

## 2024-08-07 DIAGNOSIS — M51372 Other intervertebral disc degeneration, lumbosacral region with discogenic back pain and lower extremity pain: Secondary | ICD-10-CM | POA: Diagnosis not present

## 2024-08-08 DIAGNOSIS — M51372 Other intervertebral disc degeneration, lumbosacral region with discogenic back pain and lower extremity pain: Secondary | ICD-10-CM | POA: Diagnosis not present

## 2024-08-09 DIAGNOSIS — M51372 Other intervertebral disc degeneration, lumbosacral region with discogenic back pain and lower extremity pain: Secondary | ICD-10-CM | POA: Diagnosis not present

## 2024-08-10 DIAGNOSIS — M51372 Other intervertebral disc degeneration, lumbosacral region with discogenic back pain and lower extremity pain: Secondary | ICD-10-CM | POA: Diagnosis not present

## 2024-08-11 DIAGNOSIS — M51372 Other intervertebral disc degeneration, lumbosacral region with discogenic back pain and lower extremity pain: Secondary | ICD-10-CM | POA: Diagnosis not present

## 2024-08-12 DIAGNOSIS — M51372 Other intervertebral disc degeneration, lumbosacral region with discogenic back pain and lower extremity pain: Secondary | ICD-10-CM | POA: Diagnosis not present

## 2024-08-13 DIAGNOSIS — M51372 Other intervertebral disc degeneration, lumbosacral region with discogenic back pain and lower extremity pain: Secondary | ICD-10-CM | POA: Diagnosis not present

## 2024-08-14 DIAGNOSIS — M51372 Other intervertebral disc degeneration, lumbosacral region with discogenic back pain and lower extremity pain: Secondary | ICD-10-CM | POA: Diagnosis not present

## 2024-08-15 DIAGNOSIS — M51372 Other intervertebral disc degeneration, lumbosacral region with discogenic back pain and lower extremity pain: Secondary | ICD-10-CM | POA: Diagnosis not present

## 2024-08-16 DIAGNOSIS — M51372 Other intervertebral disc degeneration, lumbosacral region with discogenic back pain and lower extremity pain: Secondary | ICD-10-CM | POA: Diagnosis not present

## 2024-08-17 DIAGNOSIS — M51372 Other intervertebral disc degeneration, lumbosacral region with discogenic back pain and lower extremity pain: Secondary | ICD-10-CM | POA: Diagnosis not present

## 2024-08-18 DIAGNOSIS — M51372 Other intervertebral disc degeneration, lumbosacral region with discogenic back pain and lower extremity pain: Secondary | ICD-10-CM | POA: Diagnosis not present

## 2024-08-19 DIAGNOSIS — M51372 Other intervertebral disc degeneration, lumbosacral region with discogenic back pain and lower extremity pain: Secondary | ICD-10-CM | POA: Diagnosis not present

## 2024-08-20 DIAGNOSIS — M51372 Other intervertebral disc degeneration, lumbosacral region with discogenic back pain and lower extremity pain: Secondary | ICD-10-CM | POA: Diagnosis not present

## 2024-08-21 DIAGNOSIS — M51372 Other intervertebral disc degeneration, lumbosacral region with discogenic back pain and lower extremity pain: Secondary | ICD-10-CM | POA: Diagnosis not present

## 2024-08-22 DIAGNOSIS — M51372 Other intervertebral disc degeneration, lumbosacral region with discogenic back pain and lower extremity pain: Secondary | ICD-10-CM | POA: Diagnosis not present

## 2024-08-23 ENCOUNTER — Encounter: Payer: Self-pay | Admitting: Radiology

## 2024-08-23 DIAGNOSIS — M51372 Other intervertebral disc degeneration, lumbosacral region with discogenic back pain and lower extremity pain: Secondary | ICD-10-CM | POA: Diagnosis not present

## 2024-08-24 DIAGNOSIS — M51372 Other intervertebral disc degeneration, lumbosacral region with discogenic back pain and lower extremity pain: Secondary | ICD-10-CM | POA: Diagnosis not present

## 2024-08-25 DIAGNOSIS — M51372 Other intervertebral disc degeneration, lumbosacral region with discogenic back pain and lower extremity pain: Secondary | ICD-10-CM | POA: Diagnosis not present

## 2024-08-26 DIAGNOSIS — M51372 Other intervertebral disc degeneration, lumbosacral region with discogenic back pain and lower extremity pain: Secondary | ICD-10-CM | POA: Diagnosis not present

## 2024-08-27 DIAGNOSIS — M51372 Other intervertebral disc degeneration, lumbosacral region with discogenic back pain and lower extremity pain: Secondary | ICD-10-CM | POA: Diagnosis not present

## 2024-08-28 DIAGNOSIS — M51372 Other intervertebral disc degeneration, lumbosacral region with discogenic back pain and lower extremity pain: Secondary | ICD-10-CM | POA: Diagnosis not present

## 2024-08-30 DIAGNOSIS — M51372 Other intervertebral disc degeneration, lumbosacral region with discogenic back pain and lower extremity pain: Secondary | ICD-10-CM | POA: Diagnosis not present

## 2024-08-31 DIAGNOSIS — M51372 Other intervertebral disc degeneration, lumbosacral region with discogenic back pain and lower extremity pain: Secondary | ICD-10-CM | POA: Diagnosis not present

## 2024-09-01 DIAGNOSIS — Z419 Encounter for procedure for purposes other than remedying health state, unspecified: Secondary | ICD-10-CM | POA: Diagnosis not present

## 2024-09-01 DIAGNOSIS — M51372 Other intervertebral disc degeneration, lumbosacral region with discogenic back pain and lower extremity pain: Secondary | ICD-10-CM | POA: Diagnosis not present

## 2024-09-02 DIAGNOSIS — M51372 Other intervertebral disc degeneration, lumbosacral region with discogenic back pain and lower extremity pain: Secondary | ICD-10-CM | POA: Diagnosis not present

## 2024-09-03 DIAGNOSIS — M51372 Other intervertebral disc degeneration, lumbosacral region with discogenic back pain and lower extremity pain: Secondary | ICD-10-CM | POA: Diagnosis not present

## 2024-09-04 DIAGNOSIS — M51372 Other intervertebral disc degeneration, lumbosacral region with discogenic back pain and lower extremity pain: Secondary | ICD-10-CM | POA: Diagnosis not present

## 2024-09-06 ENCOUNTER — Emergency Department (HOSPITAL_COMMUNITY)
Admission: EM | Admit: 2024-09-06 | Discharge: 2024-09-06 | Disposition: A | Discharge status: Home / Self Care | Attending: Emergency Medicine | Admitting: Emergency Medicine

## 2024-09-06 ENCOUNTER — Encounter (HOSPITAL_COMMUNITY): Payer: Self-pay | Admitting: Emergency Medicine

## 2024-09-06 ENCOUNTER — Other Ambulatory Visit: Payer: Self-pay

## 2024-09-06 ENCOUNTER — Emergency Department (HOSPITAL_COMMUNITY)

## 2024-09-06 DIAGNOSIS — M47812 Spondylosis without myelopathy or radiculopathy, cervical region: Secondary | ICD-10-CM | POA: Diagnosis not present

## 2024-09-06 DIAGNOSIS — Z7901 Long term (current) use of anticoagulants: Secondary | ICD-10-CM | POA: Diagnosis not present

## 2024-09-06 DIAGNOSIS — J449 Chronic obstructive pulmonary disease, unspecified: Secondary | ICD-10-CM | POA: Insufficient documentation

## 2024-09-06 DIAGNOSIS — I10 Essential (primary) hypertension: Secondary | ICD-10-CM | POA: Insufficient documentation

## 2024-09-06 DIAGNOSIS — M4802 Spinal stenosis, cervical region: Secondary | ICD-10-CM | POA: Diagnosis not present

## 2024-09-06 DIAGNOSIS — R519 Headache, unspecified: Secondary | ICD-10-CM | POA: Diagnosis not present

## 2024-09-06 DIAGNOSIS — M51372 Other intervertebral disc degeneration, lumbosacral region with discogenic back pain and lower extremity pain: Secondary | ICD-10-CM | POA: Diagnosis not present

## 2024-09-06 DIAGNOSIS — M542 Cervicalgia: Secondary | ICD-10-CM | POA: Diagnosis not present

## 2024-09-06 DIAGNOSIS — R1032 Left lower quadrant pain: Secondary | ICD-10-CM | POA: Diagnosis not present

## 2024-09-06 MED ORDER — GABAPENTIN 100 MG PO CAPS: 200.0000 mg | ORAL_CAPSULE | Freq: Three times a day (TID) | ORAL | 1 refills | Status: DC

## 2024-09-06 MED ORDER — OXYCODONE-ACETAMINOPHEN 5-325 MG PO TABS
1.0000 | ORAL_TABLET | Freq: Once | ORAL | Status: AC
  Administered 2024-09-06: 1 via ORAL
  Filled 2024-09-06: qty 1

## 2024-09-06 MED ORDER — DIPHENHYDRAMINE HCL 25 MG PO CAPS
25.0000 mg | ORAL_CAPSULE | Freq: Once | ORAL | Status: AC
  Administered 2024-09-06: 25 mg via ORAL
  Filled 2024-09-06: qty 1

## 2024-09-06 MED ORDER — OXYCODONE-ACETAMINOPHEN 5-325 MG PO TABS: 1.0000 | ORAL_TABLET | Freq: Three times a day (TID) | ORAL | 0 refills | Status: AC | PRN

## 2024-09-06 MED ORDER — PREDNISONE 10 MG (21) PO TBPK: ORAL_TABLET | Freq: Every day | ORAL | 0 refills | Status: AC

## 2024-09-06 MED ORDER — METOCLOPRAMIDE HCL 10 MG PO TABS
10.0000 mg | ORAL_TABLET | Freq: Once | ORAL | Status: AC
  Administered 2024-09-06: 10 mg via ORAL
  Filled 2024-09-06: qty 1

## 2024-09-06 MED ORDER — METHOCARBAMOL 500 MG PO TABS: 500.0000 mg | ORAL_TABLET | Freq: Four times a day (QID) | ORAL | 0 refills | Status: AC | PRN

## 2024-09-06 MED ORDER — METHOCARBAMOL 500 MG PO TABS
1000.0000 mg | ORAL_TABLET | Freq: Once | ORAL | Status: AC
  Administered 2024-09-06: 1000 mg via ORAL
  Filled 2024-09-06: qty 2

## 2024-09-06 MED ORDER — GABAPENTIN 100 MG PO CAPS
200.0000 mg | ORAL_CAPSULE | Freq: Once | ORAL | Status: AC
  Administered 2024-09-06: 200 mg via ORAL
  Filled 2024-09-06: qty 2

## 2024-09-06 NOTE — ED Provider Notes (Signed)
 [facility] AT [facility] Provider Note   CSN: [id number] Arrival date & time: 09/06/24  1031     Patient presents with: pain   Lance Cohen is a 53 y.o. male.   HPI Patient presents for multiple areas of pain.  Medical history includes HTN, COPD, substance abuse, GERD, anxiety, arthritis, DVT, bipolar disorder.  He was seen in the ED 6 weeks ago for generalized pain.  This included left medial thigh pain.  DVT study at that time was negative.  He continues on Eliquis .  Patient reports chronic pain in his neck which he attributes to pinched nerves.  Pain will wax and wane in severity.  It has been ongoing for years.  Recently, he has had worsened pain in his neck which radiates to his shoulders.  He has also had a headache for the past 2 days.  Patient also describes a pain in his left groin area.  He denies any associated swelling.    Prior to Admission medications   Medication Sig Start Date End Date Taking? Authorizing Provider  gabapentin  (NEURONTIN ) 100 MG capsule Take 2 capsules (200 mg total) by mouth 3 (three) times daily. 09/06/24 11/05/24 Yes Melvenia Motto, MD  methocarbamol  (ROBAXIN ) 500 MG tablet Take 1 tablet (500 mg total) by mouth every 6 (six) hours as needed for muscle spasms. 09/06/24  Yes Melvenia Motto, MD  oxyCODONE -acetaminophen  (PERCOCET/ROXICET) 5-325 MG tablet Take 1 tablet by mouth every 8 (eight) hours as needed for up to 3 days for severe pain (pain score 7-10). 09/06/24 09/09/24 Yes Melvenia Motto, MD  predniSONE  (STERAPRED UNI-PAK 21 TAB) 10 MG (21) TBPK tablet Take by mouth daily. Take 6 tabs by mouth daily  for 2 days, then 5 tabs for 2 days, then 4 tabs for 2 days, then 3 tabs for 2 days, 2 tabs for 2 days, then 1 tab by mouth daily for 2 days 09/06/24  Yes Melvenia Motto, MD  apixaban  (ELIQUIS ) 5 MG TABS tablet Take 1 tablet (5 mg total) by mouth 2 (two) times daily. 07/23/24   Idol, Julie, PA-C  diclofenac  Sodium (VOLTAREN ) 1 % GEL  Apply 2 g topically 4 (four) times daily. 06/19/24   Ula Prentice SAUNDERS, MD  EPINEPHrine  0.3 mg/0.3 mL IJ SOAJ injection Inject 0.3 mg into the muscle as needed for anaphylaxis. 03/24/24   Midge Golas, MD  gabapentin  (NEURONTIN ) 300 MG capsule Take 1 capsule (300 mg total) by mouth 3 (three) times daily. 02/23/24   Bevely Doffing, FNP  HYDROcodone -acetaminophen  (NORCO/VICODIN) 5-325 MG tablet Take 1 tablet by mouth every 6 (six) hours as needed. 07/23/24   Idol, Julie, PA-C  methocarbamol  (ROBAXIN ) 500 MG tablet Take 1 tablet (500 mg total) by mouth 2 (two) times daily. 02/15/24   Yolande Lamar BROCKS, MD  methocarbamol  (ROBAXIN ) 500 MG tablet Take 1 tablet (500 mg total) by mouth every 8 (eight) hours as needed for muscle spasms. 06/19/24   Ula Prentice SAUNDERS, MD  mirtazapine  (REMERON ) 7.5 MG tablet Take 1 tablet (7.5 mg total) by mouth at bedtime. 02/23/24   Bevely Doffing, FNP  predniSONE  (DELTASONE ) 10 MG tablet Take 4 tablets once a day for 3 days. Take 3 tablets once a day for the next 3 days. Take 2 tablets once a day for the next 3 days. Take 1 tablet once a day for the next 3 days. 04/12/24   Bero, Michael M, MD  SYMBICORT  80-4.5 MCG/ACT inhaler Inhale 2 puffs into the lungs 2 (  two) times daily. 02/23/24   Bevely Doffing, FNP    Allergies: Lovenox  [enoxaparin  sodium], Bee venom, Penicillins, Zofran  [ondansetron ], Other, and Sulfa  antibiotics    Review of Systems  Musculoskeletal:  Positive for arthralgias, back pain and neck pain.  Neurological:  Positive for headaches.  All other systems reviewed and are negative.   Updated Vital Signs BP (!) 120/44 (BP Location: Right Arm)   Pulse (!) 54   Temp (!) 97.3 F (36.3 C)   Resp 20   Ht 5' 11 (1.803 m)   Wt 83.9 kg   SpO2 96%   BMI 25.80 kg/m   Physical Exam Vitals and nursing note reviewed.  Constitutional:      General: He is not in acute distress.    Appearance: Normal appearance. He is well-developed. He is not ill-appearing, toxic-appearing  or diaphoretic.  HENT:     Head: Normocephalic and atraumatic.     Right Ear: External ear normal.     Left Ear: External ear normal.     Nose: Nose normal.     Mouth/Throat:     Mouth: Mucous membranes are moist.  Eyes:     Extraocular Movements: Extraocular movements intact.     Conjunctiva/sclera: Conjunctivae normal.  Cardiovascular:     Rate and Rhythm: Normal rate and regular rhythm.  Pulmonary:     Effort: Pulmonary effort is normal. No respiratory distress.  Abdominal:     General: There is no distension.     Palpations: Abdomen is soft.  Musculoskeletal:        General: No swelling.     Cervical back: Normal range of motion and neck supple.     Comments: Worsen left groin pain with passive stretch of abductor muscles.  Neurological:     General: No focal deficit present.     Mental Status: He is alert and oriented to person, place, and time.     Cranial Nerves: No cranial nerve deficit.     Sensory: No sensory deficit.     Motor: No weakness.     Coordination: Coordination normal.  Psychiatric:        Mood and Affect: Mood normal.     (all labs ordered are listed, but only abnormal results are displayed) Labs Reviewed - No data to display  EKG: None  Radiology: CT Cervical Spine Wo Contrast Result Date: 09/06/2024 EXAM: CT CERVICAL SPINE WITHOUT CONTRAST 09/06/2024 02:21:43 PM TECHNIQUE: CT of the cervical spine was performed without the administration of intravenous contrast. Multiplanar reformatted images are provided for review. Automated exposure control, iterative reconstruction, and/or weight based adjustment of the mA/kV was utilized to reduce the radiation dose to as low as reasonably achievable. COMPARISON: CT cervical spine 07/2022. CLINICAL HISTORY: Neck pain, acute, no red flags. FINDINGS: CERVICAL SPINE: BONES AND ALIGNMENT: The alignment is maintained. No acute fracture or traumatic malalignment. DEGENERATIVE CHANGES: Vacuum phenomenon at multiple  levels. There is mild disc space narrowing at C3-C4. Mild degenerative endplate osteophytes. No high-grade osseous spinal canal stenosis. Facet arthrosis and uncovertebral hypertrophy at multiple levels. Foraminal stenosis is most pronounced on the right at C3-C4 and C4-C5. SOFT TISSUES: No prevertebral soft tissue swelling. IMPRESSION: 1. No acute abnormality of the cervical spine. 2. Degenerative changes as above. Electronically signed by: Donnice Mania MD 09/06/2024 02:54 PM EST RP Workstation: [id number]   CT Head Wo Contrast Result Date: 09/06/2024 EXAM: CT HEAD WITHOUT CONTRAST 09/06/2024 02:21:43 PM TECHNIQUE: CT of the head was performed without the administration of  intravenous contrast. Automated exposure control, iterative reconstruction, and/or weight based adjustment of the mA/kV was utilized to reduce the radiation dose to as low as reasonably achievable. COMPARISON: CT head 07/22/2022. CLINICAL HISTORY: Headache, increasing frequency or severity. FINDINGS: BRAIN AND VENTRICLES: No acute hemorrhage. No evidence of acute infarct. No hydrocephalus. No extra-axial collection. No mass effect or midline shift. ORBITS: No acute abnormality. SINUSES: No acute abnormality. SOFT TISSUES AND SKULL: No acute soft tissue abnormality. No skull fracture. IMPRESSION: 1. No acute intracranial abnormality. Electronically signed by: Donnice Mania MD 09/06/2024 02:48 PM EST RP Workstation: [id number]     Procedures   Medications Ordered in the ED  oxyCODONE -acetaminophen  (PERCOCET/ROXICET) 5-325 MG per tablet 1 tablet (1 tablet Oral Given 09/06/24 1228)  gabapentin  (NEURONTIN ) capsule 200 mg (200 mg Oral Given 09/06/24 1228)  methocarbamol  (ROBAXIN ) tablet 1,000 mg (1,000 mg Oral Given 09/06/24 1228)  metoCLOPramide  (REGLAN ) tablet 10 mg (10 mg Oral Given 09/06/24 1228)  diphenhydrAMINE  (BENADRYL ) capsule 25 mg (25 mg Oral Given 09/06/24 1228)                                    Medical Decision  Making Amount and/or Complexity of Data Reviewed Radiology: ordered.  Risk Prescription drug management.   Patient presenting for multiple areas of pain.  He describes acute on chronic pain in his neck with radiation to shoulders and arms.  He has had a headache for the past 2 days.  He has had left groin pain as well.  Patient has no focal neurologic deficits on exam.  He is alert and oriented.  He is overall well-appearing.  In terms of his groin pain, his pain is worsened with passive stretch of his adductor muscles.  I suspect musculoskeletal etiology.  He denies any recent swelling.  He is on Eliquis  and had a negative DVT study of his left leg 6 weeks ago.  For this and his other areas of pain, Multimodal pain control was ordered.  CT scan of head and cervical spine showed no acute findings.  Symptoms did improve while in the ED.  Patient was discharged in stable condition.     Final diagnoses:  Neck pain    ED Discharge Orders          Ordered    gabapentin  (NEURONTIN ) 100 MG capsule  3 times daily        09/06/24 1512    methocarbamol  (ROBAXIN ) 500 MG tablet  Every 6 hours PRN        09/06/24 1512    oxyCODONE -acetaminophen  (PERCOCET/ROXICET) 5-325 MG tablet  Every 8 hours PRN        09/06/24 1512    predniSONE  (STERAPRED UNI-PAK 21 TAB) 10 MG (21) TBPK tablet  Daily        09/06/24 1512               Melvenia Motto, MD 09/06/24 1512

## 2024-09-06 NOTE — ED Triage Notes (Signed)
 Pt presents with pain all over but mostly concerned with pain to left leg/groin area and neck pain

## 2024-09-06 NOTE — Discharge Instructions (Signed)
 Prescriptions were sent to your pharmacy: -Prednisone  is a steroid.  Take in tapering dose as prescribed to minimize inflammation and improve your nerve pain. -Take gabapentin  for treatment of nerve pain.  Dosing of this can be increased over time as needed. -Methocarbamol  is a muscle relaxer.  Take as needed. -Oxycodone  is a narcotic pain medication.  Take this only as needed.  Call the telephone number below to follow-up with a spine doctor regarding your chronic neck pain.  Return to the emergency department for any new or worsening symptoms of concern.

## 2024-09-07 DIAGNOSIS — M51372 Other intervertebral disc degeneration, lumbosacral region with discogenic back pain and lower extremity pain: Secondary | ICD-10-CM | POA: Diagnosis not present

## 2024-09-08 DIAGNOSIS — M51372 Other intervertebral disc degeneration, lumbosacral region with discogenic back pain and lower extremity pain: Secondary | ICD-10-CM | POA: Diagnosis not present

## 2024-09-09 DIAGNOSIS — M51372 Other intervertebral disc degeneration, lumbosacral region with discogenic back pain and lower extremity pain: Secondary | ICD-10-CM | POA: Diagnosis not present

## 2024-09-10 DIAGNOSIS — M51372 Other intervertebral disc degeneration, lumbosacral region with discogenic back pain and lower extremity pain: Secondary | ICD-10-CM | POA: Diagnosis not present

## 2024-09-11 DIAGNOSIS — M51372 Other intervertebral disc degeneration, lumbosacral region with discogenic back pain and lower extremity pain: Secondary | ICD-10-CM | POA: Diagnosis not present

## 2024-09-12 DIAGNOSIS — M51372 Other intervertebral disc degeneration, lumbosacral region with discogenic back pain and lower extremity pain: Secondary | ICD-10-CM | POA: Diagnosis not present

## 2024-09-13 DIAGNOSIS — M51372 Other intervertebral disc degeneration, lumbosacral region with discogenic back pain and lower extremity pain: Secondary | ICD-10-CM | POA: Diagnosis not present

## 2024-09-14 ENCOUNTER — Encounter: Admitting: Physician Assistant

## 2024-09-14 DIAGNOSIS — M51372 Other intervertebral disc degeneration, lumbosacral region with discogenic back pain and lower extremity pain: Secondary | ICD-10-CM | POA: Diagnosis not present

## 2024-09-15 DIAGNOSIS — M51372 Other intervertebral disc degeneration, lumbosacral region with discogenic back pain and lower extremity pain: Secondary | ICD-10-CM | POA: Diagnosis not present

## 2024-09-17 DIAGNOSIS — M51372 Other intervertebral disc degeneration, lumbosacral region with discogenic back pain and lower extremity pain: Secondary | ICD-10-CM | POA: Diagnosis not present

## 2024-09-18 DIAGNOSIS — M51372 Other intervertebral disc degeneration, lumbosacral region with discogenic back pain and lower extremity pain: Secondary | ICD-10-CM | POA: Diagnosis not present

## 2024-09-19 DIAGNOSIS — M51372 Other intervertebral disc degeneration, lumbosacral region with discogenic back pain and lower extremity pain: Secondary | ICD-10-CM | POA: Diagnosis not present

## 2024-09-20 DIAGNOSIS — M51372 Other intervertebral disc degeneration, lumbosacral region with discogenic back pain and lower extremity pain: Secondary | ICD-10-CM | POA: Diagnosis not present

## 2024-09-21 DIAGNOSIS — M51372 Other intervertebral disc degeneration, lumbosacral region with discogenic back pain and lower extremity pain: Secondary | ICD-10-CM | POA: Diagnosis not present

## 2024-09-23 DIAGNOSIS — M51372 Other intervertebral disc degeneration, lumbosacral region with discogenic back pain and lower extremity pain: Secondary | ICD-10-CM | POA: Diagnosis not present

## 2024-09-24 DIAGNOSIS — M51372 Other intervertebral disc degeneration, lumbosacral region with discogenic back pain and lower extremity pain: Secondary | ICD-10-CM | POA: Diagnosis not present

## 2024-09-25 DIAGNOSIS — M51372 Other intervertebral disc degeneration, lumbosacral region with discogenic back pain and lower extremity pain: Secondary | ICD-10-CM | POA: Diagnosis not present

## 2024-09-26 DIAGNOSIS — M51372 Other intervertebral disc degeneration, lumbosacral region with discogenic back pain and lower extremity pain: Secondary | ICD-10-CM | POA: Diagnosis not present

## 2024-09-27 DIAGNOSIS — M51372 Other intervertebral disc degeneration, lumbosacral region with discogenic back pain and lower extremity pain: Secondary | ICD-10-CM | POA: Diagnosis not present

## 2024-09-28 DIAGNOSIS — M25511 Pain in right shoulder: Secondary | ICD-10-CM | POA: Diagnosis not present

## 2024-09-28 DIAGNOSIS — M4722 Other spondylosis with radiculopathy, cervical region: Secondary | ICD-10-CM | POA: Diagnosis not present

## 2024-09-28 DIAGNOSIS — Z6825 Body mass index (BMI) 25.0-25.9, adult: Secondary | ICD-10-CM | POA: Diagnosis not present

## 2024-09-29 DIAGNOSIS — M51372 Other intervertebral disc degeneration, lumbosacral region with discogenic back pain and lower extremity pain: Secondary | ICD-10-CM | POA: Diagnosis not present

## 2024-09-30 DIAGNOSIS — M51372 Other intervertebral disc degeneration, lumbosacral region with discogenic back pain and lower extremity pain: Secondary | ICD-10-CM | POA: Diagnosis not present

## 2024-10-01 DIAGNOSIS — M51372 Other intervertebral disc degeneration, lumbosacral region with discogenic back pain and lower extremity pain: Secondary | ICD-10-CM | POA: Diagnosis not present

## 2024-10-01 DIAGNOSIS — Z419 Encounter for procedure for purposes other than remedying health state, unspecified: Secondary | ICD-10-CM | POA: Diagnosis not present

## 2024-10-02 DIAGNOSIS — M51372 Other intervertebral disc degeneration, lumbosacral region with discogenic back pain and lower extremity pain: Secondary | ICD-10-CM | POA: Diagnosis not present

## 2024-10-03 DIAGNOSIS — M51372 Other intervertebral disc degeneration, lumbosacral region with discogenic back pain and lower extremity pain: Secondary | ICD-10-CM | POA: Diagnosis not present

## 2024-10-04 DIAGNOSIS — M51372 Other intervertebral disc degeneration, lumbosacral region with discogenic back pain and lower extremity pain: Secondary | ICD-10-CM | POA: Diagnosis not present

## 2024-10-05 DIAGNOSIS — M51372 Other intervertebral disc degeneration, lumbosacral region with discogenic back pain and lower extremity pain: Secondary | ICD-10-CM | POA: Diagnosis not present

## 2024-10-06 ENCOUNTER — Other Ambulatory Visit (HOSPITAL_COMMUNITY): Payer: Self-pay | Admitting: Neurosurgery

## 2024-10-06 DIAGNOSIS — M51372 Other intervertebral disc degeneration, lumbosacral region with discogenic back pain and lower extremity pain: Secondary | ICD-10-CM | POA: Diagnosis not present

## 2024-10-06 DIAGNOSIS — M25511 Pain in right shoulder: Secondary | ICD-10-CM

## 2024-10-07 DIAGNOSIS — M51372 Other intervertebral disc degeneration, lumbosacral region with discogenic back pain and lower extremity pain: Secondary | ICD-10-CM | POA: Diagnosis not present

## 2024-10-26 ENCOUNTER — Ambulatory Visit (INDEPENDENT_AMBULATORY_CARE_PROVIDER_SITE_OTHER): Admitting: Family Medicine

## 2024-10-26 ENCOUNTER — Encounter: Payer: Self-pay | Admitting: Family Medicine

## 2024-10-26 VITALS — BP 110/72 | HR 69 | Ht 71.0 in | Wt 184.6 lb

## 2024-10-26 DIAGNOSIS — F319 Bipolar disorder, unspecified: Secondary | ICD-10-CM | POA: Diagnosis not present

## 2024-10-26 DIAGNOSIS — I82511 Chronic embolism and thrombosis of right femoral vein: Secondary | ICD-10-CM | POA: Diagnosis not present

## 2024-10-26 DIAGNOSIS — F339 Major depressive disorder, recurrent, unspecified: Secondary | ICD-10-CM | POA: Insufficient documentation

## 2024-10-26 DIAGNOSIS — E782 Mixed hyperlipidemia: Secondary | ICD-10-CM

## 2024-10-26 DIAGNOSIS — J45909 Unspecified asthma, uncomplicated: Secondary | ICD-10-CM | POA: Diagnosis not present

## 2024-10-26 DIAGNOSIS — K625 Hemorrhage of anus and rectum: Secondary | ICD-10-CM | POA: Diagnosis not present

## 2024-10-26 DIAGNOSIS — Z0000 Encounter for general adult medical examination without abnormal findings: Secondary | ICD-10-CM | POA: Diagnosis not present

## 2024-10-26 DIAGNOSIS — M25511 Pain in right shoulder: Secondary | ICD-10-CM

## 2024-10-26 DIAGNOSIS — K219 Gastro-esophageal reflux disease without esophagitis: Secondary | ICD-10-CM

## 2024-10-26 DIAGNOSIS — F99 Mental disorder, not otherwise specified: Secondary | ICD-10-CM

## 2024-10-26 DIAGNOSIS — F1910 Other psychoactive substance abuse, uncomplicated: Secondary | ICD-10-CM

## 2024-10-26 DIAGNOSIS — R519 Headache, unspecified: Secondary | ICD-10-CM | POA: Diagnosis not present

## 2024-10-26 DIAGNOSIS — G8929 Other chronic pain: Secondary | ICD-10-CM

## 2024-10-26 DIAGNOSIS — M5417 Radiculopathy, lumbosacral region: Secondary | ICD-10-CM

## 2024-10-26 DIAGNOSIS — Z720 Tobacco use: Secondary | ICD-10-CM

## 2024-10-26 DIAGNOSIS — F411 Generalized anxiety disorder: Secondary | ICD-10-CM

## 2024-10-26 DIAGNOSIS — Z1329 Encounter for screening for other suspected endocrine disorder: Secondary | ICD-10-CM

## 2024-10-26 DIAGNOSIS — Z1211 Encounter for screening for malignant neoplasm of colon: Secondary | ICD-10-CM

## 2024-10-26 DIAGNOSIS — Z131 Encounter for screening for diabetes mellitus: Secondary | ICD-10-CM

## 2024-10-26 DIAGNOSIS — F5105 Insomnia due to other mental disorder: Secondary | ICD-10-CM

## 2024-10-26 LAB — CBC WITH DIFFERENTIAL/PLATELET
Absolute Lymphocytes: 2737 {cells}/uL (ref 850–3900)
Absolute Monocytes: 766 {cells}/uL (ref 200–950)
Basophils Absolute: 97 {cells}/uL (ref 0–200)
Basophils Relative: 1.1 %
Eosinophils Absolute: 730 {cells}/uL — ABNORMAL HIGH (ref 15–500)
Eosinophils Relative: 8.3 %
HCT: 46.2 % (ref 39.4–51.1)
Hemoglobin: 15.4 g/dL (ref 13.2–17.1)
MCH: 32.8 pg (ref 27.0–33.0)
MCHC: 33.3 g/dL (ref 31.6–35.4)
MCV: 98.5 fL (ref 81.4–101.7)
MPV: 9.8 fL (ref 7.5–12.5)
Monocytes Relative: 8.7 %
Neutro Abs: 4470 {cells}/uL (ref 1500–7800)
Neutrophils Relative %: 50.8 %
Platelets: 314 Thousand/uL (ref 140–400)
RBC: 4.69 Million/uL (ref 4.20–5.80)
RDW: 11.7 % (ref 11.0–15.0)
Total Lymphocyte: 31.1 %
WBC: 8.8 Thousand/uL (ref 3.8–10.8)

## 2024-10-26 LAB — COMPREHENSIVE METABOLIC PANEL WITH GFR
AG Ratio: 1.7 (calc) (ref 1.0–2.5)
ALT: 9 U/L (ref 9–46)
AST: 17 U/L (ref 10–35)
Albumin: 4.3 g/dL (ref 3.6–5.1)
Alkaline phosphatase (APISO): 87 U/L (ref 35–144)
BUN: 13 mg/dL (ref 7–25)
CO2: 30 mmol/L (ref 20–32)
Calcium: 9.4 mg/dL (ref 8.6–10.3)
Chloride: 105 mmol/L (ref 98–110)
Creat: 1.07 mg/dL (ref 0.70–1.30)
Globulin: 2.5 g/dL (ref 1.9–3.7)
Glucose, Bld: 59 mg/dL — ABNORMAL LOW (ref 65–99)
Potassium: 4.4 mmol/L (ref 3.5–5.3)
Sodium: 141 mmol/L (ref 135–146)
Total Bilirubin: 0.4 mg/dL (ref 0.2–1.2)
Total Protein: 6.8 g/dL (ref 6.1–8.1)
eGFR: 83 mL/min/1.73m2

## 2024-10-26 LAB — LIPID PANEL
Cholesterol: 151 mg/dL
HDL: 54 mg/dL
LDL Cholesterol (Calc): 81 mg/dL
Non-HDL Cholesterol (Calc): 97 mg/dL
Total CHOL/HDL Ratio: 2.8 (calc)
Triglycerides: 82 mg/dL

## 2024-10-26 LAB — HEMOGLOBIN A1C
Hgb A1c MFr Bld: 5 %
Mean Plasma Glucose: 97 mg/dL
eAG (mmol/L): 5.4 mmol/L

## 2024-10-26 LAB — TSH: TSH: 2.08 m[IU]/L (ref 0.40–4.50)

## 2024-10-26 MED ORDER — GABAPENTIN 300 MG PO CAPS: 300.0000 mg | ORAL_CAPSULE | Freq: Three times a day (TID) | ORAL | 1 refills | Status: AC

## 2024-10-26 MED ORDER — SYMBICORT 80-4.5 MCG/ACT IN AERO: 2.0000 | INHALATION_SPRAY | Freq: Two times a day (BID) | RESPIRATORY_TRACT | 5 refills | Status: AC

## 2024-10-26 MED ORDER — MIRTAZAPINE 7.5 MG PO TABS: 7.5000 mg | ORAL_TABLET | Freq: Every day | ORAL | 5 refills | Status: AC

## 2024-10-26 MED ORDER — APIXABAN 5 MG PO TABS: 5.0000 mg | ORAL_TABLET | Freq: Two times a day (BID) | ORAL | 0 refills | Status: AC

## 2024-10-26 MED ORDER — NICOTINE 21 MG/24HR TD PT24: 21.0000 mg | MEDICATED_PATCH | Freq: Every day | TRANSDERMAL | 0 refills | Status: AC

## 2024-10-26 NOTE — Patient Instructions (Signed)
 It was great to meet you today and I'm excited to have you join the [facility] practice. I hope you had a positive experience today! If you feel so inclined, please feel free to recommend our practice to friends and family. Jeoffrey Barrio, FNP-C

## 2024-10-26 NOTE — Progress Notes (Signed)
 "  New Patient Office Visit  Patient ID: Lance Cohen, Male   DOB: 22-Dec-1970 54 y.o. MRN: [id number]  Chief Complaint  Patient presents with   Establish Care    Back pain    Subjective:     Lance Cohen presents to establish care. Oriented to practice routines and expectations. PMH includes anxiety, depression, bipolar I disorder, asthma, COPD, HTN, DVT, bradycardia, spinal stenosis.  HPI  Discussed the use of AI scribe software for clinical note transcription with the patient, who gave verbal consent to proceed.  History of Present Illness Lance Cohen is a 54 year old male with a history of blood clots, anxiety, depression, bipolar disorder, and chronic pain who presents with severe headaches and vision changes.  He experiences severe headaches originating from the back of his neck and radiating to the top of his head on the left side. These episodes have occurred three times in the past month, each lasting about half a day, with one episode extending to two days. During these episodes, he experiences vision changes where everything appears 'bright white' for a few minutes. The headaches are described as worse than a migraine and occur while he is lying in bed, without specific triggers.  He has a history of three blood clots in his right leg and is not currently under the care of a vascular surgeon. He was previously taking Eliquis  but has not been on it recently due to lack of medication.  He has a history of anxiety, depression, and bipolar disorder. He is not currently seeing a therapist or psychiatrist and is not on any psychiatric medications. He experiences daily thoughts of self-harm but denies having a plan. He previously attended [facility] for medication management but discontinued due to dissatisfaction with treatment. He was on mirtazapine  and gabapentin , which he has not taken since May.  He experiences chronic pain due to two pinched nerves in his neck,  causing pain in his shoulder and arm. He was advised to have an MRI of his shoulder before proceeding with spine surgery. He previously took gabapentin  but has not had any recently.  He experiences occasional rectal bleeding, which he attributes to bleeding hemorrhoids. He had a colonoscopy a few years ago but has not seen a gastroenterologist recently.  He has a history of asthma and COPD, with significant shortness of breath. He has used inhalers in the past but is not currently on any respiratory medications.  He has a history of substance use, currently drinking a fifth of liquor every few days and smoking half a pack of cigarettes daily. He also smokes marijuana. He wants to quit smoking and has previously attempted to quit cold turkey.   Outpatient Encounter Medications as of 10/26/2024  Medication Sig   nicotine  (NICODERM CQ  - DOSED IN MG/24 HOURS) 21 mg/24hr patch Place 1 patch (21 mg total) onto the skin daily.   [DISCONTINUED] apixaban  (ELIQUIS ) 5 MG TABS tablet Take 1 tablet (5 mg total) by mouth 2 (two) times daily.   apixaban  (ELIQUIS ) 5 MG TABS tablet Take 1 tablet (5 mg total) by mouth 2 (two) times daily.   diclofenac  Sodium (VOLTAREN ) 1 % GEL Apply 2 g topically 4 (four) times daily. (Patient not taking: Reported on 10/26/2024)   EPINEPHrine  0.3 mg/0.3 mL IJ SOAJ injection Inject 0.3 mg into the muscle as needed for anaphylaxis. (Patient not taking: Reported on 10/26/2024)   gabapentin  (NEURONTIN ) 300 MG capsule Take 1 capsule (300 mg total) by  mouth 3 (three) times daily.   HYDROcodone -acetaminophen  (NORCO/VICODIN) 5-325 MG tablet Take 1 tablet by mouth every 6 (six) hours as needed. (Patient not taking: Reported on 10/26/2024)   methocarbamol  (ROBAXIN ) 500 MG tablet Take 1 tablet (500 mg total) by mouth 2 (two) times daily. (Patient not taking: Reported on 10/26/2024)   methocarbamol  (ROBAXIN ) 500 MG tablet Take 1 tablet (500 mg total) by mouth every 8 (eight) hours as needed for muscle  spasms. (Patient not taking: Reported on 10/26/2024)   methocarbamol  (ROBAXIN ) 500 MG tablet Take 1 tablet (500 mg total) by mouth every 6 (six) hours as needed for muscle spasms. (Patient not taking: Reported on 10/26/2024)   mirtazapine  (REMERON ) 7.5 MG tablet Take 1 tablet (7.5 mg total) by mouth at bedtime.   predniSONE  (DELTASONE ) 10 MG tablet Take 4 tablets once a day for 3 days. Take 3 tablets once a day for the next 3 days. Take 2 tablets once a day for the next 3 days. Take 1 tablet once a day for the next 3 days. (Patient not taking: Reported on 10/26/2024)   predniSONE  (STERAPRED UNI-PAK 21 TAB) 10 MG (21) TBPK tablet Take by mouth daily. Take 6 tabs by mouth daily  for 2 days, then 5 tabs for 2 days, then 4 tabs for 2 days, then 3 tabs for 2 days, 2 tabs for 2 days, then 1 tab by mouth daily for 2 days (Patient not taking: Reported on 10/26/2024)   SYMBICORT  80-4.5 MCG/ACT inhaler Inhale 2 puffs into the lungs 2 (two) times daily.   [DISCONTINUED] gabapentin  (NEURONTIN ) 100 MG capsule Take 2 capsules (200 mg total) by mouth 3 (three) times daily. (Patient not taking: Reported on 10/26/2024)   [DISCONTINUED] gabapentin  (NEURONTIN ) 300 MG capsule Take 1 capsule (300 mg total) by mouth 3 (three) times daily. (Patient not taking: Reported on 10/26/2024)   [DISCONTINUED] mirtazapine  (REMERON ) 7.5 MG tablet Take 1 tablet (7.5 mg total) by mouth at bedtime. (Patient not taking: Reported on 10/26/2024)   [DISCONTINUED] SYMBICORT  80-4.5 MCG/ACT inhaler Inhale 2 puffs into the lungs 2 (two) times daily. (Patient not taking: Reported on 10/26/2024)   No facility-administered encounter medications on file as of 10/26/2024.    Past Medical History:  Diagnosis Date   Anxiety    Arthritis    Asthma    Bipolar 1 disorder (HCC)    Bradycardia    Carpal tunnel syndrome 2023   Chronic knee pain    COPD (chronic obstructive pulmonary disease) (HCC)    Depression    Dilated aortic root    DVT (deep venous thrombosis)  (HCC)    Dysrhythmia    H/o blood clots in right leg 2022   History of kidney stones    Hypertension    Insomnia    Medical history non-contributory    Sinus bradycardia    Spinal stenosis    Spinal stenosis    Spinal stenosis 2022   Suicide attempt (HCC)    2010 Intentional overdose attempt with Depakote.    Past Surgical History:  Procedure Laterality Date   COLONOSCOPY WITH PROPOFOL  N/A 03/18/2019   Procedure: COLONOSCOPY WITH PROPOFOL ;  Surgeon: Shaaron Lamar HERO, MD;  Location: [facility];  Service: Endoscopy;  Laterality: N/A;  2:30pm   FOOT SURGERY     had peice of wire removed   IVC FILTER REMOVAL N/A 02/24/2017   Procedure: IVC Filter Removal;  Surgeon: Marea Selinda RAMAN, MD;  Location: [facility] INVASIVE CV LAB;  Service: Cardiovascular;  Laterality: N/A;   MULTIPLE TOOTH EXTRACTIONS     ORIF RADIAL FRACTURE Right 12/05/2020   Procedure: OPEN REDUCTION INTERNAL FIXATION (ORIF) RADIAL FRACTURE and ULNA FRACTURE;  Surgeon: Margrette Taft BRAVO, MD;  Location: [facility] ORS;  Service: Orthopedics;  Laterality: Right;   PERIPHERAL VASCULAR CATHETERIZATION Right 10/22/2016   Procedure: Thrombectomy, thrombolyisis;  Surgeon: Cordella KANDICE Shawl, MD;  Location: [facility] INVASIVE CV LAB;  Service: Cardiovascular;  Laterality: Right;   PERIPHERAL VASCULAR CATHETERIZATION N/A 10/22/2016   Procedure: IVC Filter Insertion;  Surgeon: Cordella KANDICE Shawl, MD;  Location: [facility] INVASIVE CV LAB;  Service: Cardiovascular;  Laterality: N/A;   POLYPECTOMY  03/18/2019   Procedure: POLYPECTOMY;  Surgeon: Shaaron Lamar HERO, MD;  Location: [facility];  Service: Endoscopy;;   TOOTH EXTRACTION N/A 01/02/2024   Procedure: EXTRACTION TEETH NUMBER THREE, FOUR, FIVE, SIX, TEEN, ELEVEN, TWELVE, THIRTEEN, FOURTEEN, THIRTY ONE, ALVEOPLASTY;  Surgeon: Sheryle Hamilton, DMD;  Location: [facility] OR;  Service: Oral Surgery;  Laterality: N/A;    Family History  Problem Relation Age of Onset   Cancer Mother    Heart attack Father    Colon  cancer Neg Hx     Social History   Socioeconomic History   Marital status: Married    Spouse name: Not on file   Number of children: Not on file   Years of education: Not on file   Highest education level: Not on file  Occupational History   Not on file  Tobacco Use   Smoking status: Every Day    Current packs/day: 0.50    Types: Cigarettes   Smokeless tobacco: Never  Vaping Use   Vaping status: Never Used  Substance and Sexual Activity   Alcohol use: Yes    Comment: seldom   Drug use: Not Currently    Types: Marijuana    Comment: smokes for pain. Cocaine + UDS 2023 pt thinks marijuana was laced. Denies cocaine use.   Sexual activity: Yes  Other Topics Concern   Not on file  Social History Narrative   Not on file   Social Drivers of Health   Tobacco Use: High Risk (10/26/2024)   Patient History    Smoking Tobacco Use: Every Day    Smokeless Tobacco Use: Never    Passive Exposure: Not on file  Financial Resource Strain: Not on file  Food Insecurity: Not on file  Transportation Needs: Not on file  Physical Activity: Not on file  Stress: Not on file  Social Connections: Unknown (03/05/2022)   Received [facility]   Social Network    Social Network: Not on file  Intimate Partner Violence: Unknown (01/25/2022)   Received from [facility]   HITS    Physically Hurt: Not on file    Insult or Talk Down To: Not on file    Threaten Physical Harm: Not on file    Scream or Curse: Not on file  Depression (PHQ2-9): High Risk (10/26/2024)   Depression (PHQ2-9)    PHQ-2 Score: 19  Alcohol Screen: Not on file  Housing: Not on file  Utilities: Not on file  Health Literacy: Not on file    Review of Systems  Constitutional: Negative.   HENT: Negative.    Eyes: Negative.   Respiratory: Negative.    Cardiovascular: Negative.   Gastrointestinal:  Positive for heartburn.  Genitourinary: Negative.   Musculoskeletal:  Positive for joint pain and neck pain.  Skin:  Negative.   Neurological:  Positive for headaches.  Endo/Heme/Allergies: Negative.  Psychiatric/Behavioral:  Positive for depression and suicidal ideas.   All other systems reviewed and are negative.     Objective:    BP 110/72   Pulse 69   Ht 5' 11 (1.803 m)   Wt 184 lb 9.6 oz (83.7 kg)   SpO2 99%   BMI 25.75 kg/m   Physical Exam Vitals and nursing note reviewed.  Constitutional:      Appearance: Normal appearance. He is normal weight.  HENT:     Head: Normocephalic and atraumatic.     Right Ear: Tympanic membrane, ear canal and external ear normal.     Left Ear: Tympanic membrane, ear canal and external ear normal.     Nose: Nose normal.     Mouth/Throat:     Mouth: Mucous membranes are moist.     Pharynx: Oropharynx is clear.  Eyes:     Extraocular Movements: Extraocular movements intact.     Right eye: Normal extraocular motion and no nystagmus.     Left eye: Normal extraocular motion and no nystagmus.     Conjunctiva/sclera: Conjunctivae normal.     Pupils: Pupils are equal, round, and reactive to light.  Cardiovascular:     Rate and Rhythm: Normal rate and regular rhythm.     Pulses: Normal pulses.     Heart sounds: Normal heart sounds.  Pulmonary:     Effort: Pulmonary effort is normal.     Breath sounds: Normal breath sounds.  Abdominal:     General: Bowel sounds are normal.     Palpations: Abdomen is soft.  Genitourinary:    Comments: Deferred using shared decision making Musculoskeletal:        General: Normal range of motion.     Cervical back: Normal range of motion and neck supple.     Right lower leg: No edema.     Left lower leg: No edema.  Skin:    General: Skin is warm and dry.     Capillary Refill: Capillary refill takes less than 2 seconds.  Neurological:     General: No focal deficit present.     Mental Status: He is alert. Mental status is at baseline.  Psychiatric:        Mood and Affect: Mood normal.        Speech: Speech normal.         Behavior: Behavior normal.        Thought Content: Thought content normal.        Cognition and Memory: Cognition and memory normal.        Judgment: Judgment normal.          Assessment & Plan:   Problem List Items Addressed This Visit       Cardiovascular and Mediastinum   Chronic deep vein thrombosis (DVT) (HCC)   Relevant Medications   apixaban  (ELIQUIS ) 5 MG TABS tablet     Respiratory   Asthma   Relevant Medications   SYMBICORT  80-4.5 MCG/ACT inhaler     Digestive   Rectal bleeding   Relevant Orders   Ambulatory referral to Gastroenterology   GERD (gastroesophageal reflux disease)   Relevant Orders   Ambulatory referral to Gastroenterology     Nervous and Auditory   Lumbosacral radiculopathy   Relevant Medications   nicotine  (NICODERM CQ  - DOSED IN MG/24 HOURS) 21 mg/24hr patch   mirtazapine  (REMERON ) 7.5 MG tablet   gabapentin  (NEURONTIN ) 300 MG capsule     Other   Bipolar 1 disorder (HCC) (  Chronic)   Tobacco abuse (Chronic)   Relevant Orders   CT CHEST LUNG CA SCREEN LOW DOSE W/O CM   Mixed hyperlipidemia   Relevant Medications   apixaban  (ELIQUIS ) 5 MG TABS tablet   Polysubstance abuse (HCC)   Chronic right shoulder pain   Relevant Medications   mirtazapine  (REMERON ) 7.5 MG tablet   gabapentin  (NEURONTIN ) 300 MG capsule   Insomnia due to other mental disorder   Relevant Medications   mirtazapine  (REMERON ) 7.5 MG tablet   Acute nonintractable headache - Primary   Relevant Medications   mirtazapine  (REMERON ) 7.5 MG tablet   gabapentin  (NEURONTIN ) 300 MG capsule   Other Relevant Orders   Ambulatory referral to Neurology   Physical exam, annual   Relevant Orders   CBC with Differential/Platelet   Comprehensive metabolic panel with GFR   Lipid panel   TSH   Hemoglobin A1c   Generalized anxiety disorder   Relevant Medications   mirtazapine  (REMERON ) 7.5 MG tablet   Depression, recurrent   Relevant Medications   mirtazapine  (REMERON )  7.5 MG tablet   Other Visit Diagnoses       Colon cancer screening       Relevant Orders   Ambulatory referral to Gastroenterology     Screening for thyroid  disorder       Relevant Orders   TSH     Screening for diabetes mellitus       Relevant Orders   Hemoglobin A1c       Assessment and Plan Assessment & Plan Headache with transient vision loss Intermittent severe headache with transient vision changes. Differential includes possible neurological etiology. - Referred to neurology for further evaluation and possible imaging. - Evaluated in ED 08/2024 and CT was negative  Chronic deep vein thrombosis of right femoral vein Chronic DVT in the right leg with three blood clots. Currently on Eliquis  intermittently. - Refilled Eliquis  prescription. - Keep appointments with vascular surgery.  Bipolar disorder with depression and anxiety Bipolar disorder with associated depression and anxiety. Reports daily thoughts of self-harm but denies having a plan. - Refilled mirtazapine  7.5 mg at night. - Refilled gabapentin  300 mg three times a day. - Advised to seek behavioral health urgent care or call [phone] if experiencing active thoughts of self-harm. - Keep appointments with psychiatry.  Polysubstance use disorder including tobacco and alcohol Polysubstance use disorder with current use of alcohol and tobacco. Expresses interest in quitting smoking. - Recommended quitting smoking and provided nicotine  patches. - Advised against excessive alcohol consumption. - Avoid illicit substances.  Asthma Shortness of breath. - Encouraged smoking cessation to improve respiratory symptoms. - Refill Symbicort .  Chronic right shoulder and cervical radicular pain Chronic pain in the right shoulder and cervical region, possibly due to pinched nerves. Previous plan for spine surgery pending MRI of the shoulder. - Await insurance approval for MRI of the shoulder. - Keep appointments with  orthopedics.  Rectal bleeding due to hemorrhoids Intermittent rectal bleeding attributed to hemorrhoids. - Referred to gastroenterology for evaluation and possible colonoscopy.  General Health Maintenance Due for lung cancer screening. Hesitant about receiving vaccines due to fear of needles. - Ordered lung cancer screening. - Discussed vaccine options and addressed concerns about needles.    Return in about 3 months (around 01/24/2025) for chronic follow-up with labs 1 week prior.   Jeoffrey GORMAN Barrio, FNP [facility]     "

## 2024-11-01 ENCOUNTER — Encounter: Payer: Self-pay | Admitting: Gastroenterology

## 2024-11-03 ENCOUNTER — Ambulatory Visit: Admitting: Gastroenterology

## 2024-11-04 ENCOUNTER — Encounter: Payer: Self-pay | Admitting: Gastroenterology

## 2024-11-04 ENCOUNTER — Encounter: Payer: Self-pay | Admitting: Family Medicine

## 2024-11-09 ENCOUNTER — Inpatient Hospital Stay: Admission: RE | Admit: 2024-11-09 | Source: Ambulatory Visit

## 2024-11-11 ENCOUNTER — Telehealth: Payer: Self-pay

## 2024-11-17 ENCOUNTER — Ambulatory Visit (HOSPITAL_COMMUNITY): Attending: Neurosurgery | Admitting: Occupational Therapy

## 2024-11-17 ENCOUNTER — Encounter (HOSPITAL_COMMUNITY): Payer: Self-pay | Admitting: Occupational Therapy

## 2024-11-17 DIAGNOSIS — M25611 Stiffness of right shoulder, not elsewhere classified: Secondary | ICD-10-CM | POA: Diagnosis present

## 2024-11-17 DIAGNOSIS — M25511 Pain in right shoulder: Secondary | ICD-10-CM | POA: Diagnosis present

## 2024-11-17 DIAGNOSIS — G8929 Other chronic pain: Secondary | ICD-10-CM | POA: Diagnosis present

## 2024-11-17 DIAGNOSIS — R29898 Other symptoms and signs involving the musculoskeletal system: Secondary | ICD-10-CM | POA: Diagnosis present

## 2024-11-17 NOTE — Patient Instructions (Signed)

## 2024-11-17 NOTE — Therapy (Signed)
 " OUTPATIENT OCCUPATIONAL THERAPY ORTHO EVALUATION  Patient Name: Lance Cohen MRN: [id number] DOB:[birth year], 54 y.o., male Today's Date: 11/17/2024   END OF SESSION:  OT End of Session - 11/17/24 1425     Visit Number 1    Number of Visits 7    Date for Recertification  12/31/24    Authorization Type [place] Medicaid Well care    Authorization Time Period Requesting 6 visits    Authorization - Visit Number 0    Authorization - Number of Visits 6    OT Start Time 1312    OT Stop Time 1342    OT Time Calculation (min) 30 min    Activity Tolerance Patient tolerated treatment well    Behavior During Therapy WFL for tasks assessed/performed          Past Medical History:  Diagnosis Date   Anxiety    Arthritis    Asthma    Bipolar 1 disorder (HCC)    Bradycardia    Carpal tunnel syndrome 2023   Chronic knee pain    COPD (chronic obstructive pulmonary disease) (HCC)    Depression    Dilated aortic root    DVT (deep venous thrombosis) (HCC)    Dysrhythmia    H/o blood clots in right leg 2022   History of kidney stones    Hypertension    Insomnia    Medical history non-contributory    Sinus bradycardia    Spinal stenosis    Spinal stenosis    Spinal stenosis 2022   Suicide attempt (HCC)    2010 Intentional overdose attempt with Depakote.   Past Surgical History:  Procedure Laterality Date   COLONOSCOPY WITH PROPOFOL  N/A 03/18/2019   Procedure: COLONOSCOPY WITH PROPOFOL ;  Surgeon: Shaaron Lamar HERO, MD;  Location: [facility];  Service: Endoscopy;  Laterality: N/A;  2:30pm   FOOT SURGERY     had peice of wire removed   IVC FILTER REMOVAL N/A 02/24/2017   Procedure: IVC Filter Removal;  Surgeon: Marea Selinda GORMAN, MD;  Location: [facility] INV[facility];  Service: Cardiovascular;  Laterality: N/A;   MULTIPLE TOOTH EXTRACTIONS     ORIF RADIAL FRACTURE Right 12/05/2020   Procedure: OPEN REDUCTION INTERNAL FIXATION (ORIF) RADIAL FRACTURE and ULNA FRACTURE;  Surgeon: Margrette Taft BRAVO, MD;  Location: [facility];  Service: Orthopedics;  Laterality: Right;   PERIPHERAL VASCULAR CATHETERIZATION Right 10/22/2016   Procedure: Thrombectomy, thrombolyisis;  Surgeon: Cordella KANDICE Shawl, MD;  Location: [facility] INVASIVE CV LAB;  Service: Cardiovascular;  Laterality: Right;   PERIPHERAL VASCULAR CATHETERIZATION N/A 10/22/2016   Procedure: IVC Filter Insertion;  Surgeon: Cordella KANDICE Shawl, MD;  Location: [facility];  Service: Cardiovascular;  Laterality: N/A;   POLYPECTOMY  03/18/2019   Procedure: POLYPECTOMY;  Surgeon: Shaaron Lamar HERO, MD;  Location: [facility];  Service: Endoscopy;;   TOOTH EXTRACTION N/A 01/02/2024   Procedure: EXTRACTION TEETH NUMBER THREE, FOUR, FIVE, SIX, TEEN, ELEVEN, TWELVE, THIRTEEN, FOURTEEN, THIRTY ONE, ALVEOPLASTY;  Surgeon: Sheryle Hamilton, DMD;  Location: [facility];  Service: Oral Surgery;  Laterality: N/A;   Patient Active Problem List   Diagnosis Date Noted   Acute nonintractable headache 10/26/2024   Physical exam, annual 10/26/2024   Generalized anxiety disorder 10/26/2024   Depression, recurrent 10/26/2024   Fracture of shaft of right ulna and radius, sequela ORIF 06/29/21 04/13/2024   Insomnia due to other mental disorder 02/24/2024   Degeneration of lumbosacral intervertebral disc 11/12/2023   Chronic right  shoulder pain 10/13/2023   Hemorrhoids 08/14/2023   Encounter for general adult medical examination with abnormal findings 08/14/2023   Mixed hyperlipidemia 08/14/2023   Lumbosacral radiculopathy 08/14/2023   Polysubstance abuse (HCC) 08/14/2023   Seizure (HCC) 11/27/2020   Myofascial pain dysfunction syndrome 11/22/2020   Cocaine abuse (HCC) 11/18/2020   Foreign body (FB) in soft tissue 09/08/2020   Chronic low back pain 05/11/2020   Cervical radiculopathy 02/18/2020   Impingement syndrome of right shoulder 01/18/2020   GERD (gastroesophageal reflux disease) 06/29/2019   Rectal bleeding 02/22/2019   Chest pain 10/19/2017    Asthma 10/19/2017   Hypertension 10/19/2017   Arthralgia of right knee 10/19/2017   History of pulmonary embolus (PE) 10/23/2016   Nausea and vomiting 10/23/2016   S/P IVC filter 10/23/2016   Chronic deep vein thrombosis (DVT) (HCC) 10/18/2016   Bipolar 1 disorder (HCC) 06/24/2011   Tobacco abuse 06/24/2011   CARPAL TUNNEL SYNDROME, RIGHT 06/21/2010    PCP: Kayla Gauze, FNP REFERRING PROVIDER: Gillie Duncans, MD  ONSET DATE: ~2 years  REFERRING DIAG: M25.511 (ICD-10-CM) - Pain in right shoulder   THERAPY DIAG:  Chronic right shoulder pain - Plan: Ot plan of care cert/re-cert  Shoulder stiffness, right - Plan: Ot plan of care cert/re-cert  Other symptoms and signs involving the musculoskeletal system - Plan: Ot plan of care cert/re-cert  Rationale for Evaluation and Treatment: Rehabilitation  SUBJECTIVE:   SUBJECTIVE STATEMENT: I have 2 pinched nerves in my neck Pt accompanied by: self  PERTINENT HISTORY: Pt has had shoulder pain for 2+ years. X-ray indicates 2 cervical nerve compressions. MRI for shoulder has been ordered.  PRECAUTIONS: Back and Fall  WEIGHT BEARING RESTRICTIONS: No  PAIN:  Are you having pain? Yes: NPRS scale: 9/10 Pain location: entire shoulder girdle down through biceps Pain description: aching, stabbing Aggravating factors: cold Relieving factors: heat  FALLS: Has patient fallen in last 6 months? No  PLOF: Independent  PATIENT GOALS: To decrease pain  NEXT MD VISIT: none scheduled  OBJECTIVE:   HAND DOMINANCE: Right  ADLs: Overall ADLs: Pt reports difficulty reaching over head and behind back, he requires assist with dressing, unable to wash his back side in the shower, unable to pull covers up on him. Pt reports difficulty with lifting and carrying which limits his ability to do any cooking, cleaning, and yard work tasks.   FUNCTIONAL OUTCOME MEASURES: Quick Dash: 70.45  UPPER EXTREMITY ROM:       Assessed in seated, er/IR  adducted  Active ROM Right eval  Shoulder flexion 148  Shoulder abduction 107  Shoulder internal rotation 90  Shoulder external rotation 53  (Blank rows = not tested)    UPPER EXTREMITY MMT:     Assessed in seated, er/IR adducted  MMT Right eval  Shoulder flexion 4/5  Shoulder abduction 4/5  Shoulder internal rotation 4+/5  Shoulder external rotation 4/5  (Blank rows = not tested)  11/17/24: All motions with pain against resistance  SENSATION: Pt reports numbness from shoulder down to fingers  EDEMA: No swelling noted  OBSERVATIONS: Slight inconsistencies between functional mobility and measured mobility observed   TODAY'S TREATMENT:  DATE:   2024/11/22 AA/ROM: supine, flexion, abduction, er/Ir, horizontal abduction, protraction, x10    PATIENT EDUCATION: Education details: AA/ROM Person educated: Patient Education method: Programmer, Multimedia, Facilities Manager, and Handouts Education comprehension: verbalized understanding and returned demonstration  HOME EXERCISE PROGRAM: Nov 22, 2024: AA/ROM  GOALS: Goals reviewed with patient? Yes   SHORT TERM GOALS: Target date: 01/02/25  Pt will be provided with and educated on HEP to improve mobility in RUE required for use during ADL completion.   Goal status: INITIAL  LONG TERM GOALS: Target date: 01/02/25  Pt will decrease pain in RUE to 3/10 or less to improve ability to sleep for 2+ consecutive hours without waking due to pain.   Goal status: INITIAL  2.  Pt will decrease RUE fascial restrictions to min amounts or less to improve mobility required for functional reaching tasks.   Goal status: INITIAL  3.  Pt will increase RUE A/ROM by 15 degrees to improve ability to use RUE when reaching overhead or behind back during dressing and bathing tasks.   Goal status: INITIAL  4.  Pt will increase RUE  strength to 5/5 or greater to improve ability to use RUE when lifting or carrying items during meal preparation/housework/yardwork tasks.   Goal status: INITIAL  5.  Pt will return to highest level of function using RUE as dominant during functional task completion.   Goal status: INITIAL   ASSESSMENT:  CLINICAL IMPRESSION: Patient is a 54 y.o. male who was seen today for occupational therapy evaluation for R shoulder pain. Pt presents with increased pain and fascial restrictions, decreased ROM, strength, and functional use of the RUE.   PERFORMANCE DEFICITS: in functional skills including in functional skills including ADLs, IADLs, coordination, tone, ROM, strength, pain, fascial restrictions, muscle spasms, and UE functional use.  IMPAIRMENTS: are limiting patient from ADLs, IADLs, rest and sleep, work, leisure, and social participation.   COMORBIDITIES: may have co-morbidities  that affects occupational performance. Patient will benefit from skilled OT to address above impairments and improve overall function.  MODIFICATION OR ASSISTANCE TO COMPLETE EVALUATION: Min-Moderate modification of tasks or assist with assess necessary to complete an evaluation.  OT OCCUPATIONAL PROFILE AND HISTORY: Detailed assessment: Review of records and additional review of physical, cognitive, psychosocial history related to current functional performance.  CLINICAL DECISION MAKING: Moderate - several treatment options, min-mod task modification necessary  REHAB POTENTIAL: Good  EVALUATION COMPLEXITY: Moderate      PLAN:  OT FREQUENCY: 1-2x/week  OT DURATION: 6 weeks  PLANNED INTERVENTIONS: 97168 OT Re-evaluation, 97535 self care/ADL training, 02889 therapeutic exercise, 97530 therapeutic activity, 97112 neuromuscular re-education, 97140 manual therapy, 97035 ultrasound, 97010 moist heat, 97032 electrical stimulation (manual), passive range of motion, functional mobility training, energy  conservation, coping strategies training, patient/family education, and DME and/or AE instructions  RECOMMENDED OTHER SERVICES: Orthopedic  CONSULTED AND AGREED WITH PLAN OF CARE: Patient  PLAN FOR NEXT SESSION: AA/ROM, stretches, A/Rom, wall washes, Isometrics  Valentin Thelbert [facility] [phone] Valentin Jillyn Thelbert, OT 11-22-2024, 2:26 PM   Managed Medicaid Authorization Request Treatment Start Date: 2024/11/22  Visit Dx Codes: M25.511, M25.611, R29.898  Functional Tool Score: Annitta: 70.45  For all possible CPT codes, reference the Planned Interventions line above.     Check all conditions that are expected to impact treatment: {Conditions expected to impact treatment:None of these apply   If treatment provided at initial evaluation, no treatment charged due to lack of authorization.     "

## 2024-11-18 ENCOUNTER — Encounter: Payer: Self-pay | Admitting: Emergency Medicine

## 2024-11-19 NOTE — Telephone Encounter (Signed)
 Please see previous encounter.

## 2024-11-22 ENCOUNTER — Other Ambulatory Visit (HOSPITAL_COMMUNITY): Payer: Self-pay

## 2024-12-09 ENCOUNTER — Ambulatory Visit (HOSPITAL_COMMUNITY): Admitting: Occupational Therapy

## 2024-12-16 ENCOUNTER — Ambulatory Visit (HOSPITAL_COMMUNITY): Admitting: Occupational Therapy

## 2024-12-23 ENCOUNTER — Ambulatory Visit (HOSPITAL_COMMUNITY): Admitting: Occupational Therapy

## 2024-12-27 ENCOUNTER — Ambulatory Visit: Admitting: Gastroenterology

## 2024-12-30 ENCOUNTER — Ambulatory Visit (HOSPITAL_COMMUNITY): Admitting: Occupational Therapy

## 2025-01-04 ENCOUNTER — Encounter: Admitting: Family Medicine

## 2025-01-06 ENCOUNTER — Ambulatory Visit (HOSPITAL_COMMUNITY): Admitting: Occupational Therapy

## 2025-01-13 ENCOUNTER — Ambulatory Visit (HOSPITAL_COMMUNITY): Admitting: Occupational Therapy

## 2025-02-17 ENCOUNTER — Institutional Professional Consult (permissible substitution): Admitting: Neurology
